# Patient Record
Sex: Female | Born: 1970 | Race: White | Hispanic: No | Smoking: Former smoker
Health system: Southern US, Community
[De-identification: ages and names within clinical notes are randomized; demographics above are authoritative.]

## PROBLEM LIST (undated history)

## (undated) DIAGNOSIS — K219 Gastro-esophageal reflux disease without esophagitis: Secondary | ICD-10-CM

## (undated) DIAGNOSIS — F3181 Bipolar II disorder: Secondary | ICD-10-CM

## (undated) DIAGNOSIS — F32A Depression, unspecified: Secondary | ICD-10-CM

## (undated) DIAGNOSIS — I493 Ventricular premature depolarization: Secondary | ICD-10-CM

## (undated) DIAGNOSIS — J45909 Unspecified asthma, uncomplicated: Secondary | ICD-10-CM

## (undated) DIAGNOSIS — F419 Anxiety disorder, unspecified: Secondary | ICD-10-CM

## (undated) DIAGNOSIS — R002 Palpitations: Secondary | ICD-10-CM

## (undated) DIAGNOSIS — F329 Major depressive disorder, single episode, unspecified: Secondary | ICD-10-CM

## (undated) HISTORY — DX: Unspecified asthma, uncomplicated: J45.909

## (undated) HISTORY — PX: ABDOMINAL HYSTERECTOMY: SHX81

## (undated) HISTORY — DX: Bipolar II disorder: F31.81

## (undated) HISTORY — PX: TOTAL ABDOMINAL HYSTERECTOMY: SHX209

## (undated) HISTORY — DX: Gastro-esophageal reflux disease without esophagitis: K21.9

## (undated) HISTORY — PX: APPENDECTOMY: SHX54

## (undated) HISTORY — DX: Palpitations: R00.2

---

## 2012-04-13 ENCOUNTER — Emergency Department (HOSPITAL_COMMUNITY): Payer: 59

## 2012-04-13 ENCOUNTER — Other Ambulatory Visit: Payer: Self-pay

## 2012-04-13 ENCOUNTER — Emergency Department (HOSPITAL_COMMUNITY)
Admission: EM | Admit: 2012-04-13 | Discharge: 2012-04-13 | Disposition: A | Discharge status: Home / Self Care | Payer: 59 | Attending: Emergency Medicine | Admitting: Emergency Medicine

## 2012-04-13 ENCOUNTER — Encounter (HOSPITAL_COMMUNITY): Payer: Self-pay | Admitting: Emergency Medicine

## 2012-04-13 DIAGNOSIS — I10 Essential (primary) hypertension: Secondary | ICD-10-CM | POA: Insufficient documentation

## 2012-04-13 DIAGNOSIS — F3289 Other specified depressive episodes: Secondary | ICD-10-CM | POA: Insufficient documentation

## 2012-04-13 DIAGNOSIS — F419 Anxiety disorder, unspecified: Secondary | ICD-10-CM

## 2012-04-13 DIAGNOSIS — F329 Major depressive disorder, single episode, unspecified: Secondary | ICD-10-CM | POA: Insufficient documentation

## 2012-04-13 DIAGNOSIS — F411 Generalized anxiety disorder: Secondary | ICD-10-CM | POA: Insufficient documentation

## 2012-04-13 DIAGNOSIS — R0789 Other chest pain: Secondary | ICD-10-CM | POA: Insufficient documentation

## 2012-04-13 HISTORY — DX: Anxiety disorder, unspecified: F41.9

## 2012-04-13 HISTORY — DX: Depression, unspecified: F32.A

## 2012-04-13 HISTORY — DX: Major depressive disorder, single episode, unspecified: F32.9

## 2012-04-13 HISTORY — DX: Ventricular premature depolarization: I49.3

## 2012-04-13 LAB — URINALYSIS, ROUTINE W REFLEX MICROSCOPIC
Bilirubin Urine: NEGATIVE
Glucose, UA: NEGATIVE mg/dL
Hgb urine dipstick: NEGATIVE
Ketones, ur: NEGATIVE mg/dL
Leukocytes, UA: NEGATIVE
Nitrite: NEGATIVE
Protein, ur: NEGATIVE mg/dL
Specific Gravity, Urine: 1.015 (ref 1.005–1.030)
Urobilinogen, UA: 0.2 mg/dL (ref 0.0–1.0)
pH: 6.5 (ref 5.0–8.0)

## 2012-04-13 LAB — CBC WITH DIFFERENTIAL/PLATELET
Basophils Absolute: 0 10*3/uL (ref 0.0–0.1)
Basophils Relative: 0 % (ref 0–1)
Eosinophils Absolute: 0.1 10*3/uL (ref 0.0–0.7)
Eosinophils Relative: 1 % (ref 0–5)
HCT: 37.6 % (ref 36.0–46.0)
Hemoglobin: 13.2 g/dL (ref 12.0–15.0)
Lymphocytes Relative: 24 % (ref 12–46)
Lymphs Abs: 1.7 10*3/uL (ref 0.7–4.0)
MCH: 28.7 pg (ref 26.0–34.0)
MCHC: 35.1 g/dL (ref 30.0–36.0)
MCV: 81.7 fL (ref 78.0–100.0)
Monocytes Absolute: 0.5 10*3/uL (ref 0.1–1.0)
Monocytes Relative: 6 % (ref 3–12)
Neutro Abs: 4.8 10*3/uL (ref 1.7–7.7)
Neutrophils Relative %: 69 % (ref 43–77)
Platelets: 221 10*3/uL (ref 150–400)
RBC: 4.6 MIL/uL (ref 3.87–5.11)
RDW: 14.4 % (ref 11.5–15.5)
WBC: 7.1 10*3/uL (ref 4.0–10.5)

## 2012-04-13 LAB — BASIC METABOLIC PANEL
BUN: 6 mg/dL (ref 6–23)
CO2: 22 mEq/L (ref 19–32)
Calcium: 9.1 mg/dL (ref 8.4–10.5)
Chloride: 103 mEq/L (ref 96–112)
Creatinine, Ser: 0.76 mg/dL (ref 0.50–1.10)
GFR calc Af Amer: >90 mL/min (ref >90)
GFR calc non Af Amer: >90 mL/min (ref >90)
Glucose, Bld: 87 mg/dL (ref 70–99)
Potassium: 3.4 mEq/L — ABNORMAL LOW (ref 3.5–5.1)
Sodium: 135 mEq/L (ref 135–145)

## 2012-04-13 LAB — TROPONIN I: Troponin I: <0.3 ng/mL (ref <0.30)

## 2012-04-13 MED ORDER — LORAZEPAM 1 MG PO TABS
1.0000 mg | ORAL_TABLET | Freq: Once | ORAL | Status: AC
  Administered 2012-04-13: 1 mg via ORAL
  Filled 2012-04-13: qty 1

## 2012-04-13 MED ORDER — LORAZEPAM 1 MG PO TABS: ORAL_TABLET | ORAL | Status: DC

## 2012-04-13 NOTE — ED Notes (Signed)
Pt c/o left chest pain x one hour ago. Pt seen PcP for the same today.

## 2012-04-13 NOTE — ED Provider Notes (Signed)
History     CSN: [id number]  Arrival date & time 04/13/12  1744   First MD Initiated Contact with Patient 04/13/12 1827      Chief Complaint  Patient presents with  . Chest Pain     HPI Pt was seen at 1850.  Per pt, c/o gradual onset and persistence of constant acute flair of her chronic anxiety for the past 3 weeks, worse over the past several hours.  Pt states she was eval by her PMD in the ofc today for anxiety/depression due to multiple recent stressors in the past 3 weeks (work, death of family member).  States she was told her BP was "elevated" ("170's/90's"), and was restarted on anti-depressant medications (self d/c'd her previous meds for same approx 1 year ago).  Pt states she also "was patted on the head and told I was ok."  Pt states this "aggravated" her and she became "more upset" and tearful after she left the office.  States she developed chest pain "thinking about all that and the fact he told me my BP was high."  Denies palpitations, no cough/SOB, no abd pain, no N/V/D, no back pain, no fevers, no injury.      Past Medical History  Diagnosis Date  . Anxiety   . Depression   . PVC (premature ventricular contraction)     Past Surgical History  Procedure Date  . Appendectomy   . Abdominal hysterectomy     History  Substance Use Topics  . Smoking status: Not on file  . Smokeless tobacco: Not on file  . Alcohol Use: No    Review of Systems ROS: Statement: All systems negative except as marked or noted in the HPI; Constitutional: Negative for fever and chills. ; ; Eyes: Negative for eye pain, redness and discharge. ; ; ENMT: Negative for ear pain, hoarseness, nasal congestion, sinus pressure and sore throat. ; ; Cardiovascular: +CP. Negative for palpitations, diaphoresis, dyspnea and peripheral edema. ; ; Respiratory: Negative for cough, wheezing and stridor. ; ; Gastrointestinal: Negative for nausea, vomiting, diarrhea, abdominal pain, blood in stool, hematemesis,  jaundice and rectal bleeding. . ; ; Genitourinary: Negative for dysuria, flank pain and hematuria. ; ; Musculoskeletal: Negative for back pain and neck pain. Negative for swelling and trauma.; ; Skin: Negative for pruritus, rash, abrasions, blisters, bruising and skin lesion.; ; Neuro: Negative for headache, lightheadedness and neck stiffness. Negative for weakness, altered level of consciousness , altered mental status, extremity weakness, paresthesias, involuntary movement, seizure and syncope.; Psych:  +anxiety, depression. No SI, no SA, no HI, no hallucinations.       Allergies  Erythromycin and Tetracyclines & related  Home Medications   Current Outpatient Rx  Name Route Sig Dispense Refill  . ARIPIPRAZOLE 2 MG PO TABS Oral Take 2 mg by mouth daily.    Marland Kitchen FLUOXETINE HCL 40 MG PO CAPS Oral Take 40 mg by mouth daily.    Marland Kitchen HYDROXYCHLOROQUINE SULFATE 200 MG PO TABS Oral Take 400 mg by mouth daily.    Marland Kitchen RANITIDINE HCL 150 MG PO CAPS Oral Take 150 mg by mouth daily.    Marland Kitchen LORAZEPAM 1 MG PO TABS  One Half to 1 tablet PO BID prn anxiety 6 tablet 0    BP 175/87  Pulse 98  Temp 98.6 F (37 C) (Oral)  Resp 22  Ht 5\' 4"  (1.626 m)  Wt 194 lb (87.998 kg)  BMI 33.30 kg/m2  SpO2 100%  Physical Exam 1855: Physical examination:  Nursing notes reviewed; Vital signs and O2 SAT reviewed;  Constitutional: Well developed, Well nourished, Well hydrated, In no acute distress; Head:  Normocephalic, atraumatic; Eyes: EOMI, PERRL, No scleral icterus; ENMT: Mouth and pharynx normal, Mucous membranes moist; Neck: Supple, Full range of motion, No lymphadenopathy; Cardiovascular: Regular rate and rhythm, No murmur, rub, or gallop; Respiratory: Breath sounds clear & equal bilaterally, No rales, rhonchi, wheezes.  Speaking full sentences with ease, Normal respiratory effort/excursion; Chest: Nontender, Movement normal; Abdomen: Soft, Nontender, Nondistended, Normal bowel sounds; Extremities: Pulses normal, No  tenderness, No edema, No calf edema or asymmetry.; Neuro: AA&Ox3, Major CN grossly intact.  Speech clear. No gross focal motor or sensory deficits in extremities.; Skin: Color normal, Warm, Dry.; Psych:  Anxious, tearful/crying.    ED Course  Procedures    MDM  MDM Reviewed: nursing note and vitals Interpretation: labs, x-ray and ECG      Date: 04/13/2012  Rate: 96  Rhythm: normal sinus rhythm  QRS Axis: normal  Intervals: normal  ST/T Wave abnormalities: normal  Conduction Disutrbances:none  Narrative Interpretation:   Old EKG Reviewed: none available.  Results for orders placed during the hospital encounter of 04/13/12  TROPONIN I      Component Value Range   Troponin I <0.30  <0.30 ng/mL  BASIC METABOLIC PANEL      Component Value Range   Sodium 135  135 - 145 mEq/L   Potassium 3.4 (*) 3.5 - 5.1 mEq/L   Chloride 103  96 - 112 mEq/L   CO2 22  19 - 32 mEq/L   Glucose, Bld 87  70 - 99 mg/dL   BUN 6  6 - 23 mg/dL   Creatinine, Ser 1.61  0.50 - 1.10 mg/dL   Calcium 9.1  8.4 - 09.6 mg/dL   GFR calc non Af Amer >90  >90 mL/min   GFR calc Af Amer >90  >90 mL/min  CBC WITH DIFFERENTIAL      Component Value Range   WBC 7.1  4.0 - 10.5 K/uL   RBC 4.60  3.87 - 5.11 MIL/uL   Hemoglobin 13.2  12.0 - 15.0 g/dL   HCT 04.5  40.9 - 81.1 %   MCV 81.7  78.0 - 100.0 fL   MCH 28.7  26.0 - 34.0 pg   MCHC 35.1  30.0 - 36.0 g/dL   RDW 91.4  78.2 - 95.6 %   Platelets 221  150 - 400 K/uL   Neutrophils Relative 69  43 - 77 %   Neutro Abs 4.8  1.7 - 7.7 K/uL   Lymphocytes Relative 24  12 - 46 %   Lymphs Abs 1.7  0.7 - 4.0 K/uL   Monocytes Relative 6  3 - 12 %   Monocytes Absolute 0.5  0.1 - 1.0 K/uL   Eosinophils Relative 1  0 - 5 %   Eosinophils Absolute 0.1  0.0 - 0.7 K/uL   Basophils Relative 0  0 - 1 %   Basophils Absolute 0.0  0.0 - 0.1 K/uL  URINALYSIS, ROUTINE W REFLEX MICROSCOPIC      Component Value Range   Color, Urine YELLOW  YELLOW   APPearance CLEAR  CLEAR    Specific Gravity, Urine 1.015  1.005 - 1.030   pH 6.5  5.0 - 8.0   Glucose, UA NEGATIVE  NEGATIVE mg/dL   Hgb urine dipstick NEGATIVE  NEGATIVE   Bilirubin Urine NEGATIVE  NEGATIVE   Ketones, ur NEGATIVE  NEGATIVE mg/dL  Protein, ur NEGATIVE  NEGATIVE mg/dL   Urobilinogen, UA 0.2  0.0 - 1.0 mg/dL   Nitrite NEGATIVE  NEGATIVE   Leukocytes, UA NEGATIVE  NEGATIVE   Dg Chest 2 View 04/13/2012  *RADIOLOGY REPORT*  Clinical Data: 41 year old female chest pain.  CHEST - 2 VIEW  Comparison: None.  Findings: Normal lung volumes. Normal cardiac size and mediastinal contours.  Visualized tracheal air column is within normal limits. Lungs are clear.  No pneumothorax or pleural effusion. No acute osseous abnormality identified.  IMPRESSION: Negative, no acute cardiopulmonary abnormality.   Original Report Authenticated By: Harley Hallmark, M.D.       2100:  Feels improved after ativan and wants to go home now.  No longer tearful and crying.  PERC negative.  Doubt ACS or PE as cause for symptoms; as symptoms seem temporally related to "getting upset" and crying.  Long d/w pt and family regarding dx of HTN and its management; verb understanding. Dx testing d/w pt and family.  Questions answered.  Verb understanding, agreeable to d/c home with outpt f/u.         Laray Anger, DO 04/16/12 1128

## 2012-04-13 NOTE — ED Notes (Signed)
Pt alert & oriented x4, stable gait. Patient given discharge instructions, paperwork & prescription(s). Patient  instructed to stop at the registration desk to finish any additional paperwork. Patient verbalized understanding. Pt left department w/ no further questions. 

## 2012-04-13 NOTE — ED Notes (Signed)
Pt presents to ed with c/o left side chest pain, pt states that she was seen in pcp office today for follow up with anxiety and depression, pt states that she used to be on anxiety medication, stopped taking it about a year and half ago, began to experience symptoms of anxiety and depression about 3 weeks ago due to stress at work and recent death of an uncle last week, states that she is upset because she knows her mother and aunts will be next. Pt states that her  Blood pressure was elevated in dr office today and dr. Catalina Pizza her that she would be fine, after leaving the dr. Isidore Moos pt began to experience left side chest pain, anxiety due to elevated blood pressure and drove herself to er.

## 2012-10-22 ENCOUNTER — Emergency Department: Payer: Self-pay | Admitting: Emergency Medicine

## 2012-10-22 LAB — URINALYSIS, COMPLETE
Bilirubin,UR: NEGATIVE
Blood: NEGATIVE
Glucose,UR: NEGATIVE mg/dL (ref 0–75)
Ketone: NEGATIVE
Leukocyte Esterase: NEGATIVE
Nitrite: NEGATIVE
Ph: 6 (ref 4.5–8.0)
Protein: NEGATIVE
RBC,UR: 1 /HPF (ref 0–5)
Specific Gravity: 1.014 (ref 1.003–1.030)
Squamous Epithelial: NONE SEEN
WBC UR: 1 /HPF (ref 0–5)

## 2013-05-10 ENCOUNTER — Ambulatory Visit: Payer: Self-pay | Admitting: Podiatry

## 2013-08-03 ENCOUNTER — Emergency Department: Payer: Self-pay | Admitting: Emergency Medicine

## 2013-08-03 LAB — URINALYSIS, COMPLETE
Bilirubin,UR: NEGATIVE
Blood: NEGATIVE
Glucose,UR: NEGATIVE mg/dL (ref 0–75)
Ketone: NEGATIVE
Nitrite: NEGATIVE
Ph: 6 (ref 4.5–8.0)
Protein: NEGATIVE
RBC,UR: 4 /HPF (ref 0–5)
Specific Gravity: 1.009 (ref 1.003–1.030)
Squamous Epithelial: 1
WBC UR: 198 /HPF (ref 0–5)

## 2013-11-13 ENCOUNTER — Ambulatory Visit: Payer: Self-pay

## 2015-02-04 ENCOUNTER — Telehealth: Payer: Self-pay

## 2015-02-04 NOTE — Telephone Encounter (Signed)
Patient called stating that her insurance is now requiring a prior authorization for her Latuda medication. Practice partner number is [phone]. Would you like to change the medication to something else?

## 2015-02-04 NOTE — Telephone Encounter (Signed)
We will need to do the prior auth as she has been on many medications

## 2015-02-06 NOTE — Telephone Encounter (Signed)
Called and let patient know we were starting the prior auth.

## 2015-02-21 ENCOUNTER — Telehealth: Payer: Self-pay | Admitting: Unknown Physician Specialty

## 2015-02-21 NOTE — Telephone Encounter (Signed)
Disregard

## 2015-03-04 ENCOUNTER — Telehealth: Payer: Self-pay | Admitting: Unknown Physician Specialty

## 2015-03-05 NOTE — Telephone Encounter (Signed)
disregard

## 2015-05-26 ENCOUNTER — Encounter: Payer: Self-pay | Admitting: Unknown Physician Specialty

## 2015-05-26 DIAGNOSIS — R002 Palpitations: Secondary | ICD-10-CM | POA: Insufficient documentation

## 2015-05-26 DIAGNOSIS — J45909 Unspecified asthma, uncomplicated: Secondary | ICD-10-CM | POA: Insufficient documentation

## 2015-05-26 DIAGNOSIS — G4700 Insomnia, unspecified: Secondary | ICD-10-CM

## 2015-05-26 DIAGNOSIS — F3181 Bipolar II disorder: Secondary | ICD-10-CM

## 2015-05-26 DIAGNOSIS — F419 Anxiety disorder, unspecified: Secondary | ICD-10-CM | POA: Insufficient documentation

## 2015-05-26 DIAGNOSIS — E559 Vitamin D deficiency, unspecified: Secondary | ICD-10-CM

## 2015-05-27 ENCOUNTER — Encounter: Payer: Self-pay | Admitting: Unknown Physician Specialty

## 2015-05-27 ENCOUNTER — Ambulatory Visit (INDEPENDENT_AMBULATORY_CARE_PROVIDER_SITE_OTHER): Payer: Managed Care, Other (non HMO) | Admitting: Unknown Physician Specialty

## 2015-05-27 VITALS — BP 118/67 | HR 76 | Temp 98.3°F | Ht 63.2 in | Wt 236.0 lb

## 2015-05-27 DIAGNOSIS — F3181 Bipolar II disorder: Secondary | ICD-10-CM

## 2015-05-27 DIAGNOSIS — E669 Obesity, unspecified: Secondary | ICD-10-CM | POA: Diagnosis not present

## 2015-05-27 DIAGNOSIS — E559 Vitamin D deficiency, unspecified: Secondary | ICD-10-CM

## 2015-05-27 DIAGNOSIS — M25511 Pain in right shoulder: Secondary | ICD-10-CM

## 2015-05-27 DIAGNOSIS — Z0000 Encounter for general adult medical examination without abnormal findings: Secondary | ICD-10-CM | POA: Diagnosis not present

## 2015-05-27 DIAGNOSIS — M25512 Pain in left shoulder: Secondary | ICD-10-CM | POA: Diagnosis not present

## 2015-05-27 MED ORDER — LURASIDONE HCL 20 MG PO TABS: 20.0000 mg | ORAL_TABLET | Freq: Every day | ORAL | Status: DC

## 2015-05-27 MED ORDER — FLUOXETINE HCL 40 MG PO CAPS: 40.0000 mg | ORAL_CAPSULE | Freq: Every day | ORAL | Status: DC

## 2015-05-27 MED ORDER — BUPROPION HCL ER (XL) 300 MG PO TB24: 300.0000 mg | ORAL_TABLET | Freq: Every day | ORAL | Status: DC

## 2015-05-27 MED ORDER — LORAZEPAM 1 MG PO TABS: ORAL_TABLET | ORAL | Status: DC

## 2015-05-27 MED ORDER — METOPROLOL SUCCINATE ER 50 MG PO TB24: 50.0000 mg | ORAL_TABLET | Freq: Every day | ORAL | Status: DC

## 2015-05-27 NOTE — Assessment & Plan Note (Signed)
Improving.  Increase Vit D

## 2015-05-27 NOTE — Patient Instructions (Signed)
Please do call to schedule your mammogram; the number to schedule one at either [facility] or [facility] is [phone]    Think you're too busy to work out? We have the workout for you. In minutes, high-intensity interval training (H.I.I.T.) will have you sweating, breathing hard and maximizing the health benefits of exercise without the time commitment. Best of all, it's scientifically proven to work.  What Is H.I.I.T.? SHORT WORKOUTS 101 High-intensity interval training - referred to as H.I.I.T. - is based on the idea that short bursts of strenuous exercise can have a big impact on the body. If moderate exercise - like a 20-minute jog - is good for your heart, lungs and metabolism, H.I.I.T. packs the benefits of that workout and more into a few minutes. It may sound too good to be true, but learning this exercise technique and adapting it to your life can mean saving hours at the gym. If you think you don't have time to exercise, H.I.I.T. may be the workout for you.  You can try it with any aerobic activity you like. The principles of H.I.I.T. can be applied to running, biking, stair climbing, swimming, jumping rope, rowing, even hopping or skipping. (Yes, skipping!)  The downside? Even though H.I.I.T. lasts only minutes, the workouts are tough, requiring you to push your body near its limit.  HOW INTENSE IS HIGH INTENSITY? High-intensity exercise is obviously not a casual stroll down the street, but it's not a run-till-your-lungs-pop explosion, either. Think breathless, not winded. Heart-pounding, not exploding. Legs pumping, but not uncontrolled.  You don't need any fancy heart rate monitors to do these workouts. Use cues from your body as a guide. In the middle of a high-intensity workout you should be able to say single words, but not complete whole sentences. So, if you can keep chatting to your workout partner during this workout, pump it up a few  notches.  05-07-29 Training This simple program will help you make the most of a short workout by improving heart health and endurance. Try it with your favorite cardiovascular activity. The essentials of 05-07-29 training are simple. Run, ride or perhaps row on a rowing machine gently for 30 seconds, accelerate to a moderate pace for 20 seconds, then sprint as hard as you can for 10 seconds. (It should be called 30-20-10 training, obviously, but that is not as catchy.) Repeat.  You don't even need a stopwatch to monitor the 30-, 20-, and 10-second time changes. You can just count to yourself, which seems to make the intervals pass more quickly.  Best of all? The grueling, all-out portion of the workout lasts for only 10 seconds. C'mon, you can do anything for 10 seconds, right?  Got 10 Minutes? A solitary minute of hard work buried in 10 minutes of activity can make a big difference.  The 10-Minute Workout If you like to run, bike, row or swim - just a little bit - this workout is a great option for you. Step 1 Warm up for 2 minutes Step 2 Pedal, run or swim all-out for 20 seconds. Repeat 2 more times Warm down for 3 minutes    GET STARTED To benefit the most from really, really short workouts, you need to build the habit of doing them into your hectic life. Ideally, you'll complete the workout three times a week. The best way to build that habit is to start small and be willing to tweak your schedule where you can to accommodate  your new workout.  First set up a spot in your house for your workout, equipped with whatever you need to get the job done: sneakers, a chair, a towel, etc. Then slot your workout in before you would normally shower. (You can even do it in the bathroom.) Or wake up five minutes earlier and do it first thing in the morning, so you can head off to work feeling accomplished. Or do it during your lunch hour. Run up your office's stairs or grab a private conference room  for just a few minutes. Or work it into your commute. If you walk or bike to work, add some heavy intervals on the way home.  GET A BOOST FROM MUSIC Creating a workout playlist of high-energy tunes you love will not make your workout feel easier, but it may cause you to exercise harder without even realizing it. Best of all, if you are doing a really short workout, you need only one or two great tunes to get you through. If you are willing to try something a bit different, make your own music as you exercise. Sing, hum, clap your hands, whatever you can do to jam along to your playlist. It may give you an extra boost to finish strong.  Find a song or podcast that's the length of your really, really short workout. By the time the song is over, you're done.  Excerpted from the [place] [employer] Well column http://www.nytimes.com/well/guides/really-really-short-workouts?smid=fb-nytwell&smtyp=pay

## 2015-05-27 NOTE — Assessment & Plan Note (Signed)
Stable.  [facility]

## 2015-05-27 NOTE — Assessment & Plan Note (Addendum)
Discussed Exercise and handout given for HITT training.  She will try to give up cookies.

## 2015-05-27 NOTE — Progress Notes (Signed)
-----  BP 118/67 mmHg  Pulse 76  Temp(Src) 98.3 F (36.8 C)  Ht 5' 3.2" (1.605 m)  Wt 236 lb (107.049 kg)  BMI 41.56 kg/m2  SpO2 96%  LMP  (LMP Unknown)   Subjective:    Patient ID: Tracie James, female    DOB: 13-May-1971, 44 y.o.   MRN: [id number]  HPI: Tracie James is a 44 y.o. female  Chief Complaint  Patient presents with  . Annual Exam   Bipolar depression Pt needs prior authorization for Latuda.  Pt has tried a number of therapies in the past.  This seems to have worked the best.  She has tried and failed Cymbalta, Zoloft, Lithium, Abilify, Seroquel..  She is currently taking Wellbutrin along with the Latuda and Prozac.  She has 2 1/2 weeks and needs more.    Relevant past medical, surgical, family and social history reviewed and updated as indicated. Interim medical history since our last visit reviewed. Allergies and medications reviewed and updated.  Review of Systems  Constitutional: Negative.   HENT: Negative.   Eyes: Negative.   Respiratory: Negative.   Cardiovascular: Negative.   Gastrointestinal: Negative.   Endocrine: Negative.   Genitourinary: Negative.   Musculoskeletal:       Pain in both shoulders and bilateral hips  Skin: Negative.   Allergic/Immunologic: Negative.   Neurological: Negative.   Hematological: Negative.   Psychiatric/Behavioral: Negative.     Per HPI unless specifically indicated above     Objective:    BP 118/67 mmHg  Pulse 76  Temp(Src) 98.3 F (36.8 C)  Ht 5' 3.2" (1.605 m)  Wt 236 lb (107.049 kg)  BMI 41.56 kg/m2  SpO2 96%  LMP  (LMP Unknown)  Wt Readings from Last 3 Encounters:  05/27/15 236 lb (107.049 kg)  12/13/14 229 lb (103.874 kg)  04/13/12 194 lb (87.998 kg)    Physical Exam  Constitutional: She is oriented to person, place, and time. She appears well-developed and well-nourished.  HENT:  Head: Normocephalic and atraumatic.  Eyes: Pupils are equal, round, and reactive to light. Right eye exhibits  no discharge. Left eye exhibits no discharge. No scleral icterus.  Neck: Normal range of motion. Neck supple. Carotid bruit is not present. No thyromegaly present.  Cardiovascular: Normal rate, regular rhythm and normal heart sounds.  Exam reveals no gallop and no friction rub.   No murmur heard. Pulmonary/Chest: Effort normal and breath sounds normal. No respiratory distress. She has no wheezes. She has no rales.  Abdominal: Soft. Bowel sounds are normal. There is no tenderness. There is no rebound.  Genitourinary: No breast swelling, tenderness or discharge.  Musculoskeletal: Normal range of motion.  Lymphadenopathy:    She has no cervical adenopathy.  Neurological: She is alert and oriented to person, place, and time.  Skin: Skin is warm, dry and intact. No rash noted.  Psychiatric: She has a normal mood and affect. Her speech is normal and behavior is normal. Judgment and thought content normal. Cognition and memory are normal.   Labs from an outside source scanned into chart and reviewed.  All labs normal except Vitamin D of 28 and taking 2K vitamin D/day  Results for orders placed or performed in visit on 08/03/13  Urinalysis, Complete  Result Value Ref Range   Color - urine Yellow    Clarity - urine Hazy    Glucose,UR Negative 0-75 mg/dL   Bilirubin,UR Negative NEGATIVE   Ketone Negative NEGATIVE   Specific Gravity 1.009 1.003-1.030  Blood Negative NEGATIVE   Ph 6.0 4.5-8.0   Protein Negative NEGATIVE   Nitrite Negative NEGATIVE   Leukocyte Esterase 3+ NEGATIVE   RBC,UR 4 /HPF 0-5 /HPF   WBC UR 198 /HPF 0-5 /HPF   Bacteria TRACE NONE SEEN   Squamous Epithelial 1 /HPF    Mucous PRESENT       Assessment & Plan:   Problem List Items Addressed This Visit      Unprioritized   Bipolar 2 disorder (HCC)    Stable.  Needs prior authorization for Latuda      Vitamin D deficiency - Primary    Improving.  Increase Vit D      Obesity    Discussed Exercise and handout  given for HITT training.  She will try to give up cookies.         Other Visit Diagnoses    Annual physical exam        Relevant Orders    MM DIGITAL SCREENING BILATERAL    Shoulder pain, bilateral        Refer to PT        Follow up plan: Return in about 6 months (around 11/24/2015).

## 2015-07-02 ENCOUNTER — Telehealth: Payer: Self-pay | Admitting: Unknown Physician Specialty

## 2015-07-02 NOTE — Telephone Encounter (Signed)
It doesn't look like I have seen her for this so I should see her to evaluate

## 2015-07-02 NOTE — Telephone Encounter (Signed)
Pt called stated she is still experiencing pain in her hips has taken motrin and aleve they have done nothing for her. Please call pt with different options. Thanks.

## 2015-07-02 NOTE — Telephone Encounter (Signed)
Routing to provider. What can I tell her?

## 2015-07-02 NOTE — Telephone Encounter (Signed)
Called to schedule patient an appointment and patient stated she was just here 3 weeks ago. I put the patient on hold and spoke to Waikeleheryl about it and Elnita MaxwellCheryl said because her last visit was not focused on her hip pain, she would like to see her for just that. I gave the patient this information and she stated that she would call back later to make an appointment.

## 2015-07-08 ENCOUNTER — Other Ambulatory Visit: Payer: Self-pay | Admitting: Unknown Physician Specialty

## 2015-11-12 ENCOUNTER — Encounter: Payer: Self-pay | Admitting: Family Medicine

## 2015-11-12 ENCOUNTER — Ambulatory Visit (INDEPENDENT_AMBULATORY_CARE_PROVIDER_SITE_OTHER): Payer: Managed Care, Other (non HMO) | Admitting: Family Medicine

## 2015-11-12 VITALS — BP 122/73 | HR 80 | Temp 98.0°F | Ht 63.0 in | Wt 233.0 lb

## 2015-11-12 DIAGNOSIS — M549 Dorsalgia, unspecified: Secondary | ICD-10-CM | POA: Diagnosis not present

## 2015-11-12 DIAGNOSIS — F3181 Bipolar II disorder: Secondary | ICD-10-CM | POA: Diagnosis not present

## 2015-11-12 NOTE — Progress Notes (Signed)
BP 122/73 mmHg  Pulse 80  Temp(Src) 98 F (36.7 C)  Ht  (1.6 m)  Wt 233 lb (105.688 kg)  BMI 41.28 kg/m2  SpO2 98%  LMP  (LMP Unknown)   Subjective:    Patient ID: Tracie James, female    DOB: [birth year], 45 y.o.   MRN: [id number]  HPI: Tracie James is a 45 y.o. female  Chief Complaint  Patient presents with  . 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  Relevant past medical, surgical, family and social history reviewed and updated as indicated. Interim medical history since our last visit reviewed. Allergies and medications reviewed and updated.  Review of Systems  Constitutional: Negative.   Respiratory: Negative.   Cardiovascular: Negative.     Per HPI unless specifically indicated above     Objective:    BP 122/73 mmHg  Pulse 80  Temp(Src) 98 F (36.7 C)  Ht  (1.6 m)  Wt 233 lb (105.688 kg)  BMI 41.28 kg/m2  SpO2 98%  LMP  (LMP Unknown)  Wt Readings from Last 3 Encounters:  11/12/15 233 lb (105.688 kg)  05/27/15 236 lb (107.049 kg)  12/13/14 229 lb (103.874 kg)    Physical Exam  Constitutional: She is oriented to person, place, and time. She appears well-developed and well-nourished. No distress.  HENT:  Head: Normocephalic and atraumatic.  Right Ear: Hearing normal.  Left Ear: Hearing normal.  Nose: Nose normal.  Eyes: Conjunctivae and lids are normal. Right eye exhibits no discharge. Left eye exhibits no discharge. No scleral icterus.  Pulmonary/Chest: Effort normal. No respiratory distress.  Musculoskeletal: Normal range of motion.  Trigger point identified in left  medial trapezoid area  Neurological: She is alert and oriented to person, place, and time.  Skin: Skin is intact. No rash noted.  Psychiatric: She has a normal mood and affect. Her speech is normal and behavior is normal. Judgment and thought content normal. Cognition and memory are normal.    Results for orders placed or performed in visit on 08/03/13  Urinalysis, Complete  Result Value Ref Range   Color - urine Yellow    Clarity - urine Hazy    Glucose,UR Negative 0-75 mg/dL   Bilirubin,UR Negative NEGATIVE   Ketone Negative NEGATIVE   Specific Gravity 1.009 1.003-1.030   Blood Negative NEGATIVE   Ph 6.0 4.5-8.0   Protein Negative NEGATIVE   Nitrite Negative NEGATIVE   Leukocyte Esterase 3+ NEGATIVE   RBC,UR 4 /HPF 0-5 /HPF   WBC UR 198 /HPF 0-5 /HPF   Bacteria TRACE NONE SEEN   Squamous Epithelial 1 /HPF    Mucous PRESENT       Assessment & Plan:   Problem List Items Addressed This Visit      Other   Bipolar 2 disorder (HCC) - Primary    The current medical regimen is effective;  continue present plan and medications.        Other Visit Diagnoses    Trigger point with back pain        Discussed trigger point care and treatment massage Advil and Aleve, position change with  work        Follow up plan: Return if symptoms worsen or fail to improve, for As scheduled.

## 2015-11-12 NOTE — Assessment & Plan Note (Signed)
The current medical regimen is effective;  continue present plan and medications.  

## 2015-11-14 ENCOUNTER — Telehealth: Payer: Self-pay | Admitting: Unknown Physician Specialty

## 2015-11-14 MED ORDER — METHOCARBAMOL 750 MG PO TABS: 750.0000 mg | ORAL_TABLET | Freq: Four times a day (QID) | ORAL | Status: DC

## 2015-11-14 NOTE — Telephone Encounter (Signed)
Routing to provider  

## 2015-11-14 NOTE — Telephone Encounter (Signed)
I will call in Robaxin

## 2015-11-14 NOTE — Telephone Encounter (Signed)
Pt called and stated that she was having really bad muscle spasms in shoulders and arm and she has already came in to see MAC this week. She would like to have something called in to [facility].

## 2015-11-24 ENCOUNTER — Ambulatory Visit: Payer: Managed Care, Other (non HMO) | Admitting: Unknown Physician Specialty

## 2016-01-21 ENCOUNTER — Encounter: Payer: Self-pay | Admitting: Unknown Physician Specialty

## 2016-01-21 ENCOUNTER — Ambulatory Visit (INDEPENDENT_AMBULATORY_CARE_PROVIDER_SITE_OTHER): Payer: Managed Care, Other (non HMO) | Admitting: Unknown Physician Specialty

## 2016-01-21 VITALS — BP 128/80 | HR 79 | Temp 97.7°F | Ht 64.0 in | Wt 232.4 lb

## 2016-01-21 DIAGNOSIS — F3181 Bipolar II disorder: Secondary | ICD-10-CM

## 2016-01-21 DIAGNOSIS — M7582 Other shoulder lesions, left shoulder: Secondary | ICD-10-CM | POA: Insufficient documentation

## 2016-01-21 NOTE — Assessment & Plan Note (Signed)
Pt will seek employee assistance counseling.

## 2016-01-21 NOTE — Assessment & Plan Note (Signed)
Refer to PT for further treatment and evaluations.

## 2016-01-21 NOTE — Progress Notes (Signed)
-----  BP 128/80 mmHg  Pulse 79  Temp(Src) 97.7 F (36.5 C)  Ht 5\' 4"  (1.626 m)  Wt 232 lb 6.4 oz (105.416 kg)  BMI 39.87 kg/m2  SpO2 98%  LMP  (LMP Unknown)   Subjective:    Patient ID: Tracie ClaymanAimee James, female    DOB: 11/08/1970, 45 y.o.   MRN: [id number]  HPI: Tracie James is a 45 y.o. female  Chief Complaint  Patient presents with  . Follow-up    pt states she is here for med f/up  . Shoulder Pain    pt states her left shoulder has been hurting her for about 6 months now    Bipolar depression Pt needs refills for [place] Latuda.  Pt has tried a number of therapies in the past.  This seems to have worked the best.  She has tried and failed Cymbalta, Zoloft, Lithium, Abilify, Seroquel..  She is currently taking Wellbutrin along with the Latuda and Prozac.  She has 2 1/2 weeks and needs more. States depression is worse as her kids are at their father's.    [facility] 01/21/2016 05/27/2015  Decreased Interest 3 3  Down, Depressed, Hopeless 1 2  PHQ - 2 Score 4 5  Altered sleeping 3 2  Tired, decreased energy 3 3  Change in appetite 1 3  Feeling bad or failure about yourself  1 -  Trouble concentrating 1 0  Moving slowly or fidgety/restless 1 0  Suicidal thoughts 0 1  PHQ-9 Score 14 14    Shoulder pain Saw Dr. Dossie Arbourrissman for this in April for Trapezius spasm.  She states that is better but still haping pain in the front of shoulder and radiates down.  Nothing makes it better.  Unable to sleep on right side.   Does a lot of repetitive work at her job.  This pain has been going on for 6 months.   HPI  +  Relevant past medical, surgical, family and social history reviewed and updated as indicated. Interim medical history since our last visit reviewed. Allergies and medications reviewed and updated.  Review of Systems  Constitutional: Negative.   HENT: Negative.   Eyes: Negative.   Respiratory: Negative.   Cardiovascular: Negative.   Gastrointestinal:  Negative.   Endocrine: Negative.   Genitourinary: Negative.   Musculoskeletal:       Pain in both shoulders and bilateral hips  Skin: Negative.   Allergic/Immunologic: Negative.   Neurological: Negative.   Hematological: Negative.   Psychiatric/Behavioral: Negative.     Per HPI unless specifically indicated above     Objective:    BP 128/80 mmHg  Pulse 79  Temp(Src) 97.7 F (36.5 C)  Ht 5\' 4"  (1.626 m)  Wt 232 lb 6.4 oz (105.416 kg)  BMI 39.87 kg/m2  SpO2 98%  LMP  (LMP Unknown)  Wt Readings from Last 3 Encounters:  01/21/16 232 lb 6.4 oz (105.416 kg)  11/12/15 233 lb (105.688 kg)  05/27/15 236 lb (107.049 kg)    Physical Exam  Constitutional: She is oriented to person, place, and time. She appears well-developed and well-nourished.  HENT:  Head: Normocephalic and atraumatic.  Eyes: Pupils are equal, round, and reactive to light. Right eye exhibits no discharge. Left eye exhibits no discharge. No scleral icterus.  Neck: Normal range of motion. Neck supple. Carotid bruit is not present. No thyromegaly present.  Cardiovascular: Normal rate, regular rhythm and normal heart sounds.  Exam reveals no gallop and no friction rub.  No murmur heard. Pulmonary/Chest: Effort normal and breath sounds normal. No respiratory distress. She has no wheezes. She has no rales.  Abdominal: Soft. Bowel sounds are normal. There is no tenderness. There is no rebound.  Genitourinary: No breast swelling, tenderness or discharge.  Musculoskeletal: Normal range of motion.  Lymphadenopathy:    She has no cervical adenopathy.  Neurological: She is alert and oriented to person, place, and time.  Skin: Skin is warm, dry and intact. No rash noted.  Psychiatric: She has a normal mood and affect. Her speech is normal and behavior is normal. Judgment and thought content normal. Cognition and memory are normal.   Labs from an outside source scanned into chart and reviewed.  All labs normal except  Vitamin D of 28 and taking 2K vitamin D/day     Assessment & Plan:   Problem List Items Addressed This Visit      Unprioritized   Bipolar 2 disorder (HCC) - Primary    Pt will seek employee assistance counseling.        Tendonitis of left rotator cuff    Refer to PT for further treatment and evaluations.        Relevant Orders   Ambulatory referral to Physical Therapy       Follow up plan: Return in about 6 weeks (around 03/03/2016).

## 2016-02-03 ENCOUNTER — Ambulatory Visit: Payer: Managed Care, Other (non HMO) | Attending: Unknown Physician Specialty

## 2016-02-03 DIAGNOSIS — M25512 Pain in left shoulder: Secondary | ICD-10-CM | POA: Diagnosis present

## 2016-02-03 DIAGNOSIS — R293 Abnormal posture: Secondary | ICD-10-CM | POA: Diagnosis present

## 2016-02-03 NOTE — Therapy (Signed)
[facility] [address] [place], [place], [address] Phone: [phone]   Fax:  [phone]  Physical Therapy Evaluation  Patient Details  Name: Tracie James MRN: [id number] Date of Birth: 1971-07-03 Referring Provider: Gabriel Cirri, NP  Encounter Date: 02/03/2016      PT End of Session - 02/03/16 1723    Visit Number 1   Number of Visits 12   Date for PT Re-Evaluation 03/19/16   PT Start Time 0430   PT Stop Time 0510   PT Time Calculation (min) 40 min   Activity Tolerance Patient tolerated treatment well   Behavior During Therapy [facility] for tasks assessed/performed      Past Medical History  Diagnosis Date  . Anxiety   . Depression   . PVC (premature ventricular contraction)   . Bipolar 2 disorder (HCC)   . Palpitations   . Asthma   . GERD (gastroesophageal reflux disease)     Past Surgical History  Procedure Laterality Date  . Appendectomy    . Abdominal hysterectomy      There were no vitals filed for this visit.       Subjective Assessment - 02/03/16 1639    Subjective She reports pain flares up with sleeping on RT side give LT shoulder pain. Work also incr pain  with reaching forward and holding objects at work. PAin going on for 6 months   Limitations Lifting  5 gallon containers with fluid   How long can you sit comfortably? As needed   How long can you stand comfortably? AS needed   How long can you walk comfortably? As needed   Diagnostic tests No testing   Patient Stated Goals She wants to avoid surgery, pain to go away   Currently in Pain? No/denies   Pain Score --  see above for causes of pain 8/10 highest lying on RT shoulder   Pain Location Shoulder   Pain Orientation Left   Pain Descriptors / Indicators Tender;Aching  really hurts to palpation   Pain Type Chronic pain   Pain Onset More than a month ago   Pain Frequency Intermittent   Aggravating Factors  See above   Pain Relieving  Factors nothing   Multiple Pain Sites No            [facility] PT Assessment - 02/03/16 1634    Assessment   Medical Diagnosis LT RTC tendonitis   Referring Provider Gabriel Cirri, NP   Hand Dominance Right   Next MD Visit As needed   Prior Therapy No   Precautions   Precautions None   Restrictions   Weight Bearing Restrictions No   Balance Screen   Has the patient fallen in the past 6 months No   Has the patient had a decrease in activity level because of a fear of falling?  No   Is the patient reluctant to leave their home because of a fear of falling?  No   Prior Function   Level of Independence Independent   Cognition   Overall Cognitive Status Within Functional Limits for tasks assessed   ROM / Strength   AROM / PROM / Strength AROM;PROM;Strength   AROM   AROM Assessment Site Shoulder   Right/Left Shoulder Right;Left   Right Shoulder Flexion 150 Degrees   Right Shoulder ABduction 165 Degrees   Right Shoulder Internal Rotation 65 Degrees   Right Shoulder External Rotation 90 Degrees   Right Shoulder Horizontal ABduction 25 Degrees  Right Shoulder Horizontal  ADduction 105 Degrees   Left Shoulder Flexion 155 Degrees   Left Shoulder ABduction 165 Degrees   Left Shoulder Internal Rotation 65 Degrees   Left Shoulder External Rotation 90 Degrees   Left Shoulder Horizontal ABduction 25 Degrees   Left Shoulder Horizontal ADduction 101 Degrees   PROM   PROM Assessment Site Shoulder   Right/Left Shoulder Left   Strength   Strength Assessment Site Shoulder   Right/Left Shoulder Right;Left   Right Shoulder Flexion 5/5   Right Shoulder Extension 5/5   Right Shoulder ABduction 5/5   Right Shoulder Internal Rotation 5/5   Right Shoulder External Rotation 5/5   Right Shoulder Horizontal ABduction 5/5   Right Shoulder Horizontal ADduction 5/5   Left Shoulder Flexion 5/5   Left Shoulder Extension 5/5   Left Shoulder ABduction 5/5   Left Shoulder Internal Rotation 5/5    Left Shoulder External Rotation 5/5   Left Shoulder Horizontal ABduction 5/5   Left Shoulder Horizontal ADduction 5/5     Rounded shoulders /scapula forward                      PT Education - 02/03/16 1722    Education provided Yes   Education Details POC , self care at home , postural correction sitting and with use of pillow sleeping   Person(s) Educated Patient   Methods Explanation;Demonstration;Verbal cues   Comprehension Returned demonstration;Verbalized understanding          PT Short Term Goals - 02/03/16 1727    PT SHORT TERM GOAL #1   Title She will be independent with inital HEP   Time 2   Period Weeks   Status New   PT SHORT TERM GOAL #2   Title She will report use of support decreases pain with lying on RT side.    Time 3   Period Weeks   Status New   PT SHORT TERM GOAL #3   Title 25%decrease pain with work tasks   Time 3   Period Weeks   Status New           PT Long Term Goals - 02/03/16 1728    PT LONG TERM GOAL #1   Title She will be independent with all HEP issued   Time 6   Period Weeks   Status New   PT LONG TERM GOAL #2   Title She will report not waking due to pain    Time 6   Period Weeks   Status New   PT LONG TERM GOAL #3   Title She will report working with 1-2 max LT shoulder pain   Time 6   Period Weeks   Status New   PT LONG TERM GOAL #4   Title Minimal to no tenderness with palpation to tender area Lt shoulder   Time 6   Period Weeks   Status New   PT LONG TERM GOAL #5   Title Able to demo awareness of scapula position/ good posture   Time 6   Period Weeks   Status New               Plan - 02/03/16 1723    Clinical Impression Statement Ms Tracie James presents for low complexity evaluation with intermittant LT shoulder pain , forward shoulders, tenderness to pressure proximal to acromium  causing pain with sleeping and reaching   Rehab Potential Good   PT Frequency 2x / week  PT Duration 12  weeks   PT Treatment/Interventions Cryotherapy;Iontophoresis 4mg /ml Dexamethasone;Moist Heat;Ultrasound;Therapeutic exercise;Patient/family education;Manual techniques;Dry needling;Taping   PT Next Visit Plan modalities to tender area, manual, ionto if order signed , posterior shoulser strengthening   PT Home Exercise Plan scaula retraction   Consulted and Agree with Plan of Care Patient      Patient will benefit from skilled therapeutic intervention in order to improve the following deficits and impairments:  Pain, Postural dysfunction, Decreased activity tolerance  Visit Diagnosis: Pain in left shoulder - Plan: PT plan of care cert/re-cert  Abnormal posture - Plan: PT plan of care cert/re-cert     Problem List Patient Active Problem List   Diagnosis Date Noted  . Tendonitis of left rotator cuff 01/21/2016  . Obesity 05/27/2015  . Bipolar 2 disorder (HCC) 05/26/2015  . Acute anxiety 05/26/2015  . Palpitations 05/26/2015  . Asthma 05/26/2015  . Insomnia 05/26/2015  . Vitamin D deficiency 05/26/2015    Caprice RedChasse, Eren Puebla M  PT 02/03/2016, 5:35 PM  [facility] [address] [place], [place], [address] Phone: [phone]   Fax:  [phone]  Name: Tracie James MRN: [id number] Date of Birth: 07/04/1971

## 2016-02-03 NOTE — Patient Instructions (Signed)
Suggested use of ice to tender spot, scapula retraction and depression, use of pillow support to Lt arm when sleeping on RT side and she is able to shower with tape on shoulder but to remove if irritation occurs at all. Wear 3 days and remove if tape helpful

## 2016-02-09 ENCOUNTER — Ambulatory Visit: Payer: Managed Care, Other (non HMO) | Admitting: Physical Therapy

## 2016-02-09 DIAGNOSIS — M25512 Pain in left shoulder: Secondary | ICD-10-CM

## 2016-02-09 DIAGNOSIS — R293 Abnormal posture: Secondary | ICD-10-CM

## 2016-02-09 NOTE — Therapy (Signed)
[facility] [address] [place], [place], [address] Phone: [phone]   Fax:  [phone]  Physical Therapy Treatment  Patient Details  Name: Tracie James MRN: [id number] Date of Birth: 1971-02-27 Referring Provider: Gabriel Cirri, NP  Encounter Date: 02/09/2016      PT End of Session - 02/09/16 1649    Visit Number 2   Number of Visits 12   Date for PT Re-Evaluation 03/19/16   PT Start Time 1555  pt arrived 13 min late today   PT Stop Time 1640   PT Time Calculation (min) 45 min      Past Medical History:  Diagnosis Date  . Anxiety   . Asthma   . Bipolar 2 disorder (HCC)   . Depression   . GERD (gastroesophageal reflux disease)   . Palpitations   . PVC (premature ventricular contraction)     Past Surgical History:  Procedure Laterality Date  . ABDOMINAL HYSTERECTOMY    . APPENDECTOMY      There were no vitals filed for this visit.      Subjective Assessment - 02/09/16 1557    Subjective "Since evaluation she report sleeping alittle better, thinks the taping help"   Currently in Pain? Yes   Pain Score --  with touching 10/10 but resting 0/10   Pain Orientation Left                         [facility] Adult PT Treatment/Exercise - 02/09/16 0001      Modalities   Modalities Electrical Stimulation;Moist Heat     Moist Heat Therapy   Number Minutes Moist Heat 10 Minutes   Moist Heat Location Shoulder  L upper trap     Electrical Stimulation   Electrical Stimulation Location L uppe trap   Electrical Stimulation Action IFC   Electrical Stimulation Parameters 100% scan, L 5 x 10 min   Electrical Stimulation Goals Pain     Manual Therapy   Manual Therapy Taping;Myofascial release;Soft tissue mobilization;Joint mobilization   Joint Mobilization grade 2 T1-T6 P>A mobs   Soft tissue mobilization IASTM over the L upper trap/ levato scapulae   Myofascial Release fascial stretching/ rolling  of the L upper trap   McConnell L upper trap inhibition taping  Trial     Neck Exercises: Stretches   Upper Trapezius Stretch 2 reps;30 seconds          Trigger Point Dry Needling - 02/09/16 1637    Consent Given? Yes   Education Handout Provided Yes   Muscles Treated Upper Body Upper trapezius   Upper Trapezius Response Twitch reponse elicited;Palpable increased muscle length  x 2 with pistoning              PT Education - 02/09/16 1647    Education provided Yes   Education Details Dry needling education benefits to what to expect and after care. benefits of inhibition taping   Person(s) Educated Patient   Methods Explanation;Verbal cues   Comprehension Verbalized understanding;Verbal cues required          PT Short Term Goals - 02/03/16 1727      PT SHORT TERM GOAL #1   Title She will be independent with inital HEP   Time 2   Period Weeks   Status New     PT SHORT TERM GOAL #2   Title She will report use of support decreases pain with lying on RT side.  Time 3   Period Weeks   Status New     PT SHORT TERM GOAL #3   Title 25%decrease pain with work tasks   Time 3   Period Weeks   Status New           PT Long Term Goals - 02/03/16 1728      PT LONG TERM GOAL #1   Title She will be independent with all HEP issued   Time 6   Period Weeks   Status New     PT LONG TERM GOAL #2   Title She will report not waking due to pain    Time 6   Period Weeks   Status New     PT LONG TERM GOAL #3   Title She will report working with 1-2 max LT shoulder pain   Time 6   Period Weeks   Status New     PT LONG TERM GOAL #4   Title Minimal to no tenderness with palpation to tender area Lt shoulder   Time 6   Period Weeks   Status New     PT LONG TERM GOAL #5   Title Able to demo awareness of scapula position/ good posture   Time 6   Period Weeks   Status New               Plan - 02/09/16 1732    Clinical Impression Statement Ms.  Maciborski reports continued soreness in the L upper trap which is a 10/10 with gentle palpation. performed DN on the L upper trap x 2; pt as monitored throughout treatment. she reported some relief with myofasical release. attempted trial of inhibition taping. utilized MHP with e-stim post session to calm down DN soreness.    PT Next Visit Plan assess response to DN ,modalities to tender area, manual, ionto if order signed , posterior shoulser strengthening   Consulted and Agree with Plan of Care Patient      Patient will benefit from skilled therapeutic intervention in order to improve the following deficits and impairments:  Pain, Postural dysfunction, Decreased activity tolerance  Visit Diagnosis: Pain in left shoulder  Abnormal posture     Problem List Patient Active Problem List   Diagnosis Date Noted  . Tendonitis of left rotator cuff 01/21/2016  . Obesity 05/27/2015  . Bipolar 2 disorder (HCC) 05/26/2015  . Acute anxiety 05/26/2015  . Palpitations 05/26/2015  . Asthma 05/26/2015  . Insomnia 05/26/2015  . Vitamin D deficiency 05/26/2015   Lulu Riding PT, DPT, LAT, ATC  02/09/16  5:36 PM      [facility] [address] [place], [place], [address] Phone: [phone]   Fax:  [phone]  Name: Mirena Belan MRN: [id number] Date of Birth: March 19, 1971

## 2016-02-11 ENCOUNTER — Ambulatory Visit: Payer: Managed Care, Other (non HMO) | Admitting: Physical Therapy

## 2016-02-11 ENCOUNTER — Other Ambulatory Visit: Payer: Self-pay | Admitting: Unknown Physician Specialty

## 2016-02-11 DIAGNOSIS — R293 Abnormal posture: Secondary | ICD-10-CM

## 2016-02-11 DIAGNOSIS — M25512 Pain in left shoulder: Secondary | ICD-10-CM | POA: Diagnosis not present

## 2016-02-11 NOTE — Therapy (Signed)
[facility] [address] [place], [place], [address] Phone: [phone]   Fax:  [phone]  Physical Therapy Treatment  Patient Details  Name: Tracie James MRN: [id number] Date of Birth: 1970-10-10 Referring Provider: Gabriel Cirri, NP  Encounter Date: 02/11/2016      PT End of Session - 02/11/16 1423    Visit Number 3   Number of Visits 12   Date for PT Re-Evaluation 03/19/16   PT Start Time 1336  pt arrived 6 minutes late   PT Stop Time 1428   PT Time Calculation (min) 52 min   Activity Tolerance Patient tolerated treatment well   Behavior During Therapy [facility] for tasks assessed/performed      Past Medical History:  Diagnosis Date  . Anxiety   . Asthma   . Bipolar 2 disorder (HCC)   . Depression   . GERD (gastroesophageal reflux disease)   . Palpitations   . PVC (premature ventricular contraction)     Past Surgical History:  Procedure Laterality Date  . ABDOMINAL HYSTERECTOMY    . APPENDECTOMY      There were no vitals filed for this visit.      Subjective Assessment - 02/11/16 1342    Subjective "The DN made my very sore on Mon, but I am feeling much today with decreased soreness referred to L shoulder"    Currently in Pain? No/denies   Aggravating Factors  N/A   Pain Relieving Factors N/A                         [facility] Adult PT Treatment/Exercise - 02/11/16 1410      Shoulder Exercises: ROM/Strengthening   UBE (Upper Arm Bike) L 1.5 x 5 min  forward 2.5/ backward 2.5     Moist Heat Therapy   Number Minutes Moist Heat 10 Minutes   Moist Heat Location Shoulder     Electrical Stimulation   Electrical Stimulation Location L upper trap   Electrical Stimulation Action IFC   Electrical Stimulation Parameters 100% scan, L  x 10 min     Manual Therapy   Joint Mobilization grade 2 T1-T6 P>A mobs   Soft tissue mobilization IASTM over the L upper trap/ levato scapulae   Myofascial  Release fascial stretching/ rolling of the L upper trap   McConnell L upper trap inhibition taping          Trigger Point Dry Needling - 02/11/16 1409    Consent Given? Yes   Education Handout Provided Yes   Muscles Treated Upper Body Upper trapezius   Upper Trapezius Response Twitch reponse elicited;Palpable increased muscle length  x3 in the L upper trap with pistoning/ twisting                PT Short Term Goals - 02/11/16 1413      PT SHORT TERM GOAL #1   Title She will be independent with inital HEP   Period Weeks   Status On-going     PT SHORT TERM GOAL #2   Title She will report use of support decreases pain with lying on RT side.    Time 3   Period Weeks   Status On-going     PT SHORT TERM GOAL #3   Title 25%decrease pain with work tasks   Time 3   Period Weeks   Status On-going           PT Long Term Goals -  02/03/16 1728      PT LONG TERM GOAL #1   Title She will be independent with all HEP issued   Time 6   Period Weeks   Status New     PT LONG TERM GOAL #2   Title She will report not waking due to pain    Time 6   Period Weeks   Status New     PT LONG TERM GOAL #3   Title She will report working with 1-2 max LT shoulder pain   Time 6   Period Weeks   Status New     PT LONG TERM GOAL #4   Title Minimal to no tenderness with palpation to tender area Lt shoulder   Time 6   Period Weeks   Status New     PT LONG TERM GOAL #5   Title Able to demo awareness of scapula position/ good posture   Time 6   Period Weeks   Status New               Plan - 02/11/16 1411    Clinical Impression Statement Ms. Maciborski reported decreased pain in the L upper trap following last session with the DN. DN was performed x 3 on L upper trap; pt was monitored throughout treatment. following soft tissue work, reapplied inhibitoin taping and she was able to perform exercises. continued E-stim with MHP post session to calm down DN soreness.     PT Next Visit Plan assess response to DN ,modalities to tender area, manual, ionto if order signed , posterior shoulser strengthening   Consulted and Agree with Plan of Care Patient      Patient will benefit from skilled therapeutic intervention in order to improve the following deficits and impairments:     Visit Diagnosis: Pain in left shoulder  Abnormal posture     Problem List Patient Active Problem List   Diagnosis Date Noted  . Tendonitis of left rotator cuff 01/21/2016  . Obesity 05/27/2015  . Bipolar 2 disorder (HCC) 05/26/2015  . Acute anxiety 05/26/2015  . Palpitations 05/26/2015  . Asthma 05/26/2015  . Insomnia 05/26/2015  . Vitamin D deficiency 05/26/2015   Lulu Riding PT, DPT, LAT, ATC  02/11/16  2:29 PM      [facility] [address] [place], [place], [address] Phone: [phone]   Fax:  [phone]  Name: Rukmini Robuck MRN: [id number] Date of Birth: 16-Jul-1971

## 2016-02-12 NOTE — Telephone Encounter (Signed)
rx

## 2016-02-16 ENCOUNTER — Other Ambulatory Visit: Payer: Self-pay | Admitting: Unknown Physician Specialty

## 2016-02-16 ENCOUNTER — Ambulatory Visit: Payer: Managed Care, Other (non HMO)

## 2016-02-16 DIAGNOSIS — M25512 Pain in left shoulder: Secondary | ICD-10-CM

## 2016-02-16 DIAGNOSIS — R293 Abnormal posture: Secondary | ICD-10-CM

## 2016-02-16 NOTE — Therapy (Signed)
[facility] Outpatient Rehabilitation [facility] [address] [place], [place], [address] Phone: [phone]   Fax:  [phone]  Physical Therapy Treatment  Patient Details  Name: Tracie James MRN: [id number] Date of Birth: 22-Jun-1971 Referring Provider: Gabriel Cirri, NP  Encounter Date: 02/16/2016      PT End of Session - 02/16/16 1627    Visit Number 4   Number of Visits 12   Date for PT Re-Evaluation 03/19/16   PT Start Time 0345   PT Stop Time 0443   PT Time Calculation (min) 58 min   Activity Tolerance Patient tolerated treatment well;No increased pain   Behavior During Therapy WFL for tasks assessed/performed      Past Medical History:  Diagnosis Date  . Anxiety   . Asthma   . Bipolar 2 disorder (HCC)   . Depression   . GERD (gastroesophageal reflux disease)   . Palpitations   . PVC (premature ventricular contraction)     Past Surgical History:  Procedure Laterality Date  . ABDOMINAL HYSTERECTOMY    . APPENDECTOMY      There were no vitals filed for this visit.      Subjective Assessment - 02/16/16 1550    Subjective Improving as I can move head and stretch muscle now. PAin today started yesterday   Currently in Pain? Yes   Pain Score 3    Pain Location Shoulder   Pain Orientation Left   Pain Descriptors / Indicators Pressure   Pain Type Chronic pain   Pain Onset More than a month ago   Multiple Pain Sites No                         [facility] Adult PT Treatment/Exercise - 02/16/16 1553      Shoulder Exercises: ROM/Strengthening   UBE (Upper Arm Bike) L1.5 3 min for 3 back     Moist Heat Therapy   Number Minutes Moist Heat 10 Minutes   Moist Heat Location Shoulder     Electrical Stimulation   Electrical Stimulation Location L upper trap   Electrical Stimulation Action IFC   Electrical Stimulation Goals Pain     Manual Therapy   Joint Mobilization grade 2 T1-T6 P>A mobs   Soft tissue mobilization IASTM  over the L upper trap/ levato scapulae   Myofascial Release fascial stretching/ rolling of the L upper trap   McConnell L upper trap inhibition taping usung kineseotape                  PT Short Term Goals - 02/11/16 1413      PT SHORT TERM GOAL #1   Title She will be independent with inital HEP   Period Weeks   Status On-going     PT SHORT TERM GOAL #2   Title She will report use of support decreases pain with lying on RT side.    Time 3   Period Weeks   Status On-going     PT SHORT TERM GOAL #3   Title 25%decrease pain with work tasks   Time 3   Period Weeks   Status On-going           PT Long Term Goals - 02/03/16 1728      PT LONG TERM GOAL #1   Title She will be independent with all HEP issued   Time 6   Period Weeks   Status New     PT LONG TERM GOAL #  2   Title She will report not waking due to pain    Time 6   Period Weeks   Status New     PT LONG TERM GOAL #3   Title She will report working with 1-2 max LT shoulder pain   Time 6   Period Weeks   Status New     PT LONG TERM GOAL #4   Title Minimal to no tenderness with palpation to tender area Lt shoulder   Time 6   Period Weeks   Status New     PT LONG TERM GOAL #5   Title Able to demo awareness of scapula position/ good posture   Time 6   Period Weeks   Status New               Plan - 02/16/16 1627    Clinical Impression Statement She reports flareup of pain after benefit from needling.Tender with STW but calme after doing this at about 5 min.   She is improving .    PT Treatment/Interventions Cryotherapy;Iontophoresis 4mg /ml Dexamethasone;Moist Heat;Ultrasound;Therapeutic exercise;Patient/family education;Manual techniques;Dry needling;Taping   PT Next Visit Plan Continue DN , manual , modalities , start HEP with strength with bands   Consulted and Agree with Plan of Care Patient      Patient will benefit from skilled therapeutic intervention in order to improve the  following deficits and impairments:  Pain, Postural dysfunction, Decreased activity tolerance  Visit Diagnosis: Pain in left shoulder  Abnormal posture     Problem List Patient Active Problem List   Diagnosis Date Noted  . Tendonitis of left rotator cuff 01/21/2016  . Obesity 05/27/2015  . Bipolar 2 disorder (HCC) 05/26/2015  . Acute anxiety 05/26/2015  . Palpitations 05/26/2015  . Asthma 05/26/2015  . Insomnia 05/26/2015  . Vitamin D deficiency 05/26/2015    Caprice Red  PT 02/16/2016, 4:31 PM  [facility] [address] [place], [place], [address] Phone: [phone]   Fax:  [phone]  Name: Tracie James MRN: [id number] Date of Birth: 1970-11-17

## 2016-02-18 ENCOUNTER — Ambulatory Visit: Payer: Managed Care, Other (non HMO) | Attending: Unknown Physician Specialty | Admitting: Physical Therapy

## 2016-02-18 DIAGNOSIS — R293 Abnormal posture: Secondary | ICD-10-CM

## 2016-02-18 DIAGNOSIS — M25512 Pain in left shoulder: Secondary | ICD-10-CM | POA: Diagnosis present

## 2016-02-18 NOTE — Therapy (Signed)
[facility] Outpatient Rehabilitation [facility] [address] [place], [place], [address] Phone: [phone]   Fax:  [phone]  Physical Therapy Treatment  Patient Details  Name: Tracie James MRN: [id number] Date of Birth: 12/28/1970 Referring Provider: Gabriel Cirri, NP  Encounter Date: 02/18/2016      PT End of Session - 02/18/16 1619    Visit Number 5   Number of Visits 12   Date for PT Re-Evaluation 03/19/16   PT Start Time 1535   PT Stop Time 1618   PT Time Calculation (min) 43 min   Activity Tolerance Patient tolerated treatment well   Behavior During Therapy [facility] for tasks assessed/performed      Past Medical History:  Diagnosis Date  . Anxiety   . Asthma   . Bipolar 2 disorder (HCC)   . Depression   . GERD (gastroesophageal reflux disease)   . Palpitations   . PVC (premature ventricular contraction)     Past Surgical History:  Procedure Laterality Date  . ABDOMINAL HYSTERECTOMY    . APPENDECTOMY      There were no vitals filed for this visit.      Subjective Assessment - 02/18/16 1540    Subjective " The shoulder is feeling better"   Currently in Pain? Yes   Pain Score 0-No pain   Pain Location Shoulder   Pain Orientation Left                         [facility] Adult PT Treatment/Exercise - 02/18/16 1542      Shoulder Exercises: Seated   Other Seated Exercises thoracic extension over back of chair 1x 10 holding at end range for 3 sec  pushing from arm rests bil     Shoulder Exercises: Prone   Other Prone Exercises I's Y's and T's 1 x 12 each direction 1#     Shoulder Exercises: Standing   Extension AROM;Strengthening;15 reps;Theraband   Theraband Level (Shoulder Extension) Level 4 (Blue)   Row AROM;Strengthening;15 reps;Theraband  2 sets   Theraband Level (Shoulder Row) Level 4 (Blue)   Other Standing Exercises wall washing 3 x 30 sec CW/CCW LUE only     Shoulder Exercises: ROM/Strengthening   UBE (Upper  Arm Bike) L 2  x 8 min  forward 4 min/ 4 min      Moist Heat Therapy   Number Minutes Moist Heat 10 Minutes   Moist Heat Location Shoulder                PT Education - 02/18/16 1614    Education provided Yes   Education Details avoiding proping arm up on arm rest of couch and hiking shoulder especially while sleeping and watching tv to avoid tightness in the upper trapezius   Person(s) Educated Patient   Methods Explanation;Verbal cues   Comprehension Verbalized understanding;Verbal cues required          PT Short Term Goals - 02/11/16 1413      PT SHORT TERM GOAL #1   Title She will be independent with inital HEP   Period Weeks   Status On-going     PT SHORT TERM GOAL #2   Title She will report use of support decreases pain with lying on RT side.    Time 3   Period Weeks   Status On-going     PT SHORT TERM GOAL #3   Title 25%decrease pain with work tasks   Time 3  Period Weeks   Status On-going           PT Long Term Goals - 02/03/16 1728      PT LONG TERM GOAL #1   Title She will be independent with all HEP issued   Time 6   Period Weeks   Status New     PT LONG TERM GOAL #2   Title She will report not waking due to pain    Time 6   Period Weeks   Status New     PT LONG TERM GOAL #3   Title She will report working with 1-2 max LT shoulder pain   Time 6   Period Weeks   Status New     PT LONG TERM GOAL #4   Title Minimal to no tenderness with palpation to tender area Lt shoulder   Time 6   Period Weeks   Status New     PT LONG TERM GOAL #5   Title Able to demo awareness of scapula position/ good posture   Time 6   Period Weeks   Status New               Plan - 02/18/16 1619    Clinical Impression Statement Mrs. Maciborksi states she is doing well today with no report of pain. Opted due to pt not having any pain to focus on strengthening of the scapular stabilizers and thoracic mobility, which she performed well with  minimal report of soreness. post session she declined modalities and reported no pain.    PT Next Visit Plan Continue DN PRN , manual , modalities , start HEP with strength with bands   Consulted and Agree with Plan of Care Patient      Patient will benefit from skilled therapeutic intervention in order to improve the following deficits and impairments:  Pain, Postural dysfunction, Decreased activity tolerance  Visit Diagnosis: Pain in left shoulder  Abnormal posture     Problem List Patient Active Problem List   Diagnosis Date Noted  . Tendonitis of left rotator cuff 01/21/2016  . Obesity 05/27/2015  . Bipolar 2 disorder (HCC) 05/26/2015  . Acute anxiety 05/26/2015  . Palpitations 05/26/2015  . Asthma 05/26/2015  . Insomnia 05/26/2015  . Vitamin D deficiency 05/26/2015   Lulu Riding PT, DPT, LAT, ATC  02/18/16  4:22 PM      [facility] [address] [place], [place], [address] Phone: [phone]   Fax:  [phone]  Name: Brooklyn Alfredo MRN: [id number] Date of Birth: 12/02/1970

## 2016-02-23 ENCOUNTER — Ambulatory Visit: Payer: Managed Care, Other (non HMO) | Admitting: Physical Therapy

## 2016-02-23 DIAGNOSIS — R293 Abnormal posture: Secondary | ICD-10-CM

## 2016-02-23 DIAGNOSIS — M25512 Pain in left shoulder: Secondary | ICD-10-CM | POA: Diagnosis not present

## 2016-02-23 NOTE — Therapy (Signed)
[facility] Outpatient Rehabilitation [facility] [address] [place], [place], [address] Phone: [phone]   Fax:  [phone]  Physical Therapy Treatment  Patient Details  Name: Tracie James MRN: [id number] Date of Birth: [birth year] Referring Provider: Gabriel Cirri, NP  Encounter Date: 02/23/2016    Past Medical History:  Diagnosis Date  . Anxiety   . Asthma   . Bipolar 2 disorder (HCC)   . Depression   . GERD (gastroesophageal reflux disease)   . Palpitations   . PVC (premature ventricular contraction)     Past Surgical History:  Procedure Laterality Date  . ABDOMINAL HYSTERECTOMY    . APPENDECTOMY      There were no vitals filed for this visit.      Subjective Assessment - 02/23/16 1551    Subjective "I had a signifcant spasm in the L mid spine over the weekend"   Currently in Pain? Yes   Pain Score 5    Pain Location Shoulder   Pain Orientation Left   Pain Descriptors / Indicators Stabbing   Pain Type Chronic pain   Pain Onset More than a month ago   Pain Frequency Intermittent   Aggravating Factors  N/A   Pain Relieving Factors N/A                         [facility] Adult PT Treatment/Exercise - 02/23/16 0001      Shoulder Exercises: ROM/Strengthening   UBE (Upper Arm Bike) L1 x 5 min  forward only     Modalities   Modalities Ultrasound     Ultrasound   Ultrasound Location L upper trap   Ultrasound Parameters 100%, 1.3 w/cm2 x 8 min   Ultrasound Goals Pain     Manual Therapy   Joint Mobilization grade 2 T1-T6 P>A mobs   Soft tissue mobilization IASTM over the L upper trap/ levato scapulae   Myofascial Release fascial stretching/ rolling of the L upper trap          Trigger Point Dry Needling - 02/23/16 1630    Consent Given? Yes   Education Handout Provided Yes   Muscles Treated Upper Body Upper trapezius   Upper Trapezius Response Twitch reponse elicited;Palpable increased muscle length  x 2 with  pistoning technique                PT Short Term Goals - 02/11/16 1413      PT SHORT TERM GOAL #1   Title She will be independent with inital HEP   Period Weeks   Status On-going     PT SHORT TERM GOAL #2   Title She will report use of support decreases pain with lying on RT side.    Time 3   Period Weeks   Status On-going     PT SHORT TERM GOAL #3   Title 25%decrease pain with work tasks   Time 3   Period Weeks   Status On-going           PT Long Term Goals - 02/03/16 1728      PT LONG TERM GOAL #1   Title She will be independent with all HEP issued   Time 6   Period Weeks   Status New     PT LONG TERM GOAL #2   Title She will report not waking due to pain    Time 6   Period Weeks   Status New     PT LONG  TERM GOAL #3   Title She will report working with 1-2 max LT shoulder pain   Time 6   Period Weeks   Status New     PT LONG TERM GOAL #4   Title Minimal to no tenderness with palpation to tender area Lt shoulder   Time 6   Period Weeks   Status New     PT LONG TERM GOAL #5   Title Able to demo awareness of scapula position/ good posture   Time 6   Period Weeks   Status New               Plan - 02/23/16 1741    Clinical Impression Statement Tracie James reports she was doing well until today having stabbing pain in the L shoulder. DN was performed on the L upper trap; pt was monitored throughout treatment. follwoing soft tissue work and stretching utilized US for deep heat to calm down soreness which pt reported the stabbing had stopped but still was sore from the DN.    PT Next Visit Plan assess response from DN. Continue DN PRN , manual , modalities , start HEP with strength with bands   Consulted and Agree with Plan of Care Patient      Patient will benefit from skilled therapeutic intervention in order to improve the following deficits and impairments:  Pain, Postural dysfunction, Decreased activity tolerance  Visit  Diagnosis: Pain in left shoulder  Abnormal posture     Problem List Patient Active Problem List   Diagnosis Date Noted  . Tendonitis of left rotator cuff 01/21/2016  . Obesity 05/27/2015  . Bipolar 2 disorder (HCC) 05/26/2015  . Acute anxiety 05/26/2015  . Palpitations 05/26/2015  . Asthma 05/26/2015  . Insomnia 05/26/2015  . Vitamin D deficiency 05/26/2015   Lulu RidingKristoffer Emberleigh Reily PT, DPT, LAT, ATC  02/23/16  5:43 PM      [facility] [address] [place], [place], [address] Phone: [phone]   Fax:  [phone]  Name: Tracie James MRN: [id number] Date of Birth: 09/14/1970

## 2016-02-25 ENCOUNTER — Ambulatory Visit: Payer: Managed Care, Other (non HMO) | Admitting: Physical Therapy

## 2016-02-25 DIAGNOSIS — M25512 Pain in left shoulder: Secondary | ICD-10-CM

## 2016-02-25 DIAGNOSIS — R293 Abnormal posture: Secondary | ICD-10-CM

## 2016-02-25 NOTE — Patient Instructions (Signed)
External Rotation (Isometric)    With elbow bent and held at side, use other hand to apply resistance to outward motion of arm. Hold _5___ seconds. Repeat __5-10Internal Rotation (Isometric)    .Marland Kitchen.Marland Kitchen.Can push hand into door way pushing hand out.    With elbow bent and held at side, use other hand to apply resistance to inward motion of arm. Hold _5___ seconds. Repeat 5-10____ times. Do 1____ sessions per day.    ....Can push hand toward tummy with hand at doorway.  Extension (Isometric)    Place left bent elbow and back of arm against wall. Press elbow against wall. Hold _5__ seconds. Repeat __5-10__ times. Do __1__ sessions per day.  http://gt2.exer.us/112   Copyright  VHI. All rights reserved.    Copyright  VHI. All rights reserved.

## 2016-02-25 NOTE — Therapy (Signed)
[facility] [address] [place], [place], [address] Phone: [phone]   Fax:  [phone]  Physical Therapy Treatment  Patient Details  Name: Tracie James MRN: [id number] Date of Birth: 01/05/1971 Referring Provider: Gabriel Cirriheryl Wicker, NP  Encounter Date: 02/25/2016      PT End of Session - 02/25/16 1639    Visit Number 6   Number of Visits 12   Date for PT Re-Evaluation 03/19/16   PT Start Time 1545   PT Stop Time 1643   PT Time Calculation (min) 58 min   Activity Tolerance Patient tolerated treatment well   Behavior During Therapy [facility] for tasks assessed/performed      Past Medical History:  Diagnosis Date  . Anxiety   . Asthma   . Bipolar 2 disorder (HCC)   . Depression   . GERD (gastroesophageal reflux disease)   . Palpitations   . PVC (premature ventricular contraction)     Past Surgical History:  Procedure Laterality Date  . ABDOMINAL HYSTERECTOMY    . APPENDECTOMY      There were no vitals filed for this visit.      Subjective Assessment - 02/25/16 1552    Subjective No pain    Currently in Pain? No/denies   Pain Location Shoulder   Pain Orientation Left                         [facility] Adult PT Treatment/Exercise - 02/25/16 0001      Knee/Hip Exercises: Aerobic   Nustep minutes level 3     Shoulder Exercises: Supine   Other Supine Exercises shoulder retraction 5 X,  shoulder extension isometric 5 X 5   Other Supine Exercises head press 5 X 5 seconds     Shoulder Exercises: Seated   Other Seated Exercises triceps 10 X green band,  Biceps 1X stopped due to anterior shoulder pain.       Shoulder Exercises: Isometric Strengthening   Extension 5X5"   External Rotation 5X5"   Internal Rotation 5X5"     Shoulder Exercises: Stretch   Cross Chest Stretch 1 rep;30 seconds   Cross Chest Stretch Limitations Both   External Rotation Stretch 1 rep;30 seconds  each   Other Shoulder  Stretches behind body stretch 30 X1 each   Other Shoulder Stretches sleeper stretch  3 seconds,  then stopped due to upprt trap spasm.                  PT Education - 02/25/16 1638    Education provided Yes   Education Details isometrics   Person(s) Educated Patient   Methods Explanation;Demonstration;Verbal cues;Handout   Comprehension Verbalized understanding;Returned demonstration          PT Short Term Goals - 02/25/16 1647      PT SHORT TERM GOAL #1   Title She will be independent with inital HEP   Baseline started today   Time 2   Period Weeks   Status On-going     PT SHORT TERM GOAL #2   Title She will report use of support decreases pain with lying on RT side.    Baseline sometimes able to   Time 3   Period Weeks     PT SHORT TERM GOAL #3   Title 25%decrease pain with work tasks   Baseline Had a normal day activity wise with no pain   Time 3   Period Weeks   Status  On-going           PT Long Term Goals - 02/03/16 1728      PT LONG TERM GOAL #1   Title She will be independent with all HEP issued   Time 6   Period Weeks   Status New     PT LONG TERM GOAL #2   Title She will report not waking due to pain    Time 6   Period Weeks   Status New     PT LONG TERM GOAL #3   Title She will report working with 1-2 max LT shoulder pain   Time 6   Period Weeks   Status New     PT LONG TERM GOAL #4   Title Minimal to no tenderness with palpation to tender area Lt shoulder   Time 6   Period Weeks   Status New     PT LONG TERM GOAL #5   Title Able to demo awareness of scapula position/ good posture   Time 6   Period Weeks   Status New               Plan - 02/25/16 1639    Clinical Impression Statement No pain initially and at end of session.  She did have upper trap spasm with attempting to do a sleeper stretch.  Pain improved with trigger point release.  She did have anterior shoulder pain with green band triceps so stopped after 1  rep.  Extra care was give to make sure pain would not increase with exercise.  Able to progress home exercises for strengthening with isometrics. Left shoulder flexion WNL,  ER 50 degrees AROM sitting.   PT Next Visit Plan assess response to treatment and new exercises, review isometrics   PT Home Exercise Plan isometrics, IR/ER/Extension   Consulted and Agree with Plan of Care Patient      Patient will benefit from skilled therapeutic intervention in order to improve the following deficits and impairments:     Visit Diagnosis: Pain in left shoulder  Abnormal posture     Problem List Patient Active Problem List   Diagnosis Date Noted  . Tendonitis of left rotator cuff 01/21/2016  . Obesity 05/27/2015  . Bipolar 2 disorder (HCC) 05/26/2015  . Acute anxiety 05/26/2015  . Palpitations 05/26/2015  . Asthma 05/26/2015  . Insomnia 05/26/2015  . Vitamin D deficiency 05/26/2015    [facility] 02/25/2016, 4:52 PM  [facility] [address] [place], [place], [address] Phone: [phone]   Fax:  [phone]  Name: Tracie James MRN: [id number] Date of Birth: [birth year]   Liz Beach, PTA 02/25/16 4:53 PM Phone: [phone] Fax: [phone]

## 2016-03-01 ENCOUNTER — Ambulatory Visit: Payer: Managed Care, Other (non HMO) | Admitting: Unknown Physician Specialty

## 2016-03-01 ENCOUNTER — Encounter: Payer: Self-pay | Admitting: Family Medicine

## 2016-03-01 ENCOUNTER — Ambulatory Visit (INDEPENDENT_AMBULATORY_CARE_PROVIDER_SITE_OTHER): Payer: Managed Care, Other (non HMO) | Admitting: Family Medicine

## 2016-03-01 VITALS — BP 124/76 | HR 88 | Temp 97.9°F | Wt 238.0 lb

## 2016-03-01 DIAGNOSIS — M7582 Other shoulder lesions, left shoulder: Secondary | ICD-10-CM | POA: Diagnosis not present

## 2016-03-01 DIAGNOSIS — F3181 Bipolar II disorder: Secondary | ICD-10-CM

## 2016-03-01 MED ORDER — LURASIDONE HCL 40 MG PO TABS: 40.0000 mg | ORAL_TABLET | Freq: Every day | ORAL | 0 refills | Status: DC

## 2016-03-01 MED ORDER — METHOCARBAMOL 750 MG PO TABS: 750.0000 mg | ORAL_TABLET | Freq: Four times a day (QID) | ORAL | 1 refills | Status: DC | PRN

## 2016-03-01 MED ORDER — MELOXICAM 15 MG PO TABS: 15.0000 mg | ORAL_TABLET | Freq: Every day | ORAL | 0 refills | Status: DC

## 2016-03-01 NOTE — Assessment & Plan Note (Signed)
Continue current regimen, but increase Latuda to 40 mg daily. Recheck in 1 month

## 2016-03-01 NOTE — Progress Notes (Signed)
BP 124/76   Pulse 88   Temp 97.9 F (36.6 C)   Wt 238 lb (108 kg)   LMP  (LMP Unknown)   SpO2 100%   BMI 40.85 kg/m    Subjective:    Patient ID: Tracie ClaymanAimee James, female    DOB: 06/19/1971, 45 y.o.   MRN: [id number]  HPI: Tracie Claymanimee James is a 45 y.o. female  Chief Complaint  Patient presents with  . Shoulder Pain    left shoulder, she has been going to PT. It is better in some aspects, but pain is still terrible.  . Depression    follow up, feels like she is about the same as last time   Patient presents for 1 month f/u of left shoulder pain and depression. Left shoulder is slightly improved but still very painful despite 6 months of PT, dry needling, and multiple drug therapies including robaxin, tylenol, and ibuprofen. She is having trouble with ADLs due to the pain. Has 2 sessions of PT left and is wondering what to do from here since it is not getting better.   Started on the latuda last time. Does not feel like it has made much of a difference, but feels no worse than before. Is scheduled to start counseling through her work next month. No side effects on the latuda. Taking her medications faithfully.  Relevant past medical, surgical, family and social history reviewed and updated as indicated. Interim medical history since our last visit reviewed. Allergies and medications reviewed and updated.  Review of Systems  Constitutional: Negative.   HENT: Negative.   Respiratory: Negative.   Cardiovascular: Negative.   Gastrointestinal: Negative.   Genitourinary: Negative.   Musculoskeletal: Positive for arthralgias.  Neurological: Negative.   Psychiatric/Behavioral: Positive for dysphoric mood.    Depression screen [facility] 2[facility]9 03/01/2016 01/21/2016 05/27/2015  Decreased Interest 3 3 3   Down, Depressed, Hopeless 3 1 2   PHQ - 2 Score 6 4 5   Altered sleeping 3 3 2   Tired, decreased energy 3 3 3   Change in appetite 0 1 3  Feeling bad or failure about yourself  3 1 -  Trouble  concentrating 1 1 0  Moving slowly or fidgety/restless 0 1 0  Suicidal thoughts 3 0 1  PHQ-9 Score 19 14 14   Difficult doing work/chores Somewhat difficult - -    Per HPI unless specifically indicated above     Objective:    BP 124/76   Pulse 88   Temp 97.9 F (36.6 C)   Wt 238 lb (108 kg)   LMP  (LMP Unknown)   SpO2 100%   BMI 40.85 kg/m   Wt Readings from Last 3 Encounters:  03/01/16 238 lb (108 kg)  01/21/16 232 lb 6.4 oz (105.4 kg)  11/12/15 233 lb (105.7 kg)    Physical Exam  Constitutional: She is oriented to person, place, and time. She appears well-developed and well-nourished.  HENT:  Head: Atraumatic.  Eyes: Conjunctivae are normal. Pupils are equal, round, and reactive to light.  Neck: Normal range of motion. Neck supple.  Cardiovascular: Normal rate and normal heart sounds.   Pulmonary/Chest: Effort normal and breath sounds normal.  Musculoskeletal: She exhibits tenderness (TTP over left trapezius and deltoid).  Pain with ROM of left shoulder, worse with active  Neurological: She is alert and oriented to person, place, and time.  Skin: Skin is warm and dry.  Psychiatric: She has a normal mood and affect. Her behavior is normal.  Nursing note  and vitals reviewed.     Assessment & Plan:   Problem List Items Addressed This Visit      Musculoskeletal and Integument   Tendonitis of left rotator cuff - Primary    Referral to orthopedics placed. Meloxicam ordered to help her until they can do a workup. Finish out PT, but it is not resolving with just PT so will not pursue more sessions at this time.      Relevant Medications   meloxicam (MOBIC) 15 MG tablet   methocarbamol (ROBAXIN) 750 MG tablet   Other Relevant Orders   AMB referral to orthopedics     Other   Bipolar 2 disorder (HCC)    Continue current regimen, but increase Latuda to 40 mg daily. Recheck in 1 month       Other Visit Diagnoses   None.      Follow up plan: Return in about 4  weeks (around 03/29/2016) for Depression.

## 2016-03-01 NOTE — Assessment & Plan Note (Signed)
Referral to orthopedics placed. Meloxicam ordered to help her until they can do a workup. Finish out PT, but it is not resolving with just PT so will not pursue more sessions at this time.

## 2016-03-01 NOTE — Patient Instructions (Addendum)
Follow up as needed

## 2016-03-02 ENCOUNTER — Ambulatory Visit: Payer: Managed Care, Other (non HMO)

## 2016-03-02 DIAGNOSIS — R293 Abnormal posture: Secondary | ICD-10-CM

## 2016-03-02 DIAGNOSIS — M25512 Pain in left shoulder: Secondary | ICD-10-CM

## 2016-03-02 NOTE — Therapy (Addendum)
[place], [place], [address] Phone: [phone]   Fax:  [phone]  Patient Details  Name: Bleu Moisan MRN: [id number] Date of Birth: 1971-03-14 Referring Provider:  Kathrine Haddock, NP  Encounter Date: 03/02/2016     Ms Lubertha Sayres arrived today with no pain. We discussed her MD visit from 03/01/16 and how she reported she was not getting better. She stated today she is actually worse for a day or so after PT. She reported she will see Ortho MD in future.  I felt it was not appropriate to continue if she was making no progress  ,that in near future she will have Ortho input  and she has a $25 co pay without benefit.  She agreed to be on hold until after Ortho appointment and will discharge or renew depending on what Ortho feels is best approach.   Darrel Hoover  PT 03/02/2016, 4:04 PM                                                                     PHYSICAL THERAPY DISCHARGE SUMMARY  Visits from Start of Care: 8  Current functional level related to goals / functional outcomes: See above    Remaining deficits: See above   Education / Equipment: HEP Plan: Patient agrees to discharge.  Patient goals were partially met. Patient is being discharged due to not returning since the last visit.  ?????    Lillette Boxer [facility]  PT 05/04/16       3:51 PM  [facility] [address] [place], [place], [address] Phone: [phone]   Fax:  [phone]

## 2016-03-04 ENCOUNTER — Ambulatory Visit: Payer: Managed Care, Other (non HMO)

## 2016-03-29 ENCOUNTER — Other Ambulatory Visit: Payer: Self-pay

## 2016-03-29 MED ORDER — MELOXICAM 15 MG PO TABS: 15.0000 mg | ORAL_TABLET | Freq: Every day | ORAL | 0 refills | Status: DC

## 2016-03-30 ENCOUNTER — Other Ambulatory Visit: Payer: Self-pay

## 2016-03-30 MED ORDER — LURASIDONE HCL 40 MG PO TABS: 40.0000 mg | ORAL_TABLET | Freq: Every day | ORAL | 0 refills | Status: DC

## 2016-04-07 ENCOUNTER — Encounter: Payer: Self-pay | Admitting: Unknown Physician Specialty

## 2016-04-07 ENCOUNTER — Ambulatory Visit (INDEPENDENT_AMBULATORY_CARE_PROVIDER_SITE_OTHER): Payer: Managed Care, Other (non HMO) | Admitting: Unknown Physician Specialty

## 2016-04-07 DIAGNOSIS — F3181 Bipolar II disorder: Secondary | ICD-10-CM

## 2016-04-07 NOTE — Progress Notes (Signed)
BP 112/76 (BP Location: Left Arm, Patient Position: Sitting, Cuff Size: Large)   Pulse 75   Temp 98.2 F (36.8 C)   Ht 5' 4.1" (1.628 m)   Wt 239 lb 4.8 oz (108.5 kg)   LMP  (LMP Unknown)   SpO2 97%   BMI 40.95 kg/m    Subjective:    Patient ID: Tracie James, female    DOB: 06/06/1971, 45 y.o.   MRN: [id number]  HPI: Tracie James is a 45 y.o. female  Chief Complaint  Patient presents with  . Depression    1 month f/up   Depression Last visit, Latuda was increased.  She states she feels a little better.  She has not started counseling yet.   Pt has tried a number of therapies in the past.  This seems to have worked the best.  She has tried and failed Cymbalta, Zoloft, Lithium, Abilify, Seroquel..  She is currently taking Wellbutrin along with the Latuda and Prozac. Sleep has improved.  She states fatigue and overeating is her biggest struggle.  She is taking Vitamin D and getting it checked again next month.  They will also check a TSH.   [facility] 04/07/2016 03/01/2016 01/21/2016 05/27/2015  Decreased Interest 2 3 3 3   Down, Depressed, Hopeless 1 3 1 2   PHQ - 2 Score 3 6 4 5   Altered sleeping 2 3 3 2   Tired, decreased energy 3 3 3 3   Change in appetite 3 0 1 3  Feeling bad or failure about yourself  1 3 1  -  Trouble concentrating 1 1 1  0  Moving slowly or fidgety/restless 0 0 1 0  Suicidal thoughts 0 3 0 1  PHQ-9 Score 13 19 14 14   Difficult doing work/chores - Somewhat difficult - -   Shoulder pain Better with Cortisone at Orthopedics and rest.    Relevant past medical, surgical, family and social history reviewed and updated as indicated. Interim medical history since our last visit reviewed. Allergies and medications reviewed and updated.  Review of Systems  Per HPI unless specifically indicated above     Objective:    BP 112/76 (BP Location: Left Arm, Patient Position: Sitting, Cuff Size: Large)   Pulse 75   Temp 98.2 F (36.8 C)   Ht  5' 4.1" (1.628 m)   Wt 239 lb 4.8 oz (108.5 kg)   LMP  (LMP Unknown)   SpO2 97%   BMI 40.95 kg/m   Wt Readings from Last 3 Encounters:  04/07/16 239 lb 4.8 oz (108.5 kg)  03/01/16 238 lb (108 kg)  01/21/16 232 lb 6.4 oz (105.4 kg)    Physical Exam  Constitutional: She is oriented to person, place, and time. She appears well-developed and well-nourished. No distress.  HENT:  Head: Normocephalic and atraumatic.  Eyes: Conjunctivae and lids are normal. Right eye exhibits no discharge. Left eye exhibits no discharge. No scleral icterus.  Neck: Normal range of motion. Neck supple. No JVD present. Carotid bruit is not present.  Cardiovascular: Normal rate, regular rhythm and normal heart sounds.   Pulmonary/Chest: Effort normal and breath sounds normal.  Abdominal: Normal appearance. There is no splenomegaly or hepatomegaly.  Musculoskeletal: Normal range of motion.  Neurological: She is alert and oriented to person, place, and time.  Skin: Skin is warm, dry and intact. No rash noted. No pallor.  Psychiatric: She has a normal mood and affect. Her behavior is normal. Judgment and thought content normal.  Results for orders placed or performed in visit on 08/03/13  Urinalysis, Complete  Result Value Ref Range   Color - urine Yellow    Clarity - urine Hazy    Glucose,UR Negative 0 - 75 mg/dL   Bilirubin,UR Negative NEGATIVE   Ketone Negative NEGATIVE   Specific Gravity 1.009 1.003 - 1.030   Blood Negative NEGATIVE   Ph 6.0 4.5 - 8.0   Protein Negative NEGATIVE   Nitrite Negative NEGATIVE   Leukocyte Esterase 3+ NEGATIVE   RBC,UR 4 /HPF 0 - 5 /HPF   WBC UR 198 /HPF 0 - 5 /HPF   Bacteria TRACE NONE SEEN   Squamous Epithelial 1 /HPF    Mucous PRESENT       Assessment & Plan:   Problem List Items Addressed This Visit      Unprioritized   Bipolar 2 disorder (HCC)    Pt is doing better on 40 mg of Latuda.  She is feeling better on the 40 mg of Latuda.  Will see see next month  and try to maximize B12, Vit D and consider increasing.  Consider adding Cytomel       Other Visit Diagnoses   None.      Follow up plan: Return if symptoms worsen or fail to improve, for and for physical.

## 2016-04-07 NOTE — Assessment & Plan Note (Addendum)
Pt is doing better on 40 mg of Latuda.  She is feeling better on the 40 mg of Latuda.  Will see see next month and try to maximize B12, Vit D and consider increasing.  Consider adding Cytomel

## 2016-04-30 ENCOUNTER — Other Ambulatory Visit: Payer: Self-pay | Admitting: Family Medicine

## 2016-04-30 NOTE — Telephone Encounter (Signed)
Your patient 

## 2016-05-03 ENCOUNTER — Encounter: Payer: Self-pay | Admitting: Family Medicine

## 2016-05-03 ENCOUNTER — Ambulatory Visit (INDEPENDENT_AMBULATORY_CARE_PROVIDER_SITE_OTHER): Payer: Managed Care, Other (non HMO) | Admitting: Family Medicine

## 2016-05-03 ENCOUNTER — Other Ambulatory Visit: Payer: Self-pay

## 2016-05-03 VITALS — BP 123/81 | HR 82 | Temp 97.9°F | Wt 238.0 lb

## 2016-05-03 DIAGNOSIS — F3181 Bipolar II disorder: Secondary | ICD-10-CM

## 2016-05-03 DIAGNOSIS — R5383 Other fatigue: Secondary | ICD-10-CM

## 2016-05-03 MED ORDER — MELOXICAM 15 MG PO TABS: 15.0000 mg | ORAL_TABLET | Freq: Every day | ORAL | 0 refills | Status: DC

## 2016-05-03 NOTE — Telephone Encounter (Signed)
Patient needs a refill on Meloxicam.
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
## 2016-05-03 NOTE — Assessment & Plan Note (Signed)
Patient agreeable to psychiatry referral as she has failed multiple therapies and is currently on 4 psych medications with poor control. Continue counseling through work in the meantime as well.

## 2016-05-03 NOTE — Patient Instructions (Signed)
Follow up as needed

## 2016-05-04 ENCOUNTER — Encounter: Payer: Self-pay | Admitting: Family Medicine

## 2016-05-04 LAB — COMPREHENSIVE METABOLIC PANEL
ALT: 22 IU/L (ref 0–32)
AST: 22 IU/L (ref 0–40)
Albumin/Globulin Ratio: 1.7 (ref 1.2–2.2)
Albumin: 4.1 g/dL (ref 3.5–5.5)
Alkaline Phosphatase: 86 IU/L (ref 39–117)
BUN/Creatinine Ratio: 14 (ref 9–23)
BUN: 12 mg/dL (ref 6–24)
Bilirubin Total: 0.2 mg/dL (ref 0.0–1.2)
CO2: 25 mmol/L (ref 18–29)
Calcium: 8.8 mg/dL (ref 8.7–10.2)
Chloride: 99 mmol/L (ref 96–106)
Creatinine, Ser: 0.87 mg/dL (ref 0.57–1.00)
GFR calc Af Amer: 92 mL/min/{1.73_m2} (ref >59)
GFR calc non Af Amer: 80 mL/min/{1.73_m2} (ref >59)
Globulin, Total: 2.4 g/dL (ref 1.5–4.5)
Glucose: 86 mg/dL (ref 65–99)
Potassium: 4.3 mmol/L (ref 3.5–5.2)
Sodium: 138 mmol/L (ref 134–144)
Total Protein: 6.5 g/dL (ref 6.0–8.5)

## 2016-05-04 LAB — CBC WITH DIFFERENTIAL/PLATELET
Basophils Absolute: 0 10*3/uL (ref 0.0–0.2)
Basos: 0 %
EOS (ABSOLUTE): 0.3 10*3/uL (ref 0.0–0.4)
Eos: 2 %
Hematocrit: 36 % (ref 34.0–46.6)
Hemoglobin: 12.3 g/dL (ref 11.1–15.9)
Immature Grans (Abs): 0 10*3/uL (ref 0.0–0.1)
Immature Granulocytes: 0 %
Lymphocytes Absolute: 2.2 10*3/uL (ref 0.7–3.1)
Lymphs: 20 %
MCH: 29.1 pg (ref 26.6–33.0)
MCHC: 34.2 g/dL (ref 31.5–35.7)
MCV: 85 fL (ref 79–97)
Monocytes Absolute: 0.5 10*3/uL (ref 0.1–0.9)
Monocytes: 5 %
Neutrophils Absolute: 7.8 10*3/uL — ABNORMAL HIGH (ref 1.4–7.0)
Neutrophils: 73 %
Platelets: 242 10*3/uL (ref 150–379)
RBC: 4.23 x10E6/uL (ref 3.77–5.28)
RDW: 13.6 % (ref 12.3–15.4)
WBC: 10.8 10*3/uL (ref 3.4–10.8)

## 2016-05-04 LAB — TSH: TSH: 2.36 u[IU]/mL (ref 0.450–4.500)

## 2016-05-04 LAB — VITAMIN D 25 HYDROXY (VIT D DEFICIENCY, FRACTURES): Vit D, 25-Hydroxy: 47.1 ng/mL (ref 30.0–100.0)

## 2016-05-12 ENCOUNTER — Other Ambulatory Visit: Payer: Self-pay | Admitting: Family Medicine

## 2016-05-12 ENCOUNTER — Other Ambulatory Visit: Payer: Self-pay | Admitting: Unknown Physician Specialty

## 2016-05-12 NOTE — Telephone Encounter (Signed)
rx
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
## 2016-05-28 ENCOUNTER — Ambulatory Visit (INDEPENDENT_AMBULATORY_CARE_PROVIDER_SITE_OTHER): Payer: Managed Care, Other (non HMO) | Admitting: Psychiatry

## 2016-05-28 VITALS — BP 127/103 | HR 73 | Ht 62.99 in | Wt 236.6 lb

## 2016-05-28 DIAGNOSIS — Z79899 Other long term (current) drug therapy: Secondary | ICD-10-CM | POA: Diagnosis not present

## 2016-05-28 DIAGNOSIS — Z87891 Personal history of nicotine dependence: Secondary | ICD-10-CM

## 2016-05-28 DIAGNOSIS — F331 Major depressive disorder, recurrent, moderate: Secondary | ICD-10-CM | POA: Diagnosis not present

## 2016-05-28 DIAGNOSIS — R45851 Suicidal ideations: Secondary | ICD-10-CM

## 2016-05-28 DIAGNOSIS — Z818 Family history of other mental and behavioral disorders: Secondary | ICD-10-CM

## 2016-05-28 MED ORDER — BUPROPION HCL ER (XL) 450 MG PO TB24: 450.0000 mg | ORAL_TABLET | Freq: Every day | ORAL | 0 refills | Status: DC

## 2016-05-28 MED ORDER — LURASIDONE HCL 60 MG PO TABS: 60.0000 mg | ORAL_TABLET | Freq: Every day | ORAL | 0 refills | Status: DC

## 2016-05-28 NOTE — Patient Instructions (Signed)
1. Increase lurasidone 60 mg daily 2. Increase wellbutrin 450 mg daily 3. Continue fluoxetine 80 mg daily 4. Continue lorazepam 1 mg as needed for anxiety 4. Return to clinic in one month

## 2016-05-31 ENCOUNTER — Encounter: Payer: Managed Care, Other (non HMO) | Admitting: Unknown Physician Specialty

## 2016-06-01 ENCOUNTER — Telehealth (HOSPITAL_COMMUNITY): Payer: Self-pay | Admitting: *Deleted

## 2016-06-01 NOTE — Telephone Encounter (Signed)
Called pt and she stated pharmacy do not want to fill her medication. Called pharmacy and spoke with Shawn. Per Shawn, due to dose amount 450 mg, her insurance will only pay for brand name and the generic was sent to them. Per Shawn, due to this a PA is needed. Informed pt of this and pt verbalized understanding. Office is waiting for PA fax from pharmacy.

## 2016-06-01 NOTE — Telephone Encounter (Signed)
voice message from patient, said [facility] faxed over request.   Please call regarding.

## 2016-06-05 ENCOUNTER — Other Ambulatory Visit: Payer: Self-pay | Admitting: Family Medicine

## 2016-06-07 NOTE — Telephone Encounter (Signed)
Routing to provider  

## 2016-06-24 ENCOUNTER — Ambulatory Visit (INDEPENDENT_AMBULATORY_CARE_PROVIDER_SITE_OTHER): Payer: Managed Care, Other (non HMO) | Admitting: Psychiatry

## 2016-06-24 ENCOUNTER — Encounter (HOSPITAL_COMMUNITY): Payer: Self-pay | Admitting: Psychiatry

## 2016-06-24 VITALS — BP 136/76 | HR 69 | Ht 62.99 in | Wt 233.0 lb

## 2016-06-24 DIAGNOSIS — Z79899 Other long term (current) drug therapy: Secondary | ICD-10-CM

## 2016-06-24 DIAGNOSIS — Z888 Allergy status to other drugs, medicaments and biological substances status: Secondary | ICD-10-CM

## 2016-06-24 DIAGNOSIS — Z8249 Family history of ischemic heart disease and other diseases of the circulatory system: Secondary | ICD-10-CM

## 2016-06-24 DIAGNOSIS — Z818 Family history of other mental and behavioral disorders: Secondary | ICD-10-CM | POA: Diagnosis not present

## 2016-06-24 DIAGNOSIS — F331 Major depressive disorder, recurrent, moderate: Secondary | ICD-10-CM | POA: Diagnosis not present

## 2016-06-24 DIAGNOSIS — Z87891 Personal history of nicotine dependence: Secondary | ICD-10-CM | POA: Diagnosis not present

## 2016-06-24 MED ORDER — BUPROPION HCL ER (XL) 300 MG PO TB24
ORAL_TABLET | ORAL | 1 refills | Status: DC
Start: 2016-06-24 — End: 2016-09-13

## 2016-06-24 MED ORDER — LURASIDONE HCL 60 MG PO TABS: 60.0000 mg | ORAL_TABLET | Freq: Every day | ORAL | 1 refills | Status: DC

## 2016-06-24 MED ORDER — BUPROPION HCL ER (XL) 150 MG PO TB24: ORAL_TABLET | ORAL | 1 refills | Status: DC

## 2016-06-24 NOTE — Patient Instructions (Signed)
1.Continue lurasidone 60 mg daily 2 Continue Wellbutrin 450 mg daily 3. Continue fluoxetine 80 mg daily 4. Continue lorazepam 1 mg as needed for anxiety 5. Return to clinic in January 6. Please contact for therapy: Dr. Daisy BlossomSara W Schneidmiller  [phone], [place], [place] [id number]

## 2016-06-24 NOTE — Progress Notes (Signed)
BH MD/PA/NP OP Progress Note  06/24/2016 4:34 PM Tracie James  MRN:  [id number]  Chief Complaint:  Chief Complaint    Follow-up; Depression     Subjective:  "I'm doing good" HPI:  Patient states that she is doing better since the last appointment. She does not feel tired as she used to. She denies falling asleep while driving. 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 She denies any difficulty at work. She noticed that she has less crying spells compared to before. She does not feel "good" for a long time; reports she feels "ok."   She denies insomnia and sleeps 8 hours. She reports anhedonia, stating that she does not do scrap book or make cards as she used to. She bought [employer] and thinking of [employer] christmas cards. She denies SI, AH/VH. She feels anxious at times and took ativan three times since the last visit. He tends to feel irritable and easily gets upset and snaps at her children.   Visit Diagnosis:    ICD-9-CM ICD-10-CM   1. Major depressive disorder, recurrent episode, moderate (HCC) 296.32 F33.1      Past Psychiatric History:  Outpatient: Dx bipolar II disorder since 45 yo Psychiatry admission: [place] in [place] for 3 days in 2010 (does not remember details) Previous suicide attempt: overdoing pills last in 7 years  (did not seek medical care), denies SIB Past trials of medication: Cymbalta, Zoloft, Prozac, Lithium (sick), Depakote (spending money), Wellbutrin, Latuda, Abilify (did not work), Seroquel (weight gain, somnolence) Note:  She reports history of spending money worth $1,000 buying a gift in 1990. She denies decreased need for sleep, euphoria, talkativeness. She occasionally has creative ideas but reports she has not put those into action before.  Past Medical History:  Past Medical History:  Diagnosis Date  . Anxiety   . Asthma   . Bipolar 2 disorder (HCC)   . Depression   . GERD (gastroesophageal reflux disease)   . Palpitations   . PVC  (premature ventricular contraction)     Past Surgical History:  Procedure Laterality Date  . ABDOMINAL HYSTERECTOMY    . APPENDECTOMY      Family Psychiatric History:  Mother- depression, maternal grandmother- schizophrenia, denies suicide attempt  Family History:  Family History  Problem Relation Age of Onset  . Depression Mother   . Multiple sclerosis Mother   . Schizophrenia Maternal Grandmother   . Heart disease Maternal Grandmother     aortic valve replacement    Social History:  Social History   Social History  . Marital status: Divorced    Spouse name: N/A  . Number of children: N/A  . Years of education: N/A   Social History Main Topics  . Smoking status: Former Smoker    Quit date: 03/31/2014  . Smokeless tobacco: Never Used  . Alcohol use No  . Drug use: No  . Sexual activity: Yes    Birth control/ protection: Surgical   Other Topics Concern  . None   Social History Narrative  . 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  She moved from [place] to [place] in 2011 after divorce to be with her family. She has two girls, age 47 and 6911.   Allergies:  Allergies  Allergen Reactions  . Erythromycin Hives  . Tetracyclines & Related Hives    Metabolic Disorder Labs: No results found  for: HGBA1C, MPG No results found for: PROLACTIN No results found for: CHOL, TRIG, HDL, CHOLHDL, VLDL, LDLCALC   Current Medications: Current Outpatient Prescriptions  Medication Sig Dispense Refill  . buPROPion (WELLBUTRIN XL) 150 MG 24 hr tablet Take 300 mg daily and 150 mg daily (total of 450 mg) 30 tablet 1  . buPROPion (WELLBUTRIN XL) 300 MG 24 hr tablet Take 300 mg daily and 150 mg daily (total of 450 mg) 30 tablet 1  . FLUoxetine (PROZAC) 40 MG capsule TAKE 2 CAPSULES BY MOUTH DAILY 180 capsule 2  . lansoprazole (PREVACID) 15 MG capsule Take 15 mg by mouth daily at 12 noon.    Marland Kitchen LORazepam  (ATIVAN) 1 MG tablet Take 1/2 to 2 tablet twice a day as needed for anxiety 60 tablet 1  . Lurasidone HCl 60 MG TABS Take 1 tablet (60 mg total) by mouth daily with breakfast. 30 tablet 1  . meloxicam (MOBIC) 15 MG tablet TAKE 1 TABLET (15 MG TOTAL) BY MOUTH DAILY. 30 tablet 0  . metoprolol succinate (TOPROL-XL) 50 MG 24 hr tablet TAKE 1 TABLET (50 MG TOTAL) BY MOUTH DAILY. TAKE WITH OR IMMEDIATELY FOLLOWING A MEAL. 90 tablet 1  . vitamin B-12 (CYANOCOBALAMIN) 1000 MCG tablet Take 1,000 mcg by mouth daily.    . Vitamin D, Cholecalciferol, 1000 UNITS TABS Take 2,000 Units by mouth daily.     No current facility-administered medications for this visit.     Neurologic: Headache: No Seizure: No Paresthesias: No  Musculoskeletal: Strength & Muscle Tone: within normal limits Gait & Station: normal Patient leans: N/A  Psychiatric Specialty Exam: Review of Systems  Psychiatric/Behavioral: Positive for depression. Negative for hallucinations, substance abuse and suicidal ideas. The patient is nervous/anxious. The patient does not have insomnia.   All other systems reviewed and are negative.   Blood pressure 136/76, pulse 69, height 5' 2.99" (1.6 m), weight 233 lb (105.7 kg).Body mass index is 41.29 kg/m.  General Appearance: Fairly Groomed  Eye Contact:  Good  Speech:  Clear and Coherent  Volume:  Normal  Mood:  "Ok"  Affect:  Restricted - improving  Thought Process:  Coherent and Goal Directed  Orientation:  Full (Time, Place, and Person)  Thought Content: Logical Perceptions: denies AH/VH  Suicidal Thoughts:  No  Homicidal Thoughts:  No  Memory:  Immediate;   Good Recent;   Good Remote;   Good  Judgement:  Good  Insight:  Present  Psychomotor Activity:  Normal  Concentration:  Concentration: Good and Attention Span: Good  Recall:  Good  Fund of Knowledge: Good  Language: Good  Akathisia:  No  Handed:  Right  AIMS (if indicated):  N/A  Assets:  Communication Skills Desire  for Improvement  ADL's:  Intact  Cognition: WNL  Sleep:  good   Assessment Tracie James is a 45 year old female with depression, anxiety, who presents for follow up appointment. Psychosocial stressors including divorce in 2011, although she is unable to identify any recent triggers.   # MDD  # r/o MDD with mixed episode There has been an improvement in her neurovegetative symptoms since the last encounter. Will continue her current medication. Of note, exam is notable for her relatively limited insight into her current mental state and psychosocial stressors; she is unable to elaborate her story. It is unclear whether this is her baseline or impacted by depression. Will continue to explore. Will make referral for therapy for anger management. Noted that she does not have  significant manic/hypomanic episode except irritability and history of spending money once in the past. Her presentation is more consistent with MDD (rather than bipolar disorder as in history).   # Insomnia There has been significant improvement after starting lurasidone. Will continue to monitor.   Plan 1.Continue lurasidone 60 mg daily 2 Continue Wellbutrin 450 mg daily 3. Continue fluoxetine 80 mg daily 4. Continue lorazepam 1 mg as needed for anxiety 5. Return to clinic in January 6. Please contact for therapy: Dr. Daisy BlossomSara W Schneidmiller  [phone] [address], [place], [place] [address]  The patient demonstrates the following risk factors for suicide: Chronic risk factors for suicide include: psychiatric disorder of depression and previous suicide attempts of overdosing medication. Acute risk factors for suicide include: family or marital conflict. Protective factors for this patient include: responsibility to others (children, family), coping skills and hope for the future. Patient is future oriented and agrees to come for next appointment. Considering these factors, the overall suicide risk at this point  appears to be low. Patient is appropriate for outpatient follow up. Emergency resources including crisis call, 911 and ED are discussed.   Treatment Plan Summary:Plan as above   [name] Any Mcneice, MD 06/24/2016, 4:34 PM

## 2016-06-28 ENCOUNTER — Telehealth (HOSPITAL_COMMUNITY): Payer: Self-pay | Admitting: *Deleted

## 2016-06-28 NOTE — Telephone Encounter (Signed)
prior auth. needed for [place]uda [phone]

## 2016-06-28 NOTE — Telephone Encounter (Signed)
[facility] [phone].

## 2016-07-01 ENCOUNTER — Telehealth (HOSPITAL_COMMUNITY): Payer: Self-pay | Admitting: *Deleted

## 2016-07-01 NOTE — Telephone Encounter (Signed)
Prior authorization for Latuda received. Called [phone] spoke with Lyman BishopLawrence B. Who gave approval #[id number] until 07/01/17. Called to notify pharmacy.

## 2016-07-02 NOTE — Telephone Encounter (Signed)
noted 

## 2016-07-04 ENCOUNTER — Other Ambulatory Visit: Payer: Self-pay | Admitting: Family Medicine

## 2016-07-26 ENCOUNTER — Telehealth (HOSPITAL_COMMUNITY): Payer: Self-pay | Admitting: *Deleted

## 2016-07-26 NOTE — Telephone Encounter (Signed)
Dr. Bing MatterHisada's pt pharmacy is requesting refills for pt Tracie James 60 mg QD. Pt medication last filled on 06-24-2016 with 30 tabs 1 refill. Pt pharmacy number is [phone].

## 2016-07-27 NOTE — Telephone Encounter (Signed)
It obviously has a refill

## 2016-07-28 NOTE — Telephone Encounter (Signed)
noted 

## 2016-08-06 NOTE — Progress Notes (Signed)
BH MD/PA/NP OP Progress Note  08/10/2016 4:25 PM Tracie James  MRN:  [id number]  Chief Complaint:  Chief Complaint    Follow-up; Depression     Subjective:  "I'm doing pretty good" HPI:  Patient comes in for follow-up appointment. She feels much better compared to the last time. She does not feel tired in the morning at all, although she had some episode of fatigue a few times. She has not used any Ativan since the last appointment. She denies any concern at work, stating that she has been trying to avoid being with one person. She has occasional anxiety.  She denies crying spells. She denies SI, AH/VH.    Visit Diagnosis:    ICD-9-CM ICD-10-CM   1. Major depressive disorder, recurrent episode, moderate (HCC) 296.32 F33.1      Past Psychiatric History:  Outpatient: Dx bipolar II disorder since 46 yo Psychiatry admission: SI in [place] for 3 days in 2010 (does not remember details) Previous suicide attempt: overdoing pills last in 7 years  (did not seek medical care), denies SIB Past trials of medication: Cymbalta, Zoloft, Prozac, Lithium (sick), Depakote (spending money), Wellbutrin, Latuda, Abilify (did not work), Seroquel (weight gain, somnolence) Note:  She reports history of spending money worth $1,000 buying a gift in 1990. She denies decreased need for sleep, euphoria, talkativeness. She occasionally has creative ideas but reports she has not put those into action before.  Past Medical History:  Past Medical History:  Diagnosis Date  . Anxiety   . Asthma   . Bipolar 2 disorder (HCC)   . Depression   . GERD (gastroesophageal reflux disease)   . Palpitations   . PVC (premature ventricular contraction)     Past Surgical History:  Procedure Laterality Date  . ABDOMINAL HYSTERECTOMY    . APPENDECTOMY      Family Psychiatric History:  Mother- depression, maternal grandmother- schizophrenia, denies suicide attempt  Family History:  Family History  Problem Relation  Age of Onset  . Depression Mother   . Multiple sclerosis Mother   . Schizophrenia Maternal Grandmother   . Heart disease Maternal Grandmother     aortic valve replacement    Social History:  Social History   Social History  . Marital status: Divorced    Spouse name: N/A  . Number of children: N/A  . Years of education: N/A   Social History Main Topics  . Smoking status: Former Smoker    Quit date: 03/31/2014  . Smokeless tobacco: Never Used  . Alcohol use No  . Drug use: No  . Sexual activity: Yes    Birth control/ protection: Surgical   Other Topics Concern  . None   Social History Narrative  . 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  She moved from [place] to [place] in 2011 after divorce to be with her family. She has two girls, age 64 and 37.   Allergies:  Allergies  Allergen Reactions  . Erythromycin Hives  . Tetracyclines & Related Hives    Metabolic Disorder Labs: No results found for: HGBA1C, MPG No results found for: PROLACTIN No results found for: CHOL, TRIG, HDL, CHOLHDL, VLDL, LDLCALC   Current Medications: Current Outpatient Prescriptions  Medication Sig Dispense Refill  . buPROPion (WELLBUTRIN XL) 150 MG 24 hr tablet Take 300 mg daily and 150 mg daily (total of 450 mg) 30 tablet 1  .  buPROPion (WELLBUTRIN XL) 300 MG 24 hr tablet Take 300 mg daily and 150 mg daily (total of 450 mg) 30 tablet 1  . FLUoxetine (PROZAC) 40 MG capsule TAKE 2 CAPSULES BY MOUTH DAILY 180 capsule 2  . lansoprazole (PREVACID) 15 MG capsule Take 15 mg by mouth daily at 12 noon.    Marland Kitchen. LORazepam (ATIVAN) 1 MG tablet Take 1/2 to 2 tablet twice a day as needed for anxiety 60 tablet 1  . Lurasidone HCl 60 MG TABS Take 1 tablet (60 mg total) by mouth daily with breakfast. 90 tablet 0  . meloxicam (MOBIC) 15 MG tablet TAKE 1 TABLET (15 MG TOTAL) BY MOUTH DAILY. 30 tablet 0  . metoprolol succinate (TOPROL-XL)  50 MG 24 hr tablet TAKE 1 TABLET (50 MG TOTAL) BY MOUTH DAILY. TAKE WITH OR IMMEDIATELY FOLLOWING A MEAL. 90 tablet 1  . vitamin B-12 (CYANOCOBALAMIN) 1000 MCG tablet Take 1,000 mcg by mouth daily.    . Vitamin D, Cholecalciferol, 1000 UNITS TABS Take 5,000 Units by mouth daily.      No current facility-administered medications for this visit.     Neurologic: Headache: No Seizure: No Paresthesias: No  Musculoskeletal: Strength & Muscle Tone: within normal limits Gait & Station: normal Patient leans: N/A  Psychiatric Specialty Exam: Review of Systems  Psychiatric/Behavioral: Negative for depression, hallucinations, substance abuse and suicidal ideas. The patient is nervous/anxious. The patient does not have insomnia.   All other systems reviewed and are negative.   Blood pressure 112/85, pulse 82, height 5' 2.99" (1.6 m), weight 227 lb (103 kg).Body mass index is 40.22 kg/m.  General Appearance: Fairly Groomed  Eye Contact:  Good  Speech:  Clear and Coherent  Volume:  Normal  Mood:  "pretty good"  Affect:  Appropriate, Congruent and Full Range   Thought Process:  Coherent and Goal Directed  Orientation:  Full (Time, Place, and Person)  Thought Content: Logical Perceptions: denies AH/VH  Suicidal Thoughts:  No  Homicidal Thoughts:  No  Memory:  Immediate;   Good Recent;   Good Remote;   Good  Judgement:  Good  Insight:  Present  Psychomotor Activity:  Normal  Concentration:  Concentration: Good and Attention Span: Good  Recall:  Good  Fund of Knowledge: Good  Language: Good  Akathisia:  No  Handed:  Right  AIMS (if indicated):  N/A  Assets:  Communication Skills Desire for Improvement  ADL's:  Intact  Cognition: WNL  Sleep:  good   Assessment Tracie James is a 46 year old female with depression, anxiety, who presents for follow up appointment. Psychosocial stressors including divorce in 2011, although she is unable to identify any recent triggers.   # MDD   # r/o MDD with mixed episode Exam is notable for her brighter affect, and there has been an improvement in her neurovegetative symptoms since uptitration of lurasidone and Wellbutrin. Will continue her current medication. Patient is encouraged to contact therapist for CBT. Noted that she does not have significant manic/hypomanic episode except irritability and history of spending money once in the past. Her presentation is more consistent with MDD (rather than bipolar disorder as in history).   # Insomnia There has been significant improvement after starting lurasidone. Will continue to monitor.   Plan 1.Continue lurasidone 60 mg daily 2 Continue Wellbutrin 450 mg daily 3. Continue fluoxetine 80 mg daily 4. Continue lorazepam 1 mg as needed for anxiety 5. Return to clinic in one month 6. Please contact for therapy:  Dr. Daisy Blossom Schneidmiller  [phone] [address], [place], [place] [address]  The patient demonstrates the following risk factors for suicide: Chronic risk factors for suicide include: psychiatric disorder of depression and previous suicide attempts of overdosing medication. Acute risk factors for suicide include: family or marital conflict. Protective factors for this patient include: responsibility to others (children, family), coping skills and hope for the future. Patient is future oriented and agrees to come for next appointment. Considering these factors, the overall suicide risk at this point appears to be low. Patient is appropriate for outpatient follow up. Emergency resources including crisis call, 911 and ED are discussed.   Treatment Plan Summary:Plan as above   [name], MD 08/10/2016, 4:25 PM

## 2016-08-09 ENCOUNTER — Telehealth (HOSPITAL_COMMUNITY): Payer: Self-pay | Admitting: *Deleted

## 2016-08-09 ENCOUNTER — Other Ambulatory Visit: Payer: Self-pay | Admitting: Unknown Physician Specialty

## 2016-08-09 NOTE — Telephone Encounter (Signed)
Pt pharmacy requesting 90 days supply for pt Latuda 60 mg. Pt medication was last refills on 06-24-2016 with 30 tabs 1 refill. Pt f/u appt is 08-10-2016. [facility] [place] [facility] [place] and number is [phone]

## 2016-08-10 ENCOUNTER — Telehealth (HOSPITAL_COMMUNITY): Payer: Self-pay | Admitting: *Deleted

## 2016-08-10 ENCOUNTER — Encounter (HOSPITAL_COMMUNITY): Payer: Self-pay | Admitting: Psychiatry

## 2016-08-10 ENCOUNTER — Ambulatory Visit (INDEPENDENT_AMBULATORY_CARE_PROVIDER_SITE_OTHER): Payer: Managed Care, Other (non HMO) | Admitting: Psychiatry

## 2016-08-10 ENCOUNTER — Other Ambulatory Visit (HOSPITAL_COMMUNITY): Payer: Self-pay | Admitting: Psychiatry

## 2016-08-10 VITALS — BP 112/85 | HR 82 | Ht 62.99 in | Wt 227.0 lb

## 2016-08-10 DIAGNOSIS — Z87891 Personal history of nicotine dependence: Secondary | ICD-10-CM | POA: Diagnosis not present

## 2016-08-10 DIAGNOSIS — Z8249 Family history of ischemic heart disease and other diseases of the circulatory system: Secondary | ICD-10-CM

## 2016-08-10 DIAGNOSIS — Z79899 Other long term (current) drug therapy: Secondary | ICD-10-CM

## 2016-08-10 DIAGNOSIS — Z818 Family history of other mental and behavioral disorders: Secondary | ICD-10-CM | POA: Diagnosis not present

## 2016-08-10 DIAGNOSIS — Z888 Allergy status to other drugs, medicaments and biological substances status: Secondary | ICD-10-CM

## 2016-08-10 DIAGNOSIS — F331 Major depressive disorder, recurrent, moderate: Secondary | ICD-10-CM

## 2016-08-10 MED ORDER — LURASIDONE HCL 60 MG PO TABS: 60.0000 mg | ORAL_TABLET | Freq: Every day | ORAL | 0 refills | Status: DC

## 2016-08-10 NOTE — Telephone Encounter (Signed)
Called pt and lmtcb and office number provided.  

## 2016-08-10 NOTE — Patient Instructions (Signed)
1.Continue lurasidone 60 mg daily 2 Continue Wellbutrin 450 mg daily 3. Continue fluoxetine 80 mg daily 4. Continue lorazepam 1 mg as needed for anxiety 5. Return to clinic in one month 6. Please contact for therapy: Dr. Daisy BlossomSara W Schneidmiller  [phone] [address], [place], [place] [address]

## 2016-08-10 NOTE — Telephone Encounter (Signed)
Patient asking for prior authorization for Wellbutrin 450mg  due to not wanting to take 2 pills for correct dosage. Staff will need to notify patient that Wellbutrin does not come in 450mg  dosage and must be given in 300mg  and 150mg  to achieve correct dosage.

## 2016-08-10 NOTE — Telephone Encounter (Signed)
noted 

## 2016-08-10 NOTE — Telephone Encounter (Signed)
Done

## 2016-08-16 ENCOUNTER — Other Ambulatory Visit: Payer: Self-pay | Admitting: Family Medicine

## 2016-08-17 NOTE — Telephone Encounter (Signed)
Routing to provider. This med is listed under outside meds.

## 2016-08-20 ENCOUNTER — Telehealth (HOSPITAL_COMMUNITY): Payer: Self-pay | Admitting: *Deleted

## 2016-08-20 NOTE — Telephone Encounter (Signed)
Office received approval from [employer] for pt Latuda. Pt is approved from 07-01-2016 until 07-01-2017.

## 2016-09-02 NOTE — Progress Notes (Deleted)
BH MD/PA/NP OP Progress Note  09/02/2016 2:04 PM Silvio Claymanimee Maciborski  MRN:  [id number]  Chief Complaint:   Subjective:  "I'm doing pretty good" HPI:  Patient comes in for follow-up appointment. She feels much better compared to the last time. She does not feel tired in the morning at all, although she had some episode of fatigue a few times. She has not used any Ativan since the last appointment. She denies any concern at work, stating that she has been trying to avoid being with one person. She has occasional anxiety.  She denies crying spells. She denies SI, AH/VH.    Visit Diagnosis:  No diagnosis found.   Past Psychiatric History:  Outpatient: Dx bipolar II disorder since 46 yo Psychiatry admission: SI in [place] for 3 days in 2010 (does not remember details) Previous suicide attempt: overdoing pills last in 7 years  (did not seek medical care), denies SIB Past trials of medication: Cymbalta, Zoloft, Prozac, Lithium (sick), Depakote (spending money), Wellbutrin, Latuda, Abilify (did not work), Seroquel (weight gain, somnolence) Note:  She reports history of spending money worth $1,000 buying a gift in 1990. She denies decreased need for sleep, euphoria, talkativeness. She occasionally has creative ideas but reports she has not put those into action before.  Past Medical History:  Past Medical History:  Diagnosis Date  . Anxiety   . Asthma   . Bipolar 2 disorder (HCC)   . Depression   . GERD (gastroesophageal reflux disease)   . Palpitations   . PVC (premature ventricular contraction)     Past Surgical History:  Procedure Laterality Date  . ABDOMINAL HYSTERECTOMY    . APPENDECTOMY      Family Psychiatric History:  Mother- depression, maternal grandmother- schizophrenia, denies suicide attempt  Family History:  Family History  Problem Relation Age of Onset  . Depression Mother   . Multiple sclerosis Mother   . Schizophrenia Maternal Grandmother   . Heart disease Maternal  Grandmother     aortic valve replacement    Social History:  Social History   Social History  . Marital status: Divorced    Spouse name: N/A  . Number of children: N/A  . Years of education: N/A   Social History Main Topics  . Smoking status: Former Smoker    Quit date: 03/31/2014  . Smokeless tobacco: Never Used  . Alcohol use No  . Drug use: No  . Sexual activity: Yes    Birth control/ protection: Surgical   Other Topics Concern  . Not on file   Social History Narrative  . 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  She moved from [place] to [place] in 2011 after divorce to be with her family. She has two girls, age 48 and 7311.   Allergies:  Allergies  Allergen Reactions  . Erythromycin Hives  . Tetracyclines & Related Hives    Metabolic Disorder Labs: No results found for: HGBA1C, MPG No results found for: PROLACTIN No results found for: CHOL, TRIG, HDL, CHOLHDL, VLDL, LDLCALC   Current Medications: Current Outpatient Prescriptions  Medication Sig Dispense Refill  . buPROPion (WELLBUTRIN XL) 150 MG 24 hr tablet Take 300 mg daily and 150 mg daily (total of 450 mg) 30 tablet 1  . buPROPion (WELLBUTRIN XL) 300 MG 24 hr tablet Take 300 mg daily and 150 mg daily (total of 450 mg) 30  tablet 1  . FLUoxetine (PROZAC) 40 MG capsule TAKE 2 CAPSULES BY MOUTH DAILY 180 capsule 2  . lansoprazole (PREVACID) 15 MG capsule Take 15 mg by mouth daily at 12 noon.    Marland Kitchen LORazepam (ATIVAN) 1 MG tablet Take 1/2 to 2 tablet twice a day as needed for anxiety 60 tablet 1  . Lurasidone HCl 60 MG TABS Take 1 tablet (60 mg total) by mouth daily with breakfast. 90 tablet 0  . meloxicam (MOBIC) 15 MG tablet TAKE 1 TABLET (15 MG TOTAL) BY MOUTH DAILY. 30 tablet 0  . methocarbamol (ROBAXIN) 750 MG tablet TAKE 1 TABLET (750 MG TOTAL) BY MOUTH 4 (FOUR) TIMES DAILY AS NEEDED. 30 tablet 0  . metoprolol  succinate (TOPROL-XL) 50 MG 24 hr tablet TAKE 1 TABLET (50 MG TOTAL) BY MOUTH DAILY. TAKE WITH OR IMMEDIATELY FOLLOWING A MEAL. 90 tablet 1  . vitamin B-12 (CYANOCOBALAMIN) 1000 MCG tablet Take 1,000 mcg by mouth daily.    . Vitamin D, Cholecalciferol, 1000 UNITS TABS Take 5,000 Units by mouth daily.      No current facility-administered medications for this visit.     Neurologic: Headache: No Seizure: No Paresthesias: No  Musculoskeletal: Strength & Muscle Tone: within normal limits Gait & Station: normal Patient leans: N/A  Psychiatric Specialty Exam: Review of Systems  Psychiatric/Behavioral: Negative for depression, hallucinations, substance abuse and suicidal ideas. The patient is nervous/anxious. The patient does not have insomnia.   All other systems reviewed and are negative.   There were no vitals taken for this visit.There is no height or weight on file to calculate BMI.  General Appearance: Fairly Groomed  Eye Contact:  Good  Speech:  Clear and Coherent  Volume:  Normal  Mood:  "pretty good"  Affect:  Appropriate, Congruent and Full Range   Thought Process:  Coherent and Goal Directed  Orientation:  Full (Time, Place, and Person)  Thought Content: Logical Perceptions: denies AH/VH  Suicidal Thoughts:  No  Homicidal Thoughts:  No  Memory:  Immediate;   Good Recent;   Good Remote;   Good  Judgement:  Good  Insight:  Present  Psychomotor Activity:  Normal  Concentration:  Concentration: Good and Attention Span: Good  Recall:  Good  Fund of Knowledge: Good  Language: Good  Akathisia:  No  Handed:  Right  AIMS (if indicated):  N/A  Assets:  Communication Skills Desire for Improvement  ADL's:  Intact  Cognition: WNL  Sleep:  good   Assessment Tanganyika Jones [place] is a 46 year old female with depression, anxiety, who presents for follow up appointment. Psychosocial stressors including divorce in 2011, although she is unable to identify any recent triggers.   #  MDD  # r/o MDD with mixed episode Exam is notable for her brighter affect, and there has been an improvement in her neurovegetative symptoms since uptitration of lurasidone and Wellbutrin. Will continue her current medication. Patient is encouraged to contact therapist for CBT. Noted that she does not have significant manic/hypomanic episode except irritability and history of spending money once in the past. Her presentation is more consistent with MDD (rather than bipolar disorder as in history).   # Insomnia There has been significant improvement after starting lurasidone. Will continue to monitor.   Plan 1.Continue lurasidone 60 mg daily 2 Continue Wellbutrin 450 mg daily 3. Continue fluoxetine 80 mg daily 4. Continue lorazepam 1 mg as needed for anxiety 5. Return to clinic in one month 6. Please contact for  therapy: Dr. Daisy Blossom Schneidmiller  [phone] [address] [place], [place] [address]  The patient demonstrates the following risk factors for suicide: Chronic risk factors for suicide include: psychiatric disorder of depression and previous suicide attempts of overdosing medication. Acute risk factors for suicide include: family or marital conflict. Protective factors for this patient include: responsibility to others (children, family), coping skills and hope for the future. Patient is future oriented and agrees to come for next appointment. Considering these factors, the overall suicide risk at this point appears to be low. Patient is appropriate for outpatient follow up. Emergency resources including crisis call, 911 and ED are discussed.   Treatment Plan Summary:Plan as above   [name], MD 09/02/2016, 2:04 PM

## 2016-09-07 ENCOUNTER — Ambulatory Visit (HOSPITAL_COMMUNITY): Payer: Self-pay | Admitting: Psychiatry

## 2016-09-09 NOTE — Progress Notes (Signed)
BH MD/PA/NP OP Progress Note  09/13/2016 4:42 PM Tracie James  MRN:  [id number]  Chief Complaint:  Chief Complaint    Follow-up; Depression; Anxiety     Subjective:  "I'm doing good" HPI:  Patient comes in for follow-up appointment. She states that she is doing well since the last visit. She feels less fatigued, although she sleeps most of the time on weekends to restore energy from weekday (this is chronic issues). She talks about her concern about her ex-husband, who she believes is trying to get back on her children's custody. She has immediate "negative thoughts" when she had contact with him. She takes Ativan 3-4 times per week for anxiety. She denies crying spells. She denies SI, AH/VH.    Visit Diagnosis:    ICD-9-CM ICD-10-CM   1. Major depressive disorder, recurrent episode, moderate (HCC) 296.32 F33.1      Past Psychiatric History:  Outpatient: Dx bipolar II disorder since 46 yo Psychiatry admission: SI in [place] for 3 days in 2010 (does not remember details) Previous suicide attempt: overdoing pills last in 7 years  (did not seek medical care), denies SIB Past trials of medication: Cymbalta, Zoloft, Prozac, Lithium (sick), Depakote (spending money), Wellbutrin, Latuda, Abilify (did not work), Seroquel (weight gain, somnolence) Note:  She reports history of spending money worth $1,000 buying a gift in 1990. She denies decreased need for sleep, euphoria, talkativeness. She occasionally has creative ideas but reports she has not put those into action before.  Past Medical History:  Past Medical History:  Diagnosis Date  . Anxiety   . Asthma   . Bipolar 2 disorder (HCC)   . Depression   . GERD (gastroesophageal reflux disease)   . Palpitations   . PVC (premature ventricular contraction)     Past Surgical History:  Procedure Laterality Date  . ABDOMINAL HYSTERECTOMY    . APPENDECTOMY      Family Psychiatric History:  Mother- depression, maternal grandmother-  schizophrenia, denies suicide attempt  Family History:  Family History  Problem Relation Age of Onset  . Depression Mother   . Multiple sclerosis Mother   . Schizophrenia Maternal Grandmother   . Heart disease Maternal Grandmother     aortic valve replacement    Social History:  Social History   Social History  . Marital status: Divorced    Spouse name: N/A  . Number of children: N/A  . Years of education: N/A   Social History Main Topics  . Smoking status: Former Smoker    Quit date: 03/31/2014  . Smokeless tobacco: Never Used  . Alcohol use No  . Drug use: No  . Sexual activity: Yes    Birth control/ protection: Surgical   Other Topics Concern  . Not on file   Social History Narrative  . 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  She moved from [place] to [place] in 2011 after divorce to be with her family. She has two girls, age 34 and 32.   Allergies:  Allergies  Allergen Reactions  . Erythromycin Hives  . Tetracyclines & Related Hives    Metabolic Disorder Labs: No results found for: HGBA1C, MPG No results found for: PROLACTIN No results found for: CHOL, TRIG, HDL, CHOLHDL, VLDL, LDLCALC   Current Medications: Current Outpatient Prescriptions  Medication Sig Dispense Refill  . buPROPion (WELLBUTRIN XL) 150 MG 24 hr tablet Take  300 mg daily and 150 mg daily (total of 450 mg) 30 tablet 1  . buPROPion (WELLBUTRIN XL) 300 MG 24 hr tablet Take 300 mg daily and 150 mg daily (total of 450 mg) 30 tablet 1  . FLUoxetine (PROZAC) 40 MG capsule TAKE 2 CAPSULES BY MOUTH DAILY 180 capsule 2  . lansoprazole (PREVACID) 15 MG capsule Take 15 mg by mouth daily at 12 noon.    Marland Kitchen. LORazepam (ATIVAN) 1 MG tablet Take 1 tablet (1 mg total) by mouth daily as needed for anxiety. 30 tablet 0  . Lurasidone HCl 60 MG TABS Take 1 tablet (60 mg total) by mouth daily with breakfast. 90 tablet 0   . meloxicam (MOBIC) 15 MG tablet TAKE 1 TABLET (15 MG TOTAL) BY MOUTH DAILY. 30 tablet 0  . methocarbamol (ROBAXIN) 750 MG tablet TAKE 1 TABLET (750 MG TOTAL) BY MOUTH 4 (FOUR) TIMES DAILY AS NEEDED. 30 tablet 0  . metoprolol succinate (TOPROL-XL) 50 MG 24 hr tablet TAKE 1 TABLET (50 MG TOTAL) BY MOUTH DAILY. TAKE WITH OR IMMEDIATELY FOLLOWING A MEAL. 90 tablet 1  . vitamin B-12 (CYANOCOBALAMIN) 1000 MCG tablet Take 1,000 mcg by mouth daily.    . Vitamin D, Cholecalciferol, 1000 UNITS TABS Take 5,000 Units by mouth daily.      No current facility-administered medications for this visit.     Neurologic: Headache: No Seizure: No Paresthesias: No  Musculoskeletal: Strength & Muscle Tone: within normal limits Gait & Station: normal Patient leans: N/A  Psychiatric Specialty Exam: Review of Systems  Psychiatric/Behavioral: Negative for depression, hallucinations, substance abuse and suicidal ideas. The patient is nervous/anxious. The patient does not have insomnia.   All other systems reviewed and are negative.   Blood pressure 117/63, pulse 70, weight 227 lb 9.6 oz (103.2 kg).Body mass index is 40.33 kg/m.  General Appearance: Fairly Groomed  Eye Contact:  Good  Speech:  Clear and Coherent  Volume:  Normal  Mood:  "better"  Affect:  Appropriate, Congruent and Full Range   Thought Process:  Coherent and Goal Directed  Orientation:  Full (Time, Place, and Person)  Thought Content: Logical Perceptions: denies AH/VH  Suicidal Thoughts:  No  Homicidal Thoughts:  No  Memory:  Immediate;   Good Recent;   Good Remote;   Good  Judgement:  Good  Insight:  Fair  Psychomotor Activity:  Normal  Concentration:  Concentration: Good and Attention Span: Good  Recall:  Good  Fund of Knowledge: Good  Language: Good  Akathisia:  No  Handed:  Right  AIMS (if indicated):  N/A  Assets:  Communication Skills Desire for Improvement  ADL's:  Intact  Cognition: WNL  Sleep:  good    Assessment Tracie James is a 46 year old female with depression, anxiety, who presents for follow up appointment. Psychosocial stressors including divorce in 2011, and discordance with her ex-husband over custody issues.  # MDD  # r/o MDD with mixed episode She responded very well to increasing lurasidone, Wellbutrin and she denies significant neurovegetative symptoms except occasional anxiety. She is encouraged to see therapist for CBT. Noted that although she has a history of bipolar disorder per chart, she does not have significant manic/hypomanic episode except irritability and history of spending money once in the past. Her presentation is more consistent with MDD.  # Insomnia There has been significant improvement after starting lurasidone. Will continue to monitor.   Plan 1.Continue lurasidone 60 mg daily 2 Continue Wellbutrin 450 mg daily 3.  Continue fluoxetine 80 mg daily 4. Continue lorazepam 1 mg as needed for anxiety 5. Return to clinic in one month 6. Please contact for therapy: Dr. Daisy Blossom Schneidmiller  [phone] [address], [place], [place] [address]  The patient demonstrates the following risk factors for suicide: Chronic risk factors for suicide include: psychiatric disorder of depression and previous suicide attempts of overdosing medication. Acute risk factors for suicide include: family or marital conflict. Protective factors for this patient include: responsibility to others (children, family), coping skills and hope for the future. Patient is future oriented and agrees to come for next appointment. Considering these factors, the overall suicide risk at this point appears to be low. Patient is appropriate for outpatient follow up. Emergency resources including crisis call, 911 and ED are discussed.   Treatment Plan Summary:Plan as above   [name], MD 09/13/2016, 4:42 PM

## 2016-09-13 ENCOUNTER — Ambulatory Visit (INDEPENDENT_AMBULATORY_CARE_PROVIDER_SITE_OTHER): Payer: Managed Care, Other (non HMO) | Admitting: Psychiatry

## 2016-09-13 VITALS — BP 117/63 | HR 70 | Wt 227.6 lb

## 2016-09-13 DIAGNOSIS — F17210 Nicotine dependence, cigarettes, uncomplicated: Secondary | ICD-10-CM

## 2016-09-13 DIAGNOSIS — Z888 Allergy status to other drugs, medicaments and biological substances status: Secondary | ICD-10-CM

## 2016-09-13 DIAGNOSIS — Z8269 Family history of other diseases of the musculoskeletal system and connective tissue: Secondary | ICD-10-CM | POA: Diagnosis not present

## 2016-09-13 DIAGNOSIS — G4700 Insomnia, unspecified: Secondary | ICD-10-CM | POA: Diagnosis not present

## 2016-09-13 DIAGNOSIS — Z79899 Other long term (current) drug therapy: Secondary | ICD-10-CM

## 2016-09-13 DIAGNOSIS — Z9889 Other specified postprocedural states: Secondary | ICD-10-CM | POA: Diagnosis not present

## 2016-09-13 DIAGNOSIS — Z90710 Acquired absence of both cervix and uterus: Secondary | ICD-10-CM | POA: Diagnosis not present

## 2016-09-13 DIAGNOSIS — Z818 Family history of other mental and behavioral disorders: Secondary | ICD-10-CM

## 2016-09-13 DIAGNOSIS — F419 Anxiety disorder, unspecified: Secondary | ICD-10-CM | POA: Diagnosis not present

## 2016-09-13 DIAGNOSIS — Z8249 Family history of ischemic heart disease and other diseases of the circulatory system: Secondary | ICD-10-CM

## 2016-09-13 DIAGNOSIS — F331 Major depressive disorder, recurrent, moderate: Secondary | ICD-10-CM

## 2016-09-13 MED ORDER — LURASIDONE HCL 60 MG PO TABS: 60.0000 mg | ORAL_TABLET | Freq: Every day | ORAL | 0 refills | Status: DC

## 2016-09-13 MED ORDER — BUPROPION HCL ER (XL) 300 MG PO TB24: ORAL_TABLET | ORAL | 1 refills | Status: DC

## 2016-09-13 MED ORDER — BUPROPION HCL ER (XL) 150 MG PO TB24: ORAL_TABLET | ORAL | 1 refills | Status: DC

## 2016-09-13 MED ORDER — LORAZEPAM 1 MG PO TABS: 1.0000 mg | ORAL_TABLET | Freq: Every day | ORAL | 0 refills | Status: DC | PRN

## 2016-09-13 NOTE — Patient Instructions (Addendum)
1.Continue lurasidone 60 mg daily 2 Continue Wellbutrin 450 mg daily 3. Continue fluoxetine 80 mg daily 4. Continue lorazepam 1 mg as needed for anxiety 5. Return to clinic in one month 6. Contact for cognitive behavioral therapy: Dr. Daisy BlossomSara W Schneidmiller  [phone] [address], [place], [place] [address]

## 2016-10-06 ENCOUNTER — Other Ambulatory Visit (HOSPITAL_COMMUNITY): Payer: Self-pay | Admitting: Psychiatry

## 2016-10-06 ENCOUNTER — Telehealth (HOSPITAL_COMMUNITY): Payer: Self-pay | Admitting: *Deleted

## 2016-10-06 MED ORDER — BUPROPION HCL ER (XL) 150 MG PO TB24: ORAL_TABLET | ORAL | 0 refills | Status: DC

## 2016-10-06 MED ORDER — BUPROPION HCL ER (XL) 300 MG PO TB24: ORAL_TABLET | ORAL | 0 refills | Status: DC

## 2016-10-06 NOTE — Telephone Encounter (Signed)
Pt pharmacy requesting 90 days supply for pt Bupropion 150 and 300 mg due to insurance. Pt medications last filled on 09-13-2016. Pt pharmacy number is [phone]

## 2016-10-06 NOTE — Telephone Encounter (Signed)
Ordered

## 2016-10-06 NOTE — Telephone Encounter (Signed)
noted 

## 2016-10-07 NOTE — Progress Notes (Signed)
BH MD/PA/NP OP Progress Note  10/11/2016 4:48 PM Tracie James  MRN:  [id number]  Chief Complaint:  Chief Complaint    Follow-up; Depression     Subjective:  "I'm fine." HPI:  Patient comes in for follow-up appointment. She reports no change since the last appointment and feels good except the time she interacts with her ex-husband. She feels frustrated/irritated about him, although he would have a phone call to her child. She has not taken Ativan for a while for her anxiety, although she likely needs to take one when she talks with her ex-husband. She denies feeling depressed. She denies SI. She denies decreased need for sleep or euphoria.   Visit Diagnosis:    ICD-9-CM ICD-10-CM   1. Major depressive disorder, recurrent episode, moderate (HCC) 296.32 F33.1      Past Psychiatric History:  Outpatient: Dx bipolar II disorder since 46 yo Psychiatry admission: SI in [place] for 3 days in 2010 (does not remember details) Previous suicide attempt: overdoing pills last in 7 years  (did not seek medical care), denies SIB Past trials of medication: Cymbalta, Zoloft, Prozac, Lithium (sick), Depakote (spending money), Wellbutrin, Latuda, Abilify (did not work), Seroquel (weight gain, somnolence) Note:  She reports history of spending money worth $1,000 buying a gift in 1990. She denies decreased need for sleep, euphoria, talkativeness. She occasionally has creative ideas but reports she has not put those into action before.  Past Medical History:  Past Medical History:  Diagnosis Date  . Anxiety   . Asthma   . Bipolar 2 disorder (HCC)   . Depression   . GERD (gastroesophageal reflux disease)   . Palpitations   . PVC (premature ventricular contraction)     Past Surgical History:  Procedure Laterality Date  . ABDOMINAL HYSTERECTOMY    . APPENDECTOMY      Family Psychiatric History:  Mother- depression, maternal grandmother- schizophrenia, denies suicide attempt  Family History:   Family History  Problem Relation Age of Onset  . Depression Mother   . Multiple sclerosis Mother   . Schizophrenia Maternal Grandmother   . Heart disease Maternal Grandmother     aortic valve replacement    Social History:  Social History   Social History  . Marital status: Divorced    Spouse name: N/A  . Number of children: N/A  . Years of education: N/A   Social History Main Topics  . Smoking status: Former Smoker    Quit date: 03/31/2014  . Smokeless tobacco: Never Used  . Alcohol use No  . Drug use: No  . Sexual activity: Yes    Partners: Male    Birth control/ protection: Surgical   Other Topics Concern  . None   Social History Narrative  . 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  She moved from [place] to [place] in 2011 after divorce to be with her family. She has two girls, age 54 and 55.   Allergies:  Allergies  Allergen Reactions  . Erythromycin Hives  . Tetracyclines & Related Hives    Metabolic Disorder Labs: No results found for: HGBA1C, MPG No results found for: PROLACTIN No results found for: CHOL, TRIG, HDL, CHOLHDL, VLDL, LDLCALC   Current Medications: Current Outpatient Prescriptions  Medication Sig Dispense Refill  . buPROPion (WELLBUTRIN XL) 150 MG 24 hr tablet Take 300 mg daily and 150 mg daily (total of 450 mg)  90 tablet 0  . buPROPion (WELLBUTRIN XL) 300 MG 24 hr tablet Take 300 mg daily and 150 mg daily (total of 450 mg) 90 tablet 0  . FLUoxetine (PROZAC) 40 MG capsule TAKE 2 CAPSULES BY MOUTH DAILY 180 capsule 2  . lansoprazole (PREVACID) 15 MG capsule Take 15 mg by mouth daily at 12 noon.    Marland Kitchen. LORazepam (ATIVAN) 1 MG tablet Take 1 tablet (1 mg total) by mouth daily as needed for anxiety. 30 tablet 0  . Lurasidone HCl 60 MG TABS Take 1 tablet (60 mg total) by mouth daily with breakfast. 90 tablet 0  . meloxicam (MOBIC) 15 MG tablet TAKE 1 TABLET (15 MG TOTAL)  BY MOUTH DAILY. 30 tablet 0  . methocarbamol (ROBAXIN) 750 MG tablet TAKE 1 TABLET (750 MG TOTAL) BY MOUTH 4 (FOUR) TIMES DAILY AS NEEDED. 30 tablet 0  . metoprolol succinate (TOPROL-XL) 50 MG 24 hr tablet TAKE 1 TABLET (50 MG TOTAL) BY MOUTH DAILY. TAKE WITH OR IMMEDIATELY FOLLOWING A MEAL. 90 tablet 1  . vitamin B-12 (CYANOCOBALAMIN) 1000 MCG tablet Take 1,000 mcg by mouth daily.    . Vitamin D, Cholecalciferol, 1000 UNITS TABS Take 5,000 Units by mouth daily.      No current facility-administered medications for this visit.     Neurologic: Headache: No Seizure: No Paresthesias: No  Musculoskeletal: Strength & Muscle Tone: within normal limits Gait & Station: normal Patient leans: N/A  Psychiatric Specialty Exam: Review of Systems  Psychiatric/Behavioral: Negative for depression, hallucinations, substance abuse and suicidal ideas. The patient is nervous/anxious. The patient does not have insomnia.   All other systems reviewed and are negative.   Blood pressure 102/66, pulse 77, height 5\' 4"  (1.626 m), weight 231 lb (104.8 kg).Body mass index is 39.65 kg/m.  General Appearance: Fairly Groomed  Eye Contact:  Good  Speech:  Clear and Coherent  Volume:  Normal  Mood:  "fine"  Affect:  Appropriate, Congruent and Full Range   Thought Process:  Coherent and Goal Directed  Orientation:  Full (Time, Place, and Person)  Thought Content: Logical Perceptions: denies AH/VH  Suicidal Thoughts:  No  Homicidal Thoughts:  No  Memory:  Immediate;   Good Recent;   Good Remote;   Good  Judgement:  Good  Insight:  Fair  Psychomotor Activity:  Normal  Concentration:  Concentration: Good and Attention Span: Good  Recall:  Good  Fund of Knowledge: Good  Language: Good  Akathisia:  No  Handed:  Right  AIMS (if indicated):  N/A  Assets:  Communication Skills Desire for Improvement  ADL's:  Intact  Cognition: WNL  Sleep:  good   Assessment Tracie James is a 46 year old female with  depression, anxiety, who presents for follow up appointment for depression. Psychosocial stressors including divorce in 2011, and discordance with her ex-husband over custody issues.  # MDD  # r/o MDD with mixed episode She has responded very well to uptitrateion of lurasidone, Wellbutrin and she denies significant neurovegetative symptoms except occasional anxiety when she interacts with her ex-husband. Will continue the current medication. Although she will greatly benefit from CBT given ongoing stressor, she would like to hold this at this time. Will continue to discuss. Noted that although she has a history of bipolar disorder per chart, she does not have significant manic/hypomanic episode except irritability and history of spending money once in the past. Her presentation is more consistent with MDD.  # Insomnia There has been significant improvement  after starting lurasidone. Will continue to monitor.   Plan 1.Continue lurasidone 60 mg daily 2 Continue Wellbutrin 450 mg daily 3. Continue fluoxetine 80 mg daily 4. Continue lorazepam 1 mg as needed for anxiety 5. Return to clinic in two months 6. Consider therapy: Dr. Daisy Blossom Schneidmiller  [phone] [address], [place], [place] [address]   The patient demonstrates the following risk factors for suicide: Chronic risk factors for suicide include: psychiatric disorder of depression and previous suicide attempts of overdosing medication. Acute risk factors for suicide include: family or marital conflict. Protective factors for this patient include: responsibility to others (children, family), coping skills and hope for the future. Patient is future oriented and agrees to come for next appointment. Considering these factors, the overall suicide risk at this point appears to be low. Patient is appropriate for outpatient follow up. Emergency resources including crisis call, 911 and ED are discussed.   Treatment Plan Summary:Plan as  above   [name], MD 10/11/2016, 4:48 PM

## 2016-10-11 ENCOUNTER — Encounter (HOSPITAL_COMMUNITY): Payer: Self-pay | Admitting: Psychiatry

## 2016-10-11 ENCOUNTER — Ambulatory Visit (INDEPENDENT_AMBULATORY_CARE_PROVIDER_SITE_OTHER): Payer: Managed Care, Other (non HMO) | Admitting: Psychiatry

## 2016-10-11 VITALS — BP 102/66 | HR 77 | Ht 64.0 in | Wt 231.0 lb

## 2016-10-11 DIAGNOSIS — Z888 Allergy status to other drugs, medicaments and biological substances status: Secondary | ICD-10-CM

## 2016-10-11 DIAGNOSIS — Z87891 Personal history of nicotine dependence: Secondary | ICD-10-CM | POA: Diagnosis not present

## 2016-10-11 DIAGNOSIS — Z818 Family history of other mental and behavioral disorders: Secondary | ICD-10-CM | POA: Diagnosis not present

## 2016-10-11 DIAGNOSIS — F331 Major depressive disorder, recurrent, moderate: Secondary | ICD-10-CM

## 2016-10-11 DIAGNOSIS — Z79899 Other long term (current) drug therapy: Secondary | ICD-10-CM | POA: Diagnosis not present

## 2016-10-11 MED ORDER — LURASIDONE HCL 60 MG PO TABS: 60.0000 mg | ORAL_TABLET | Freq: Every day | ORAL | 0 refills | Status: DC

## 2016-10-11 NOTE — Patient Instructions (Addendum)
1.Continue lurasidone 60 mg daily 2 Continue Wellbutrin 450 mg daily 3. Continue fluoxetine 80 mg daily 4. Continue lorazepam 1 mg as needed for anxiety 5. Return to clinic in two months 6. Consider therapy: Dr. Daisy BlossomSara W Schneidmiller  [phone] [address], [place], [place] [address]

## 2016-10-14 ENCOUNTER — Other Ambulatory Visit: Payer: Self-pay | Admitting: Family Medicine

## 2016-10-20 ENCOUNTER — Ambulatory Visit (INDEPENDENT_AMBULATORY_CARE_PROVIDER_SITE_OTHER): Payer: 59 | Admitting: Unknown Physician Specialty

## 2016-10-20 ENCOUNTER — Encounter: Payer: Self-pay | Admitting: Unknown Physician Specialty

## 2016-10-20 VITALS — BP 110/74 | HR 71 | Temp 98.1°F | Wt 230.6 lb

## 2016-10-20 DIAGNOSIS — M7582 Other shoulder lesions, left shoulder: Secondary | ICD-10-CM

## 2016-10-20 DIAGNOSIS — M25512 Pain in left shoulder: Secondary | ICD-10-CM

## 2016-10-20 MED ORDER — TRIAMCINOLONE ACETONIDE 40 MG/ML IJ SUSP: 40.0000 mg | Freq: Once | INTRAMUSCULAR | Status: DC

## 2016-10-20 MED ORDER — TRIAMCINOLONE ACETONIDE 40 MG/ML IJ SUSP
40.0000 mg | Freq: Once | INTRAMUSCULAR | Status: AC
  Administered 2016-10-20: 40 mg via INTRAMUSCULAR
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
## 2016-10-20 NOTE — Assessment & Plan Note (Addendum)
Recurrent.  Pt felt better following injection.  Refer back to PT for strengthening exercises to prevent relapse.  Meloxicam and Robaxin refilled

## 2016-10-27 ENCOUNTER — Other Ambulatory Visit: Payer: Self-pay | Admitting: Family Medicine

## 2016-11-11 ENCOUNTER — Other Ambulatory Visit: Payer: Self-pay | Admitting: Family Medicine

## 2016-11-11 NOTE — Telephone Encounter (Signed)
Routing to provider  

## 2016-11-16 ENCOUNTER — Ambulatory Visit: Payer: 59 | Attending: Unknown Physician Specialty

## 2016-11-16 DIAGNOSIS — G8929 Other chronic pain: Secondary | ICD-10-CM | POA: Diagnosis present

## 2016-11-16 DIAGNOSIS — M25512 Pain in left shoulder: Secondary | ICD-10-CM | POA: Diagnosis present

## 2016-11-16 DIAGNOSIS — M6281 Muscle weakness (generalized): Secondary | ICD-10-CM | POA: Diagnosis present

## 2016-11-16 NOTE — Patient Instructions (Signed)
Gave self resisted L shoulder ER isometrics 10x3 with 5 second holds daily in a pain free level of effort as part of her HEP. Neutral position. Pt demonstrated and verbalized understanding.

## 2016-11-16 NOTE — Therapy (Signed)
[facility] [address], [place], [address] Phone: [phone]   Fax:  [phone]  Physical Therapy Evaluation  Patient Details  Name: Tracie James MRN: [id number] Date of Birth: 09-Feb-1971 Referring Provider: Gabriel Cirri, NP  Encounter Date: 11/16/2016      PT End of Session - 11/16/16 1620    Visit Number 1   Number of Visits 13   Date for PT Re-Evaluation 12/30/16   PT Start Time 1620   PT Stop Time 1725   PT Time Calculation (min) 65 min   Activity Tolerance Patient tolerated treatment well   Behavior During Therapy [facility] for tasks assessed/performed      Past Medical History:  Diagnosis Date  . Anxiety   . Asthma   . Bipolar 2 disorder (HCC)   . Depression   . GERD (gastroesophageal reflux disease)   . Palpitations   . PVC (premature ventricular contraction)     Past Surgical History:  Procedure Laterality Date  . ABDOMINAL HYSTERECTOMY    . APPENDECTOMY      There were no vitals filed for this visit.       Subjective Assessment - 11/16/16 1625    Subjective L shoulder pain: current 1/10 (pt sitting and resting) and at best; 7/10 at most for the past 2 months (work tasks)   Pertinent History L shoulder pain. Pt states having pain for about 1.5 years. Had PT at [facility]. [address] in [place], [place]. Does not think that it helped. Was sent to an orthopedic doctor, had a cortisone shot (September 2017) which helped. Had a second cortisone shot 10/20/2016 which helped.  PT treatments included the UBE, dry needling, heating pad, TENS unit, T-band exrecises (shoulder extension), isometric L shoulder horizontal abduction and adduction.  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 Pt continuously pulls specimens out of a bucket (abduction and horizontal abduction) and pulls tubes up (L shoulder flexion) and bar codes them which  bothers her shoulder. Aforementioned repetitive movments oveall throughout the day takes about 2-3 hours.   Diagnostic tests Had an x-ray for her L shoulder with was negative for abnormalities. Has not yet had an MRI   Patient Stated Goals Avoid surgery and no more pain.    Currently in Pain? Yes   Pain Score 1    Pain Location Shoulder   Pain Orientation Left   Pain Descriptors / Indicators Aching;Stabbing;Burning  heavy   Pain Type Chronic pain   Pain Onset More than a month ago   Pain Frequency Occasional   Aggravating Factors  repetitive reaching and lifting at work   Pain Relieving Factors rest, time, pain medication            [facility] PT Assessment - 11/16/16 1639      Assessment   Medical Diagnosis L shoulder pain, unspecified chronicity   Referring Provider Gabriel Cirri, NP   Onset Date/Surgical Date 10/20/16  Date PT referral signed. Pain began 1.5 years ago.   Hand Dominance Right   Prior Therapy Pt had prior PT which she felt did not really help. Please see pertinent history section.      Precautions   Precaution Comments No known precautions     Restrictions   Other Position/Activity Restrictions No known restrictions     Balance Screen   Has the patient fallen in the past 6 months No  Has the patient had a decrease in activity level because of a fear of falling?  No   Is the patient reluctant to leave their home because of a fear of falling?  No     Home Environment   Additional Comments Pt lives in a 1 story home with family, 5 steps to enter with bilateral rail.  10 steps inside with bilateral rail      Prior Function   Vocation Full time employment   Vocation Requirements PLOF: less pain with reaching to the side and raising her L arm up for work tasks.    Leisure scrapbook/cards     Observation/Other Assessments   Observations (-) AC joint shear L; (-) empty can test. Increased L shoulder pain with flexion with scapular retraction when trying the  Neer's impingement test; (-) Yocum test but pain with eccentric lowering to get out of position.  No pain with supine ER isometrics with gentle distaction and compression. Slight weakness felt with ER with gentle compression.    Quick DASH  13.63 %     Posture/Postural Control   Posture Comments protracted neck, L greater trochanter and iliac crest slightly higher     AROM   Overall AROM Comments No reproduction of symptoms with cervical AROM all planes and with over pressure.    Right Shoulder Flexion --  full   Right Shoulder ABduction --  full   Right Shoulder Internal Rotation --  functional IR, fingers to L shoulder blade   Right Shoulder External Rotation --  functional ER: fingers to L shoulder blade   Left Shoulder Flexion 137 Degrees  anterior joint pain with eccentric lowering   Left Shoulder ABduction 121 Degrees  with superior joint pain laterally below acromion     PROM   Overall PROM Comments L shoulder flexion in supine: 139 degrees     Strength   Right Shoulder Flexion 5/5   Right Shoulder ABduction 5/5   Right Shoulder Internal Rotation 5/5   Right Shoulder External Rotation 5/5   Left Shoulder Flexion 4+/5   Left Shoulder ABduction 4/5  L inferior acromial pain and superior shoulder pain   Left Shoulder Internal Rotation 4-/5  with suprascapular pain   Left Shoulder External Rotation 4/5     Palpation   Palpation comment TTP L AC joint, inferior to acromion process laterally > anterior; TTP L coracoid process, L infraspinatus muscle       Objectives  Manual therapy  Supine posterior, and posterior inferior glide grade 3- to 3  Decreased AROM with flexion  Seated gentle sustained A to P pressure to L shoulder in neutral  Still difficulty with L shoulder flexion AROM   There-ex  L shoulder ER isometrics 10x5 seconds for 2 sets. No catching with eccentric lowering from flexion   Then with gentle axial compression 10x2 with 5 seconds   Slight  increase in L shoulder flexion. No catching with eccentric lowering    Gave self resisted L shoulder ER isometrics 10x3 with 5 second holds daily in a pain free level of effort as part of her HEP. Neutral position. Pt demonstrated and verbalized understanding.    Improved exercise technique, movement at target joints, use of target muscles after mod verbal, visual, tactile cues.      Decreased catching with eccentric lowering from flexion after exrecises to promote ER muscle use.  PT Education - 11/16/16 1921    Education provided Yes   Education Details ther-ex, HEP, plan of care   Person(s) Educated Patient   Methods Explanation;Tactile cues;Demonstration;Verbal cues   Comprehension Returned demonstration;Verbalized understanding             PT Long Term Goals - 11/16/16 1844      PT LONG TERM GOAL #1   Title Patient will have a decrease in L shoulder pain to 3/10 or less at worst to promote ability to perform reaching at work.    Baseline 7/10 L shoulder pain at worst (11/16/2016)   Time 6   Period Weeks   Status New     PT LONG TERM GOAL #2   Title Patient will improve her L shoulder ER muscle strength by at least 1/2 MMT grade to promote ability to raise her L arm and reach.    Baseline L shoulder ER 4/5 (11/16/2016)   Time 6   Period Weeks   Status New     PT LONG TERM GOAL #3   Title Patient will improve her Quick Dash Disability/Symptom Score by at least 8% as a demonstration of improved function.    Baseline 13.63% (pt recently got a cortisone shot) 11/16/2016   Time 6   Period Weeks   Status New     PT LONG TERM GOAL #4   Title Patient will improve L shoulder abduction AROM to al least 140 degrees and L shoulder flexion to at least 145 degrees to promote ability to raise her L arm, reach.    Baseline L shoulder AROM: 137 degrees flexion, 121 degrees abduction (11/16/2016)   Time 6   Period Weeks   Status New                Plan - 11/16/16 1832    Clinical Impression Statement Patient is a 46 year old female who came to physical therapy secondary to chronic L shoulder pain. She also presents with limited L shoulder ROM, weakness (especially her ER muscles), TTP to her L AC joint, acromion and coracoid processes, and L infaspinatus muscles, decreased posterior and inferior glenohumeral joint mobility, pain with eccentring lowering, and difficulty performing functional tasks such as reaching. Patient will benefit from skilled physical therapy services to address the aforementioned deficits.    Rehab Potential Fair   Clinical Impairments Affecting Rehab Potential chronicity of condition   PT Frequency 2x / week   PT Duration 6 weeks   PT Treatment/Interventions Manual techniques;Neuromuscular re-education;Dry needling;Patient/family education;Therapeutic exercise;Therapeutic activities;Ultrasound;Iontophoresis /ml Dexamethasone;Electrical Stimulation;Aquatic Therapy   PT Next Visit Plan gentle ER muscle strengthening, ROM, modalities PRN   Consulted and Agree with Plan of Care Patient      Patient will benefit from skilled therapeutic intervention in order to improve the following deficits and impairments:  Pain, Improper body mechanics, Impaired UE functional use, Decreased strength, Decreased range of motion  Visit Diagnosis: Chronic left shoulder pain - Plan: PT plan of care cert/re-cert  Muscle weakness (generalized) - Plan: PT plan of care cert/re-cert     Problem List Patient Active Problem List   Diagnosis Date Noted  . Major depressive disorder, recurrent episode, moderate (HCC) 05/28/2016  . Tendonitis of left rotator cuff 01/21/2016  . Obesity 05/27/2015  . Acute anxiety 05/26/2015  . Palpitations 05/26/2015  . Insomnia 05/26/2015  . Vitamin D deficiency 05/26/2015   Loralyn Freshwater PT, DPT   11/16/2016, 7:33 PM  [facility] [address], [place], [address] Phone: [phone]   Fax:  [phone]  Name: Demiana Crumbley MRN: [id number] Date of Birth: 1970-09-29

## 2016-11-22 ENCOUNTER — Ambulatory Visit: Payer: 59

## 2016-11-22 DIAGNOSIS — G8929 Other chronic pain: Secondary | ICD-10-CM

## 2016-11-22 DIAGNOSIS — M6281 Muscle weakness (generalized): Secondary | ICD-10-CM

## 2016-11-22 DIAGNOSIS — M25512 Pain in left shoulder: Principal | ICD-10-CM

## 2016-11-22 NOTE — Therapy (Signed)
[facility] [address] [place], [place], [address] Phone: [phone]   Fax:  [phone]  Physical Therapy Treatment  Patient Details  Name: Tracie James MRN: [id number] Date of Birth: 06/27/1971 Referring Provider: Gabriel Cirriheryl Wicker, NP  Encounter Date: 11/22/2016      PT End of Session - 11/22/16 1616    Visit Number 2   Number of Visits 13   Date for PT Re-Evaluation 12/30/16   PT Start Time 1617   PT Stop Time 1703   PT Time Calculation (min) 46 min   Activity Tolerance Patient tolerated treatment well   Behavior During Therapy [facility] for tasks assessed/performed      Past Medical History:  Diagnosis Date  . Anxiety   . Asthma   . Bipolar 2 disorder (HCC)   . Depression   . GERD (gastroesophageal reflux disease)   . Palpitations   . PVC (premature ventricular contraction)     Past Surgical History:  Procedure Laterality Date  . ABDOMINAL HYSTERECTOMY    . APPENDECTOMY      There were no vitals filed for this visit.      Subjective Assessment - 11/22/16 1618    Subjective L shoulder is better today. No pain currently. Was uncomfortable after last session. The HEP is doing ok at home.  L shoulder at work is much better. Was sore for 2 days then the soreness disappeared.    Pertinent History L shoulder pain. Pt states having pain for about 1.5 years. [facility]. [address] [place], [place]. Does not think that it helped. Was sent to an orthopedic doctor, had a cortisone shot (September 2017) which helped. Had a second cortisone shot 10/20/2016 which helped.  PT treatments included the UBE, dry needling, heating pad, TENS unit, T-band exrecises (shoulder extension), isometric L shoulder horizontal abduction and adduction.  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 Pt continuously pulls specimens out of a bucket (abduction and  horizontal abduction) and pulls tubes up (L shoulder flexion) and bar codes them which bothers her shoulder. Aforementioned repetitive movments oveall throughout the day takes about 2-3 hours.   Diagnostic tests Had an x-ray for her L shoulder with was negative for abnormalities. Has not yet had an MRI   Patient Stated Goals Avoid surgery and no more pain.    Currently in Pain? No/denies   Pain Score 0-No pain   Pain Onset More than a month ago                                 PT Education - 11/22/16 1633    Education provided Yes   Education Details ther-ex, HEP   Person(s) Educated Patient   Methods Explanation;Demonstration;Tactile cues;Verbal cues   Comprehension Returned demonstration;Verbalized understanding        Objectives   There-ex   L shoulder AROM: flexion 133 degrees. More soreness instead of stabbing. Feels better compared to eval.    Abduction 126 degrees. No catching. Soreness.   L shoulder ER isometrics self resistance 10x3 with 5 seconds. Good infraspinatus muscle use palpated.   Pt was recommended to stand while performing work duties at times so her L shoulder does not have to perform too much abduction and flexion ROM to not irritate shoulder. Pt verbalized understanding. Pt states having to  sit due to her feet bothering her from standing too long at work.   L shoulder flexion isometrics 10x5 seconds for 3 sets L shoulder IR isometrics 10x5 seconds for 3 sets   No pain, soreness with L shoulder flexion in standing after isometric exercises.    Supine L serratus reach 5 seconds. Discomfort which eases with rest.   Wall mini push ups 5x2  Seated gentle rhythmic stabilization with L arm slightly forward, elbow at 90 degrees flexion 10 seconds x 10    Improved exercise technique, movement at target joints, use of target muscles after mod verbal, visual, tactile cues.     Manual therapy   STM L upper trap to decrease muscle  knot tension.     133 degrees L shoulder flexion, 120 degrees L shoulder abduction after session. No catching or soreness. Continue working on gentle L shoulder isometrics to promote gentle activation of rotator cuff muscle to promote ability to raise her L arm up with less pain.               PT Long Term Goals - 11/16/16 1844      PT LONG TERM GOAL #1   Title Patient will have a decrease in L shoulder pain to 3/10 or less at worst to promote ability to perform reaching at work.    Baseline 7/10 L shoulder pain at worst (11/16/2016)   Time 6   Period Weeks   Status New     PT LONG TERM GOAL #2   Title Patient will improve her L shoulder ER muscle strength by at least 1/2 MMT grade to promote ability to raise her L arm and reach.    Baseline L shoulder ER 4/5 (11/16/2016)   Time 6   Period Weeks   Status New     PT LONG TERM GOAL #3   Title Patient will improve her Quick Dash Disability/Symptom Score by at least 8% as a demonstration of improved function.    Baseline 13.63% (pt recently got a cortisone shot) 11/16/2016   Time 6   Period Weeks   Status New     PT LONG TERM GOAL #4   Title Patient will improve L shoulder abduction AROM to al least 140 degrees and L shoulder flexion to at least 145 degrees to promote ability to raise her L arm, reach.    Baseline L shoulder AROM: 137 degrees flexion, 121 degrees abduction (11/16/2016)   Time 6   Period Weeks   Status New               Plan - 11/22/16 1616    Clinical Impression Statement 133 degrees L shoulder flexion, 120 degrees L shoulder abduction after session. No catching or soreness. Continue working on gentle L shoulder isometrics to promote gentle activation of rotator cuff muscle to promote ability to raise her L arm up with less pain.    Rehab Potential Fair   Clinical Impairments Affecting Rehab Potential chronicity of condition   PT Frequency 2x / week   PT Duration 6 weeks   PT Treatment/Interventions  Manual techniques;Neuromuscular re-education;Dry needling;Patient/family education;Therapeutic exercise;Therapeutic activities;Ultrasound;Iontophoresis 4mg /ml Dexamethasone;Electrical Stimulation;Aquatic Therapy   PT Next Visit Plan gentle ER muscle strengthening, ROM, modalities PRN   Consulted and Agree with Plan of Care Patient      Patient will benefit from skilled therapeutic intervention in order to improve the following deficits and impairments:  Pain, Improper body mechanics, Impaired UE functional use, Decreased strength, Decreased  range of motion  Visit Diagnosis: Chronic left shoulder pain  Muscle weakness (generalized)     Problem List Patient Active Problem List   Diagnosis Date Noted  . Major depressive disorder, recurrent episode, moderate (HCC) 05/28/2016  . Tendonitis of left rotator cuff 01/21/2016  . Obesity 05/27/2015  . Acute anxiety 05/26/2015  . Palpitations 05/26/2015  . Insomnia 05/26/2015  . Vitamin D deficiency 05/26/2015    Loralyn Freshwater PT, DPT   11/22/2016, 6:25 PM  [facility] SPORTS MEDICINE [address] [place], [address] Phone: [phone]   Fax:  [phone]  Name: Tracie James MRN: [id number] Date of Birth: [birth year]

## 2016-11-22 NOTE — Patient Instructions (Addendum)
  Gave L shoulder IR isometrics and flexion isometrics (both with elbows flexed to 90 degrees) 10x3 with 5 seconds each  daily as part of her HEP. Pt demonstrated and verbalized understanding.

## 2016-11-23 ENCOUNTER — Other Ambulatory Visit: Payer: Self-pay | Admitting: Family Medicine

## 2016-11-24 NOTE — Telephone Encounter (Signed)
Routing to provider  

## 2016-11-25 ENCOUNTER — Ambulatory Visit: Payer: 59

## 2016-11-25 DIAGNOSIS — M25512 Pain in left shoulder: Secondary | ICD-10-CM | POA: Diagnosis not present

## 2016-11-25 DIAGNOSIS — G8929 Other chronic pain: Secondary | ICD-10-CM

## 2016-11-25 DIAGNOSIS — M6281 Muscle weakness (generalized): Secondary | ICD-10-CM

## 2016-11-25 NOTE — Therapy (Signed)
[facility] [address] [place], [address] Phone: [phone]   Fax:  [phone]  Physical Therapy Treatment  Patient Details  Name: Tracie James MRN: [id number] Date of Birth: Dec 09, 1970 Referring Provider: Gabriel Cirri, NP  Encounter Date: 11/25/2016      PT End of Session - 11/25/16 1631    Visit Number 3   Number of Visits 13   Date for PT Re-Evaluation 12/30/16   PT Start Time 1631   PT Stop Time 1719   PT Time Calculation (min) 48 min   Activity Tolerance Patient tolerated treatment well   Behavior During Therapy [facility] for tasks assessed/performed      Past Medical History:  Diagnosis Date  . Anxiety   . Asthma   . Bipolar 2 disorder (HCC)   . Depression   . GERD (gastroesophageal reflux disease)   . Palpitations   . PVC (premature ventricular contraction)     Past Surgical History:  Procedure Laterality Date  . ABDOMINAL HYSTERECTOMY    . APPENDECTOMY      There were no vitals filed for this visit.      Subjective Assessment - 11/25/16 1632    Subjective L shoulder was fine until this morning when she picked up a waste contatiner at work the wrong way (thumb down).  5/10 currently. Did not have any pain since after last session until this morning.  Has not yet been able to do her HEP today.    Pertinent History L shoulder pain. Pt states having pain for about 1.5 years. Had PT at [facility]. [address] in [place], [place]. Does not think that it helped. Was sent to an orthopedic doctor, had a cortisone shot (September 2017) which helped. Had a second cortisone shot 10/20/2016 which helped.  PT treatments included the UBE, dry needling, heating pad, TENS unit, T-band exrecises (shoulder extension), isometric L shoulder horizontal abduction and adduction.  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 Pt continuously pulls  specimens out of a bucket (abduction and horizontal abduction) and pulls tubes up (L shoulder flexion) and bar codes them which bothers her shoulder. Aforementioned repetitive movments oveall throughout the day takes about 2-3 hours.   Diagnostic tests Had an x-ray for her L shoulder with was negative for abnormalities. Has not yet had an MRI   Patient Stated Goals Avoid surgery and no more pain.    Currently in Pain? Yes   Pain Score 5    Pain Onset More than a month ago                                 PT Education - 11/25/16 1637    Education provided Yes   Education Details ther-ex   [employer]) Educated Patient   Methods Explanation;Demonstration;Tactile cues;Verbal cues   Comprehension Returned demonstration;Verbalized understanding        Objectives   There-ex  L shoulder ER isometrics 10x5 seconds for 2 sets  Bent over pendulums flexion/extension 30 seconds for pain control   Standing pendulums flexion/extension 30 seconds for pain control   Gentle L shoulder towel/pillow case IR stretch 4x. Aches.   Standing L shoulder IR isometrics at wall 10x5 seconds for 2 sets   Standing L shoulder IR resisting yellow band 10x3  L arm propped in about 60 degrees scaption:  gentle manually resisted L shoulder ER 10x3       Gentle manually resisted L shoulder IR 10x3    Improved exercise technique, movement at target joints, use of target muscles after min to mod verbal, visual, tactile cues.      Manual therapy   A to P to L shoulder grade 2 for pain control. Decreased pain to 3-4/10 afterwards STM L upper trap. No change in pain   Pt was recommended to use ice to her L shoulder at home for pain control. Pt verbalized understanding.      Pt maintains her improved ability to perform L shoulder flexion wiithout catching sensation when lowering it since last session. Decreased pain with grade 2 A to P mob to L shoulder. Continued working on  gentle strengthening to promote ability to raise her L arm.          PT Long Term Goals - 11/16/16 1844      PT LONG TERM GOAL #1   Title Patient will have a decrease in L shoulder pain to 3/10 or less at worst to promote ability to perform reaching at work.    Baseline 7/10 L shoulder pain at worst (11/16/2016)   Time 6   Period Weeks   Status New     PT LONG TERM GOAL #2   Title Patient will improve her L shoulder ER muscle strength by at least 1/2 MMT grade to promote ability to raise her L arm and reach.    Baseline L shoulder ER 4/5 (11/16/2016)   Time 6   Period Weeks   Status New     PT LONG TERM GOAL #3   Title Patient will improve her Quick Dash Disability/Symptom Score by at least 8% as a demonstration of improved function.    Baseline 13.63% (pt recently got a cortisone shot) 11/16/2016   Time 6   Period Weeks   Status New     PT LONG TERM GOAL #4   Title Patient will improve L shoulder abduction AROM to al least 140 degrees and L shoulder flexion to at least 145 degrees to promote ability to raise her L arm, reach.    Baseline L shoulder AROM: 137 degrees flexion, 121 degrees abduction (11/16/2016)   Time 6   Period Weeks   Status New               Plan - 11/25/16 1631    Clinical Impression Statement Pt maintains her improved ability to perform L shoulder flexion wiithout catching sensation when lowering it since last session. Decreased pain with grade 2 A to P mob to L shoulder. Continued working on gentle strengthening to promote ability to raise her L arm.    Rehab Potential Fair   Clinical Impairments Affecting Rehab Potential chronicity of condition   PT Frequency 2x / week   PT Duration 6 weeks   PT Treatment/Interventions Manual techniques;Neuromuscular re-education;Dry needling;Patient/family education;Therapeutic exercise;Therapeutic activities;Ultrasound;Iontophoresis 4mg /ml Dexamethasone;Electrical Stimulation;Aquatic Therapy   PT Next Visit  Plan gentle ER muscle strengthening, ROM, modalities PRN   Consulted and Agree with Plan of Care Patient      Patient will benefit from skilled therapeutic intervention in order to improve the following deficits and impairments:  Pain, Improper body mechanics, Impaired UE functional use, Decreased strength, Decreased range of motion  Visit Diagnosis: Chronic left shoulder pain  Muscle weakness (generalized)     Problem List Patient Active Problem List   Diagnosis Date Noted  .  Major depressive disorder, recurrent episode, moderate (HCC) 05/28/2016  . Tendonitis of left rotator cuff 01/21/2016  . Obesity 05/27/2015  . Acute anxiety 05/26/2015  . Palpitations 05/26/2015  . Insomnia 05/26/2015  . Vitamin D deficiency 05/26/2015    Loralyn FreshwaterMiguel Omarrion Carmer PT, DPT   11/25/2016, 6:31 PM  [facility] [address] [place], [place], [address] Phone: [phone]   Fax:  [phone]  Name: Silvio Claymanimee Maciborski MRN: [id number] Date of Birth: 04/28/1971

## 2016-11-30 ENCOUNTER — Ambulatory Visit: Payer: 59

## 2016-11-30 DIAGNOSIS — G8929 Other chronic pain: Secondary | ICD-10-CM

## 2016-11-30 DIAGNOSIS — M25512 Pain in left shoulder: Secondary | ICD-10-CM | POA: Diagnosis not present

## 2016-11-30 DIAGNOSIS — M6281 Muscle weakness (generalized): Secondary | ICD-10-CM

## 2016-11-30 NOTE — Therapy (Signed)
[facility] [address] [place], [address] Phone: [phone]   Fax:  [phone]  Physical Therapy Treatment  Patient Details  Name: Tracie James MRN: [id number] Date of Birth: 02/01/1971 Referring Provider: Gabriel Cirri, NP  Encounter Date: 11/30/2016      PT End of Session - 11/30/16 1602    Visit Number 4   Number of Visits 13   Date for PT Re-Evaluation 12/30/16   PT Start Time 1602   PT Stop Time 1644   PT Time Calculation (min) 42 min   Activity Tolerance Patient tolerated treatment well   Behavior During Therapy [facility] for tasks assessed/performed      Past Medical History:  Diagnosis Date  . Anxiety   . Asthma   . Bipolar 2 disorder (HCC)   . Depression   . GERD (gastroesophageal reflux disease)   . Palpitations   . PVC (premature ventricular contraction)     Past Surgical History:  Procedure Laterality Date  . ABDOMINAL HYSTERECTOMY    . APPENDECTOMY      There were no vitals filed for this visit.      Subjective Assessment - 11/30/16 1603    Subjective Has not been raising her L arm with her thumb down. Does not hurt currently.    Pertinent History L shoulder pain. Pt states having pain for about 1.5 years. Had PT at [facility]. [address] in [place], [place]. Does not think that it helped. Was sent to an orthopedic doctor, had a cortisone shot (September 2017) which helped. Had a second cortisone shot 10/20/2016 which helped.  PT treatments included the UBE, dry needling, heating pad, TENS unit, T-band exrecises (shoulder extension), isometric L shoulder horizontal abduction and adduction.  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 Pt continuously pulls specimens out of a bucket (abduction and horizontal abduction) and pulls tubes up (L shoulder flexion) and bar codes them which bothers her shoulder. Aforementioned  repetitive movments oveall throughout the day takes about 2-3 hours.   Diagnostic tests Had an x-ray for her L shoulder with was negative for abnormalities. Has not yet had an MRI   Patient Stated Goals Avoid surgery and no more pain.    Currently in Pain? No/denies   Pain Score 0-No pain   Pain Onset More than a month ago                                 PT Education - 11/30/16 1611    Education provided Yes   Education Details ther-ex   [employer]) Educated Patient   Methods Explanation;Demonstration;Tactile cues;Verbal cues   Comprehension Verbalized understanding;Returned demonstration         Objectives  Full range abduction and flexion L shoulder, no pain.    There-ex  L shoulder ER resisting yellow band 8x L shoulder IR resisting red band 10x2 Red T-band bilateral scapular retraction 10x3 Wall push-ups 10x2 Wall pectoralis stretch 30 seconds x 3 for L UE  Prone scapular retraction with scaption 10x     With horizontal abduction 5x2  Supine rhythmic stabilization L shoulder in 90 degrees scaption 10x 5 seconds       Then in end range but shy of pain end range scaption 10x 5 seconds         Discomfort during the return  motion, PT assist needed to return.   Standing bilateral shoulder extension resisting yellow band 10x3 to promote scapular and posterior shoulder strengthening.     Improved exercise technique, movement at target joints, use of target muscles after mod verbal, visual, tactile cues.    Pt able to perform flexion and abduction AROM against gravity in standing today without pain. Symptoms with end range scaption in supine. Continued working on rotator cuff, posterior shoulder and scapular strengthening as tolerated, stabilization, and scapular control.         PT Long Term Goals - 11/16/16 1844      PT LONG TERM GOAL #1   Title Patient will have a decrease in L shoulder pain to 3/10 or less at worst to promote ability  to perform reaching at work.    Baseline 7/10 L shoulder pain at worst (11/16/2016)   Time 6   Period Weeks   Status New     PT LONG TERM GOAL #2   Title Patient will improve her L shoulder ER muscle strength by at least 1/2 MMT grade to promote ability to raise her L arm and reach.    Baseline L shoulder ER 4/5 (11/16/2016)   Time 6   Period Weeks   Status New     PT LONG TERM GOAL #3   Title Patient will improve her Quick Dash Disability/Symptom Score by at least 8% as a demonstration of improved function.    Baseline 13.63% (pt recently got a cortisone shot) 11/16/2016   Time 6   Period Weeks   Status New     PT LONG TERM GOAL #4   Title Patient will improve L shoulder abduction AROM to al least 140 degrees and L shoulder flexion to at least 145 degrees to promote ability to raise her L arm, reach.    Baseline L shoulder AROM: 137 degrees flexion, 121 degrees abduction (11/16/2016)   Time 6   Period Weeks   Status New               Plan - 11/30/16 1556    Clinical Impression Statement Pt able to perform flexion and abduction AROM against gravity in standing today without pain. Symptoms with end range scaption in supine. Continued working on rotator cuff, posterior shoulder and scapular strengthening as tolerated, stabilization, and scapular control.   Rehab Potential Fair   Clinical Impairments Affecting Rehab Potential chronicity of condition   PT Frequency 2x / week   PT Duration 6 weeks   PT Treatment/Interventions Manual techniques;Neuromuscular re-education;Dry needling;Patient/family education;Therapeutic exercise;Therapeutic activities;Ultrasound;Iontophoresis 4mg /ml Dexamethasone;Electrical Stimulation;Aquatic Therapy   PT Next Visit Plan gentle ER muscle strengthening, ROM, modalities PRN   Consulted and Agree with Plan of Care Patient      Patient will benefit from skilled therapeutic intervention in order to improve the following deficits and impairments:  Pain,  Improper body mechanics, Impaired UE functional use, Decreased strength, Decreased range of motion  Visit Diagnosis: Chronic left shoulder pain  Muscle weakness (generalized)     Problem List Patient Active Problem List   Diagnosis Date Noted  . Major depressive disorder, recurrent episode, moderate (HCC) 05/28/2016  . Tendonitis of left rotator cuff 01/21/2016  . Obesity 05/27/2015  . Acute anxiety 05/26/2015  . Palpitations 05/26/2015  . Insomnia 05/26/2015  . Vitamin D deficiency 05/26/2015    Loralyn FreshwaterMiguel Laygo PT, DPT  11/30/2016, 4:49 PM  [facility] [address] [place], [place],  [address] Phone: [phone]   Fax:  [phone]  Name: Tracie James MRN: [id number] Date of Birth: September 20, 1970

## 2016-12-02 ENCOUNTER — Ambulatory Visit: Payer: 59

## 2016-12-02 DIAGNOSIS — M6281 Muscle weakness (generalized): Secondary | ICD-10-CM

## 2016-12-02 DIAGNOSIS — M25512 Pain in left shoulder: Principal | ICD-10-CM

## 2016-12-02 DIAGNOSIS — G8929 Other chronic pain: Secondary | ICD-10-CM

## 2016-12-02 NOTE — Therapy (Signed)
[facility] [address], [place], [address] Phone: [phone]   Fax:  [phone]  Physical Therapy Treatment  Patient Details  Name: Tracie James MRN: [id number] Date of Birth: [birth year] Referring Provider: Gabriel Cirri, NP  Encounter Date: 12/02/2016      PT End of Session - 12/02/16 1625    Visit Number 5   Number of Visits 13   Date for PT Re-Evaluation 12/30/16   PT Start Time 1625  pt arrived late   PT Stop Time 1654   PT Time Calculation (min) 29 min   Activity Tolerance Patient tolerated treatment well   Behavior During Therapy [facility] for tasks assessed/performed      Past Medical History:  Diagnosis Date  . Anxiety   . Asthma   . Bipolar 2 disorder (HCC)   . Depression   . GERD (gastroesophageal reflux disease)   . Palpitations   . PVC (premature ventricular contraction)     Past Surgical History:  Procedure Laterality Date  . ABDOMINAL HYSTERECTOMY    . APPENDECTOMY      There were no vitals filed for this visit.      Subjective Assessment - 12/02/16 1627    Subjective L shoulder is fine. Feels discomfort L triceps when raising her arm up. Feels like she worked out that muscle too much. Started yesterday morning. Feels a dull ache like you've been to the gym.    Pertinent History L shoulder pain. Pt states having pain for about 1.5 years. Had PT at [facility]. [address] in [place], [place]. Does not think that it helped. Was sent to an orthopedic doctor, had a cortisone shot (September 2017) which helped. Had a second cortisone shot 10/20/2016 which helped.  PT treatments included the UBE, dry needling, heating pad, TENS unit, T-band exrecises (shoulder extension), isometric L shoulder horizontal abduction and adduction.  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 Pt continuously pulls specimens out of a bucket  (abduction and horizontal abduction) and pulls tubes up (L shoulder flexion) and bar codes them which bothers her shoulder. Aforementioned repetitive movments oveall throughout the day takes about 2-3 hours.   Diagnostic tests Had an x-ray for her L shoulder with was negative for abnormalities. Has not yet had an MRI   Patient Stated Goals Avoid surgery and no more pain.    Currently in Pain? Yes   Pain Score --  no pain level provided.    Pain Onset More than a month ago                                 PT Education - 12/02/16 1630    Education provided Yes   Education Details ther-ex   [employer]) Educated Patient   Methods Explanation;Demonstration;Tactile cues;Verbal cues   Comprehension Returned demonstration;Verbalized understanding        Objectives  Full range abduction and flexion L shoulder, no pain at shoulder. Feels triceps discomfort when she raises her arm. Eases with rest.      There-ex  L shoulder ER resisting yellow band 10x L biceps flexion resisting 2 lbs 8x2  L shoulder IR resisting red band 10x2  Red T-band bilateral scapular retraction 10x3  Wall pectoralis stretch 45 seconds x 2 for L UE  Prone scapular retraction with scaption 10x  With horizontal abduction 5x2  Supine rhythmic stabilizatoin L shoulder at 90 degrees flexion 30 seconds x 3   Supine L shoulder flexion with gentle manual resistance from PT 10x    Improved exercise technique, movement at target joints, use of target muscles after min to mod verbal, visual, tactile cues.   No pain (shoulder or tripceps) with L shoulder flexion and abduction throughout range after session. Continue working on infraspinatus, scapular strengthening, stability, and scapular control as tolerated.         PT Long Term Goals - 11/16/16 1844      PT LONG TERM GOAL #1   Title Patient will have a decrease in L shoulder  pain to 3/10 or less at worst to promote ability to perform reaching at work.    Baseline 7/10 L shoulder pain at worst (11/16/2016)   Time 6   Period Weeks   Status New     PT LONG TERM GOAL #2   Title Patient will improve her L shoulder ER muscle strength by at least 1/2 MMT grade to promote ability to raise her L arm and reach.    Baseline L shoulder ER 4/5 (11/16/2016)   Time 6   Period Weeks   Status New     PT LONG TERM GOAL #3   Title Patient will improve her Quick Dash Disability/Symptom Score by at least 8% as a demonstration of improved function.    Baseline 13.63% (pt recently got a cortisone shot) 11/16/2016   Time 6   Period Weeks   Status New     PT LONG TERM GOAL #4   Title Patient will improve L shoulder abduction AROM to al least 140 degrees and L shoulder flexion to at least 145 degrees to promote ability to raise her L arm, reach.    Baseline L shoulder AROM: 137 degrees flexion, 121 degrees abduction (11/16/2016)   Time 6   Period Weeks   Status New               Plan - 12/02/16 1632    Clinical Impression Statement No pain (shoulder or tripceps) with L shoulder flexion and abduction throughout range after session. Continue working on infraspinatus, scapular strengthening, stability, and scapular control as tolerated.    Rehab Potential Fair   Clinical Impairments Affecting Rehab Potential chronicity of condition   PT Frequency 2x / week   PT Duration 6 weeks   PT Treatment/Interventions Manual techniques;Neuromuscular re-education;Dry needling;Patient/family education;Therapeutic exercise;Therapeutic activities;Ultrasound;Iontophoresis 4mg /ml Dexamethasone;Electrical Stimulation;Aquatic Therapy   PT Next Visit Plan gentle ER muscle strengthening, ROM, modalities PRN   Consulted and Agree with Plan of Care Patient      Patient will benefit from skilled therapeutic intervention in order to improve the following deficits and impairments:  Pain, Improper body  mechanics, Impaired UE functional use, Decreased strength, Decreased range of motion  Visit Diagnosis: Chronic left shoulder pain  Muscle weakness (generalized)     Problem List Patient Active Problem List   Diagnosis Date Noted  . Major depressive disorder, recurrent episode, moderate (HCC) 05/28/2016  . Tendonitis of left rotator cuff 01/21/2016  . Obesity 05/27/2015  . Acute anxiety 05/26/2015  . Palpitations 05/26/2015  . Insomnia 05/26/2015  . Vitamin D deficiency 05/26/2015   Loralyn FreshwaterMiguel Heliodoro Domagalski PT, DPT   12/02/2016, 6:04 PM  [facility] [address] [place], [place], [address] Phone: [phone]   Fax:  [phone]  Name: Silvio Claymanimee Maciborski MRN:  [id number] Date of Birth: [birth year]

## 2016-12-06 NOTE — Progress Notes (Signed)
BH MD/PA/NP OP Progress Note  12/09/2016 4:32 PM Tracie James  MRN:  [id number]  Chief Complaint:  Chief Complaint    Follow-up; Depression     Subjective:  "I'm tired" HPI:  Patient comes in for follow-up appointment. She reports that she feels fatigue and has no motivation. Although she used to enjoy scrap books, she does not do it any more. She has been able to go to work and takes care of her children at home. She had a good day on mother's day. She goes to work at 5 am for the past six weeks. She does not feel depressed and sleeps well at night. She states that her family thinks that she is "happier" these days. She enjoys watching TV at home. She has taken ativan only for a few times and denies significant anxiety. She denies SI. She denies decreased need for sleep, euphoria or increased impulsive behavior.   Wt Readings from Last 3 Encounters:  12/09/16 225 lb 3.2 oz (102.2 kg)  10/20/16 230 lb 9.6 oz (104.6 kg)  10/11/16 231 lb (104.8 kg)    Visit Diagnosis:    ICD-9-CM ICD-10-CM   1. Major depressive disorder, recurrent episode, moderate (HCC) 296.32 F33.1      Past Psychiatric History:  Outpatient: Dx bipolar II disorder since 46 yo Psychiatry admission: SI in [place] for 3 days in 2010 (does not remember details) Previous suicide attempt: overdoing pills last in 7 years  (did not seek medical care), denies SIB Past trials of medication: Cymbalta, Zoloft, Prozac, Lithium (sick), Depakote (spending money), Wellbutrin, Latuda, Abilify (did not work), Seroquel (weight gain, somnolence) Note:  She reports history of spending money worth $1,000 buying a gift in 1990. She denies decreased need for sleep, euphoria, talkativeness. She occasionally has creative ideas but reports she has not put those into action before.  Past Medical History:  Past Medical History:  Diagnosis Date  . Anxiety   . Asthma   . Bipolar 2 disorder (HCC)   . Depression   . GERD (gastroesophageal  reflux disease)   . Palpitations   . PVC (premature ventricular contraction)     Past Surgical History:  Procedure Laterality Date  . ABDOMINAL HYSTERECTOMY    . APPENDECTOMY      Family Psychiatric History:  Mother- depression, maternal grandmother- schizophrenia, denies suicide attempt  Family History:  Family History  Problem Relation Age of Onset  . Depression Mother   . Multiple sclerosis Mother   . Schizophrenia Maternal Grandmother   . Heart disease Maternal Grandmother        aortic valve replacement    Social History:  Social History   Social History  . Marital status: Divorced    Spouse name: N/A  . Number of children: N/A  . Years of education: N/A   Social History Main Topics  . Smoking status: Former Smoker    Quit date: 03/31/2014  . Smokeless tobacco: Never Used  . Alcohol use No  . Drug use: No  . Sexual activity: Yes    Partners: Male    Birth control/ protection: Surgical   Other Topics Concern  . None   Social History Narrative  . 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  She moved from [place] to [place] in 2011 after divorce to be with her family. She has two girls, age 47 and 25.   Allergies:  Allergies  Allergen Reactions  . Erythromycin Hives  . Tetracyclines & Related Hives    Metabolic Disorder Labs: No results found for: HGBA1C, MPG No results found for: PROLACTIN No results found for: CHOL, TRIG, HDL, CHOLHDL, VLDL, LDLCALC   Current Medications: Current Outpatient Prescriptions  Medication Sig Dispense Refill  . buPROPion (WELLBUTRIN XL) 150 MG 24 hr tablet Take 300 mg daily and 150 mg daily (total of 450 mg) 90 tablet 0  . buPROPion (WELLBUTRIN XL) 300 MG 24 hr tablet Take 300 mg daily and 150 mg daily (total of 450 mg) 90 tablet 0  . FLUoxetine (PROZAC) 40 MG capsule Take 2 capsules (80 mg total) by mouth daily. 180 capsule 0  . lansoprazole  (PREVACID) 15 MG capsule Take 15 mg by mouth daily at 12 noon.    Marland Kitchen. LORazepam (ATIVAN) 1 MG tablet Take 1 tablet (1 mg total) by mouth daily as needed for anxiety. 30 tablet 0  . Lurasidone HCl 60 MG TABS Take 1 tablet (60 mg total) by mouth daily with breakfast. 90 tablet 0  . meloxicam (MOBIC) 15 MG tablet TAKE 1 TABLET (15 MG TOTAL) BY MOUTH DAILY. 30 tablet 0  . methocarbamol (ROBAXIN) 750 MG tablet TAKE 1 TABLET (750 MG TOTAL) BY MOUTH 4 (FOUR) TIMES DAILY AS NEEDED. 30 tablet 0  . metoprolol succinate (TOPROL-XL) 50 MG 24 hr tablet TAKE 1 TABLET (50 MG TOTAL) BY MOUTH DAILY. TAKE WITH OR IMMEDIATELY FOLLOWING A MEAL. 90 tablet 1  . vitamin B-12 (CYANOCOBALAMIN) 1000 MCG tablet Take 1,000 mcg by mouth daily.    . Vitamin D, Cholecalciferol, 1000 UNITS TABS Take 5,000 Units by mouth daily.      No current facility-administered medications for this visit.     Neurologic: Headache: No Seizure: No Paresthesias: No  Musculoskeletal: Strength & Muscle Tone: within normal limits Gait & Station: normal Patient leans: N/A  Psychiatric Specialty Exam: Review of Systems  Psychiatric/Behavioral: Negative for depression, hallucinations, substance abuse and suicidal ideas. The patient is not nervous/anxious and does not have insomnia.   All other systems reviewed and are negative.   Blood pressure 113/76, pulse 80, weight 225 lb 3.2 oz (102.2 kg).Body mass index is 38.66 kg/m.  General Appearance: Fairly Groomed  Eye Contact:  Good  Speech:  Clear and Coherent  Volume:  Normal  Mood:  "fine"  Affect:  Appropriate, Congruent and Full Range, smiles at times  Thought Process:  Coherent and Goal Directed  Orientation:  Full (Time, Place, and Person)  Thought Content: Logical Perceptions: denies AH/VH  Suicidal Thoughts:  No  Homicidal Thoughts:  No  Memory:  Immediate;   Good Recent;   Good Remote;   Good  Judgement:  Good  Insight:  Fair  Psychomotor Activity:  Normal   Concentration:  Concentration: Good and Attention Span: Good  Recall:  Good  Fund of Knowledge: Good  Language: Good  Akathisia:  No  Handed:  Right  AIMS (if indicated):  N/A  Assets:  Communication Skills Desire for Improvement  ADL's:  Intact  Cognition: WNL  Sleep:  good   Assessment Tracie James is a 46 year old female with depression, anxiety, who presents for follow up appointment for depression. Psychosocial stressors including divorce in 2011, and discordance with her ex-husband over custody issues.  # MDD, recurrent without psychotic features She endorses fatigue with very low motivation while she denies feeling depressed and her affect has been improved. Will continue fluoxetine, Wellbutrin for depression  and lurasidone as adjunctive treatment. Lurasidone may be uptitrated in the future given her good response, if she develops worsening in her other mood symptoms. She is interested in seeing a therapist and referral information is provided. She will greatly benefit from CBT and/or supportive therapy to cope with stress over her ex-husband.   Plan 1.Continue lurasidone 60 mg daily 2 Continue Wellbutrin 450 mg daily 3. Continue fluoxetine 80 mg daily 4. Continue lorazepam 1 mg as needed for anxiety 5. Return to clinic in two months 6. Consider therapy: Dr. Daisy Blossom Schneidmiller  [phone] [address] [place], [place] [address]   The patient demonstrates the following risk factors for suicide: Chronic risk factors for suicide include: psychiatric disorder of depression and previous suicide attempts of overdosing medication. Acute risk factors for suicide include: family or marital conflict. Protective factors for this patient include: responsibility to others (children, family), coping skills and hope for the future. Patient is future oriented and agrees to come for next appointment. Considering these factors, the overall suicide risk at this point appears to be  low. Patient is appropriate for outpatient follow up. Emergency resources including crisis call, 911 and ED are discussed.   Treatment Plan Summary:Plan as above   [name], MD 12/09/2016, 4:32 PM

## 2016-12-07 ENCOUNTER — Ambulatory Visit: Payer: 59

## 2016-12-07 DIAGNOSIS — G8929 Other chronic pain: Secondary | ICD-10-CM

## 2016-12-07 DIAGNOSIS — M25512 Pain in left shoulder: Secondary | ICD-10-CM | POA: Diagnosis not present

## 2016-12-07 DIAGNOSIS — M6281 Muscle weakness (generalized): Secondary | ICD-10-CM

## 2016-12-07 NOTE — Therapy (Signed)
[facility] [address] [place], [address] Phone: [phone]   Fax:  [phone]  Physical Therapy Treatment  Patient Details  Name: Tracie James MRN: [id number] Date of Birth: July 28, 1970 Referring Provider: Gabriel Cirri, NP  Encounter Date: 12/07/2016      PT End of Session - 12/07/16 1615    Visit Number 6   Number of Visits 13   Date for PT Re-Evaluation 12/30/16   PT Start Time 1615   PT Stop Time 1700   PT Time Calculation (min) 45 min   Activity Tolerance Patient tolerated treatment well   Behavior During Therapy [facility] for tasks assessed/performed      Past Medical History:  Diagnosis Date  . Anxiety   . Asthma   . Bipolar 2 disorder (HCC)   . Depression   . GERD (gastroesophageal reflux disease)   . Palpitations   . PVC (premature ventricular contraction)     Past Surgical History:  Procedure Laterality Date  . ABDOMINAL HYSTERECTOMY    . APPENDECTOMY      There were no vitals filed for this visit.      Subjective Assessment - 12/07/16 1616    Subjective L shoulder is alright. No pain right this second. Holding the baby does not help (12 lbs), 10/10 at that time on Sunday.    Pertinent History L shoulder pain. Pt states having pain for about 1.5 years. Had PT at [facility] at [facility]. [address] in [place], [place]. Does not think that it helped. Was sent to an orthopedic doctor, had a cortisone shot (September 2017) which helped. Had a second cortisone shot 10/20/2016 which helped.  PT treatments included the UBE, dry needling, heating pad, TENS unit, T-band exrecises (shoulder extension), isometric L shoulder horizontal abduction and adduction.  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 Pt continuously pulls specimens out of a bucket (abduction and horizontal abduction) and pulls tubes up (L shoulder flexion) and bar codes them  which bothers her shoulder. Aforementioned repetitive movments oveall throughout the day takes about 2-3 hours.   Diagnostic tests Had an x-ray for her L shoulder with was negative for abnormalities. Has not yet had an MRI   Patient Stated Goals Avoid surgery and no more pain.    Currently in Pain? Yes   Pain Score 3   3/10 when returning from end range flexion   Pain Onset More than a month ago                                 PT Education - 12/07/16 1626    Education provided Yes   Education Details ther-ex   [employer]) Educated Patient   Methods Explanation;Demonstration;Tactile cues;Verbal cues   Comprehension Returned demonstration;Verbalized understanding        Objectives  Full range abduction and flexion L shoulder. L shoulder pain when returning from flexion     There-ex  Pt was recommended to hold off on positions which place her L arm in abduction and IR which usually irritates her shoulder. Pt verbalized understanding.   L shoulder ER resisting yellow band 10x3 at pain free range  L shoulder IR resisting red band 10x  Then resisting green band 10x2  No pain with L shoulder flexion AROM and with return motion after performing aforementioned exercises   Red T-band  bilateral scapular retraction 10x3 for 5 seconds   L biceps flexion resisting 2 lbs 10x  Then 4 lbs 10x  L arm propped in about 80 degrees scaption:   Gentle manually resisted L shoulder ER 10x2  Then with 1 lb weight 10x   Gentle manually resisted IR 10x3  Wall pectoralis stretch 45 seconds x 2 for L UE  Prone scapular retraction with scaption 10x With horizontal abduction 5x2   Improved exercise technique, movement at target joints, use of target muscles after min to mod verbal, visual, tactile cues.     Increased L shoulder pain during the weekend when pt carried her niece (12 lb) Sunday with her arm in  abduction and IR. Decreased pain with exercises strengthening her ER, IR and scapular muscles. Worked on infraspinatus muscle strengthening with arm in scaption to help decrease pain when carrying her 12 lb niece.         PT Long Term Goals - 11/16/16 1844      PT LONG TERM GOAL #1   Title Patient will have a decrease in L shoulder pain to 3/10 or less at worst to promote ability to perform reaching at work.    Baseline 7/10 L shoulder pain at worst (11/16/2016)   Time 6   Period Weeks   Status New     PT LONG TERM GOAL #2   Title Patient will improve her L shoulder ER muscle strength by at least 1/2 MMT grade to promote ability to raise her L arm and reach.    Baseline L shoulder ER 4/5 (11/16/2016)   Time 6   Period Weeks   Status New     PT LONG TERM GOAL #3   Title Patient will improve her Quick Dash Disability/Symptom Score by at least 8% as a demonstration of improved function.    Baseline 13.63% (pt recently got a cortisone shot) 11/16/2016   Time 6   Period Weeks   Status New     PT LONG TERM GOAL #4   Title Patient will improve L shoulder abduction AROM to al least 140 degrees and L shoulder flexion to at least 145 degrees to promote ability to raise her L arm, reach.    Baseline L shoulder AROM: 137 degrees flexion, 121 degrees abduction (11/16/2016)   Time 6   Period Weeks   Status New               Plan - 12/07/16 1615    Clinical Impression Statement Increased L shoulder pain during the weekend when pt carried her niece (12 lb) Sunday with her arm in abduction and IR. Decreased pain with exercises strengthening her ER, IR and scapular muscles. Worked on infraspinatus muscle strengthening with arm in scaption to help decrease pain when carrying her 12 lb niece.    Rehab Potential Fair   Clinical Impairments Affecting Rehab Potential chronicity of condition   PT Frequency 2x / week   PT Duration 6 weeks   PT Treatment/Interventions Manual  techniques;Neuromuscular re-education;Dry needling;Patient/family education;Therapeutic exercise;Therapeutic activities;Ultrasound;Iontophoresis 4mg /ml Dexamethasone;Electrical Stimulation;Aquatic Therapy   PT Next Visit Plan gentle ER muscle strengthening, ROM, modalities PRN   Consulted and Agree with Plan of Care Patient      Patient will benefit from skilled therapeutic intervention in order to improve the following deficits and impairments:  Pain, Improper body mechanics, Impaired UE functional use, Decreased strength, Decreased range of motion  Visit Diagnosis: Chronic left shoulder pain  Muscle weakness (generalized)  Problem List Patient Active Problem List   Diagnosis Date Noted  . Major depressive disorder, recurrent episode, moderate (HCC) 05/28/2016  . Tendonitis of left rotator cuff 01/21/2016  . Obesity 05/27/2015  . Acute anxiety 05/26/2015  . Palpitations 05/26/2015  . Insomnia 05/26/2015  . Vitamin D deficiency 05/26/2015    Loralyn Freshwater PT, DPT   12/07/2016, 5:05 PM  [place] [facility] PH[facility] AND SPORTS MEDICINE [address] [place], [address] Phone: [phone]   Fax:  [phone]  Name: Tracie James MRN: [id number] Date of Birth: Jul 03, 1971

## 2016-12-07 NOTE — Patient Instructions (Signed)
  Sitting with your left arm propped up on your table about 80 degrees diagonally and elbow bent to 90 degress:   Rotate your left forearm up then return.    1 lb weight.    10 times for 2 sets every other day.

## 2016-12-09 ENCOUNTER — Ambulatory Visit: Payer: 59

## 2016-12-09 ENCOUNTER — Ambulatory Visit (INDEPENDENT_AMBULATORY_CARE_PROVIDER_SITE_OTHER): Payer: 59 | Admitting: Psychiatry

## 2016-12-09 ENCOUNTER — Encounter (HOSPITAL_COMMUNITY): Payer: Self-pay | Admitting: Psychiatry

## 2016-12-09 VITALS — BP 113/76 | HR 80 | Wt 225.2 lb

## 2016-12-09 DIAGNOSIS — Z818 Family history of other mental and behavioral disorders: Secondary | ICD-10-CM

## 2016-12-09 DIAGNOSIS — Z79899 Other long term (current) drug therapy: Secondary | ICD-10-CM | POA: Diagnosis not present

## 2016-12-09 DIAGNOSIS — F339 Major depressive disorder, recurrent, unspecified: Secondary | ICD-10-CM | POA: Diagnosis not present

## 2016-12-09 DIAGNOSIS — Z881 Allergy status to other antibiotic agents status: Secondary | ICD-10-CM

## 2016-12-09 DIAGNOSIS — Z87891 Personal history of nicotine dependence: Secondary | ICD-10-CM | POA: Diagnosis not present

## 2016-12-09 DIAGNOSIS — Z791 Long term (current) use of non-steroidal anti-inflammatories (NSAID): Secondary | ICD-10-CM

## 2016-12-09 DIAGNOSIS — Z888 Allergy status to other drugs, medicaments and biological substances status: Secondary | ICD-10-CM | POA: Diagnosis not present

## 2016-12-09 DIAGNOSIS — F419 Anxiety disorder, unspecified: Secondary | ICD-10-CM | POA: Diagnosis not present

## 2016-12-09 DIAGNOSIS — F331 Major depressive disorder, recurrent, moderate: Secondary | ICD-10-CM

## 2016-12-09 MED ORDER — BUPROPION HCL ER (XL) 150 MG PO TB24: ORAL_TABLET | ORAL | 0 refills | Status: DC

## 2016-12-09 MED ORDER — FLUOXETINE HCL 40 MG PO CAPS: 80.0000 mg | ORAL_CAPSULE | Freq: Every day | ORAL | 0 refills | Status: DC

## 2016-12-09 MED ORDER — BUPROPION HCL ER (XL) 300 MG PO TB24: ORAL_TABLET | ORAL | 0 refills | Status: DC

## 2016-12-09 MED ORDER — LURASIDONE HCL 60 MG PO TABS: 60.0000 mg | ORAL_TABLET | Freq: Every day | ORAL | 0 refills | Status: DC

## 2016-12-09 NOTE — Patient Instructions (Signed)
1.Continue lurasidone 60 mg daily 2 Continue Wellbutrin 450 mg daily 3. Continue fluoxetine 80 mg daily 4. Continue lorazepam 1 mg as needed for anxiety 5. Return to clinic in two months 6. Consider therapy: Dr. Daisy BlossomSara W Schneidmiller  [phone] [address], [place], [place] [address]

## 2016-12-14 ENCOUNTER — Ambulatory Visit: Payer: 59

## 2016-12-16 ENCOUNTER — Ambulatory Visit: Payer: 59 | Admitting: Physical Therapy

## 2016-12-16 DIAGNOSIS — M25512 Pain in left shoulder: Secondary | ICD-10-CM | POA: Diagnosis not present

## 2016-12-16 DIAGNOSIS — G8929 Other chronic pain: Secondary | ICD-10-CM

## 2016-12-16 DIAGNOSIS — M6281 Muscle weakness (generalized): Secondary | ICD-10-CM

## 2016-12-16 NOTE — Therapy (Signed)
[facility]IC[facility] [address] [place], [place], [address] Phone: [phone]   Fax:  [phone]  Physical Therapy Treatment  Patient Details  Name: Tracie James MRN: [id number] Date of Birth: 11/25/1970 Referring Provider: Gabriel Cirriheryl Wicker, NP  Encounter Date: 12/16/2016      PT End of Session - 12/16/16 1702    Visit Number 7   Number of Visits 13   Date for PT Re-Evaluation 12/30/16   PT Start Time 1703   PT Stop Time 1746   PT Time Calculation (min) 43 min   Activity Tolerance Patient tolerated treatment well   Behavior During Therapy [facility] for tasks assessed/performed      Past Medical History:  Diagnosis Date  . Anxiety   . Asthma   . Bipolar 2 disorder (HCC)   . Depression   . GERD (gastroesophageal reflux disease)   . Palpitations   . PVC (premature ventricular contraction)     Past Surgical History:  Procedure Laterality Date  . ABDOMINAL HYSTERECTOMY    . APPENDECTOMY      There were no vitals filed for this visit.      Subjective Assessment - 12/16/16 1703    Subjective Pt reports she is performing her HEP 1x/day with no questions.  She has no new complaints or concerns.   Pertinent History L shoulder pain. Pt states having pain for about 1.5 years. [facility]. [address] [place], [place]. Does not think that it helped. Was sent to an orthopedic doctor, had a cortisone shot (September 2017) which helped. Had a second cortisone shot 10/20/2016 which helped.  PT treatments included the UBE, dry needling, heating pad, TENS unit, T-band exrecises (shoulder extension), isometric L shoulder horizontal abduction and adduction.  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 Pt continuously pulls specimens out of a bucket (abduction and horizontal abduction) and pulls tubes up (L shoulder flexion) and bar codes them which bothers her  shoulder. Aforementioned repetitive movments oveall throughout the day takes about 2-3 hours.   Diagnostic tests Had an x-ray for her L shoulder with was negative for abnormalities. Has not yet had an MRI   Patient Stated Goals Avoid surgery and no more pain.    Currently in Pain? No/denies   Multiple Pain Sites No       There-ex:  L shoulder ER resisting red band with elbow by side 15x3. Pt demonstrates fatigue toward end of second and third set.   L shoulder IR with red resistance band x15 and again with green resistance band 15x2   Green T-band bilateral scapular retraction in sitting 15x2   L biceps flexion resisting 4 lbs x10 and repeated with 9 lbs 3x10.   Wall pectoralis stretch 45seconds x 2 for L UE   L shoulder rhythmic stabilization with ball against wall at 90 deg L shoulder flexion. X20 counterclockwise, x20 clockwise   L shoulder stabilization with ball against wall with this PT providing horizontal and vertical perturbations. 2x30 seconds   Seated overhead pulldowns at [employer]ME[employer] machine 5# x10, 10# x10. Pt reports fatigue at end of second set.   Standing row to chest with LUE only at [facility] to simulate painful repetitive movement at work to find a painfree range to strengthen and build endurance. Pt able to perform x8 painfree with 5# and again x5 painfree.  Holding 10" dumbbell at side  while ambulating in gym x215 ft   Pt holding 10" dumbbell like she would her 12# niece while ambulating around gym x75 ft before onset of mild pain in L ant shoulder. This is the only exercise pt reported mild pain with at end and was promptly discontinued.  Otherwise no pain throughout session.         PT Education - 12/16/16 1702    Education provided Yes   Education Details Exercise technique; clinical reasoning behind interventions   Person(s) Educated Patient   Methods Explanation;Demonstration;Verbal cues   Comprehension Returned demonstration;Verbalized  understanding;Verbal cues required;Need further instruction             PT Long Term Goals - 11/16/16 1844      PT LONG TERM GOAL #1   Title Patient will have a decrease in L shoulder pain to 3/10 or less at worst to promote ability to perform reaching at work.    Baseline 7/10 L shoulder pain at worst (11/16/2016)   Time 6   Period Weeks   Status New     PT LONG TERM GOAL #2   Title Patient will improve her L shoulder ER muscle strength by at least 1/2 MMT grade to promote ability to raise her L arm and reach.    Baseline L shoulder ER 4/5 (11/16/2016)   Time 6   Period Weeks   Status New     PT LONG TERM GOAL #3   Title Patient will improve her Quick Dash Disability/Symptom Score by at least 8% as a demonstration of improved function.    Baseline 13.63% (pt recently got a cortisone shot) 11/16/2016   Time 6   Period Weeks   Status New     PT LONG TERM GOAL #4   Title Patient will improve L shoulder abduction AROM to al least 140 degrees and L shoulder flexion to at least 145 degrees to promote ability to raise her L arm, reach.    Baseline L shoulder AROM: 137 degrees flexion, 121 degrees abduction (11/16/2016)   Time 6   Period Weeks   Status New               Plan - 12/16/16 1802    Clinical Impression Statement Progressed L shoulder strengthening exercises and educated pt that she may be sore tomorrow and possibly the next day but this should improve.  She tolerated all interventions well.  She responded well to progression of strengthening exercises and stabilization exercises.  Anticipate that as strengthening exercises progress the pt's endurance with aggravating activities at home and work will improve as well.  Pt will benefit from continued skilled PT interventions for improved strength, decreased pain, and improved QOL.   Rehab Potential Fair   Clinical Impairments Affecting Rehab Potential chronicity of condition   PT Frequency 2x / week   PT Duration 6 weeks    PT Treatment/Interventions Manual techniques;Neuromuscular re-education;Dry needling;Patient/family education;Therapeutic exercise;Therapeutic activities;Ultrasound;Iontophoresis 4mg /ml Dexamethasone;Electrical Stimulation;Aquatic Therapy   PT Next Visit Plan gentle ER muscle strengthening, ROM, modalities PRN   Consulted and Agree with Plan of Care Patient      Patient will benefit from skilled therapeutic intervention in order to improve the following deficits and impairments:  Pain, Improper body mechanics, Impaired UE functional use, Decreased strength, Decreased range of motion  Visit Diagnosis: Chronic left shoulder pain  Muscle weakness (generalized)     Problem List Patient Active Problem List   Diagnosis Date Noted  . Major depressive disorder,  recurrent episode, moderate (HCC) 05/28/2016  . Tendonitis of left rotator cuff 01/21/2016  . Obesity 05/27/2015  . Acute anxiety 05/26/2015  . Palpitations 05/26/2015  . Insomnia 05/26/2015  . Vitamin D deficiency 05/26/2015    Encarnacion Chu PT, DPT 12/16/2016, 6:09 PM  [place] [facility] PHYSICAL AND SPORTS MEDICINE [address], [place], [address] Phone: [phone]   Fax:  [phone]  Name: Tracie James MRN: [id number] Date of Birth: [birth year]

## 2016-12-20 ENCOUNTER — Ambulatory Visit: Payer: 59 | Attending: Unknown Physician Specialty

## 2016-12-20 ENCOUNTER — Telehealth: Payer: Self-pay

## 2016-12-20 DIAGNOSIS — G8929 Other chronic pain: Secondary | ICD-10-CM | POA: Insufficient documentation

## 2016-12-20 DIAGNOSIS — M6281 Muscle weakness (generalized): Secondary | ICD-10-CM | POA: Insufficient documentation

## 2016-12-20 DIAGNOSIS — M25512 Pain in left shoulder: Secondary | ICD-10-CM | POA: Insufficient documentation

## 2016-12-20 NOTE — Telephone Encounter (Signed)
No show. Called patient and left a message pertaining to her scheduled appointment and a reminder for her next follow up session. Also asked pt if she would like to reschedule today's session. Return phone call requested. Phone number [phone] provided.

## 2016-12-23 ENCOUNTER — Ambulatory Visit: Payer: 59

## 2016-12-23 DIAGNOSIS — M6281 Muscle weakness (generalized): Secondary | ICD-10-CM | POA: Diagnosis present

## 2016-12-23 DIAGNOSIS — G8929 Other chronic pain: Secondary | ICD-10-CM | POA: Diagnosis present

## 2016-12-23 DIAGNOSIS — M25512 Pain in left shoulder: Principal | ICD-10-CM

## 2016-12-23 NOTE — Therapy (Signed)
[facility] MEDICINE [address], [place], [address] Phone: [phone]   Fax:  [phone]  Physical Therapy Treatment  Patient Details  Name: Tracie James MRN: [id number] Date of Birth: March 15, 1971 Referring Provider: Gabriel Cirri, NP  Encounter Date: 12/23/2016      PT End of Session - 12/23/16 1603    Visit Number 8   Number of Visits 13   Date for PT Re-Evaluation 12/30/16   PT Start Time 1603   PT Stop Time 1650   PT Time Calculation (min) 47 min   Activity Tolerance Patient tolerated treatment well   Behavior During Therapy [facility] for tasks assessed/performed      Past Medical History:  Diagnosis Date  . Anxiety   . Asthma   . Bipolar 2 disorder (HCC)   . Depression   . GERD (gastroesophageal reflux disease)   . Palpitations   . PVC (premature ventricular contraction)     Past Surgical History:  Procedure Laterality Date  . ABDOMINAL HYSTERECTOMY    . APPENDECTOMY      There were no vitals filed for this visit.      Subjective Assessment - 12/23/16 1604    Subjective L shoulder is fine, no pain. Raising her L arm up does not bother her. Held her neice with her other side which is better for her shoulder.   2/10 L shoulder pain at most for the past 7 days.  Pt states turing the steering wheel to the R with her L hand and folding laundry bothers her shoulder. Her mom also had a hernia procedure and has been busy driving from here to [place] every other night. Tha'ts why she was not able to come to PT.    Pertinent History L shoulder pain. Pt states having pain for about 1.5 years. Had PT at [facility] at [facility]. [address] in [place], [place]. Does not think that it helped. Was sent to an orthopedic doctor, had a cortisone shot (September 2017) which helped. Had a second cortisone shot 10/20/2016 which helped.  PT treatments included the UBE, dry needling, heating pad, TENS unit, T-band exrecises  (shoulder extension), isometric L shoulder horizontal abduction and adduction.  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 Pt continuously pulls specimens out of a bucket (abduction and horizontal abduction) and pulls tubes up (L shoulder flexion) and bar codes them which bothers her shoulder. Aforementioned repetitive movments oveall throughout the day takes about 2-3 hours.   Diagnostic tests Had an x-ray for her L shoulder with was negative for abnormalities. Has not yet had an MRI   Patient Stated Goals Avoid surgery and no more pain.    Currently in Pain? No/denies   Pain Score 0-No pain            [facility] PT Assessment - 12/23/16 1606      Observation/Other Assessments   Quick DASH  11.36%     AROM   Overall AROM Comments No pain   Left Shoulder Flexion 153 Degrees   Left Shoulder ABduction 171 Degrees     Strength   Left Shoulder Flexion 4+/5   Left Shoulder ABduction 4/5  L lateral shoulder and arm pain which eases with rest.    Left Shoulder Internal Rotation 4+/5  no pain   Left Shoulder External Rotation 4+/5  PT Education - 12/23/16 1622    Education provided Yes   Education Details ther-ex   Starwood HotelsPerson(s) Educated Patient   Methods Explanation;Demonstration;Tactile cues;Verbal cues   Comprehension Returned demonstration;Verbalized understanding        Objectives    There-ex   L shoulder AROM flexion and abduction 1x each way  Reviewed progress/current status with L shoulder AROM with pt.   L shoulder ER, IR, flexion, abduction 1-2x with manual resistance  Reviewed progress/current status with L shoulder strength with pt.   L shoulder ER resisting yellow band 10x3 at pain free range. Increase to green secondary to using a green band at home from her prior PT sessions per pt reports   No pain with manually resisted L shoulder abduction  afterwards  Red T-band bilateral scapular retraction 10x3 for 5 seconds  L arm propped in about 80 degrees scaption:          L shoulder ER 10x3 with 1 lb weight          L shoulder IR 10x3 resisting red band   L arm propped in about 80 degrees flexion  L shoulder ER 6x, then 5x with 1 lb weight   L shoulder IR resisting red band 10x  Low rows resisting red band 10x   Then 5x5 second holds for 2 sets  Prone scapular retraction with scaption 10x then with horizontal abduction 5x2 thumbs up  L cross arm stretch 5x10 seconds    Improved exercise technique, movement at target joints, use of target muscles after min to mod verbal, visual, tactile cues.   Pt demonstrates significant decrease in L shoulder pain from 7/10 to 2/10 at worst, improved L shoulder flexion and abduction AROM, improved ER and IR muscle strength since initial evaluation. Pt making very good progress towards goals. Continue working on infraspinatus, and subscapularis strength in various degrees of flexion, scaption, and horizontal adduction as well as scapular strengthening to promote ability to use her L arm to perform functional tasks such as driving, folding laundry, and holding her niece with minimal to no L shoulder pain.           PT Long Term Goals - 12/23/16 1703      PT LONG TERM GOAL #1   Title Patient will have a decrease in L shoulder pain to 3/10 or less at worst to promote ability to perform reaching at work.    Baseline 7/10 L shoulder pain at worst (11/16/2016); 2/10 L shoulder pain at most for the past 7 days (12/23/2016)   Time 6   Period Weeks   Status Achieved     PT LONG TERM GOAL #2   Title Patient will improve her L shoulder ER muscle strength by at least 1/2 MMT grade to promote ability to raise her L arm and reach.    Baseline L shoulder ER 4/5 (11/16/2016); 4+/5 (12/23/2016)   Time 6   Period Weeks   Status Achieved     PT LONG TERM GOAL #3   Title Patient will  improve her Quick Dash Disability/Symptom Score by at least 8% as a demonstration of improved function.    Baseline 13.63% (pt recently got a cortisone shot) 11/16/2016; 11.36% (12/23/2016)   Time 6   Period Weeks   Status On-going     PT LONG TERM GOAL #4   Title Patient will improve L shoulder abduction AROM to al least 140 degrees and L shoulder flexion to at least 145 degrees to  promote ability to raise her L arm, reach.    Baseline L shoulder AROM: 137 degrees flexion, 121 degrees abduction (11/16/2016); 153 degrees flexion, 171 degrees abduction AROM (12/23/2016)   Time 6   Period Weeks   Status Achieved     PT LONG TERM GOAL #5   Title Patient will be able to turn the steering wheel to the R with her L hand as well as be able to fold laundry with minimal to no L shoulder pain to promote ability to drive and perform chores.    Baseline L shoulder pain with turning the steering wheel with her L UE and folding clothes (12/23/2016)   Time 6   Period Weeks   Status New               Plan - 12/23/16 1602    Clinical Impression Statement Pt demonstrates significant decrease in L shoulder pain from 7/10 to 2/10 at worst, improved L shoulder flexion and abduction AROM, improved ER and IR muscle strength since initial evaluation. Pt making very good progress towards goals. Continue working on infraspinatus, and subscapularis strength in various degrees of flexion, scaption, and horizontal adduction as well as scapular strengthening to promote ability to use her L arm to perform functional tasks such as driving, folding laundry, and holding her niece with minimal to no L shoulder pain.    Rehab Potential Fair   Clinical Impairments Affecting Rehab Potential chronicity of condition   PT Frequency 2x / week   PT Duration 6 weeks   PT Treatment/Interventions Manual techniques;Neuromuscular re-education;Dry needling;Patient/family education;Therapeutic exercise;Therapeutic  activities;Ultrasound;Iontophoresis 4mg /ml Dexamethasone;Electrical Stimulation;Aquatic Therapy   PT Next Visit Plan gentle ER muscle strengthening, ROM, modalities PRN   Consulted and Agree with Plan of Care Patient      Patient will benefit from skilled therapeutic intervention in order to improve the following deficits and impairments:  Pain, Improper body mechanics, Impaired UE functional use, Decreased strength, Decreased range of motion  Visit Diagnosis: Chronic left shoulder pain  Muscle weakness (generalized)     Problem List Patient Active Problem List   Diagnosis Date Noted  . Major depressive disorder, recurrent episode, moderate (HCC) 05/28/2016  . Tendonitis of left rotator cuff 01/21/2016  . Obesity 05/27/2015  . Acute anxiety 05/26/2015  . Palpitations 05/26/2015  . Insomnia 05/26/2015  . Vitamin D deficiency 05/26/2015   Loralyn Freshwater PT, DPT   12/23/2016, 5:18 PM  [facility] PHYSICAL AND SPORTS MEDICINE [address], [place], [address] Phone: [phone]   Fax:  [phone]  Name: Kandie Keiper MRN: [id number] Date of Birth: [birth year]

## 2016-12-27 ENCOUNTER — Telehealth: Payer: Self-pay

## 2016-12-27 ENCOUNTER — Ambulatory Visit: Payer: 59

## 2016-12-27 NOTE — Telephone Encounter (Signed)
No show. Called patient and left a message pertaining to her scheduled appointement and a reminder for her next follow up session. Return phone call requested. Phone number [phone]) provided.

## 2016-12-29 ENCOUNTER — Ambulatory Visit: Payer: 59

## 2016-12-29 DIAGNOSIS — M25512 Pain in left shoulder: Secondary | ICD-10-CM | POA: Diagnosis not present

## 2016-12-29 DIAGNOSIS — G8929 Other chronic pain: Secondary | ICD-10-CM

## 2016-12-29 DIAGNOSIS — M6281 Muscle weakness (generalized): Secondary | ICD-10-CM

## 2016-12-29 NOTE — Therapy (Signed)
[facility] [address], [place], [address] Phone: [phone]   Fax:  [phone]  Physical Therapy Treatment And Discharge Summary  Patient Details  Name: Tracie James MRN: [id number] Date of Birth: Jun 24, 1971 Referring Provider: Kathrine Haddock, NP  Encounter Date: 12/29/2016      PT End of Session - 12/29/16 1547    Visit Number 9   Number of Visits 13   Date for PT Re-Evaluation 12/30/16   PT Start Time 4097   PT Stop Time 3532   PT Time Calculation (min) 44 min   Activity Tolerance Patient tolerated treatment well   Behavior During Therapy [facility] for tasks assessed/performed      Past Medical History:  Diagnosis Date  . Anxiety   . Asthma   . Bipolar 2 disorder ([place])   . Depression   . GERD (gastroesophageal reflux disease)   . Palpitations   . PVC (premature ventricular contraction)     Past Surgical History:  Procedure Laterality Date  . ABDOMINAL HYSTERECTOMY    . APPENDECTOMY      There were no vitals filed for this visit.      Subjective Assessment - 12/29/16 1549    Subjective L shoulder is doing fine, no pain currently. 1/10 at worst for the past 7 days. Stopped using her L hand only to turn the steering wheel, uses both hands now. Folding the laundry is better for her shoulder. Pt adds that she has to pour a waste bucket at work but is doing it more frequently so it is not heavy and using the good side more with both hands.  Pt states that the PT this time around helped her shoulder. Feels like she can graduate and do her HEP after today. Will get a referral from her MD if she feels like she needs more PT.    Pertinent History L shoulder pain. Pt states having pain for about 1.5 years. Had PT at [facility] [place] [facility] [address] in [place], [place]. Does not think that it helped. Was sent to an orthopedic doctor, had a cortisone shot (September 2017) which helped. Had a second cortisone  shot 10/20/2016 which helped.  PT treatments included the UBE, dry needling, heating pad, TENS unit, T-band exrecises (shoulder extension), isometric L shoulder horizontal abduction and adduction.  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 Pt continuously pulls specimens out of a bucket (abduction and horizontal abduction) and pulls tubes up (L shoulder flexion) and bar codes them which bothers her shoulder. Aforementioned repetitive movments oveall throughout the day takes about 2-3 hours.   Diagnostic tests Had an x-ray for her L shoulder with was negative for abnormalities. Has not yet had an MRI   Patient Stated Goals Avoid surgery and no more pain.    Currently in Pain? No/denies   Pain Score 0-No pain                                 PT Education - 12/29/16 1602    Education provided Yes   Education Details ther-ex, plan of care   Person(s) Educated Patient   Methods Explanation;Demonstration;Tactile cues;Verbal cues   Comprehension Returned demonstration;Verbalized understanding        Objectives    There-ex  Reviewed plan of care: continue with HEP due to very good progress with  PT towards goals. Pt to return with MD PT referral if needed. Pt demonstrated and verbalized understanding.   Reviewed progress/current status with PT towards goals.   L cross arm stretch 5x10 seconds  L arm propped in about 80 degrees scaption:  L shoulder ER 10x3 with 1 lb weight.  L shoulder IR 10x3 resisting red band    L arm propped in about 80 degrees flexion             L shoulder ER 10x2 with 1 lb weight              L shoulder IR resisting red band 10x2  Reviewed and given aforementioned exercises as part of her HEP. Pt demonstrated and verbalized understranding.    Prone scapular retraction with scaption 10x2 then with horizontal abduction 5x2 thumbs up   Low  rows resisting red band 5x5 second holds for 2 sets   Improved exercise technique, movement at target joints, use of target muscles after min to mod verbal, visual, tactile cues.    Pt demonstrates improved L shoulder flexion and abduction AROM, significant decrease in L shoulder pain level at worst, improved ER muscle strength, and improved ability to perform functional tasks such as folding laundry at home since initial evaluation. Pt is also consistent and independent with her exercises at home. Skilled physical therapy services discharged with patient continuing her progress with her HEP.                    PT Long Term Goals - 12/29/16 1607      PT LONG TERM GOAL #1   Title Patient will have a decrease in L shoulder pain to 3/10 or less at worst to promote ability to perform reaching at work.    Baseline 7/10 L shoulder pain at worst (11/16/2016); 2/10 L shoulder pain at most for the past 7 days (12/23/2016); 1/10 L shoulder pain at worst for the past 7 days (12/29/2016)   Time 6   Period Weeks   Status Achieved     PT LONG TERM GOAL #2   Title Patient will improve her L shoulder ER muscle strength by at least 1/2 MMT grade to promote ability to raise her L arm and reach.    Baseline L shoulder ER 4/5 (11/16/2016); 4+/5 (12/23/2016)   Time 6   Period Weeks   Status Achieved     PT LONG TERM GOAL #3   Title Patient will improve her Quick Dash Disability/Symptom Score by at least 8% as a demonstration of improved function.    Baseline 13.63% (pt recently got a cortisone shot) 11/16/2016; 11.36% (12/23/2016)   Time 6   Period Weeks   Status On-going     PT LONG TERM GOAL #4   Title Patient will improve L shoulder abduction AROM to al least 140 degrees and L shoulder flexion to at least 145 degrees to promote ability to raise her L arm, reach.    Baseline L shoulder AROM: 137 degrees flexion, 121 degrees abduction (11/16/2016); 153 degrees flexion, 171 degrees abduction AROM  (12/23/2016)   Time 6   Period Weeks   Status Achieved     PT LONG TERM GOAL #5   Title Patient will be able to turn the steering wheel to the R with her L hand as well as be able to fold laundry with minimal to no L shoulder pain to promote ability to drive and perform chores.    Baseline  L shoulder pain with turning the steering wheel with her L UE and folding clothes (12/23/2016); minimal to no pain with L shoulder for folding laundry (12/29/2016).   Time 6   Period Weeks   Status Partially Met               Plan - 12/29/16 1607    Clinical Impression Statement Pt demonstrates improved L shoulder flexion and abduction AROM, significant decrease in L shoulder pain level at worst, improved ER muscle strength, and improved ability to perform functional tasks such as folding laundry at home since initial evaluation. Pt is also consistent and independent with her exercises at home. Skilled physical therapy services discharged with patient continuing her progress with her HEP.    Clinical Presentation Stable   Clinical Presentation due to: improved shoulder pain, ROM, function   Clinical Decision Making Low   Rehab Potential Fair   Clinical Impairments Affecting Rehab Potential chronicity of condition   PT Frequency --   PT Duration --   PT Treatment/Interventions Manual techniques;Neuromuscular re-education;Patient/family education;Therapeutic exercise;Therapeutic activities   PT Next Visit Plan Continue progress with her home exercise program   Consulted and Agree with Plan of Care Patient      Patient will benefit from skilled therapeutic intervention in order to improve the following deficits and impairments:  Pain, Improper body mechanics, Impaired UE functional use, Decreased strength, Decreased range of motion  Visit Diagnosis: Chronic left shoulder pain  Muscle weakness (generalized)     Problem List Patient Active Problem List   Diagnosis Date Noted  . Major  depressive disorder, recurrent episode, moderate ([place]) 05/28/2016  . Tendonitis of left rotator cuff 01/21/2016  . Obesity 05/27/2015  . Acute anxiety 05/26/2015  . Palpitations 05/26/2015  . Insomnia 05/26/2015  . Vitamin D deficiency 05/26/2015    Thank you for your referral.  Joneen Boers PT, DPT  12/29/2016, 4:39 PM  [facility] PH[facility] AND SPORTS MEDIC[facility] [address], [place], [address] Phone: [phone]   Fax:  [phone]  Name: Tracie James MRN: [id number] Date of Birth: 1970/11/29

## 2016-12-29 NOTE — Patient Instructions (Signed)
L cross arm stretch 5x10 seconds  L arm propped in about 80 degrees scaption:  L shoulder ER 10x3 with 1 lb weight.  L shoulder IR 10x3 resisting red band    L arm propped in about 80 degrees flexion             L shoulder ER 10x2 with 1 lb weight              L shoulder IR resisting red band 10x2  Reviewed and given aforementioned exercises as part of her HEP. Pt demonstrated and verbalized understranding.

## 2017-01-03 ENCOUNTER — Ambulatory Visit: Payer: 59

## 2017-01-06 ENCOUNTER — Ambulatory Visit: Payer: 59

## 2017-01-10 ENCOUNTER — Other Ambulatory Visit: Payer: Self-pay | Admitting: Family Medicine

## 2017-01-10 NOTE — Telephone Encounter (Signed)
Routing to provider. no follow up on file.

## 2017-02-04 NOTE — Progress Notes (Signed)
BH MD/PA/NP OP Progress Note  02/08/2017 4:50 PM Tracie James  MRN:  [id number]  Chief Complaint:  Chief Complaint    Depression; Follow-up     Subjective:  "My fiance says I am bitchy" HPI:  Patient presents for follow up appointment for depression. She states that she has started to paint her kitchen, which she had wanted for many years. She feels better these days, although her fiance thinks tat she is irritable. She feels that she snaps at him with no reason at times. 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 She feels adjusting to it, although it is still challenging for her. She needed to take ativan for anxiety almost every day, although she has not used it for a while. She denies panic attacks. She reports insomnia with night time awakening due to racing thought. She has good appetite. She denies SI, HI, AH/VH.   Visit Diagnosis:    ICD-10-CM   1. Major depressive disorder, recurrent episode, moderate (HCC) F33.1     Past Psychiatric History:  I have reviewed the patient's psychiatry history in detail and updated the patient record.  Outpatient: Dx bipolar II disorder since 46 yo Psychiatry admission: SI in [place] for 3 days in 2010 (does not remember details) Previous suicide attempt: overdoing pills last in 7 years (did not seek medical care), denies SIB Past trials of medication: Cymbalta, Zoloft, Prozac, Lithium (sick), Depakote (spending money), Wellbutrin, Latuda, Abilify (did not work), Seroquel (weight gain, somnolence) Note:  She reports history of spending money worth $1,000 buying a gift in 1990. She denies decreased need for sleep, euphoria, talkativeness. She occasionally has creative ideas but reports she has not put those into action before.  Past Medical History:  Past Medical History:  Diagnosis Date  . Anxiety   . Asthma   . Bipolar 2 disorder (HCC)   . Depression   . GERD  (gastroesophageal reflux disease)   . Palpitations   . PVC (premature ventricular contraction)     Past Surgical History:  Procedure Laterality Date  . ABDOMINAL HYSTERECTOMY    . APPENDECTOMY      Family Psychiatric History:  I have reviewed the patient's family history in detail and updated the patient record. Mother- depression, maternal grandmother- schizophrenia, denies suicide attempt  Family History:  Family History  Problem Relation Age of Onset  . Depression Mother   . Multiple sclerosis Mother   . Schizophrenia Maternal Grandmother   . Heart disease Maternal Grandmother        aortic valve replacement    Social History:  Social History   Social History  . Marital status: Divorced    Spouse name: N/A  . Number of children: N/A  . Years of education: N/A   Social History Main Topics  . Smoking status: Former Smoker    Quit date: 03/31/2014  . Smokeless tobacco: Never Used  . Alcohol use No  . Drug use: No  . Sexual activity: Yes    Partners: Male    Birth control/ protection: Surgical   Other Topics Concern  . None   Social History Narrative  . 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  She moved from [place] to [place] in 2011 after divorce to be with her family. She has two girls, age 16 and 58.  Allergies:  Allergies  Allergen Reactions  . Erythromycin Hives  . Tetracyclines & Related Hives    Metabolic Disorder Labs: No results found for: HGBA1C, MPG No results found for: PROLACTIN No results found for: CHOL, TRIG, HDL, CHOLHDL, VLDL, LDLCALC   Current Medications: Current Outpatient Prescriptions  Medication Sig Dispense Refill  . buPROPion (WELLBUTRIN XL) 150 MG 24 hr tablet Take 300 mg daily and 150 mg daily (total of 450 mg) 90 tablet 0  . buPROPion (WELLBUTRIN XL) 300 MG 24 hr tablet Take 300 mg daily and 150 mg daily (total of 450 mg) 90 tablet 0  .  FLUoxetine (PROZAC) 40 MG capsule Take 2 capsules (80 mg total) by mouth daily. 180 capsule 0  . lansoprazole (PREVACID) 15 MG capsule Take 15 mg by mouth daily at 12 noon.    Marland Kitchen LORazepam (ATIVAN) 1 MG tablet Take 1 tablet (1 mg total) by mouth daily as needed for anxiety. 30 tablet 0  . Lurasidone HCl 60 MG TABS Take 1 tablet (60 mg total) by mouth daily with breakfast. 90 tablet 0  . meloxicam (MOBIC) 15 MG tablet TAKE 1 TABLET BY MOUTH EVERY DAY 30 tablet 0  . methocarbamol (ROBAXIN) 750 MG tablet TAKE 1 TABLET (750 MG TOTAL) BY MOUTH 4 (FOUR) TIMES DAILY AS NEEDED. 30 tablet 0  . metoprolol succinate (TOPROL-XL) 50 MG 24 hr tablet TAKE 1 TABLET (50 MG TOTAL) BY MOUTH DAILY. TAKE WITH OR IMMEDIATELY FOLLOWING A MEAL. 90 tablet 1  . vitamin B-12 (CYANOCOBALAMIN) 1000 MCG tablet Take 1,000 mcg by mouth daily.    . Vitamin D, Cholecalciferol, 1000 UNITS TABS Take 5,000 Units by mouth daily.      No current facility-administered medications for this visit.     Neurologic: Headache: No Seizure: No Paresthesias: No  Musculoskeletal: Strength & Muscle Tone: within normal limits Gait & Station: normal Patient leans: N/A  Psychiatric Specialty Exam: Review of Systems  Psychiatric/Behavioral: Positive for depression. Negative for hallucinations, substance abuse and suicidal ideas. The patient is nervous/anxious and has insomnia.   All other systems reviewed and are negative.   Blood pressure 132/87, pulse 90, height 5\' 4"  (1.626 m), weight 224 lb 12.8 oz (102 kg).Body mass index is 38.59 kg/m.  General Appearance: Fairly Groomed  Eye Contact:  Good  Speech:  Clear and Coherent  Volume:  Normal  Mood:  "fine"  Affect:  Appropriate and slightly restricte, laughs at times  Thought Process:  Coherent and Goal Directed  Orientation:  Full (Time, Place, and Person)  Thought Content: Logical  Perceptions: denies AH/VH  Suicidal Thoughts:  No  Homicidal Thoughts:  No  Memory:  Immediate;    Good Recent;   Good Remote;   Good  Judgement:  Good  Insight:  Fair  Psychomotor Activity:  Normal  Concentration:  Concentration: Good and Attention Span: Good  Recall:  Good  Fund of Knowledge: Good  Language: Good  Akathisia:  No  Handed:  Right  AIMS (if indicated):  N/A  Assets:  Communication Skills Desire for Improvement  ADL's:  Intact  Cognition: WNL  Sleep:  poor   Assessment Tracie James is a 46 y.o. year old female with a history of depression, anxiety, who presents for follow up appointment for Major depressive disorder, recurrent episode, moderate (HCC)   Psychosocial stressors including divorce in 2011, and discordance with her ex-husband over custody issues.  # MDD, moderate, recurrent without psychotic features Although she reports slightly heightened anxiety and  irritability in the setting of job change, she appears to cope stress well. Will continue fluoxetine, Wellbutrin for depression and lurasidone as adjunctive treatment. Will continue lorazepam prn for anxiety. She will greatly benefit from CBT; referral is made.   Plan 1. Continue lurasidone 60 mg daily  2. Continue Wellbutrin 450 mg daily 3. Continue fluoxetine 80 mg daily 4. Continue lorazepam 1 mg as needed for anxiety 5. Return to clinic in one month 6. Referral to therapy  Treatment Plan Summary:Plan as above   The patient demonstrates the following risk factors for suicide: Chronic risk factors for suicide include: psychiatric disorder of depressionand previous suicide attempts of overdosing medication. Acute risk factorsfor suicide include: family or marital conflict. Protective factorsfor this patient include: responsibility to others (children, family), coping skills and hope for the future. Patient is future oriented and agrees to come for next appointment.Considering these factors, the overall suicide risk at this point appears to be low. Patient isappropriate for outpatient follow    [name] Leinani Lisbon, MD 02/08/2017, 4:50 PM

## 2017-02-08 ENCOUNTER — Ambulatory Visit (INDEPENDENT_AMBULATORY_CARE_PROVIDER_SITE_OTHER): Payer: 59 | Admitting: Psychiatry

## 2017-02-08 ENCOUNTER — Encounter (HOSPITAL_COMMUNITY): Payer: Self-pay | Admitting: Psychiatry

## 2017-02-08 VITALS — BP 132/87 | HR 90 | Ht 64.0 in | Wt 224.8 lb

## 2017-02-08 DIAGNOSIS — G4700 Insomnia, unspecified: Secondary | ICD-10-CM | POA: Diagnosis not present

## 2017-02-08 DIAGNOSIS — Z818 Family history of other mental and behavioral disorders: Secondary | ICD-10-CM | POA: Diagnosis not present

## 2017-02-08 DIAGNOSIS — F331 Major depressive disorder, recurrent, moderate: Secondary | ICD-10-CM

## 2017-02-08 DIAGNOSIS — Z87891 Personal history of nicotine dependence: Secondary | ICD-10-CM | POA: Diagnosis not present

## 2017-02-08 MED ORDER — BUPROPION HCL ER (XL) 300 MG PO TB24: ORAL_TABLET | ORAL | 0 refills | Status: DC

## 2017-02-08 MED ORDER — LORAZEPAM 1 MG PO TABS: 1.0000 mg | ORAL_TABLET | Freq: Every day | ORAL | 0 refills | Status: DC | PRN

## 2017-02-08 MED ORDER — BUPROPION HCL ER (XL) 150 MG PO TB24: ORAL_TABLET | ORAL | 0 refills | Status: DC

## 2017-02-08 MED ORDER — LURASIDONE HCL 60 MG PO TABS: 60.0000 mg | ORAL_TABLET | Freq: Every day | ORAL | 0 refills | Status: DC

## 2017-02-08 MED ORDER — FLUOXETINE HCL 40 MG PO CAPS: 80.0000 mg | ORAL_CAPSULE | Freq: Every day | ORAL | 0 refills | Status: DC

## 2017-02-08 NOTE — Patient Instructions (Signed)
1. Continue lurasidone 60 mg daily  2. Continue Wellbutrin 450 mg daily 3. Continue fluoxetine 80 mg daily 4. Continue lorazepam 1 mg as needed for anxiety 5. Return to clinic in one month 6. Referral to therapy

## 2017-02-09 ENCOUNTER — Other Ambulatory Visit: Payer: Self-pay | Admitting: Unknown Physician Specialty

## 2017-03-03 NOTE — Progress Notes (Signed)
BH MD/PA/NP OP Progress Note  03/11/2017 9:05 AM Tracie James  MRN:  [id number]  Chief Complaint:  Chief Complaint    Depression; Anxiety     Subjective:  "I'm irritable" HPI:  Patient presents for follow up appointment for depression. She states that she continues to feel irritable. She talks about an episode when her boyfriend changed his mind this morning and declines to come to the appointment with her. She did not react on it, although she became very angry in a car. She denies any inappropriate driving or yelling. She still has difficulty at work, although she feels that she is able to manage things better. She did not make an appointment for therapy, stating that she did not feel comfortable seeing a therapist. She used to see a therapist in [place], although she does not remember what she worked on together. Discussed the rationale of seeing a therapist, for processing and also to explore her pattern of cognitive distortion which leads to anxiety/depression. She is interested in seeing a therapist.   She has improved sleep. She reports that days of depression and anhedonia. She is more motivated and is working on Pension scheme manager. She denies SI, HI, AH, VH. She feels anxious at times and feels tense. She did not need to take any Ativan since the last appointment. She denies panic attacks.   Visit Diagnosis:    ICD-10-CM   1. Major depressive disorder, recurrent episode, moderate (HCC) F33.1     Past Psychiatric History:  I have reviewed the patient's psychiatry history in detail and updated the patient record. Outpatient: Dx bipolar II disorder since 46 yo Psychiatry admission: SI in [place] for 3 days in 2010 (does not remember details) Previous suicide attempt: overdoing pills last in 7 years (did not seek medical care), denies SIB Past trials of medication: Cymbalta, Zoloft, Prozac, Lithium (sick), Depakote (spending money), Wellbutrin, Latuda, Abilify (did not work), Seroquel  (weight gain, somnolence) Note:  She reports history of spending money worth $1,000 buying a gift in 1990. She denies decreased need for sleep, euphoria, talkativeness. She occasionally has creative ideas but reports she has not put those into action before.  Past Medical History:  Past Medical History:  Diagnosis Date  . Anxiety   . Asthma   . Bipolar 2 disorder (HCC)   . Depression   . GERD (gastroesophageal reflux disease)   . Palpitations   . PVC (premature ventricular contraction)     Past Surgical History:  Procedure Laterality Date  . ABDOMINAL HYSTERECTOMY    . APPENDECTOMY      Family Psychiatric History:  I have reviewed the patient's family history in detail and updated the patient record. Mother- depression, maternal grandmother- schizophrenia, denies suicide attempt  Family History:  Family History  Problem Relation Age of Onset  . Depression Mother   . Multiple sclerosis Mother   . Schizophrenia Maternal Grandmother   . Heart disease Maternal Grandmother        aortic valve replacement    Social History:  Social History   Social History  . Marital status: Divorced    Spouse name: N/A  . Number of children: N/A  . Years of education: N/A   Social History Main Topics  . Smoking status: Former Smoker    Quit date: 03/31/2014  . Smokeless tobacco: Never Used  . Alcohol use No  . Drug use: No  . Sexual activity: Yes    Partners: Male    Birth control/ protection: Surgical  Other Topics Concern  . Not on file   Social History Narrative  . 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  [place] in [place] after divorce to be with her family. She has two girls, age 48 and 3211.   Allergies:  Allergies  Allergen Reactions  . Erythromycin Hives  . Tetracyclines & Related Hives    Metabolic Disorder Labs: No results found for: HGBA1C,  MPG No results found for: PROLACTIN No results found for: CHOL, TRIG, HDL, CHOLHDL, VLDL, LDLCALC   Current Medications: Current Outpatient Prescriptions  Medication Sig Dispense Refill  . buPROPion (WELLBUTRIN XL) 150 MG 24 hr tablet Take 300 mg daily and 150 mg daily (total of 450 mg) 90 tablet 0  . buPROPion (WELLBUTRIN XL) 300 MG 24 hr tablet Take 300 mg daily and 150 mg daily (total of 450 mg) 90 tablet 0  . FLUoxetine (PROZAC) 40 MG capsule Take 2 capsules (80 mg total) by mouth daily. 180 capsule 0  . lansoprazole (PREVACID) 15 MG capsule Take 15 mg by mouth daily at 12 noon.    Marland Kitchen. LORazepam (ATIVAN) 1 MG tablet Take 1 tablet (1 mg total) by mouth daily as needed for anxiety. 30 tablet 0  . Lurasidone HCl 60 MG TABS Take 1 tablet (60 mg total) by mouth daily with breakfast. 90 tablet 0  . meloxicam (MOBIC) 15 MG tablet TAKE 1 TABLET BY MOUTH EVERY DAY 30 tablet 0  . methocarbamol (ROBAXIN) 750 MG tablet TAKE 1 TABLET (750 MG TOTAL) BY MOUTH 4 (FOUR) TIMES DAILY AS NEEDED. 30 tablet 0  . metoprolol succinate (TOPROL-XL) 50 MG 24 hr tablet TAKE 1 TABLET (50 MG TOTAL) BY MOUTH DAILY. TAKE WITH OR IMMEDIATELY FOLLOWING A MEAL. 90 tablet 1  . vitamin B-12 (CYANOCOBALAMIN) 1000 MCG tablet Take 5,000 mcg by mouth daily.     . Vitamin D, Cholecalciferol, 1000 UNITS TABS Take 5,000 Units by mouth daily.      No current facility-administered medications for this visit.     Neurologic: Headache: No Seizure: No Paresthesias: No  Musculoskeletal: Strength & Muscle Tone: within normal limits Gait & Station: normal Patient leans: N/A  Psychiatric Specialty Exam: Review of Systems  Psychiatric/Behavioral: Positive for depression. Negative for hallucinations, substance abuse and suicidal ideas. The patient is nervous/anxious and has insomnia.   All other systems reviewed and are negative.   Blood pressure 116/82, pulse 84, height 5\' 4"  (1.626 m), weight 223 lb 3.2 oz (101.2 kg).Body mass  index is 38.31 kg/m.  General Appearance: Fairly Groomed  Eye Contact:  Good  Speech:  Clear and Coherent  Volume:  Normal  Mood:  Irritable  Affect:  Appropriate, Congruent and slightly restricted  Thought Process:  Coherent and Goal Directed  Orientation:  Full (Time, Place, and Person)  Thought Content: Logical Perceptions: denies AH/VH  Suicidal Thoughts:  No  Homicidal Thoughts:  No  Memory:  Immediate;   Good Recent;   Good Remote;   Good  Judgement:  Good  Insight:  Fair  Psychomotor Activity:  Normal  Concentration:  Concentration: Good and Attention Span: Good  Recall:  Good  Fund of Knowledge: Good  Language: Good  Akathisia:  NA  Handed:  Right  AIMS (if indicated):  N/A  Assets:  Communication Skills Desire for Improvement  ADL's:  Intact  Cognition: WNL  Sleep:  better   Assessment Promiss Jones Skene is a 46 y.o. year old female with a history of depression, who presents for follow up appointment for Major depressive disorder, recurrent episode, moderate (HCC) Psychosocial stressors including divorce in 2011 and discordance with her ex-husband over custody issues.   # MDD, moderate, recurrent without psychotic features She reports overall improved symptoms since on current medication. Will continue Wellbutrin, fluoxetine for depression and lurasidone for adjunctive treatment for depression. Will continue ativan prn for anxiety. Discussed cognitive distortion of mental filtering. She will greatly benefit from CBT; make a referral.   Plan 1. Continue lurasidone 60 mg daily 2. Continue Wellbutrin 450 mg daily 3. Continue fluoxetine 80 mg daily 4. Continue lorazepam 1 mg as needed for anxiety (she declines refill) 5. Return to clinic in three months 6. Referral to therapy  The patient demonstrates the following risk factors for suicide: Chronic risk factors for suicide include: psychiatric disorder of depressionand previous suicide attempts of overdosing  medication. Acute risk factorsfor suicide include: family or marital conflict. Protective factorsfor this patient include: responsibility to others (children, family), coping skills and hope for the future. Patient is future oriented and agrees to come for next appointment.Considering these factors, the overall suicide risk at this point appears to be low. Patient isappropriate for outpatient follow   Treatment Plan Summary:Plan as above   [name], MD 03/11/2017, 9:05 AM

## 2017-03-10 ENCOUNTER — Other Ambulatory Visit: Payer: Self-pay | Admitting: Unknown Physician Specialty

## 2017-03-11 ENCOUNTER — Ambulatory Visit (INDEPENDENT_AMBULATORY_CARE_PROVIDER_SITE_OTHER): Payer: 59 | Admitting: Psychiatry

## 2017-03-11 VITALS — BP 116/82 | HR 84 | Ht 64.0 in | Wt 223.2 lb

## 2017-03-11 DIAGNOSIS — Z79899 Other long term (current) drug therapy: Secondary | ICD-10-CM

## 2017-03-11 DIAGNOSIS — F331 Major depressive disorder, recurrent, moderate: Secondary | ICD-10-CM | POA: Diagnosis not present

## 2017-03-11 DIAGNOSIS — F419 Anxiety disorder, unspecified: Secondary | ICD-10-CM | POA: Diagnosis not present

## 2017-03-11 DIAGNOSIS — Z87891 Personal history of nicotine dependence: Secondary | ICD-10-CM

## 2017-03-11 MED ORDER — FLUOXETINE HCL 40 MG PO CAPS: 80.0000 mg | ORAL_CAPSULE | Freq: Every day | ORAL | 0 refills | Status: DC

## 2017-03-11 MED ORDER — BUPROPION HCL ER (XL) 300 MG PO TB24: ORAL_TABLET | ORAL | 0 refills | Status: DC

## 2017-03-11 MED ORDER — LURASIDONE HCL 60 MG PO TABS: 60.0000 mg | ORAL_TABLET | Freq: Every day | ORAL | 0 refills | Status: DC

## 2017-03-11 MED ORDER — BUPROPION HCL ER (XL) 150 MG PO TB24: ORAL_TABLET | ORAL | 0 refills | Status: DC

## 2017-03-11 NOTE — Patient Instructions (Signed)
1. Continue lurasidone 60 mg daily 2. Continue Wellbutrin 450 mg daily 3. Continue fluoxetine 80 mg daily 4. Continue lorazepam 1 mg as needed for anxiety 5. Return to clinic in three months 6. Referral to therapy

## 2017-04-05 ENCOUNTER — Ambulatory Visit (HOSPITAL_COMMUNITY): Payer: Self-pay | Admitting: Licensed Clinical Social Worker

## 2017-04-19 ENCOUNTER — Ambulatory Visit (INDEPENDENT_AMBULATORY_CARE_PROVIDER_SITE_OTHER): Payer: 59 | Admitting: Licensed Clinical Social Worker

## 2017-04-19 DIAGNOSIS — F331 Major depressive disorder, recurrent, moderate: Secondary | ICD-10-CM | POA: Diagnosis not present

## 2017-04-19 NOTE — Progress Notes (Signed)
Comprehensive Clinical Assessment (CCA) Note  04/19/2017 Tracie James [id number]  Visit Diagnosis:      ICD-10-CM   1. Major depressive disorder, recurrent episode, moderate (HCC) F33.1       CCA Part One  Part One has been completed on paper by the patient.  (See scanned document in Chart Review)  CCA Part Two A  Intake/Chief Complaint:  CCA Intake With Chief Complaint CCA Part Two Date: 04/19/17 CCA Part Two Time: 1406 Chief Complaint/Presenting Problem: Depression and anxiety (Patient is a 46 year old Caucasian female that presents oriented x5 (person, place, situation, time and object), alert, overweight, average height, and cooperative) Patients Currently Reported Symptoms/Problems: Mood: irritability, episodes of tearfulness, Anxiety: gets quiet, feels likes she is going to explode  Collateral Involvement: None  Individual's Strengths: Couldn't identify strenghs Individual's Preferences: Prefer not to go to work, prefers to stay at home, doesn't prefer to go out, doesn't prefer talking  Individual's Abilities: Designer, industrial/product, computers, cook  Type of Services Patient Feels Are Needed: Medication  Initial Clinical Notes/Concerns: Symptoms started around her 20's after her mother moved, symptoms occur daily, symptoms are moderate   Mental Health Symptoms Depression:  Depression: Irritability, Tearfulness  Mania:  Mania: N/A  Anxiety:   Anxiety: Tension, Irritability, Worrying  Psychosis:  Psychosis: N/A  Trauma:  Trauma: N/A  Obsessions:  Obsessions: N/A  Compulsions:  Compulsions: N/A  Inattention:  Inattention: N/A  Hyperactivity/Impulsivity:  Hyperactivity/Impulsivity: N/A  Oppositional/Defiant Behaviors:  Oppositional/Defiant Behaviors: N/A  Borderline Personality:  Emotional Irregularity: N/A  Other Mood/Personality Symptoms:  Other Mood/Personality Symtpoms: None    Mental Status Exam Appearance and self-care  Stature:  Stature: Average  Weight:  Weight: Average  weight  Clothing:  Clothing: Casual  Grooming:  Grooming: Normal  Cosmetic use:  Cosmetic Use: Age appropriate  Posture/gait:  Posture/Gait: Normal  Motor activity:  Motor Activity: Not Remarkable  Sensorium  Attention:  Attention: Normal  Concentration:  Concentration: Normal  Orientation:  Orientation: X5  Recall/memory:  Recall/Memory: Normal  Affect and Mood  Affect:  Affect: Appropriate  Mood:  Mood: Euthymic  Relating  Eye contact:  Eye Contact: Normal  Facial expression:  Facial Expression: Responsive  Attitude toward examiner:  Attitude Toward Examiner: Cooperative  Thought and Language  Speech flow: Speech Flow: Normal  Thought content:  Thought Content: Appropriate to mood and circumstances  Preoccupation:  Preoccupations:  (None)  Hallucinations:  Hallucinations:  (None)  Organization:   Logical   Company secretary of Knowledge:  Fund of Knowledge: Average  Intelligence:  Intelligence: Average  Abstraction:  Abstraction: Normal  Judgement:  Judgement: Normal  Reality Testing:  Reality Testing: Adequate  Insight:  Insight: Good  Decision Making:  Decision Making: Normal  Social Functioning  Social Maturity:  Social Maturity: Isolates  Social Judgement:  Social Judgement: Normal  Stress  Stressors:  Stressors: Work, Transitions  Coping Ability:  Coping Ability: Normal  Skill Deficits:   Sensitive  Supports:   daughter, boyfriend, family    Family and Psychosocial History: Family history Marital status: Divorced Divorced, when?: 2011 What types of issues is patient dealing with in the relationship?: Doesn't get along with her ex  Are you sexually active?: Yes What is your sexual orientation?: Heterosexual  Has your sexual activity been affected by drugs, alcohol, medication, or emotional stress?: None Does patient have children?: Yes How many children?: 2 How is patient's relationship with their children?: Good relationship with children    Childhood History:  Childhood History By whom was/is the patient raised?: Mother Additional childhood history information: Spent summers with father Description of patient's relationship with caregiver when they were a child: Mother: typical relationship with mother,   Father:  Good relationship Patient's description of current relationship with people who raised him/her: Mother: Good relationship, Father: Good relationship but he makes her nervous How were you disciplined when you got in trouble as a child/adolescent?: Spanked  Does patient have siblings?: Yes Number of Siblings: 1 Description of patient's current relationship with siblings: Ok relationship with Brother  Did patient suffer any verbal/emotional/physical/sexual abuse as a child?: No Did patient suffer from severe childhood neglect?: No Has patient ever been sexually abused/assaulted/raped as an adolescent or adult?: No Was the patient ever a victim of a crime or a disaster?: No Witnessed domestic violence?: No Has patient been effected by domestic violence as an adult?: No  CCA Part Two B  Employment/Work Situation: Employment / Work Situation Employment situation: Employed Where is patient currently employed?: [employer] long has patient been employed?: 8 years Patient's job has been impacted by current illness: Yes Describe how patient's job has been impacted: Anxiety has impacted her ability to interact with some people What is the longest time patient has a held a job?: 10 years Where was the patient employed at that time?: [employer]  Has patient ever been in the [employer]?: No Has patient ever served in combat?: No Did You Receive Any Psychiatric Treatment/Services While in Equities trader?: No Are There Guns or Other Weapons in Your Home?: Yes Types of Guns/Weapons: two rifles in the home and a handgun hidden  Are These Comptroller?: Yes  Education: Education School Currently  Attending: N/A: Adult  Last Grade Completed: 12 Name of High School: [employer]  Did Garment/textile technologist From [employer]?: Yes Did Theme park manager?: Yes What Type of College Degree Do you Have?: Associates Did You Attend Graduate School?: No 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  Did You Have Any Special Interests In School?: Science  Did You Have An Individualized Education Program (IIEP): No Did You Have Any Difficulty At School?: Yes Were Any Medications Ever Prescribed For These Difficulties?: No  Religion: Religion/Spirituality Are You A Religious Person?: No How Might This Affect Treatment?: No impact  Leisure/Recreation: Leisure / Recreation Leisure and Hobbies: Scrapbooking, Training and development officer   Exercise/Diet: Exercise/Diet Do You Exercise?: No Have You Gained or Lost A Significant Amount of Weight in the Past Six Months?: No Do You Follow a Special Diet?: No Do You Have Any Trouble Sleeping?: No  CCA Part Two C  Alcohol/Drug Use: Alcohol / Drug Use Pain Medications: None Prescriptions: None Over the Counter: None History of alcohol / drug use?: No history of alcohol / drug abuse                      CCA Part Three  ASAM's:  Six Dimensions of Multidimensional Assessment  Dimension 1:  Acute Intoxication and/or Withdrawal Potential:  Dimension 1:  Comments: None  Dimension 2:  Biomedical Conditions and Complications:  Dimension 2:  Comments: None  Dimension 3:  Emotional, Behavioral, or Cognitive Conditions and Complications:  Dimension 3:  Comments: None  Dimension 4:  Readiness to Change:  Dimension 4:  Comments: None  Dimension 5:  Relapse, Continued use, or Continued Problem Potential:  Dimension 5:  Comments: None  Dimension 6:  Recovery/Living Environment:  Dimension 6:  Recovery/Living Environment Comments: None  Substance use Disorder (SUD)    Social Function:  Social Functioning Social Maturity: Isolates Social Judgement:  Normal  Stress:  Stress Stressors: Work, Transitions Coping Ability: Normal Patient Takes Medications The Way The Doctor Instructed?: Yes Priority Risk: Low Acuity  Risk Assessment- Self-Harm Potential: Risk Assessment For Self-Harm Potential Thoughts of Self-Harm: No current thoughts Method: No plan Availability of Means: No access/NA  Risk Assessment -Dangerous to Others Potential: Risk Assessment For Dangerous to Others Potential Method: No Plan Availability of Means: No access or NA Intent: Vague intent or NA Notification Required: No need or identified person  DSM5 Diagnoses: Patient Active Problem List   Diagnosis Date Noted  . Major depressive disorder, recurrent episode, moderate (HCC) 05/28/2016  . Tendonitis of left rotator cuff 01/21/2016  . Obesity 05/27/2015  . Acute anxiety 05/26/2015  . Palpitations 05/26/2015  . Insomnia 05/26/2015  . Vitamin D deficiency 05/26/2015    Patient Centered Plan: Patient is on the following Treatment Plan(s):  Depression  Recommendations for Services/Supports/Treatments: Recommendations for Services/Supports/Treatments Recommendations For Services/Supports/Treatments: Individual Therapy, Medication Management  Treatment Plan Summary:   Patient is a 46 year old Caucasian female that presents oriented x5 (person, place, situation, time and object), alert, overweight, average height, and cooperative for an assessment on a referral from Dr. Vanetta Shawl to address mood. Patient has a minimal history of medical treatment including insomnia. Patient has a history of mental health treatment including medication management and outpatient therapy. Patient denies symptoms of mania. Patient denies suicidal and homicidal ideations. Patient denies psychosis including auditory and visual hallucinations. Patient denies substance abuse. Patient would benefit from outpatient therapy with a CBT approach to address mood and anxiety. Patient would benefit  from medication management to address mood.   Referrals to Alternative Service(s): Referred to Alternative Service(s):   Place:   Date:   Time:    Referred to Alternative Service(s):   Place:   Date:   Time:    Referred to Alternative Service(s):   Place:   Date:   Time:    Referred to Alternative Service(s):   Place:   Date:   Time:     Bynum Bellows, LCSW

## 2017-05-04 ENCOUNTER — Other Ambulatory Visit: Payer: Self-pay | Admitting: Unknown Physician Specialty

## 2017-05-16 ENCOUNTER — Ambulatory Visit (HOSPITAL_COMMUNITY): Payer: Self-pay | Admitting: Licensed Clinical Social Worker

## 2017-05-29 ENCOUNTER — Other Ambulatory Visit: Payer: Self-pay | Admitting: Unknown Physician Specialty

## 2017-05-30 NOTE — Telephone Encounter (Signed)
Does pt need office visit prior to getting refill; requesting metoptolol XL

## 2017-05-30 NOTE — Telephone Encounter (Signed)
This is not one of [facility] patients

## 2017-05-30 NOTE — Telephone Encounter (Signed)
Does this patient need an office visit prior to refill; requesting refill for metoprolol XL

## 2017-05-31 NOTE — Telephone Encounter (Signed)
Last visit for HTN was 04/2016;Spoke with Ephriam Knuckleshristian regarding this; she will discuss with Gabriel Cirriheryl Wicker, NP; they will contact pt regarding appt

## 2017-05-31 NOTE — Telephone Encounter (Signed)
Pres[place] [facility] [place]. Patient is still over due for f/up visit.

## 2017-06-01 ENCOUNTER — Other Ambulatory Visit: Payer: Self-pay | Admitting: Unknown Physician Specialty

## 2017-06-01 MED ORDER — MELOXICAM 15 MG PO TABS: 15.0000 mg | ORAL_TABLET | Freq: Every day | ORAL | 2 refills | Status: DC

## 2017-06-01 NOTE — Telephone Encounter (Signed)
Does this patient need to be seen before getting refill? It looks like it has been greater than 1 year since she was last seen for this problem.

## 2017-06-01 NOTE — Telephone Encounter (Signed)
Tracie James,  Please see the note from the Triage Nurse.  Thanks

## 2017-06-01 NOTE — Addendum Note (Signed)
Addended by: Gabriel CirriWICKER, Arelene Moroni on: 06/01/2017 08:54 AM   Modules accepted: Orders

## 2017-06-08 ENCOUNTER — Ambulatory Visit (HOSPITAL_COMMUNITY): Payer: Self-pay | Admitting: Psychiatry

## 2017-06-13 ENCOUNTER — Ambulatory Visit (INDEPENDENT_AMBULATORY_CARE_PROVIDER_SITE_OTHER): Payer: 59 | Admitting: Psychiatry

## 2017-06-13 ENCOUNTER — Encounter (HOSPITAL_COMMUNITY): Payer: Self-pay | Admitting: Psychiatry

## 2017-06-13 VITALS — BP 112/76 | HR 77 | Ht 64.0 in | Wt 228.0 lb

## 2017-06-13 DIAGNOSIS — R450 Nervousness: Secondary | ICD-10-CM

## 2017-06-13 DIAGNOSIS — Z818 Family history of other mental and behavioral disorders: Secondary | ICD-10-CM

## 2017-06-13 DIAGNOSIS — F331 Major depressive disorder, recurrent, moderate: Secondary | ICD-10-CM | POA: Diagnosis not present

## 2017-06-13 DIAGNOSIS — Z87891 Personal history of nicotine dependence: Secondary | ICD-10-CM | POA: Diagnosis not present

## 2017-06-13 DIAGNOSIS — F419 Anxiety disorder, unspecified: Secondary | ICD-10-CM

## 2017-06-13 MED ORDER — BUPROPION HCL ER (XL) 150 MG PO TB24: ORAL_TABLET | ORAL | 0 refills | Status: DC

## 2017-06-13 MED ORDER — FLUOXETINE HCL 40 MG PO CAPS: 80.0000 mg | ORAL_CAPSULE | Freq: Every day | ORAL | 0 refills | Status: DC

## 2017-06-13 MED ORDER — BUPROPION HCL ER (XL) 300 MG PO TB24: ORAL_TABLET | ORAL | 0 refills | Status: DC

## 2017-06-13 MED ORDER — LURASIDONE HCL 60 MG PO TABS: 60.0000 mg | ORAL_TABLET | Freq: Every day | ORAL | 0 refills | Status: DC

## 2017-06-13 NOTE — Progress Notes (Signed)
BH MD/PA/NP OP Progress Note  06/13/2017 4:05 PM Tracie James  MRN:  [id number]  Chief Complaint:  Chief Complaint    Depression; Anxiety; Follow-up     HPI:  Patient presents for follow-up appointment for depression.  She states that she has been doing well.  There were times she feels depressed and anxious as she had a thought that what would happen if her husband does not take her daughter's back.  Although she knows it is not rational, she cannot help thinking about it.  She feels less irritable.  She had a good Thanksgiving with her daughters.  She denies insomnia.  She has good motivation and energy.  She denies SI.  She denies panic attacks.   Per PMP,  Ativan last filled on 04/06/2017   Visit Diagnosis:    ICD-10-CM   1. Major depressive disorder, recurrent episode, moderate (HCC) F33.1     Past Psychiatric History:  I have reviewed the patient's psychiatry history in detail and updated the patient record. Outpatient: Dx bipolar II disorder since 46 yo Psychiatry admission: [place] in [place] for 3 days in 2010 (does not remember details) Previous suicide attempt: overdoing pills last in 7 years (did not seek medical care), denies SIB Past trials of medication: Cymbalta, Zoloft, Prozac, Lithium (sick), Depakote (spending money), Wellbutrin, Latuda, Abilify (did not work), Seroquel (weight gain, somnolence) Note:  She reports history of spending money worth $1,000 buying a gift in 1990. She denies decreased need for sleep, euphoria, talkativeness. She occasionally has creative ideas but reports she has not put those into action before.    Past Medical History:  Past Medical History:  Diagnosis Date  . Anxiety   . Asthma   . Bipolar 2 disorder (HCC)   . Depression   . GERD (gastroesophageal reflux disease)   . Palpitations   . PVC (premature ventricular contraction)     Past Surgical History:  Procedure Laterality Date  . ABDOMINAL HYSTERECTOMY    . APPENDECTOMY       Family Psychiatric History:  I have reviewed the patient's family history in detail and updated the patient record.  Family History:  Family History  Problem Relation Age of Onset  . Depression Mother   . Multiple sclerosis Mother   . Schizophrenia Maternal Grandmother   . Heart disease Maternal Grandmother        aortic valve replacement    Social History:  Social History   Socioeconomic History  . Marital status: Divorced    Spouse name: None  . Number of children: None  . Years of education: None  . Highest education level: None  Social Needs  . Financial resource strain: None  . Food insecurity - worry: None  . Food insecurity - inability: None  . Transportation needs - medical: None  . Transportation needs - non-medical: None  Occupational History  . None  Tobacco Use  . Smoking status: Former Smoker    Last attempt to quit: 03/31/2014    Years since quitting: 3.2  . Smokeless tobacco: Never Used  Substance and Sexual Activity  . Alcohol use: No  . Drug use: No  . Sexual activity: Yes    Partners: Male    Birth control/protection: Surgical  Other Topics Concern  . None  Social History Narrative  . 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  She moved from [place] to  [place] in 2011 after divorce to be with her family. She has two girls, age 77 and 32.      Allergies:  Allergies  Allergen Reactions  . Erythromycin Hives  . Tetracyclines & Related Hives    Metabolic Disorder Labs: No results found for: HGBA1C, MPG No results found for: PROLACTIN No results found for: CHOL, TRIG, HDL, CHOLHDL, VLDL, LDLCALC Lab Results  Component Value Date   TSH 2.360 05/03/2016    Therapeutic Level Labs: No results found for: LITHIUM No results found for: VALPROATE No components found for:  CBMZ  Current Medications: Current Outpatient Medications  Medication Sig Dispense  Refill  . buPROPion (WELLBUTRIN XL) 150 MG 24 hr tablet Take 300 mg daily and 150 mg daily (total of 450 mg) 90 tablet 0  . buPROPion (WELLBUTRIN XL) 300 MG 24 hr tablet Take 300 mg daily and 150 mg daily (total of 450 mg) 90 tablet 0  . FLUoxetine (PROZAC) 40 MG capsule Take 2 capsules (80 mg total) by mouth daily. 180 capsule 0  . lansoprazole (PREVACID) 15 MG capsule Take 15 mg by mouth daily at 12 noon.    Marland Kitchen LORazepam (ATIVAN) 1 MG tablet Take 1 tablet (1 mg total) by mouth daily as needed for anxiety. 30 tablet 0  . Lurasidone HCl 60 MG TABS Take 1 tablet (60 mg total) by mouth daily with breakfast. 90 tablet 0  . meloxicam (MOBIC) 15 MG tablet Take 1 tablet (15 mg total) daily by mouth. 30 tablet 2  . methocarbamol (ROBAXIN) 750 MG tablet TAKE 1 TABLET (750 MG TOTAL) BY MOUTH 4 (FOUR) TIMES DAILY AS NEEDED. 30 tablet 0  . metoprolol succinate (TOPROL-XL) 50 MG 24 hr tablet TAKE 1 TABLET BY MOUTH DAILY. TAKE WITH OR IMMEDIATELY FOLLOWING A MEAL. 90 tablet 1  . vitamin B-12 (CYANOCOBALAMIN) 1000 MCG tablet Take 5,000 mcg by mouth daily.     . Vitamin D, Cholecalciferol, 1000 UNITS TABS Take 5,000 Units by mouth daily.      No current facility-administered medications for this visit.      Musculoskeletal: Strength & Muscle Tone: within normal limits Gait & Station: normal Patient leans: N/A  Psychiatric Specialty Exam: Review of Systems  Psychiatric/Behavioral: Positive for depression. Negative for hallucinations, memory loss, substance abuse and suicidal ideas. The patient is nervous/anxious. The patient does not have insomnia.   All other systems reviewed and are negative.   Blood pressure 112/76, pulse 77, height 5\' 4"  (1.626 m), weight 228 lb (103.4 kg), SpO2 99 %.Body mass index is 39.14 kg/m.  General Appearance: Fairly Groomed  Eye Contact:  Good  Speech:  Clear and Coherent  Volume:  Normal  Mood:  "good"  Affect:  Appropriate, Congruent and slightly tense but reactive   Thought Process:  Coherent and Goal Directed  Orientation:  Full (Time, Place, and Person)  Thought Content: Logical Perceptions: denies AH/VH  Suicidal Thoughts:  No  Homicidal Thoughts:  No  Memory:  Immediate;   Fair Recent;   Fair Remote;   Fair  Judgement:  Good  Insight:  Fair  Psychomotor Activity:  Normal  Concentration:  Concentration: Fair and Attention Span: Fair  Recall:  Good  Fund of Knowledge: Good  Language: Good  Akathisia:  No  Handed:  Right  AIMS (if indicated): not done  Assets:  Communication Skills Desire for Improvement  ADL's:  Intact  Cognition: WNL  Sleep:  Good   Screenings: PHQ2-9     [facility]  PHQ-2 Total Score  6  3  6  4  5   PHQ-9 Total Score  18  13  19  14  14        Assessment and Plan:  Tracie James is a 46 y.o. year old female with a history of depression, who presents for follow up appointment for Major depressive disorder, recurrent episode, moderate (HCC) Psychosocial stressors including divorce in 2011 and discordance with her ex-husband over custody issues.   # MDD, moderate, recurrent without psychotic features There has been overall improvement in her mood symptoms, although she occasionally did have some episode of depression and anxiety in the setting of her daughter's at her ex-husband's place.  We will continue fluoxetine to target depression .  Will continue Wellbutrin and Latuda as adjunctive treatment for depression.  She has not taken Ativan since the last appointment.  Discussed cognitive diffusion.  She is encouraged to continue to see her therapist.   Plan I have reviewed and updated plans as below 1. Continue lurasidone 60 mg daily 2. Continue  Wellbutrin 450 mg daily 3. Continue fluoxetine 80 mg daily 4. Continue lorazepam 1 mg as needed for anxiety (she declines refill) 5. Return to clinic in three months for 15 mins  The patient demonstrates the following risk factors for suicide: Chronic risk factors for suicide include: psychiatric disorder of depressionand previous suicide attempts of overdosing medication. Acute risk factorsfor suicide include: family or marital conflict. Protective factorsfor this patient include: responsibility to others (children, family), coping skills and hope for the future. Patient is future oriented and agrees to come for next appointment.Considering these factors, the overall suicide risk at this point appears to be low. Patient isappropriate for outpatient follow      [name] Satin Boal, MD 06/13/2017, 4:05 PM

## 2017-06-13 NOTE — Patient Instructions (Addendum)
1. Continue lurasidone 60 mg daily 2. Continue Wellbutrin 450 mg daily 3. Continue fluoxetine 80 mg daily 4. Return to clinic in three months for 15 mins

## 2017-07-05 ENCOUNTER — Ambulatory Visit (INDEPENDENT_AMBULATORY_CARE_PROVIDER_SITE_OTHER): Payer: 59 | Admitting: Licensed Clinical Social Worker

## 2017-07-05 ENCOUNTER — Encounter (HOSPITAL_COMMUNITY): Payer: Self-pay | Admitting: Licensed Clinical Social Worker

## 2017-07-05 DIAGNOSIS — F331 Major depressive disorder, recurrent, moderate: Secondary | ICD-10-CM

## 2017-07-05 NOTE — Progress Notes (Signed)
   THERAPIST PROGRESS NOTE  Session Time: 3:00 pm-3:40 pm  Participation Level: Active  Behavioral Response: CasualAlertAnxious  Type of Therapy: Individual Therapy  Treatment Goals addressed: Anxiety and Coping  Interventions: CBT and Solution Focused  Summary: Tracie James is a 46 y.o. female who presents oriented x5 (person, place, situation, time and object), alert, overweight, average height, and cooperative to address mood. Patient has a minimal history of medical treatment including insomnia. Patient has a history of mental health treatment including medication management and outpatient therapy. Patient denies symptoms of mania. Patient denies suicidal and homicidal ideations. Patient denies psychosis including auditory and visual hallucinations. Patient denies substance abuse.   Patient had an average score of 5 out of 10 on the Outcome Rating Scale.  Patient shared her struggle with anxiety. She had to go to the [place] in [place] which triggered anxiety for her. She feels like people are looking at her and judging her. She stated that she looks at people and judges them so she feels like people are doing the same. After discussion, patient understood the cycle of anxiety (situation, though or belief triggers anxiety, avoid situation which relieves anxiety, situation arises again and anxiety increases). Patient understood that she has to build up a tolerance to situations that cause anxiety. She understood deep breathing and the change that can have on the body to reduce anxiety. Patient also understood that she needs to identify her own thoughts and beliefs about situations that trigger anxiety. Patient rated the session 8 out of 10 on the Session Rating Scale.  Patient engaged in session. Patient responded well to interventions. Patient continues to meet criteria for Major depressive disorder, recurrent episode, moderate. Patient will continue in outpatient therapy due to being the  least restrictive service to meet her needs. Patient made no progress on her goals at this time.   Suicidal/Homicidal: Negativewithout intent/plan  Therapist Response: Therapist reviewed patient's recent thoughts and behaviors. Therapist utilized CBT to address mood. Therapist processed patient's feelings to identify triggers for mood. Therapist discussed anxiety reduction techniques including deep breathing and identifying thoughts and beliefs. Therapist administered the Outcome Rating Scale and the Session Rating Scale.   Plan: Return again in 2-3 weeks.  Diagnosis: Axis I: Major depressive disorder, recurrent episode, moderate    Axis II: No diagnosis    Bynum BellowsJoshua Sharnice Bosler, LCSW 07/05/2017

## 2017-08-04 ENCOUNTER — Ambulatory Visit (INDEPENDENT_AMBULATORY_CARE_PROVIDER_SITE_OTHER): Payer: 59 | Admitting: Licensed Clinical Social Worker

## 2017-08-04 ENCOUNTER — Encounter (HOSPITAL_COMMUNITY): Payer: Self-pay | Admitting: Licensed Clinical Social Worker

## 2017-08-04 DIAGNOSIS — F331 Major depressive disorder, recurrent, moderate: Secondary | ICD-10-CM | POA: Diagnosis not present

## 2017-08-04 NOTE — Progress Notes (Signed)
   THERAPIST PROGRESS NOTE  Session Time: 3:00 pm-3:40 pm  Participation Level: Active  Behavioral Response: CasualAlertAnxious  Type of Therapy: Individual Therapy  Treatment Goals addressed: Anxiety and Coping  Interventions: CBT and Solution Focused  Summary: Tracie James is a 47 y.o. female who presents oriented x5 (person, place, situation, time and object), alert, overweight, average height, and cooperative to address mood. Patient has a minimal history of medical treatment including insomnia. Patient has a history of mental health treatment including medication management and outpatient therapy. Patient denies symptoms of mania. Patient denies suicidal and homicidal ideations. Patient denies psychosis including auditory and visual hallucinations. Patient denies substance abuse.   Physically:  Feeling well physically.  Spiritually/values: Doing well.  Relationship: No issues in her relationship.  Emotionally/mentally:  Patient continues to struggle with anxiety. She does not leave the house after getting back from work. She also noted depressive symptoms such as feeling empty and lack of motivation. Patient identified that she needs to finish painting her cabinets and wall in her kitchen. She said that she planned to get this done in July 2018 but has not finished due to lack of motivation. Patient identified the steps she needs to take to accomplish this task including ask for help gathering materials and scheduling it to hold herself accountable. Patient committed to work on touching up her cabinets between session. She understood that this is a low effort high payoff activity that will help with motivation, show that she is building things up in her head that stops her from accomplishing what she wants to do.    Patient engaged in session. Patient responded well to interventions. Patient continues to meet criteria for Major depressive disorder, recurrent episode, moderate. Patient  will continue in outpatient therapy due to being the least restrictive service to meet her needs. Patient made no progress on her goals at this time.   Suicidal/Homicidal: Negativewithout intent/plan  Therapist Response: Therapist reviewed patient's recent thoughts and behaviors. Therapist utilized CBT to address mood. Therapist processed patient's feelings to identify triggers for mood. Therapist discussed patient's motivation level. Therapist assisted patient in identifying a task at home she has been wanting to accomplish, identify the steps needed to accomplish the task and ways to accomplish the task.   Plan: Return again in 2-3 weeks.  Diagnosis: Axis I: Major depressive disorder, recurrent episode, moderate    Axis II: No diagnosis    Bynum BellowsJoshua Emaya Preston, LCSW 08/04/2017

## 2017-08-12 ENCOUNTER — Telehealth: Payer: Self-pay | Admitting: Unknown Physician Specialty

## 2017-08-12 MED ORDER — MELOXICAM 15 MG PO TABS: 15.0000 mg | ORAL_TABLET | Freq: Every day | ORAL | 2 refills | Status: DC

## 2017-08-12 NOTE — Telephone Encounter (Signed)
[facility]    Thank you

## 2017-08-12 NOTE — Telephone Encounter (Signed)
Patient needs refill on the following  meloxicam 15 mg 90 day supply  Faxed to [phone]  Thanks

## 2017-09-08 ENCOUNTER — Ambulatory Visit (HOSPITAL_COMMUNITY): Payer: 59 | Admitting: Licensed Clinical Social Worker

## 2017-09-12 NOTE — Progress Notes (Signed)
BH MD/PA/NP OP Progress Note  09/14/2017 5:07 PM Tracie James  MRN:  [id number]  Chief Complaint:  Chief Complaint    Follow-up; Depression     HPI:  Patient presents for follow-up appointment for depression.  She states that she has been doing well.  She had a visit from her children on Christmas and she enjoyed the time with them. She endorses significant anhedonia. She politely states that she is "lazy" an does not do any exercise and is not motivated to do it. Although she discussed with Mr. Pollyann Savoy that she would work on painting her kitchen, she has not done this as she is "lazy."  Although she used to enjoy doing scrap books, she has not done it as she lost her interest. She tends to do nothing at home when she is back from work. She takes care of her children.  She denies insomnia.  She feels depressed at times.  She has no energy.  She denies SI.  She feels anxious when she encountered her daughter having sex at home. She felt that she managed things well. She denies panic attacks. She denies ativan use.   Per PMP Lorazepam last filled on 04/06/2017 I have utilized the [place] Controlled Substances Reporting System (PMP AWARxE) to confirm adherence regarding the patient's medication. My review reveals appropriate prescription fills.   Visit Diagnosis:    ICD-10-CM   1. Major depressive disorder, recurrent episode, moderate (HCC) F33.1     Past Psychiatric History:  I have reviewed the patient's family history in detail and updated the patient record. Outpatient: Dx bipolar II disorder since 47 yo Psychiatry admission: SI in [place] for 3 days in 2010 (does not remember details) Previous suicide attempt: overdoing pills last in 7 years (did not seek medical care), denies SIB Past trials of medication: Cymbalta, Zoloft, Prozac, Lithium (sick), Depakote (spending money), Wellbutrin, Latuda, Abilify (did not work), Seroquel (weight gain, somnolence) Note:  She reports history of  spending money worth $1,000 buying a gift in 1990. She denies decreased need for sleep, euphoria, talkativeness. She occasionally has creative ideas but reports she has not put those into action before.   Past Medical History:  Past Medical History:  Diagnosis Date  . Anxiety   . Asthma   . Bipolar 2 disorder (HCC)   . Depression   . GERD (gastroesophageal reflux disease)   . Palpitations   . PVC (premature ventricular contraction)     Past Surgical History:  Procedure Laterality Date  . ABDOMINAL HYSTERECTOMY    . APPENDECTOMY      Family Psychiatric History: I have reviewed the patient's family history in detail and updated the patient record.  Family History:  Family History  Problem Relation Age of Onset  . Depression Mother   . Multiple sclerosis Mother   . Schizophrenia Maternal Grandmother   . Heart disease Maternal Grandmother        aortic valve replacement    Social History:  Social History   Socioeconomic History  . Marital status: Divorced    Spouse name: None  . Number of children: None  . Years of education: None  . Highest education level: None  Social Needs  . Financial resource strain: None  . Food insecurity - worry: None  . Food insecurity - inability: None  . Transportation needs - medical: None  . Transportation needs - non-medical: None  Occupational History  . None  Tobacco Use  . Smoking status: Former Smoker  Last attempt to quit: 03/31/2014    Years since quitting: 3.4  . Smokeless tobacco: Never Used  Substance and Sexual Activity  . Alcohol use: No  . Drug use: No  . Sexual activity: Yes    Partners: Male    Birth control/protection: Surgical  Other Topics Concern  . None  Social History Narrative  . 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  [place] in [place] after divorce to be with her family. She has two girls, age 49 and  7911.    Allergies:  Allergies  Allergen Reactions  . Erythromycin Hives  . Tetracyclines & Related Hives    Metabolic Disorder Labs: No results found for: HGBA1C, MPG No results found for: PROLACTIN No results found for: CHOL, TRIG, HDL, CHOLHDL, VLDL, LDLCALC Lab Results  Component Value Date   TSH 2.360 05/03/2016    Therapeutic Level Labs: No results found for: LITHIUM No results found for: VALPROATE No components found for:  CBMZ  Current Medications: Current Outpatient Medications  Medication Sig Dispense Refill  . buPROPion (WELLBUTRIN XL) 150 MG 24 hr tablet Take 300 mg daily and 150 mg daily (total of 450 mg) 90 tablet 0  . buPROPion (WELLBUTRIN XL) 300 MG 24 hr tablet Take 300 mg daily and 150 mg daily (total of 450 mg) 90 tablet 0  . FLUoxetine (PROZAC) 40 MG capsule Take 2 capsules (80 mg total) by mouth daily. 180 capsule 0  . lansoprazole (PREVACID) 15 MG capsule Take 15 mg by mouth daily at 12 noon.    Marland Kitchen. LORazepam (ATIVAN) 1 MG tablet Take 1 tablet (1 mg total) by mouth daily as needed for anxiety. 30 tablet 0  . Lurasidone HCl 60 MG TABS Take 1 tablet (60 mg total) by mouth daily with breakfast. 90 tablet 0  . meloxicam (MOBIC) 15 MG tablet Take 1 tablet (15 mg total) by mouth daily. 30 tablet 2  . methocarbamol (ROBAXIN) 750 MG tablet TAKE 1 TABLET (750 MG TOTAL) BY MOUTH 4 (FOUR) TIMES DAILY AS NEEDED. 30 tablet 0  . metoprolol succinate (TOPROL-XL) 50 MG 24 hr tablet TAKE 1 TABLET BY MOUTH DAILY. TAKE WITH OR IMMEDIATELY FOLLOWING A MEAL. 90 tablet 1  . vitamin B-12 (CYANOCOBALAMIN) 1000 MCG tablet Take 5,000 mcg by mouth daily.     . Vitamin D, Cholecalciferol, 1000 UNITS TABS Take 5,000 Units by mouth daily.      No current facility-administered medications for this visit.      Musculoskeletal: Strength & Muscle Tone: within normal limits Gait & Station: normal Patient leans: N/A  Psychiatric Specialty Exam: Review of Systems   Psychiatric/Behavioral: Positive for depression. Negative for hallucinations, memory loss, substance abuse and suicidal ideas. The patient is nervous/anxious. The patient does not have insomnia.   All other systems reviewed and are negative.   Blood pressure 122/77, pulse 71, height 5\' 4"  (1.626 m), weight 227 lb (103 kg), SpO2 99 %.Body mass index is 38.96 kg/m.  General Appearance: Fairly Groomed  Eye Contact:  Good  Speech:  Clear and Coherent  Volume:  Normal  Mood:  "fine"  Affect:  Appropriate, Congruent and slightly anxious  Thought Process:  Coherent and Goal Directed  Orientation:  Full (Time, Place, and Person)  Thought Content: Logical   Suicidal Thoughts:  No  Homicidal Thoughts:  No  Memory:  Immediate;  Good Recent;   Good Remote;   Good  Judgement:  Good  Insight:  Fair  Psychomotor Activity:  Normal  Concentration:  Concentration: Good and Attention Span: Good  Recall:  Good  Fund of Knowledge: Good  Language: Good  Akathisia:  No  Handed:  Right  AIMS (if indicated): not done  Assets:  Communication Skills Desire for Improvement  ADL's:  Intact  Cognition: WNL  Sleep:  Good   Screenings: PHQ2-9     Office Visit from 05/03/2016 in [facility] Office Visit from 04/07/2016 in [facility] Office Visit from 03/01/2016 in [facility] Office Visit from 01/21/2016 in [facility] Office Visit from 05/27/2015 in [facility]  PHQ-2 Total Score  6  3  6  4  5   PHQ-9 Total Score  18  13  19  14  14        Assessment and Plan:  Tracie James is a 47 y.o. year old female with a history of depression, who presents for follow up appointment for Major depressive disorder, recurrent episode, moderate (HCC) Psychosocial stressors including divorce in 2011and discordance with her ex-husband over custody issues.  # MDD, moderate, recurrent without psychotic features Patient endorses anhedonia since the last  appointment, although she reports improvement in her depressed mood.  Will continue fluoxetine and Wellbutrin for depression.  Will continue Latuda as adjunctive treatment for depression.  She has not taken Ativan since the last appointment.  Discussed cognitive diffusion.  Discussed behavioral activation.   Plan I have reviewed and updated plans as below 1. Continue lurasidone 60 mg daily 2. Continue Wellbutrin 450 mg daily 3. Continue fluoxetine 80 mg daily 4. Continue lorazepam 1 mg as needed for anxiety (she declines refill) 5. Return to clinic in three months for 15 mins  The patient demonstrates the following risk factors for suicide: Chronic risk factors for suicide include: psychiatric disorder of depressionand previous suicide attempts of overdosing medication. Acute risk factorsfor suicide include: family or marital conflict. Protective factorsfor this patient include: responsibility to others (children, family), coping skills and hope for the future. Patient is future oriented and agrees to come for next appointment.Considering these factors, the overall suicide risk at this point appears to be low. Patient isappropriate for outpatient follow   [name], MD 09/14/2017, 5:07 PM

## 2017-09-13 ENCOUNTER — Ambulatory Visit (HOSPITAL_COMMUNITY): Payer: 59 | Admitting: Psychiatry

## 2017-09-14 ENCOUNTER — Encounter (HOSPITAL_COMMUNITY): Payer: Self-pay | Admitting: Psychiatry

## 2017-09-14 ENCOUNTER — Ambulatory Visit (INDEPENDENT_AMBULATORY_CARE_PROVIDER_SITE_OTHER): Payer: 59 | Admitting: Psychiatry

## 2017-09-14 VITALS — BP 122/77 | HR 71 | Ht 64.0 in | Wt 227.0 lb

## 2017-09-14 DIAGNOSIS — F419 Anxiety disorder, unspecified: Secondary | ICD-10-CM | POA: Diagnosis not present

## 2017-09-14 DIAGNOSIS — R450 Nervousness: Secondary | ICD-10-CM

## 2017-09-14 DIAGNOSIS — Z87891 Personal history of nicotine dependence: Secondary | ICD-10-CM

## 2017-09-14 DIAGNOSIS — Z818 Family history of other mental and behavioral disorders: Secondary | ICD-10-CM | POA: Diagnosis not present

## 2017-09-14 DIAGNOSIS — F331 Major depressive disorder, recurrent, moderate: Secondary | ICD-10-CM

## 2017-09-14 MED ORDER — BUPROPION HCL ER (XL) 150 MG PO TB24: ORAL_TABLET | ORAL | 0 refills | Status: DC

## 2017-09-14 MED ORDER — LURASIDONE HCL 60 MG PO TABS: 60.0000 mg | ORAL_TABLET | Freq: Every day | ORAL | 0 refills | Status: DC

## 2017-09-14 MED ORDER — BUPROPION HCL ER (XL) 300 MG PO TB24: ORAL_TABLET | ORAL | 0 refills | Status: DC

## 2017-09-14 MED ORDER — FLUOXETINE HCL 40 MG PO CAPS: 80.0000 mg | ORAL_CAPSULE | Freq: Every day | ORAL | 0 refills | Status: DC

## 2017-09-14 NOTE — Patient Instructions (Signed)
1. Continue lurasidone 60 mg daily 2. Continue Wellbutrin 450 mg daily 3. Continue fluoxetine 80 mg daily 4. Continue lorazepam 1 mg as needed for anxiety (she declines refill) 5. Return to clinic in three months for 15 mins

## 2017-10-10 ENCOUNTER — Encounter: Payer: Self-pay | Admitting: Family Medicine

## 2017-10-10 ENCOUNTER — Ambulatory Visit (INDEPENDENT_AMBULATORY_CARE_PROVIDER_SITE_OTHER): Payer: 59 | Admitting: Family Medicine

## 2017-10-10 VITALS — BP 99/67 | HR 78 | Temp 97.7°F | Ht 64.0 in | Wt 229.6 lb

## 2017-10-10 DIAGNOSIS — L309 Dermatitis, unspecified: Secondary | ICD-10-CM | POA: Diagnosis not present

## 2017-10-10 MED ORDER — CLOBETASOL PROPIONATE 0.05 % EX SHAM: 1.0000 "application " | MEDICATED_SHAMPOO | Freq: Every day | CUTANEOUS | 2 refills | Status: DC | PRN

## 2017-10-10 MED ORDER — HYDROXYZINE HCL 25 MG PO TABS: 25.0000 mg | ORAL_TABLET | Freq: Three times a day (TID) | ORAL | 0 refills | Status: DC | PRN

## 2017-10-10 NOTE — Progress Notes (Signed)
BP 99/67 (BP Location: Right Arm, Patient Position: Sitting, Cuff Size: Large)   Pulse 78   Temp 97.7 F (36.5 C) (Oral)   Ht 5\' 4"  (1.626 m)   Wt 229 lb 9.6 oz (104.1 kg)   LMP  (LMP Unknown)   SpO2 99%   BMI 39.41 kg/m    Subjective:    Patient ID: Tracie James, female    DOB: 07-Jun-1971, 47 y.o.   MRN: [id number]  HPI: Tracie James is a 47 y.o. female  Chief Complaint  Patient presents with  . Itchy Scalp    Ongoing 3 months. Tried multiple shampoos, baking soda, olive oil. Boyfriend checked for lice and there wasn't any there. Itching 10/10.   Pt here today with several months of severely itchy scalp. Seemed to start when she switched shampoos around that time, but has since resumed prior shampoo with no resolution. No flaking, scabbing, hair loss, but some redness diffusely. Tried baking soda, olive oil, tea tree oils, new shampoos, original shampoo, OTC itch creams all with no relief.   Relevant past medical, surgical, family and social history reviewed and updated as indicated. Interim medical history since our last visit reviewed. Allergies and medications reviewed and updated.  Review of Systems  Per HPI unless specifically indicated above     Objective:    BP 99/67 (BP Location: Right Arm, Patient Position: Sitting, Cuff Size: Large)   Pulse 78   Temp 97.7 F (36.5 C) (Oral)   Ht 5\' 4"  (1.626 m)   Wt 229 lb 9.6 oz (104.1 kg)   LMP  (LMP Unknown)   SpO2 99%   BMI 39.41 kg/m   Wt Readings from Last 3 Encounters:  10/10/17 229 lb 9.6 oz (104.1 kg)  10/20/16 230 lb 9.6 oz (104.6 kg)  05/03/16 238 lb (108 kg)    Physical Exam  Constitutional: She appears well-developed and well-nourished. No distress.  HENT:  Head: Atraumatic.  Eyes: Pupils are equal, round, and reactive to light. Conjunctivae are normal.  Neck: Normal range of motion. Neck supple.  Cardiovascular: Normal rate and normal heart sounds.  Pulmonary/Chest: Effort normal and breath  sounds normal. No respiratory distress.  Musculoskeletal: Normal range of motion.  Neurological: She is alert.  Skin: Skin is warm and dry. There is erythema (patchy erythematous rash across scalp).  No plaques or scaling present  Psychiatric: She has a normal mood and affect. Her behavior is normal.  Nursing note and vitals reviewed.  Results for orders placed or performed in visit on 05/03/16  CBC with Differential/Platelet  Result Value Ref Range   WBC 10.8 3.4 - 10.8 x10E3/uL   RBC 4.23 3.77 - 5.28 x10E6/uL   Hemoglobin 12.3 11.1 - 15.9 g/dL   Hematocrit 08.6 57.8 - 46.6 %   MCV 85 79 - 97 fL   MCH 29.1 26.6 - 33.0 pg   MCHC 34.2 31.5 - 35.7 g/dL   RDW 46.9 62.9 - 52.8 %   Platelets 242 150 - 379 x10E3/uL   Neutrophils 73 Not Estab. %   Lymphs 20 Not Estab. %   Monocytes 5 Not Estab. %   Eos 2 Not Estab. %   Basos 0 Not Estab. %   Neutrophils Absolute 7.8 (H) 1.4 - 7.0 x10E3/uL   Lymphocytes Absolute 2.2 0.7 - 3.1 x10E3/uL   Monocytes Absolute 0.5 0.1 - 0.9 x10E3/uL   EOS (ABSOLUTE) 0.3 0.0 - 0.4 x10E3/uL   Basophils Absolute 0.0 0.0 - 0.2 x10E3/uL  Immature Granulocytes 0 Not Estab. %   Immature Grans (Abs) 0.0 0.0 - 0.1 x10E3/uL  TSH  Result Value Ref Range   TSH 2.360 0.450 - 4.500 uIU/mL  VITAMIN D 25 Hydroxy (Vit-D Deficiency, Fractures)  Result Value Ref Range   Vit D, 25-Hydroxy 47.1 30.0 - 100.0 ng/mL  Comprehensive metabolic panel  Result Value Ref Range   Glucose 86 65 - 99 mg/dL   BUN 12 6 - 24 mg/dL   Creatinine, Ser 1.610.87 0.57 - 1.00 mg/dL   GFR calc non Af Amer 80 >59 mL/min/1.73   GFR calc Af Amer 92 >59 mL/min/1.73   BUN/Creatinine Ratio 14 9 - 23   Sodium 138 134 - 144 mmol/L   Potassium 4.3 3.5 - 5.2 mmol/L   Chloride 99 96 - 106 mmol/L   CO2 25 18 - 29 mmol/L   Calcium 8.8 8.7 - 10.2 mg/dL   Total Protein 6.5 6.0 - 8.5 g/dL   Albumin 4.1 3.5 - 5.5 g/dL   Globulin, Total 2.4 1.5 - 4.5 g/dL   Albumin/Globulin Ratio 1.7 1.2 - 2.2   Bilirubin  Total 0.2 0.0 - 1.2 mg/dL   Alkaline Phosphatase 86 39 - 117 IU/L   AST 22 0 - 40 IU/L   ALT 22 0 - 32 IU/L      Assessment & Plan:   Problem List Items Addressed This Visit    None    Visit Diagnoses    Dermatitis    -  Primary   Will tx with clobetasol shampoo prn, unscented products, infrequent washing, hydroxyzine prn. F/u if no improvement       Follow up plan: Return if symptoms worsen or fail to improve.

## 2017-10-12 ENCOUNTER — Ambulatory Visit (HOSPITAL_COMMUNITY): Payer: Self-pay | Admitting: Licensed Clinical Social Worker

## 2017-10-13 NOTE — Patient Instructions (Signed)
Follow up as needed

## 2017-11-02 ENCOUNTER — Other Ambulatory Visit: Payer: Self-pay | Admitting: Unknown Physician Specialty

## 2017-11-02 NOTE — Telephone Encounter (Signed)
Your patient 

## 2017-11-14 ENCOUNTER — Ambulatory Visit (HOSPITAL_COMMUNITY): Payer: 59 | Admitting: Licensed Clinical Social Worker

## 2017-12-04 ENCOUNTER — Other Ambulatory Visit: Payer: Self-pay | Admitting: Unknown Physician Specialty

## 2017-12-05 NOTE — Telephone Encounter (Signed)
Please advise 

## 2017-12-05 NOTE — Telephone Encounter (Signed)
Metoprolol 50 mg 24 hr refill request  LOV 04/07/16 with Gabriel Cirri.   Been a couple of acute care visits but none where the metoprolol was addressed.  [facility] [address] [place] - [address]
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
## 2017-12-13 ENCOUNTER — Ambulatory Visit (INDEPENDENT_AMBULATORY_CARE_PROVIDER_SITE_OTHER): Payer: 59 | Admitting: Psychiatry

## 2017-12-13 ENCOUNTER — Encounter (HOSPITAL_COMMUNITY): Payer: Self-pay | Admitting: Psychiatry

## 2017-12-13 VITALS — BP 121/81 | HR 77 | Ht 64.0 in | Wt 235.0 lb

## 2017-12-13 DIAGNOSIS — Z653 Problems related to other legal circumstances: Secondary | ICD-10-CM

## 2017-12-13 DIAGNOSIS — R450 Nervousness: Secondary | ICD-10-CM

## 2017-12-13 DIAGNOSIS — Z87891 Personal history of nicotine dependence: Secondary | ICD-10-CM | POA: Diagnosis not present

## 2017-12-13 DIAGNOSIS — Z818 Family history of other mental and behavioral disorders: Secondary | ICD-10-CM

## 2017-12-13 DIAGNOSIS — F419 Anxiety disorder, unspecified: Secondary | ICD-10-CM | POA: Diagnosis not present

## 2017-12-13 DIAGNOSIS — F331 Major depressive disorder, recurrent, moderate: Secondary | ICD-10-CM | POA: Diagnosis not present

## 2017-12-13 MED ORDER — BUPROPION HCL ER (XL) 300 MG PO TB24: ORAL_TABLET | ORAL | 0 refills | Status: DC

## 2017-12-13 MED ORDER — LURASIDONE HCL 60 MG PO TABS: 60.0000 mg | ORAL_TABLET | Freq: Every day | ORAL | 0 refills | Status: DC

## 2017-12-13 MED ORDER — BUPROPION HCL ER (XL) 150 MG PO TB24: ORAL_TABLET | ORAL | 0 refills | Status: DC

## 2017-12-13 MED ORDER — FLUOXETINE HCL 40 MG PO CAPS: 80.0000 mg | ORAL_CAPSULE | Freq: Every day | ORAL | 0 refills | Status: DC

## 2017-12-13 NOTE — Patient Instructions (Signed)
1. Continue lurasidone 60 mg daily 2. Continue Wellbutrin 450 mg daily 3. Continue fluoxetine 80 mg daily 4 . Continue lorazepam 1 mg as needed for anxiety  5. Return to clinic in three months for 15 mins 5. Contact [facility] Office: [phone] [address] [place], [place] [address]

## 2018-01-23 ENCOUNTER — Other Ambulatory Visit (HOSPITAL_COMMUNITY): Payer: Self-pay | Admitting: Psychiatry

## 2018-01-23 MED ORDER — LORAZEPAM 1 MG PO TABS: 1.0000 mg | ORAL_TABLET | Freq: Every day | ORAL | 0 refills | Status: DC | PRN

## 2018-01-30 ENCOUNTER — Other Ambulatory Visit: Payer: Self-pay | Admitting: Unknown Physician Specialty

## 2018-01-30 NOTE — Telephone Encounter (Signed)
Meloxicam refill Last OV:10/20/16 Last refill:11/02/17 30 tab/2 refills ZOX:[id number]:[id number] - [place], [place] - [address] [facility] [phone] (Phone) [phone] (Fax)

## 2018-03-13 NOTE — Progress Notes (Signed)
BH MD/PA/NP OP Progress Note  03/16/2018 4:18 PM Tracie James  MRN:  [id number]  Chief Complaint:  Chief Complaint    Depression; Follow-up     HPI:  Patient presents for follow-up appointment for depression.  She states that she will have upcoming court to battle for custody issues with her ex-husband in [place].  Although she feels anxious about it, she feels ready as she does not find any reason for her ex-husband to gain custody.  Her younger daughter came back from his place.  Her older daughter will go to [employer] for college.  She has fair sleep.  She denies feeling depressed.  She has fair concentration and appetite.  She denies SI.  She feels anxious and tense at times.  She denies panic attacks.  She rarely takes Ativan for anxiety.   Wt Readings from Last 3 Encounters:  03/16/18 236 lb (107 kg)  12/13/17 235 lb (106.6 kg)  10/10/17 229 lb 9.6 oz (104.1 kg)    Visit Diagnosis:    ICD-10-CM   1. Major depressive disorder, recurrent episode, moderate (HCC) F33.1     Past Psychiatric History: Please see initial evaluation for full details. I have reviewed the history. No updates at this time.     Past Medical History:  Past Medical History:  Diagnosis Date  . Anxiety   . Asthma   . Bipolar 2 disorder (HCC)   . Depression   . GERD (gastroesophageal reflux disease)   . Palpitations   . PVC (premature ventricular contraction)     Past Surgical History:  Procedure Laterality Date  . ABDOMINAL HYSTERECTOMY    . APPENDECTOMY      Family Psychiatric History: Please see initial evaluation for full details. I have reviewed the history. No updates at this time.     Family History:  Family History  Problem Relation Age of Onset  . Depression Mother   . Multiple sclerosis Mother   . Schizophrenia Maternal Grandmother   . Heart disease Maternal Grandmother        aortic valve replacement    Social History:  Social History   Socioeconomic History  .  Marital status: Divorced    Spouse name: Not on file  . Number of children: Not on file  . Years of education: Not on file  . Highest education level: Not on file  Occupational History  . Not on file  Social Needs  . Financial resource strain: Not on file  . Food insecurity:    Worry: Not on file    Inability: Not on file  . Transportation needs:    Medical: Not on file    Non-medical: Not on file  Tobacco Use  . Smoking status: Former Smoker    Last attempt to quit: 03/31/2014    Years since quitting: 3.9  . Smokeless tobacco: Never Used  Substance and Sexual Activity  . Alcohol use: No  . Drug use: No  . Sexual activity: Yes    Partners: Male    Birth control/protection: Surgical  Lifestyle  . Physical activity:    Days per week: Not on file    Minutes per session: Not on file  . Stress: Not on file  Relationships  . Social connections:    Talks on phone: Not on file    Gets together: Not on file    Attends religious service: Not on file    Active member of club or organization: Not on file    Attends  meetings of clubs or organizations: Not on file    Relationship status: Not on file  Other Topics Concern  . Not on file  Social History Narrative  . Not on file    Allergies:  Allergies  Allergen Reactions  . Erythromycin Hives  . Tetracyclines & Related Hives    Metabolic Disorder Labs: No results found for: HGBA1C, MPG No results found for: PROLACTIN No results found for: CHOL, TRIG, HDL, CHOLHDL, VLDL, LDLCALC Lab Results  Component Value Date   TSH 2.360 05/03/2016    Therapeutic Level Labs: No results found for: LITHIUM No results found for: VALPROATE No components found for:  CBMZ  Current Medications: Current Outpatient Medications  Medication Sig Dispense Refill  . buPROPion (WELLBUTRIN XL) 150 MG 24 hr tablet Take 300 mg daily and 150 mg daily (total of 450 mg) 90 tablet 0  . buPROPion (WELLBUTRIN XL) 300 MG 24 hr tablet Take 300 mg daily  and 150 mg daily (total of 450 mg) 90 tablet 0  . FLUoxetine (PROZAC) 40 MG capsule Take 2 capsules (80 mg total) by mouth daily. 180 capsule 0  . lansoprazole (PREVACID) 15 MG capsule Take 15 mg by mouth daily at 12 noon.    Marland Kitchen. LORazepam (ATIVAN) 1 MG tablet Take 1 tablet (1 mg total) by mouth daily as needed for anxiety. 30 tablet 0  . Lurasidone HCl 60 MG TABS Take 1 tablet (60 mg total) by mouth daily with breakfast. 90 tablet 0  . meloxicam (MOBIC) 15 MG tablet TAKE 1 TABLET BY MOUTH EVERY DAY 90 tablet 0  . metoprolol succinate (TOPROL-XL) 50 MG 24 hr tablet TAKE 1 TABLET BY MOUTH DAILY. TAKE WITH OR IMMEDIATELY FOLLOWING A MEAL. 90 tablet 1  . vitamin B-12 (CYANOCOBALAMIN) 1000 MCG tablet Take 5,000 mcg by mouth daily.     . Vitamin D, Cholecalciferol, 1000 UNITS TABS Take 5,000 Units by mouth daily.     . Clobetasol Propionate 0.05 % shampoo Apply 1 application topically daily as needed. (Patient not taking: Reported on 03/16/2018) 118 mL 2  . methocarbamol (ROBAXIN) 750 MG tablet TAKE 1 TABLET (750 MG TOTAL) BY MOUTH 4 (FOUR) TIMES DAILY AS NEEDED. (Patient not taking: Reported on 03/16/2018) 30 tablet 0   No current facility-administered medications for this visit.      Musculoskeletal: Strength & Muscle Tone: within normal limits Gait & Station: normal Patient leans: N/A  Psychiatric Specialty Exam: Review of Systems  Psychiatric/Behavioral: Negative for depression, hallucinations, memory loss, substance abuse and suicidal ideas. The patient is nervous/anxious. The patient does not have insomnia.   All other systems reviewed and are negative.   Blood pressure 120/83, pulse 80, height 5' 2.75" (1.594 m), weight 236 lb (107 kg), SpO2 99 %.Body mass index is 42.14 kg/m.  General Appearance: Fairly Groomed  Eye Contact:  Good  Speech:  Clear and Coherent  Volume:  Normal  Mood:  "fine"  Affect:  Appropriate, Congruent and euthymic, reactive  Thought Process:  Coherent   Orientation:  Full (Time, Place, and Person)  Thought Content: Logical   Suicidal Thoughts:  No  Homicidal Thoughts:  No  Memory:  Immediate;   Good  Judgement:  Good  Insight:  Fair  Psychomotor Activity:  Normal  Concentration:  Concentration: Good and Attention Span: Good  Recall:  Good  Fund of Knowledge: Good  Language: Good  Akathisia:  No  Handed:  Right  AIMS (if indicated): not done  Assets:  Communication Skills  Desire for Improvement  ADL's:  Intact  Cognition: WNL  Sleep:  Fair   Screenings: PHQ2-9     Office Visit from 05/03/2016 in [facility] Office Visit from 04/07/2016 in [facility] Office Visit from 03/01/2016 in [facility] Office Visit from 01/21/2016 in [facility] Office Visit from 05/27/2015 in [facility]  PHQ-2 Total Score  6  3  6  4  5   PHQ-9 Total Score  18  13  19  14  14        Assessment and Plan:  Adaya Garmany is a 47 y.o. year old female with a history of depression , who presents for follow up appointment for Major depressive disorder, recurrent episode, moderate (HCC)  # MDD, moderate, recurrent without psychotic features Patient reports overall improvement in her mood symptoms since her last appointment.  Psychosocial stressors including upcoming court for custody issues with her ex-husband.  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  Will continue fluoxetine and Wellbutrin for depression.  Will continue Latuda as adjunctive treatment for depression.  Will continue Ativan as needed for anxiety.  Discussed risk of oversedation and dependence.   Plan 1. Continue lurasidone 60 mg daily 2. Continue Wellbutrin 450 mg daily 3. Continue fluoxetine 80 mg daily 4 . Continue lorazepam 1 mg as needed for anxiety (she declines refill) 5. Return to clinic in three months for 15 mins  The patient demonstrates the  following risk factors for suicide: Chronic risk factors for suicide include: psychiatric disorder of depressionand previous suicide attempts of overdosing medication. Acute risk factorsfor suicide include: family or marital conflict. Protective factorsfor this patient include: responsibility to others (children, family), coping skills and hope for the future. Patient is future oriented and agrees to come for next appointment.Considering these factors, the overall suicide risk at this point appears to be low. Patient isappropriate for outpatient follow    [name], MD 03/16/2018, 4:18 PM

## 2018-03-15 ENCOUNTER — Ambulatory Visit (HOSPITAL_COMMUNITY): Payer: Self-pay | Admitting: Psychiatry

## 2018-03-16 ENCOUNTER — Encounter (HOSPITAL_COMMUNITY): Payer: Self-pay | Admitting: Psychiatry

## 2018-03-16 ENCOUNTER — Ambulatory Visit (INDEPENDENT_AMBULATORY_CARE_PROVIDER_SITE_OTHER): Payer: 59 | Admitting: Psychiatry

## 2018-03-16 VITALS — BP 120/83 | HR 80 | Ht 62.75 in | Wt 236.0 lb

## 2018-03-16 DIAGNOSIS — F331 Major depressive disorder, recurrent, moderate: Secondary | ICD-10-CM | POA: Diagnosis not present

## 2018-03-16 MED ORDER — FLUOXETINE HCL 40 MG PO CAPS: 80.0000 mg | ORAL_CAPSULE | Freq: Every day | ORAL | 0 refills | Status: DC

## 2018-03-16 MED ORDER — LURASIDONE HCL 60 MG PO TABS: 60.0000 mg | ORAL_TABLET | Freq: Every day | ORAL | 0 refills | Status: DC

## 2018-03-16 MED ORDER — BUPROPION HCL ER (XL) 150 MG PO TB24: ORAL_TABLET | ORAL | 0 refills | Status: DC

## 2018-03-16 MED ORDER — BUPROPION HCL ER (XL) 300 MG PO TB24: ORAL_TABLET | ORAL | 0 refills | Status: DC

## 2018-03-16 NOTE — Patient Instructions (Signed)
1. Continue lurasidone 60 mg daily 2. Continue Wellbutrin 450 mg daily 3. Continue fluoxetine 80 mg daily 4 .Continue lorazepam 1 mg as needed for anxiety 5.Return to clinic inthree months for 15 mins 

## 2018-04-27 ENCOUNTER — Other Ambulatory Visit: Payer: Self-pay | Admitting: Unknown Physician Specialty

## 2018-04-27 NOTE — Telephone Encounter (Signed)
Requested medication (s) are due for refill today: Yes   Requested medication (s) are on the active medication list: Yes  Last refill:  01/30/18  Future visit scheduled: No  Notes to clinic:  See request    Requested Prescriptions  Pending Prescriptions Disp Refills   meloxicam (MOBIC) 15 MG tablet [Pharmacy Med Name: MELOXICAM 15 MG TABLET] 90 tablet 0    Sig: TAKE 1 TABLET BY MOUTH EVERY DAY     Analgesics:  COX2 Inhibitors Failed - 04/27/2018  1:51 AM      Failed - HGB in normal range and within 360 days    Hemoglobin  Date Value Ref Range Status  05/03/2016 12.3 11.1 - 15.9 g/dL Final         Failed - Cr in normal range and within 360 days    Creatinine, Ser  Date Value Ref Range Status  05/03/2016 0.87 0.57 - 1.00 mg/dL Final         Passed - Patient is not pregnant      Passed - Valid encounter within last 12 months    Recent Outpatient Visits          6 months ago Dermatitis   [facility] Particia Nearing, PA-C   1 year ago Left shoulder pain, unspecified chronicity   [facility] Gabriel Cirri, NP   1 year ago Bipolar 2 disorder [facility])   [facility] Particia Nearing, [place]   2 years ago Bipolar 2 disorder [facility])   [facility] Gabriel Cirri, NP   2 years ago Tendonitis of left rotator cuff   [facility] Roosvelt Maser [place], [place]

## 2018-05-09 ENCOUNTER — Ambulatory Visit (INDEPENDENT_AMBULATORY_CARE_PROVIDER_SITE_OTHER): Payer: 59 | Admitting: Nurse Practitioner

## 2018-05-09 ENCOUNTER — Encounter: Payer: Self-pay | Admitting: Nurse Practitioner

## 2018-05-09 DIAGNOSIS — G894 Chronic pain syndrome: Secondary | ICD-10-CM

## 2018-05-09 MED ORDER — MELOXICAM 15 MG PO TABS: 15.0000 mg | ORAL_TABLET | Freq: Every day | ORAL | 3 refills | Status: DC

## 2018-05-09 NOTE — Progress Notes (Signed)
BP 130/82 (BP Location: Left Arm, Patient Position: Sitting, Cuff Size: Large)   Temp 98 F (36.7 C)   Ht 5\' 3"  (1.6 m)   Wt 232 lb 4 oz (105.3 kg)   LMP  (LMP Unknown)   SpO2 99%   BMI 41.14 kg/m    Subjective:    Patient ID: Tracie James, female    DOB: January 15, 1971, 47 y.o.   MRN: [id number]  HPI: Tracie James is a 47 y.o. female presents for medication refill of Meloxicam for her chronic pain.  Chief Complaint  Patient presents with  . Hip Pain    bilateral  . Shoulder Pain    bilateral   Shoulder and Hip Pain: Chronic, ongoing.  Has been chronic issue since 2017.  She has been followed by PT and ortho in past.  Currently takes Meloxicam every day with no ADR and reports adequate relief with this medication.  States she is able to function at work on daily basis and perform all ADL's without difficulty.  Reports she had lab work done today at her workplace (CBC, CMP, Lipid panel, TSH, A1C, and Vitamin D).  She is going to send these lab reports to provider once resulted.    Relevant past medical, surgical, family and social history reviewed and updated as indicated. Interim medical history since our last visit reviewed. Allergies and medications reviewed and updated.  Review of Systems  Constitutional: Negative for activity change, appetite change and fatigue.  Respiratory: Negative for cough, chest tightness and shortness of breath.   Cardiovascular: Negative for chest pain, palpitations and leg swelling.  Gastrointestinal: Negative for abdominal distention and abdominal pain.  Musculoskeletal: Negative for back pain, gait problem, joint swelling and neck pain.  Neurological: Negative for dizziness, numbness and headaches.    Per HPI unless specifically indicated above     Objective:    BP 130/82 (BP Location: Left Arm, Patient Position: Sitting, Cuff Size: Large)   Temp 98 F (36.7 C)   Ht 5\' 3"  (1.6 m)   Wt 232 lb 4 oz (105.3 kg)   LMP  (LMP Unknown)    SpO2 99%   BMI 41.14 kg/m   Wt Readings from Last 3 Encounters:  05/09/18 232 lb 4 oz (105.3 kg)  03/16/18 236 lb (107 kg)  12/13/17 235 lb (106.6 kg)    Physical Exam  Constitutional: She is oriented to person, place, and time. She appears well-developed and well-nourished.  HENT:  Head: Normocephalic and atraumatic.  Eyes: Pupils are equal, round, and reactive to light. Conjunctivae are normal.  Neck: Normal range of motion. Neck supple.  Cardiovascular: Normal rate, regular rhythm and normal heart sounds.  Pulmonary/Chest: Effort normal and breath sounds normal.  Abdominal: Soft. Bowel sounds are normal.  Musculoskeletal: Normal range of motion.  Neurological: She is alert and oriented to person, place, and time.  Skin: Skin is warm and dry.  Psychiatric: She has a normal mood and affect. Her behavior is normal. Judgment and thought content normal.    Results for orders placed or performed in visit on 05/03/16  CBC with Differential/Platelet  Result Value Ref Range   WBC 10.8 3.4 - 10.8 x10E3/uL   RBC 4.23 3.77 - 5.28 x10E6/uL   Hemoglobin 12.3 11.1 - 15.9 g/dL   Hematocrit 82.9 56.2 - 46.6 %   MCV 85 79 - 97 fL   MCH 29.1 26.6 - 33.0 pg   MCHC 34.2 31.5 - 35.7 g/dL   RDW 13.0 86.5 -  15.4 %   Platelets 242 150 - 379 x10E3/uL   Neutrophils 73 Not Estab. %   Lymphs 20 Not Estab. %   Monocytes 5 Not Estab. %   Eos 2 Not Estab. %   Basos 0 Not Estab. %   Neutrophils Absolute 7.8 (H) 1.4 - 7.0 x10E3/uL   Lymphocytes Absolute 2.2 0.7 - 3.1 x10E3/uL   Monocytes Absolute 0.5 0.1 - 0.9 x10E3/uL   EOS (ABSOLUTE) 0.3 0.0 - 0.4 x10E3/uL   Basophils Absolute 0.0 0.0 - 0.2 x10E3/uL   Immature Granulocytes 0 Not Estab. %   Immature Grans (Abs) 0.0 0.0 - 0.1 x10E3/uL  TSH  Result Value Ref Range   TSH 2.360 0.450 - 4.500 uIU/mL  VITAMIN D 25 Hydroxy (Vit-D Deficiency, Fractures)  Result Value Ref Range   Vit D, 25-Hydroxy 47.1 30.0 - 100.0 ng/mL  Comprehensive metabolic panel    Result Value Ref Range   Glucose 86 65 - 99 mg/dL   BUN 12 6 - 24 mg/dL   Creatinine, Ser 1.61 0.57 - 1.00 mg/dL   GFR calc non Af Amer 80 >59 mL/min/1.73   GFR calc Af Amer 92 >59 mL/min/1.73   BUN/Creatinine Ratio 14 9 - 23   Sodium 138 134 - 144 mmol/L   Potassium 4.3 3.5 - 5.2 mmol/L   Chloride 99 96 - 106 mmol/L   CO2 25 18 - 29 mmol/L   Calcium 8.8 8.7 - 10.2 mg/dL   Total Protein 6.5 6.0 - 8.5 g/dL   Albumin 4.1 3.5 - 5.5 g/dL   Globulin, Total 2.4 1.5 - 4.5 g/dL   Albumin/Globulin Ratio 1.7 1.2 - 2.2   Bilirubin Total 0.2 0.0 - 1.2 mg/dL   Alkaline Phosphatase 86 39 - 117 IU/L   AST 22 0 - 40 IU/L   ALT 22 0 - 32 IU/L      Assessment & Plan:   Problem List Items Addressed This Visit      Other   Chronic pain syndrome    Chronic, stable on Meloxicam daily.  Pain bilateral hips and shoulders.  Will continue current regimen.  Refill sent.  Labs performed at her workplace today and she is to send these to provider once resulted.          Follow up plan: Return if symptoms worsen or fail to improve.

## 2018-05-09 NOTE — Patient Instructions (Signed)

## 2018-05-09 NOTE — Assessment & Plan Note (Signed)
Chronic, stable on Meloxicam daily.  Pain bilateral hips and shoulders.  Will continue current regimen.  Refill sent.  Labs performed at her workplace today and she is to send these to provider once resulted.

## 2018-06-05 ENCOUNTER — Other Ambulatory Visit: Payer: Self-pay | Admitting: Unknown Physician Specialty

## 2018-06-06 ENCOUNTER — Other Ambulatory Visit: Payer: Self-pay | Admitting: *Deleted

## 2018-06-06 MED ORDER — METOPROLOL SUCCINATE ER 50 MG PO TB24: ORAL_TABLET | ORAL | 1 refills | Status: DC

## 2018-06-14 NOTE — Progress Notes (Signed)
BH MD/PA/NP OP Progress Note  06/19/2018 3:20 PM Tracie James  MRN:  [id number]  Chief Complaint:  Chief Complaint    Follow-up; Depression     HPI:  Patient presents for follow up appointment for depression. She states that she won her daughter's custody. She felt wonderful, although she also feels that it was like a "long strenuous drive." She feels exhausted, and complains of myalgia for the past two weeks. She feels that she feels the same way when she came to the office for the first time. She hopes to stay on the same medication as it has worked over the past few years. She reports good relationship with her daughter. The patient goes to archery practice with her daughter. Her mother is doing well. She denies insomnia. She feels fatigue. She has mild anhedonia. She has good appetite. She has fair concentration. She denies SI. She denies anxiety. She has not taken ativan for the past few weeks.  Visit Diagnosis:    ICD-10-CM   1. MDD (major depressive disorder), recurrent episode, mild (HCC) F33.0     Past Psychiatric History: Please see initial evaluation for full details. I have reviewed the history. No updates at this time.     Past Medical History:  Past Medical History:  Diagnosis Date  . Anxiety   . Asthma   . Bipolar 2 disorder (HCC)   . Depression   . GERD (gastroesophageal reflux disease)   . Palpitations   . PVC (premature ventricular contraction)     Past Surgical History:  Procedure Laterality Date  . ABDOMINAL HYSTERECTOMY    . APPENDECTOMY      Family Psychiatric History: Please see initial evaluation for full details. I have reviewed the history. No updates at this time.     Family History:  Family History  Problem Relation Age of Onset  . Depression Mother   . Multiple sclerosis Mother   . Schizophrenia Maternal Grandmother   . Heart disease Maternal Grandmother        aortic valve replacement    Social History:  Social History    Socioeconomic History  . Marital status: Divorced    Spouse name: Not on file  . Number of children: Not on file  . Years of education: Not on file  . Highest education level: Not on file  Occupational History  . Not on file  Social Needs  . Financial resource strain: Not on file  . Food insecurity:    Worry: Not on file    Inability: Not on file  . Transportation needs:    Medical: Not on file    Non-medical: Not on file  Tobacco Use  . Smoking status: Former Smoker    Last attempt to quit: 03/31/2014    Years since quitting: 4.2  . Smokeless tobacco: Never Used  Substance and Sexual Activity  . Alcohol use: No  . Drug use: No  . Sexual activity: Yes    Partners: Male    Birth control/protection: Surgical  Lifestyle  . Physical activity:    Days per week: Not on file    Minutes per session: Not on file  . Stress: Not on file  Relationships  . Social connections:    Talks on phone: Not on file    Gets together: Not on file    Attends religious service: Not on file    Active member of club or organization: Not on file    Attends meetings of clubs or organizations:  Not on file    Relationship status: Not on file  Other Topics Concern  . Not on file  Social History Narrative  . Not on file    Allergies:  Allergies  Allergen Reactions  . Erythromycin Hives  . Tetracyclines & Related Hives    Metabolic Disorder Labs: No results found for: HGBA1C, MPG No results found for: PROLACTIN No results found for: CHOL, TRIG, HDL, CHOLHDL, VLDL, LDLCALC Lab Results  Component Value Date   TSH 2.360 05/03/2016    Therapeutic Level Labs: No results found for: LITHIUM No results found for: VALPROATE No components found for:  CBMZ  Current Medications: Current Outpatient Medications  Medication Sig Dispense Refill  . buPROPion (WELLBUTRIN XL) 150 MG 24 hr tablet Take 300 mg daily and 150 mg daily (total of 450 mg) 90 tablet 0  . buPROPion (WELLBUTRIN XL) 300 MG  24 hr tablet Take 300 mg daily and 150 mg daily (total of 450 mg) 90 tablet 0  . FLUoxetine (PROZAC) 40 MG capsule Take 2 capsules (80 mg total) by mouth daily. 180 capsule 0  . lansoprazole (PREVACID) 15 MG capsule Take 15 mg by mouth daily at 12 noon.    Marland Kitchen LORazepam (ATIVAN) 1 MG tablet Take 1 tablet (1 mg total) by mouth daily as needed for anxiety. 30 tablet 0  . Lurasidone HCl 60 MG TABS Take 1 tablet (60 mg total) by mouth daily with breakfast. 90 tablet 0  . meloxicam (MOBIC) 15 MG tablet Take 1 tablet (15 mg total) by mouth daily. 90 tablet 3  . metoprolol succinate (TOPROL-XL) 50 MG 24 hr tablet TAKE 1 TABLET BY MOUTH DAILY. TAKE WITH OR IMMEDIATELY FOLLOWING A MEAL. 90 tablet 1  . vitamin B-12 (CYANOCOBALAMIN) 1000 MCG tablet Take 5,000 mcg by mouth daily.     . Vitamin D, Cholecalciferol, 1000 UNITS TABS Take 5,000 Units by mouth daily.      No current facility-administered medications for this visit.      Musculoskeletal: Strength & Muscle Tone: within normal limits Gait & Station: normal Patient leans: N/A  Psychiatric Specialty Exam: Review of Systems  Psychiatric/Behavioral: Negative for depression, hallucinations, memory loss, substance abuse and suicidal ideas. The patient is not nervous/anxious and does not have insomnia.   All other systems reviewed and are negative.   Blood pressure 120/78, pulse 76, height 5\' 3"  (1.6 m), weight 229 lb (103.9 kg), SpO2 98 %.Body mass index is 40.57 kg/m.  General Appearance: Fairly Groomed  Eye Contact:  Good  Speech:  Clear and Coherent  Volume:  Normal  Mood:  "tired"  Affect:  Appropriate, Congruent, Restricted and slightly down  Thought Process:  Coherent  Orientation:  Full (Time, Place, and Person)  Thought Content: Logical   Suicidal Thoughts:  No  Homicidal Thoughts:  No  Memory:  Immediate;   Good  Judgement:  Good  Insight:  Fair  Psychomotor Activity:  Normal  Concentration:  Concentration: Good and Attention  Span: Good  Recall:  Good  Fund of Knowledge: Good  Language: Good  Akathisia:  No  Handed:  Right  AIMS (if indicated): not done  Assets:  Communication Skills Desire for Improvement  ADL's:  Intact  Cognition: WNL  Sleep:  Good   Screenings: GAD-7     Office Visit from 05/09/2018 in [facility]  Total GAD-7 Score  21    PHQ2-9     [facility]09/2018 [facility]  PHQ-2 Total Score  4  6  3  6  4   PHQ-9 Total Score  11  18  13  19  14        Assessment and Plan:  Silvio Claymanimee Maciborski is a 47 y.o. year old female with a history of depression, who presents for follow up appointment for MDD (major depressive disorder), recurrent episode, mild (HCC)  # MDD, mild, recurrent without psychotic features Although patient reports significant fatigue, there has been improvement in anxiety. Psychosocial stressors including custody issues with her ex-husband, which was resolved. 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 Will continue fluoxetine, Wellbutrin for depression. Will continue latuda as adjunctive treatment for depression. Will continue ativan prn for anxiety. Discussed risk of oversedation and dependence. Discussed behavioral activation. She is encouraged to be followed by therapist.  Plan 1. Continue lurasidone 60 mg daily 2. Continue Wellbutrin 450 mg daily 3. Continue fluoxetine 80 mg daily 4 .Continue lorazepam 1 mg as needed for anxiety (she declines refill) 5.Return to clinic inthree months for 15 mins  The patient demonstrates the following risk factors for suicide: Chronic risk factors for suicide include: psychiatric disorder of depressionand  previous suicide attempts of overdosing medication. Acute risk factorsfor suicide include: family or marital conflict. Protective factorsfor this patient include: responsibility to others (children, family), coping skills and hope for the future. Patient is future oriented and agrees to come for next appointment.Considering these factors, the overall suicide risk at this point appears to be low. Patient isappropriate for outpatient follow    [name] Dock Baccam, MD 06/19/2018, 3:20 PM

## 2018-06-19 ENCOUNTER — Encounter (HOSPITAL_COMMUNITY): Payer: Self-pay | Admitting: Psychiatry

## 2018-06-19 ENCOUNTER — Ambulatory Visit (INDEPENDENT_AMBULATORY_CARE_PROVIDER_SITE_OTHER): Payer: 59 | Admitting: Psychiatry

## 2018-06-19 VITALS — BP 120/78 | HR 76 | Ht 63.0 in | Wt 229.0 lb

## 2018-06-19 DIAGNOSIS — F330 Major depressive disorder, recurrent, mild: Secondary | ICD-10-CM

## 2018-06-19 MED ORDER — BUPROPION HCL ER (XL) 300 MG PO TB24: ORAL_TABLET | ORAL | 0 refills | Status: DC

## 2018-06-19 MED ORDER — FLUOXETINE HCL 40 MG PO CAPS: 80.0000 mg | ORAL_CAPSULE | Freq: Every day | ORAL | 0 refills | Status: DC

## 2018-06-19 MED ORDER — BUPROPION HCL ER (XL) 150 MG PO TB24: ORAL_TABLET | ORAL | 0 refills | Status: DC

## 2018-06-19 MED ORDER — LURASIDONE HCL 60 MG PO TABS: 60.0000 mg | ORAL_TABLET | Freq: Every day | ORAL | 0 refills | Status: DC

## 2018-06-19 NOTE — Patient Instructions (Signed)
1. Continue lurasidone 60 mg daily 2. Continue Wellbutrin 450 mg daily 3. Continue fluoxetine 80 mg daily 4 .Continue lorazepam 1 mg as needed for anxiety 5.Return to clinic inthree months for 15 mins 

## 2018-09-06 ENCOUNTER — Other Ambulatory Visit (HOSPITAL_COMMUNITY): Payer: Self-pay | Admitting: Psychiatry

## 2018-09-06 MED ORDER — LORAZEPAM 1 MG PO TABS: 1.0000 mg | ORAL_TABLET | Freq: Every day | ORAL | 0 refills | Status: DC | PRN

## 2018-09-11 ENCOUNTER — Other Ambulatory Visit (HOSPITAL_COMMUNITY): Payer: Self-pay | Admitting: Psychiatry

## 2018-09-11 MED ORDER — FLUOXETINE HCL 40 MG PO CAPS: 80.0000 mg | ORAL_CAPSULE | Freq: Every day | ORAL | 0 refills | Status: DC

## 2018-09-13 NOTE — Progress Notes (Signed)
BH MD/PA/NP OP Progress Note  09/19/2018 3:23 PM Tracie James  MRN:  [id number]  Chief Complaint:  Chief Complaint    Follow-up; Depression     HPI:  Patient presents for follow-up appointment for depression.  She states that she has not been doing well.  Her daughter, age 50 decided to leave with her ex-husband.  Her ex-husband now has custody.  She feels hurt.  Her daughter did not mention the reason she chose to do this way.  She will have her daughter in a few weeks.  She is trying to have a plan  as she also needs to work.  She feels like she is not needed.  Although her older daughter, age 49 states with the patient, she will leave soon for college. She sleeps 6 hours.  Although she feels depressed, she has been able to function at work.  He has fair motivation and energy.  She denies SI.  She has slightly worsening anxiety, and takes Ativan up to twice a day at times.  She agrees to limit its use if able.  She denies panic attacks.   Lorazepam filled on 09/06/2018  Visit Diagnosis:    ICD-10-CM   1. MDD (major depressive disorder), recurrent episode, mild (HCC) F33.0     Past Psychiatric History: Please see initial evaluation for full details. I have reviewed the history. No updates at this time.     Past Medical History:  Past Medical History:  Diagnosis Date  . Anxiety   . Asthma   . Bipolar 2 disorder (HCC)   . Depression   . GERD (gastroesophageal reflux disease)   . Palpitations   . PVC (premature ventricular contraction)     Past Surgical History:  Procedure Laterality Date  . ABDOMINAL HYSTERECTOMY    . APPENDECTOMY      Family Psychiatric History: Please see initial evaluation for full details. I have reviewed the history. No updates at this time.     Family History:  Family History  Problem Relation Age of Onset  . Depression Mother   . Multiple sclerosis Mother   . Schizophrenia Maternal Grandmother   . Heart disease Maternal Grandmother    aortic valve replacement    Social History:  Social History   Socioeconomic History  . Marital status: Divorced    Spouse name: Not on file  . Number of children: Not on file  . Years of education: Not on file  . Highest education level: Not on file  Occupational History  . Not on file  Social Needs  . Financial resource strain: Not on file  . Food insecurity:    Worry: Not on file    Inability: Not on file  . Transportation needs:    Medical: Not on file    Non-medical: Not on file  Tobacco Use  . Smoking status: Former Smoker    Last attempt to quit: 03/31/2014    Years since quitting: 4.4  . Smokeless tobacco: Never Used  Substance and Sexual Activity  . Alcohol use: No  . Drug use: No  . Sexual activity: Yes    Partners: Male    Birth control/protection: Surgical  Lifestyle  . Physical activity:    Days per week: Not on file    Minutes per session: Not on file  . Stress: Not on file  Relationships  . Social connections:    Talks on phone: Not on file    Gets together: Not on file  Attends religious service: Not on file    Active member of club or organization: Not on file    Attends meetings of clubs or organizations: Not on file    Relationship status: Not on file  Other Topics Concern  . Not on file  Social History Narrative  . Not on file    Allergies:  Allergies  Allergen Reactions  . Erythromycin Hives  . Tetracyclines & Related Hives    Metabolic Disorder Labs: No results found for: HGBA1C, MPG No results found for: PROLACTIN No results found for: CHOL, TRIG, HDL, CHOLHDL, VLDL, LDLCALC Lab Results  Component Value Date   TSH 2.360 05/03/2016    Therapeutic Level Labs: No results found for: LITHIUM No results found for: VALPROATE No components found for:  CBMZ  Current Medications: Current Outpatient Medications  Medication Sig Dispense Refill  . buPROPion (WELLBUTRIN XL) 150 MG 24 hr tablet Take 300 mg daily and 150 mg daily  (total of 450 mg) 90 tablet 0  . buPROPion (WELLBUTRIN XL) 300 MG 24 hr tablet Take 300 mg daily and 150 mg daily (total of 450 mg) 90 tablet 0  . FLUoxetine (PROZAC) 40 MG capsule Take 2 capsules (80 mg total) by mouth daily. 180 capsule 0  . lansoprazole (PREVACID) 15 MG capsule Take 15 mg by mouth daily at 12 noon.    Marland Kitchen LORazepam (ATIVAN) 1 MG tablet Take 1 tablet (1 mg total) by mouth daily as needed for anxiety. 30 tablet 0  . Lurasidone HCl 60 MG TABS Take 1 tablet (60 mg total) by mouth daily with breakfast. 90 tablet 0  . meloxicam (MOBIC) 15 MG tablet Take 1 tablet (15 mg total) by mouth daily. 90 tablet 3  . metoprolol succinate (TOPROL-XL) 50 MG 24 hr tablet TAKE 1 TABLET BY MOUTH DAILY. TAKE WITH OR IMMEDIATELY FOLLOWING A MEAL. 90 tablet 1  . vitamin B-12 (CYANOCOBALAMIN) 1000 MCG tablet Take 5,000 mcg by mouth daily.     . Vitamin D, Cholecalciferol, 1000 UNITS TABS Take 5,000 Units by mouth daily.      No current facility-administered medications for this visit.      Musculoskeletal: Strength & Muscle Tone: within normal limits Gait & Station: normal Patient leans: N/A  Psychiatric Specialty Exam: Review of Systems  Psychiatric/Behavioral: Positive for depression. Negative for hallucinations, memory loss, substance abuse and suicidal ideas. The patient is nervous/anxious. The patient does not have insomnia.   All other systems reviewed and are negative.   Blood pressure 114/77, pulse 78, height 5\' 3"  (1.6 m), weight 236 lb (107 kg), SpO2 99 %.Body mass index is 41.81 kg/m.  General Appearance: Fairly Groomed  Eye Contact:  Good  Speech:  Clear and Coherent  Volume:  Normal  Mood:  Depressed  Affect:  Appropriate, Congruent and Tearful  Thought Process:  Coherent  Orientation:  Full (Time, Place, and Person)  Thought Content: Logical   Suicidal Thoughts:  No  Homicidal Thoughts:  No  Memory:  Immediate;   Good  Judgement:  Good  Insight:  Good  Psychomotor  Activity:  Normal  Concentration:  Concentration: Good and Attention Span: Good  Recall:  Good  Fund of Knowledge: Good  Language: Good  Akathisia:  No  Handed:  Right  AIMS (if indicated): not done  Assets:  Communication Skills Desire for Improvement  ADL's:  Intact  Cognition: WNL  Sleep:  Fair   Screenings: GAD-7     Office Visit from 05/09/2018 in [facility]  Total GAD-7 Score  21    PHQ2-9     Office Visit from 05/09/2018 in [facility] Office Visit from 05/03/2016 in [facility] Office Visit from 04/07/2016 in [facility] Office Visit from 03/01/2016 in [facility] Office Visit from 01/21/2016 in [facility]  PHQ-2 Total Score  PHQ-9 Total Score  Assessment and Plan:  Tracie James is a 48 y.o. year old female with a history of depression , who presents for follow up appointment for MDD (major depressive disorder), recurrent episode, mild (HCC)  # MDD, mild, recurrent without psychotic features Although patient reports significant sorrow in the context of her daughter decided to leave with her ex-husband, she has been able to function relatively well.  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  Will continue fluoxetine and Wellbutrin for depression.  Will continue Latuda as adjunctive treatment for depression.  Will continue Ativan as needed for anxiety.  Discussed risk of dependence and oversedation.  Validated her grief over custody issues.   Plan I have reviewed and updated plans as below 1. Continue lurasidone 60 mg daily 2. Continue Wellbutrin 450 mg daily 3. Continue fluoxetine 80 mg daily 4 .Continue lorazepam 1 mg as needed for anxiety (she declines refill) 5.Return to clinic inthree months for 15 mins  The patient demonstrates the following risk  factors for suicide: Chronic risk factors for suicide include: psychiatric disorder of depressionand previous suicide attempts of overdosing medication. Acute risk factorsfor suicide include: family or marital conflict. Protective factorsfor this patient include: responsibility to others (children, family), coping skills and hope for the future. Patient is future oriented and agrees to come for next appointment.Considering these factors, the overall suicide risk at this point appears to be low. Patient isappropriate for outpatient follow   [name], MD 09/19/2018, 3:23 PM

## 2018-09-19 ENCOUNTER — Ambulatory Visit (INDEPENDENT_AMBULATORY_CARE_PROVIDER_SITE_OTHER): Payer: 59 | Admitting: Psychiatry

## 2018-09-19 ENCOUNTER — Encounter (HOSPITAL_COMMUNITY): Payer: Self-pay | Admitting: Psychiatry

## 2018-09-19 VITALS — BP 114/77 | HR 78 | Ht 63.0 in | Wt 236.0 lb

## 2018-09-19 DIAGNOSIS — F330 Major depressive disorder, recurrent, mild: Secondary | ICD-10-CM | POA: Diagnosis not present

## 2018-09-19 NOTE — Patient Instructions (Signed)
1. Continue lurasidone 60 mg daily 2. Continue Wellbutrin 450 mg daily 3. Continue fluoxetine 80 mg daily 4 .Continue lorazepam 1 mg as needed for anxiety 5.Return to clinic inthree months for 15 mins

## 2018-10-06 ENCOUNTER — Other Ambulatory Visit (HOSPITAL_COMMUNITY): Payer: Self-pay | Admitting: Psychiatry

## 2018-10-06 MED ORDER — BUPROPION HCL ER (XL) 150 MG PO TB24: ORAL_TABLET | ORAL | 0 refills | Status: DC
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
## 2018-10-12 ENCOUNTER — Encounter (HOSPITAL_COMMUNITY): Payer: Self-pay | Admitting: Psychiatry

## 2018-10-12 ENCOUNTER — Ambulatory Visit (INDEPENDENT_AMBULATORY_CARE_PROVIDER_SITE_OTHER): Payer: 59 | Admitting: Psychiatry

## 2018-10-12 ENCOUNTER — Other Ambulatory Visit: Payer: Self-pay

## 2018-10-12 DIAGNOSIS — F330 Major depressive disorder, recurrent, mild: Secondary | ICD-10-CM | POA: Diagnosis not present

## 2018-10-12 DIAGNOSIS — Z636 Dependent relative needing care at home: Secondary | ICD-10-CM

## 2018-10-12 DIAGNOSIS — Z79899 Other long term (current) drug therapy: Secondary | ICD-10-CM

## 2018-10-12 DIAGNOSIS — Z638 Other specified problems related to primary support group: Secondary | ICD-10-CM | POA: Diagnosis not present

## 2018-10-20 ENCOUNTER — Telehealth (HOSPITAL_COMMUNITY): Payer: Self-pay | Admitting: Psychiatry

## 2018-10-20 NOTE — Telephone Encounter (Signed)
Received consent form from patient to discuss with Ms. Danae Orleans [phone].   Left voice message to contact the office.

## 2018-10-27 ENCOUNTER — Telehealth (HOSPITAL_COMMUNITY): Payer: Self-pay | Admitting: Psychiatry

## 2018-10-27 NOTE — Telephone Encounter (Signed)
Late entry. The phone meeting was scheduled with Ms. Danae Orleans [phone] on 4/9. The person was not available on the phone during the scheduled time. Left voice message to contact the office.

## 2018-12-04 ENCOUNTER — Other Ambulatory Visit (HOSPITAL_COMMUNITY): Payer: Self-pay | Admitting: Psychiatry

## 2018-12-04 MED ORDER — FLUOXETINE HCL 40 MG PO CAPS: 80.0000 mg | ORAL_CAPSULE | Freq: Every day | ORAL | 0 refills | Status: DC

## 2018-12-06 ENCOUNTER — Other Ambulatory Visit: Payer: Self-pay | Admitting: Nurse Practitioner

## 2018-12-12 ENCOUNTER — Other Ambulatory Visit (HOSPITAL_COMMUNITY): Payer: Self-pay | Admitting: Psychiatry

## 2018-12-12 MED ORDER — LURASIDONE HCL 60 MG PO TABS: 60.0000 mg | ORAL_TABLET | Freq: Every day | ORAL | 0 refills | Status: DC
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
## 2018-12-21 ENCOUNTER — Other Ambulatory Visit: Payer: Self-pay

## 2018-12-21 ENCOUNTER — Encounter (HOSPITAL_COMMUNITY): Payer: Self-pay | Admitting: Psychiatry

## 2018-12-21 ENCOUNTER — Ambulatory Visit (INDEPENDENT_AMBULATORY_CARE_PROVIDER_SITE_OTHER): Payer: 59 | Admitting: Psychiatry

## 2018-12-21 DIAGNOSIS — F330 Major depressive disorder, recurrent, mild: Secondary | ICD-10-CM | POA: Diagnosis not present

## 2018-12-21 MED ORDER — BUPROPION HCL ER (XL) 300 MG PO TB24: ORAL_TABLET | ORAL | 0 refills | Status: DC

## 2018-12-21 MED ORDER — BUPROPION HCL ER (XL) 150 MG PO TB24: ORAL_TABLET | ORAL | 0 refills | Status: DC

## 2018-12-21 MED ORDER — LURASIDONE HCL 60 MG PO TABS: 60.0000 mg | ORAL_TABLET | Freq: Every day | ORAL | 0 refills | Status: DC

## 2018-12-21 MED ORDER — FLUOXETINE HCL 40 MG PO CAPS: 80.0000 mg | ORAL_CAPSULE | Freq: Every day | ORAL | 0 refills | Status: DC

## 2018-12-21 MED ORDER — LORAZEPAM 1 MG PO TABS: 1.0000 mg | ORAL_TABLET | Freq: Every day | ORAL | 0 refills | Status: DC | PRN

## 2019-03-12 NOTE — Progress Notes (Signed)
Virtual Visit via Telephone Note  I connected with Tracie James on 03/15/19 at  3:00 PM EDT by telephone and verified that I am speaking with the correct person using two identifiers.   I discussed the limitations, risks, security and privacy concerns of performing an evaluation and management service by telephone and the availability of in person appointments. I also discussed with the patient that there may be a patient responsible charge related to this service. The patient expressed understanding and agreed to proceed.      I discussed the assessment and treatment plan with the patient. The patient was provided an opportunity to ask questions and all were answered. The patient agreed with the plan and demonstrated an understanding of the instructions.   The patient was advised to call back or seek an in-person evaluation if the symptoms worsen or if the condition fails to improve as anticipated.  I provided 15 minutes of non-face-to-face time during this encounter.   Norman Clay, MD   [facility] MD/PA/NP OP Progress Note  03/15/2019 3:20 PM Tracie James  MRN:  [id number]  Chief Complaint:  Chief Complaint    Follow-up; Depression     HPI:  This is a follow-up appointment for depression.  She states that she was very sad when her oldest daughter went to [facility]. She has been adjusting to the new life. She has an upcoming court on 9/21. She was able to see her daughter in person under supervision, and does video chat with social worker's computer every week.  She reports good relationship with her daughter.  She states that it has been "tough" since she lost her mother, although she thinks that "there is nothing I can do." She denies any issues at work. She feels comfortable with "confinement" since pandemic started as she does not want to interact with other people. She has maintains good relationship with her niece and saw items for her. She denies insomnia.  She  feels fatigued at times.  She has good concentration.  She denies SI.  She feels anxious sometimes at times.  She had a panic attack last month; she took Ativan with good effect.  She has not taken any Ativan since then.   Visit Diagnosis:    ICD-10-CM   1. MDD (major depressive disorder), recurrent episode, mild ([place])  F33.0     Past Psychiatric History: Please see initial evaluation for full details. I have reviewed the history. No updates at this time.     Past Medical History:  Past Medical History:  Diagnosis Date  . Anxiety   . Asthma   . Bipolar 2 disorder ([place])   . Depression   . GERD (gastroesophageal reflux disease)   . Palpitations   . PVC (premature ventricular contraction)     Past Surgical History:  Procedure Laterality Date  . ABDOMINAL HYSTERECTOMY    . APPENDECTOMY      Family Psychiatric History: Please see initial evaluation for full details. I have reviewed the history. No updates at this time.     Family History:  Family History  Problem Relation Age of Onset  . Depression Mother   . Multiple sclerosis Mother   . Schizophrenia Maternal Grandmother   . Heart disease Maternal Grandmother        aortic valve replacement    Social History:  Social History   Socioeconomic History  . Marital status: Divorced    Spouse name: Not on file  . Number of children: Not  on file  . Years of education: Not on file  . Highest education level: Not on file  Occupational History  . Not on file  Social Needs  . Financial resource strain: Not on file  . Food insecurity    Worry: Not on file    Inability: Not on file  . Transportation needs    Medical: Not on file    Non-medical: Not on file  Tobacco Use  . Smoking status: Former Smoker    Quit date: 03/31/2014    Years since quitting: 4.9  . Smokeless tobacco: Never Used  Substance and Sexual Activity  . Alcohol use: No  . Drug use: No  . Sexual activity: Yes    Partners: Male    Birth  control/protection: Surgical  Lifestyle  . Physical activity    Days per week: Not on file    Minutes per session: Not on file  . Stress: Not on file  Relationships  . Social Musicianconnections    Talks on phone: Not on file    Gets together: Not on file    Attends religious service: Not on file    Active member of club or organization: Not on file    Attends meetings of clubs or organizations: Not on file    Relationship status: Not on file  Other Topics Concern  . Not on file  Social History Narrative  . Not on file    Allergies:  Allergies  Allergen Reactions  . Erythromycin Hives  . Tetracyclines & Related Hives    Metabolic Disorder Labs: No results found for: HGBA1C, MPG No results found for: PROLACTIN No results found for: CHOL, TRIG, HDL, CHOLHDL, VLDL, LDLCALC Lab Results  Component Value Date   TSH 2.360 05/03/2016    Therapeutic Level Labs: No results found for: LITHIUM No results found for: VALPROATE No components found for:  CBMZ  Current Medications: Current Outpatient Medications  Medication Sig Dispense Refill  . [START ON 04/08/2019] buPROPion (WELLBUTRIN XL) 150 MG 24 hr tablet Take 300 mg daily and 150 mg daily (total of 450 mg) 90 tablet 0  . [START ON 03/23/2019] buPROPion (WELLBUTRIN XL) 300 MG 24 hr tablet Take 300 mg daily and 150 mg daily (total of 450 mg) 90 tablet 0  . FLUoxetine (PROZAC) 40 MG capsule Take 2 capsules (80 mg total) by mouth daily. 180 capsule 0  . lansoprazole (PREVACID) 15 MG capsule Take 15 mg by mouth daily at 12 noon.    Marland Kitchen. LORazepam (ATIVAN) 1 MG tablet Take 1 tablet (1 mg total) by mouth daily as needed for anxiety. 30 tablet 0  . Lurasidone HCl 60 MG TABS Take 1 tablet (60 mg total) by mouth daily with breakfast. 90 tablet 0  . meloxicam (MOBIC) 15 MG tablet Take 1 tablet (15 mg total) by mouth daily. 90 tablet 3  . metoprolol succinate (TOPROL-XL) 50 MG 24 hr tablet TAKE 1 TABLET BY MOUTH DAILY. TAKE WITH OR IMMEDIATELY  FOLLOWING A MEAL. 90 tablet 1  . vitamin B-12 (CYANOCOBALAMIN) 1000 MCG tablet Take 5,000 mcg by mouth daily.     . Vitamin D, Cholecalciferol, 1000 UNITS TABS Take 5,000 Units by mouth daily.      No current facility-administered medications for this visit.      Musculoskeletal: Strength & Muscle Tone: N./A Gait & Station: N?A Patient leans: N/A  Psychiatric Specialty Exam: Review of Systems  Psychiatric/Behavioral: Positive for depression. Negative for hallucinations, memory loss, substance abuse and suicidal  ideas. The patient is nervous/anxious. The patient does not have insomnia.   All other systems reviewed and are negative.   There were no vitals taken for this visit.There is no height or weight on file to calculate BMI.  General Appearance: NA  Eye Contact:  NA  Speech:  Clear and Coherent  Volume:  Normal  Mood:  "good"  Affect:  NA  Thought Process:  Coherent  Orientation:  Full (Time, Place, and Person)  Thought Content: Logical   Suicidal Thoughts:  No  Homicidal Thoughts:  No  Memory:  Immediate;   Good  Judgement:  Good  Insight:  Fair  Psychomotor Activity:  Normal  Concentration:  Concentration: Good and Attention Span: Good  Recall:  Good  Fund of Knowledge: Good  Language: Good  Akathisia:  No  Handed:  Right  AIMS (if indicated): not done  Assets:  Communication Skills Desire for Improvement  ADL's:  Intact  Cognition: WNL  Sleep:  Fair   Screenings: GAD-7     [facility] Visit [facility]/2018 in [facility]  Total GAD-7 Score  21    PHQ2-9     [facility]2016 in [facility]2016 [facility] from [facility]/2016 in [facility]/2016 in [facility]  PHQ-2 Total Score  4  6  3  6  4   PHQ-9 Total Score  11  18  13  19  14        Assessment and Plan:  Silvio Claymanimee  James is a 48 y.o. year old female with a history of depression, who presents for follow up appointment for MDD (major depressive disorder), recurrent episode, mild ([facility])  # MDD, mild, recurrent without psychotic features Although she reports occasional depressive symptoms in the context of allegation against the patient from her husband and her mother with MS in a April, she has been handling things fairly well.  We will continue fluoxetine to target depression.  We will continue propafenone as adjunctive treatment for depression.  We will continue Latuda as adjunctive treatment for depression.  She is aware of these possible metabolic side effects.  We will continue Lorazepam as needed for anxiety.  Discussed risk of dependence and oversedation.   Plan 1. Continue fluoxetine 80 mg daily 2. ContinueBupropion450 mg daily 3. Continue lurasidone 60 mg daily 4 .Continue lorazepam 1 mg as needed for anxiety (she declined refill) 5. Next appointment: 11/5 at 3 PM for 20 mins, phone   The patient demonstrates the following risk factors for suicide: Chronic risk factors for suicide include: psychiatric disorder of depressionand previous suicide attempts of overdosing medication. Acute risk factorsfor suicide include: family or marital conflict. Protective factorsfor this patient include: responsibility to others (children, family), coping skills and hope for the future. Patient is future oriented and agrees to come for next appointment.Considering these factors, the overall suicide risk at this point appears to be low. Patient isappropriate for outpatient follow   [name] Ivin Rosenbloom, MD 03/15/2019, 3:20 PM

## 2019-03-15 ENCOUNTER — Ambulatory Visit (INDEPENDENT_AMBULATORY_CARE_PROVIDER_SITE_OTHER): Payer: 59 | Admitting: Psychiatry

## 2019-03-15 ENCOUNTER — Encounter (HOSPITAL_COMMUNITY): Payer: Self-pay | Admitting: Psychiatry

## 2019-03-15 ENCOUNTER — Other Ambulatory Visit: Payer: Self-pay

## 2019-03-15 DIAGNOSIS — F330 Major depressive disorder, recurrent, mild: Secondary | ICD-10-CM | POA: Diagnosis not present

## 2019-03-15 MED ORDER — BUPROPION HCL ER (XL) 300 MG PO TB24: ORAL_TABLET | ORAL | 0 refills | Status: DC

## 2019-03-15 MED ORDER — BUPROPION HCL ER (XL) 150 MG PO TB24: ORAL_TABLET | ORAL | 0 refills | Status: DC

## 2019-03-15 NOTE — Patient Instructions (Signed)
1. Continue fluoxetine 80 mg daily 2. ContinueBupropion450 mg daily 3. Continue lurasidone 60 mg daily 4 .Continue lorazepam 1 mg as needed for anxiety  5.Next appointment: 11/5 at 3 PM

## 2019-04-18 ENCOUNTER — Encounter (HOSPITAL_COMMUNITY): Payer: Self-pay | Admitting: Psychiatry

## 2019-05-18 NOTE — Progress Notes (Signed)
Virtual Visit via Video Note  I connected with Tracie James on 05/28/19 at  8:20 AM EST by a video enabled telemedicine application and verified that I am speaking with the correct person using two identifiers.   I discussed the limitations of evaluation and management by telemedicine and the availability of in person appointments. The patient expressed understanding and agreed to proceed.     I discussed the assessment and treatment plan with the patient. The patient was provided an opportunity to ask questions and all were answered. The patient agreed with the plan and demonstrated an understanding of the instructions.   The patient was advised to call back or seek an in-person evaluation if the symptoms worsen or if the condition fails to improve as anticipated.  I provided 15 minutes of non-face-to-face time during this encounter.   Tracie Hotter, MD    [facility] MD/PA/NP OP Progress Note  05/28/2019 8:45 AM Tracie James  MRN:  [id number]  Chief Complaint:  Chief Complaint    Follow-up; Depression     HPI:  This is a follow-up appointment for depression.  She states that she is not feeling good today.  Today is her daughter's birthday, who will be turning 50 year old. The court in September was postponed to Dec 3rd. She could not meet with her daughter last month as her daughter was on quarantined as she was exposed to COVID. She saw her last in July, and misses to see her daughter. Her niece does not speak to the patient anymore as they thought the patient "killed their dog"; he became sick after the patient took care of the dog.  She reports good relationship with her oldest daughter, who went to [facility]. She misses her mother (deceased in 2022/11/18) at times.  She has insomnia for the past week.  She feels sad.  She has fair concentration.  She has mild anhedonia and low energy, although she is able to make herself do things. She denies SI. She feels anxious, tense.  She denies panic attacks. She has not taken Ativan since the last visit.   Visit Diagnosis:    ICD-10-CM   1. MDD (major depressive disorder), recurrent episode, mild ([facility])  F33.0     Past Psychiatric History: Please see initial evaluation for full details. I have reviewed the history. No updates at this time.     Past Medical History:  Past Medical History:  Diagnosis Date  . Anxiety   . Asthma   . Bipolar 2 disorder ([facility])   . Depression   . GERD (gastroesophageal reflux disease)   . Palpitations   . PVC (premature ventricular contraction)     Past Surgical History:  Procedure Laterality Date  . ABDOMINAL HYSTERECTOMY    . APPENDECTOMY      Family Psychiatric History: Please see initial evaluation for full details. I have reviewed the history. No updates at this time.     Family History:  Family History  Problem Relation Age of Onset  . Depression Mother   . Multiple sclerosis Mother   . Schizophrenia Maternal Grandmother   . Heart disease Maternal Grandmother        aortic valve replacement    Social History:  Social History   Socioeconomic History  . Marital status: Divorced    Spouse name: Not on file  . Number of children: Not on file  . Years of education: Not on file  . Highest education level: Not on file  Occupational  History  . Not on file  Social Needs  . Financial resource strain: Not on file  . Food insecurity    Worry: Not on file    Inability: Not on file  . Transportation needs    Medical: Not on file    Non-medical: Not on file  Tobacco Use  . Smoking status: Former Smoker    Quit date: 03/31/2014    Years since quitting: 5.1  . Smokeless tobacco: Never Used  Substance and Sexual Activity  . Alcohol use: No  . Drug use: No  . Sexual activity: Yes    Partners: Male    Birth control/protection: Surgical  Lifestyle  . Physical activity    Days per week: Not on file    Minutes per session: Not on file  . Stress: Not on file   Relationships  . Social Musicianconnections    Talks on phone: Not on file    Gets together: Not on file    Attends religious service: Not on file    Active member of club or organization: Not on file    Attends meetings of clubs or organizations: Not on file    Relationship status: Not on file  Other Topics Concern  . Not on file  Social History Narrative  . Not on file    Allergies:  Allergies  Allergen Reactions  . Erythromycin Hives  . Tetracyclines & Related Hives    Metabolic Disorder Labs: No results found for: HGBA1C, MPG No results found for: PROLACTIN No results found for: CHOL, TRIG, HDL, CHOLHDL, VLDL, LDLCALC Lab Results  Component Value Date   TSH 2.360 05/03/2016    Therapeutic Level Labs: No results found for: LITHIUM No results found for: VALPROATE No components found for:  CBMZ  Current Medications: Current Outpatient Medications  Medication Sig Dispense Refill  . buPROPion (WELLBUTRIN XL) 150 MG 24 hr tablet Take 300 mg daily and 150 mg daily (total of 450 mg) 90 tablet 1  . buPROPion (WELLBUTRIN XL) 300 MG 24 hr tablet Take 300 mg daily and 150 mg daily (total of 450 mg) 90 tablet 1  . FLUoxetine (PROZAC) 40 MG capsule Take 2 capsules (80 mg total) by mouth daily. 180 capsule 1  . lansoprazole (PREVACID) 15 MG capsule Take 15 mg by mouth daily at 12 noon.    Marland Kitchen. LORazepam (ATIVAN) 1 MG tablet Take 1 tablet (1 mg total) by mouth daily as needed for anxiety. 30 tablet 0  . Lurasidone HCl 60 MG TABS Take 1 tablet (60 mg total) by mouth daily with breakfast. 90 tablet 1  . meloxicam (MOBIC) 15 MG tablet TAKE 1 TABLET BY MOUTH EVERY DAY 30 tablet 0  . metoprolol succinate (TOPROL-XL) 50 MG 24 hr tablet TAKE 1 TABLET BY MOUTH DAILY. TAKE WITH OR IMMEDIATELY FOLLOWING A MEAL. 90 tablet 1  . vitamin B-12 (CYANOCOBALAMIN) 1000 MCG tablet Take 5,000 mcg by mouth daily.     . Vitamin D, Cholecalciferol, 1000 UNITS TABS Take 5,000 Units by mouth daily.      No current  facility-administered medications for this visit.      Musculoskeletal: Strength & Muscle Tone: N/A Gait & Station: N/A Patient leans: N/A  Psychiatric Specialty Exam: Review of Systems  Psychiatric/Behavioral: Positive for depression. Negative for hallucinations, memory loss, substance abuse and suicidal ideas. The patient is nervous/anxious and has insomnia.   All other systems reviewed and are negative.   There were no vitals taken for this visit.There is  no height or weight on file to calculate BMI.  General Appearance: Fairly Groomed  Eye Contact:  Good  Speech:  Clear and Coherent  Volume:  Normal  Mood:  "not good"  Affect:  Appropriate, Congruent, Restricted and Tearful  Thought Process:  Coherent  Orientation:  Full (Time, Place, and Person)  Thought Content: Logical   Suicidal Thoughts:  No  Homicidal Thoughts:  No  Memory:  Immediate;   Good  Judgement:  Good  Insight:  Fair  Psychomotor Activity:  Normal  Concentration:  Concentration: Good and Attention Span: Good  Recall:  Good  Fund of Knowledge: Good  Language: Good  Akathisia:  No  Handed:  Right  AIMS (if indicated): not done  Assets:  Communication Skills Desire for Improvement  ADL's:  Intact  Cognition: WNL  Sleep:  Poor   Screenings: GAD-7     Office Visit from 05/09/2018 in [place]  Total GAD-7 Score  21    PHQ2-9     Office Visit from 05/09/2018 in [place] Visit from 05/03/2016 in [place] Visit from 04/07/2016 in [place] Visit from 03/01/2016 in [place] Visit from 01/21/2016 in [place]  PHQ-2 Total Score  4  6  3  6  4   PHQ-9 Total Score  11  18  13  19  14        Assessment and Plan:  Dahlia Nifong is a 48 y.o. year old female with a history of depression , who presents for follow up appointment for MDD (major depressive disorder), recurrent episode, mild  ([place])  # MDD, mild, recurrent without psychotic features She reports slight worsening in depressive symptoms in the context of not being able to meet with her daughter for her birthday today due to the allegation against the patient from her husband.  Other psychosocial stressors includes loss of her mother with MS in April. Given her recent worsening in mood symptoms are considered situational, will continue her current medication.  We will continue fluoxetine to target depression.  Will continue bupropion as adjunctive treatment for depression.  Will continue duloxetine as adjunctive treatment for depression.  She is aware of possible metabolic side effect.  We will continue clonazepam as needed for anxiety.  Validated her grief.   Plan I have reviewed and updated plans as below 1. Continue fluoxetine 80 mg daily 2. ContinueBupropion450 mg daily 3. Continue lurasidone 60 mg daily 4 .Continue lorazepam 1 mg as needed for anxiety (she declined refill) 5.Next appointment: in January   The patient demonstrates the following risk factors for suicide: Chronic risk factors for suicide include: psychiatric disorder of depressionand previous suicide attempts of overdosing medication. Acute risk factorsfor suicide include: family or marital conflict. Protective factorsfor this patient include: responsibility to others (children, family), coping skills and hope for the future. Patient is future oriented and agrees to come for next appointment.Considering these factors, the overall suicide risk at this point appears to be low. Patient isappropriate for outpatient followup.  Norman Clay, MD 05/28/2019, 8:45 AM

## 2019-05-23 ENCOUNTER — Other Ambulatory Visit: Payer: Self-pay | Admitting: Nurse Practitioner

## 2019-05-23 NOTE — Telephone Encounter (Signed)
Requested medication (s) are due for refill today: no  Requested medication (s) are on the active medication list: yes  Last refill:  03/05/2019  Future visit scheduled: no  Notes to clinic:  Overdue for office visit  Review for refill    Requested Prescriptions  Pending Prescriptions Disp Refills   meloxicam (MOBIC) 15 MG tablet [Pharmacy Med Name: MELOXICAM 15 MG TABLET] 90 tablet 3    Sig: TAKE 1 TABLET BY MOUTH EVERY DAY     Analgesics:  COX2 Inhibitors Failed - 05/23/2019 12:20 PM      Failed - HGB in normal range and within 360 days    Hemoglobin  Date Value Ref Range Status  05/03/2016 12.3 11.1 - 15.9 g/dL Final         Failed - Cr in normal range and within 360 days    Creatinine, Ser  Date Value Ref Range Status  05/03/2016 0.87 0.57 - 1.00 mg/dL Final         Failed - Valid encounter within last 12 months    Recent Outpatient Visits          1 year ago Chronic pain syndrome   [facility] Venita Lick, NP   1 year ago Dermatitis   [facility] Volney American, [place]   2 years ago Left shoulder pain, unspecified chronicity   [place] Kathrine Haddock, NP   3 years ago Bipolar 2 disorder [facility])   [facility] Volney American, [place]   3 years ago Bipolar 2 disorder [facility])   [place], [place], NP             Passed - Patient is not pregnant

## 2019-05-23 NOTE — Telephone Encounter (Signed)
Will provide 30 day supply, but has not been seen in a year and needs follow-up

## 2019-05-23 NOTE — Telephone Encounter (Signed)
Letter sent to patient to call and schedule appointment for additional refills.

## 2019-05-24 ENCOUNTER — Ambulatory Visit (HOSPITAL_COMMUNITY): Payer: 59 | Admitting: Psychiatry

## 2019-05-28 ENCOUNTER — Ambulatory Visit (INDEPENDENT_AMBULATORY_CARE_PROVIDER_SITE_OTHER): Payer: 59 | Admitting: Psychiatry

## 2019-05-28 ENCOUNTER — Other Ambulatory Visit: Payer: Self-pay

## 2019-05-28 ENCOUNTER — Encounter (HOSPITAL_COMMUNITY): Payer: Self-pay | Admitting: Psychiatry

## 2019-05-28 DIAGNOSIS — F330 Major depressive disorder, recurrent, mild: Secondary | ICD-10-CM

## 2019-05-28 MED ORDER — LURASIDONE HCL 60 MG PO TABS: 60.0000 mg | ORAL_TABLET | Freq: Every day | ORAL | 1 refills | Status: DC

## 2019-05-28 MED ORDER — BUPROPION HCL ER (XL) 150 MG PO TB24: ORAL_TABLET | ORAL | 1 refills | Status: DC

## 2019-05-28 MED ORDER — FLUOXETINE HCL 40 MG PO CAPS: 80.0000 mg | ORAL_CAPSULE | Freq: Every day | ORAL | 1 refills | Status: DC

## 2019-05-28 MED ORDER — BUPROPION HCL ER (XL) 300 MG PO TB24: ORAL_TABLET | ORAL | 1 refills | Status: DC

## 2019-05-28 NOTE — Patient Instructions (Signed)
1. Continue fluoxetine 80 mg daily 2. ContinueBupropion450 mg daily 3. Continue lurasidone 60 mg daily 4 .Continue lorazepam 1 mg as needed for anxiety 5.Next appointment: in January

## 2019-06-16 ENCOUNTER — Other Ambulatory Visit: Payer: Self-pay | Admitting: Nurse Practitioner

## 2019-06-16 NOTE — Telephone Encounter (Signed)
Forwarding medication refill request to PCP for review. 

## 2019-06-16 NOTE — Telephone Encounter (Signed)
60 day supply sent only.  Needs appointment, either virtual or in office, for further refills.  Please schedule with her.  Have not seen in her office since October 2019.

## 2019-06-18 NOTE — Telephone Encounter (Signed)
Please get patient scheduled.  °

## 2019-06-19 ENCOUNTER — Other Ambulatory Visit: Payer: Self-pay

## 2019-06-19 DIAGNOSIS — Z20822 Contact with and (suspected) exposure to covid-19: Secondary | ICD-10-CM

## 2019-06-20 ENCOUNTER — Ambulatory Visit (INDEPENDENT_AMBULATORY_CARE_PROVIDER_SITE_OTHER): Payer: 59 | Admitting: Nurse Practitioner

## 2019-06-20 ENCOUNTER — Encounter: Payer: Self-pay | Admitting: Nurse Practitioner

## 2019-06-20 ENCOUNTER — Other Ambulatory Visit: Payer: Self-pay

## 2019-06-20 DIAGNOSIS — G894 Chronic pain syndrome: Secondary | ICD-10-CM | POA: Diagnosis not present

## 2019-06-20 DIAGNOSIS — R002 Palpitations: Secondary | ICD-10-CM

## 2019-06-20 DIAGNOSIS — F331 Major depressive disorder, recurrent, moderate: Secondary | ICD-10-CM | POA: Diagnosis not present

## 2019-06-20 MED ORDER — METOPROLOL SUCCINATE ER 50 MG PO TB24: ORAL_TABLET | ORAL | 3 refills | Status: DC

## 2019-06-20 MED ORDER — MELOXICAM 15 MG PO TABS: 15.0000 mg | ORAL_TABLET | Freq: Every day | ORAL | 2 refills | Status: DC

## 2019-06-20 NOTE — Patient Instructions (Signed)
Bipolar 1 Disorder Bipolar 1 disorder is a mental health disorder in which a person has episodes of emotional highs (mania), and may also have episodes of emotional lows (depression) in addition to highs. Bipolar 1 disorder is different from other bipolar disorders because it involves extreme manic episodes. These episodes last at least one week or involve symptoms that are so severe that hospitalization is needed to keep the person safe. What increases the risk? The cause of this condition is not known. However, certain factors make you more likely to have bipolar disorder, such as:  Having a family member with the disorder.  An imbalance of certain chemicals in the brain (neurotransmitters).  Stress, such as illness, financial problems, or a death.  Certain conditions that affect the brain or spinal cord (neurologic conditions).  Brain injury (trauma).  Having another mental health disorder, such as: ? Obsessive compulsive disorder. ? Schizophrenia. What are the signs or symptoms? Symptoms of mania include:  Very high self-esteem or self-confidence.  Decreased need for sleep.  Unusual talkativeness or feeling a need to keep talking. Speech may be very fast. It may seem like you cannot stop talking.  Racing thoughts or constant talking, with quick shifts between topics that may or may not be related (flight of ideas).  Decreased ability to focus or concentrate.  Increased purposeful activity, such as work, studies, or social activity.  Increased nonproductive activity. This could be pacing, squirming and fidgeting, or finger and toe tapping.  Impulsive behavior and poor judgment. This may result in high-risk activities, such as having unprotected sex or spending a lot of money. Symptoms of depression include:  Feeling sad, hopeless, or helpless.  Frequent or uncontrollable crying.  Lack of feeling or caring about anything.  Sleeping too much.  Moving more slowly than  usual.  Not being able to enjoy things you used to enjoy.  Wanting to be alone all the time.  Feeling guilty or worthless.  Lack of energy or motivation.  Trouble concentrating or remembering.  Trouble making decisions.  Increased appetite.  Thoughts of death, or the desire to harm yourself. Sometimes, you may have a mixed mood. This means having symptoms of depression and mania. Stress can make symptoms worse. How is this diagnosed? To diagnose bipolar disorder, your health care provider may ask about your:  Emotional episodes.  Medical history.  Alcohol and drug use. This includes prescription medicines. Certain medical conditions and substances can cause symptoms that seem like bipolar disorder (secondary bipolar disorder). How is this treated? Bipolar disorder is a long-term (chronic) illness. It is best controlled with ongoing (continuous) treatment rather than treatment only when symptoms occur. Treatment may include:  Medicine. Medicine can be prescribed by a provider who specializes in treating mental disorders (psychiatrist). ? Medicines called mood stabilizers are usually prescribed. ? If symptoms occur even while taking a mood stabilizer, other medicines may be added.  Psychotherapy. Some forms of talk therapy, such as cognitive-behavioral therapy (CBT), can provide support, education, and guidance.  Coping methods, such as journaling or relaxation exercises. These may include: ? Yoga. ? Meditation. ? Deep breathing.  Lifestyle changes, such as: ? Limiting alcohol and drug use. ? Exercising regularly. ? Getting plenty of sleep. ? Making healthy eating choices.  A combination of medicine, talk therapy, and coping methods is best. A procedure in which electricity is applied to the brain through the scalp (electroconvulsive therapy) may be used in cases of severe mania when medicine and psychotherapy work too   slowly or do not work. Follow these instructions at  home: Activity   Return to your normal activities as told by your health care provider.  Find activities that you enjoy, and make time to do them.  Exercise regularly as told by your health care provider. Lifestyle  Limit alcohol intake to no more than 1 drink a day for nonpregnant women and 2 drinks a day for men. One drink equals 12 oz of beer, 5 oz of wine, or 1 oz of hard liquor.  Follow a set schedule for eating and sleeping.  Eat a balanced diet that includes fresh fruits and vegetables, whole grains, low-fat dairy, and lean meat.  Get 7-8 hours of sleep each night. General instructions  Take over-the-counter and prescription medicines only as told by your health care provider.  Think about joining a support group. Your health care provider may be able to recommend a support group.  Talk with your family and loved ones about your treatment goals and how they can help.  Keep all follow-up visits as told by your health care provider. This is important. Where to find more information For more information about bipolar disorder, visit the following websites:  National Alliance on Mental Illness: www.nami.org  U.S. National Institute of Mental Health: www.nimh.nih.gov Contact a health care provider if:  Your symptoms get worse.  You have side effects from your medicine, and they get worse.  You have trouble sleeping.  You have trouble doing daily activities.  You feel unsafe in your surroundings.  You are dealing with substance abuse. Get help right away if:  You have new symptoms.  You have thoughts about harming yourself.  You self-harm. This information is not intended to replace advice given to you by your health care provider. Make sure you discuss any questions you have with your health care provider. Document Released: 10/11/2000 Document Revised: 06/17/2017 Document Reviewed: 03/04/2016 Elsevier Patient Education  2020 Elsevier Inc.  

## 2019-06-20 NOTE — Assessment & Plan Note (Signed)
Stable on Meloxicam, continue current regimen and adjust as needed. Refill sent.  Will bring workplace labs to next visit.

## 2019-06-20 NOTE — Assessment & Plan Note (Signed)
Continue Metoprolol and adjust dose as needed.  Return in 6 months for office visit and labs.

## 2019-06-20 NOTE — Progress Notes (Signed)
LMP  (LMP Unknown)    Subjective:    Patient ID: Tracie James, female    DOB: 02-Apr-1971, 48 y.o.   MRN: [id number]  HPI: Tracie James is a 48 y.o. female  Chief Complaint  Patient presents with  . Anxiety  . Depression    . This visit was completed via Doximity due to the restrictions of the COVID-19 pandemic. All issues as above were discussed and addressed. Physical exam was done as above through visual confirmation on Doximity. If it was felt that the patient should be evaluated in the office, they were directed there. The patient verbally consented to this visit. . Location of the patient: home . Location of the provider: work . Those involved with this call:  . Provider: Aura Dials, DNP . CMA: Wilhemena Durie, CMA . Front Desk/Registration: Harriet Pho  . Time spent on call: 15 minutes with patient face to face via video conference. More than 50% of this time was spent in counseling and coordination of care. 10 minutes total spent in review of patient's record and preparation of their chart.  . I verified patient identity using two factors (patient name and date of birth). Patient consents verbally to being seen via telemedicine visit today.    ANXIETY/STRESS/DEPRESSION Continues on Wellbutrin, Fluoxetine, Lurasidone, and Ativan.  Followed by psychiatry, sees every 3 months.  States current regimen works well. Duration:stable Anxious mood: occasional  Excessive worrying: no Irritability: no  Sweating: no Nausea: no Palpitations:no Hyperventilation: no Panic attacks: no Agoraphobia: no  Obscessions/compulsions: no Depressed mood: occasional Depression screen [facility] 2/9 06/20/2019 05/09/2018 05/03/2016 04/07/2016 03/01/2016  Decreased Interest 3 3 3 2 3   Down, Depressed, Hopeless 3 1 3 1 3   PHQ - 2 Score 6 4 6 3 6   Altered sleeping 0 1 3 2 3   Tired, decreased energy 3 3 3 3 3   Change in appetite 3 3 3 3  0  Feeling bad or failure about yourself  3 0 3 1 3    Trouble concentrating 0 0 0 1 1  Moving slowly or fidgety/restless 0 0 0 0 0  Suicidal thoughts 0 0 0 0 3  PHQ-9 Score 15 11 18 13 19   Difficult doing work/chores Not difficult at all Somewhat difficult Somewhat difficult - Somewhat difficult   Anhedonia: no Weight changes: no Insomnia: none Hypersomnia: no Fatigue/loss of energy: no Feelings of worthlessness: no Feelings of guilt: no Impaired concentration/indecisiveness: no Suicidal ideations: no  Crying spells: no Recent Stressors/Life Changes: no   Relationship problems: no   Family stress: no     Financial stress: no    Job stress: no    Recent death/loss: no GAD 7 : Generalized Anxiety Score 06/20/2019 05/09/2018  Nervous, Anxious, on Edge 3 3  Control/stop worrying 3 3  Worry too much - different things 3 3  Trouble relaxing 3 3  Restless 3 3  Easily annoyed or irritable 3 3  Afraid - awful might happen 3 3  Total GAD 7 Score 21 21  Anxiety Difficulty Not difficult at all Very difficult   CHRONIC PAIN SYNDROME: Has been chronic issue since 2017.  Has been followed by PT and ortho in past. Currently takes Meloxicam every day with no ADR and reports adequate relief with this medication.  Is able to function at work on daily basis and perform all ADL's without difficulty.  Had lab work done today at her workplace (CBC, CMP, Lipid panel, TSH, A1C, and Vitamin D).  She  is going to send these lab reports to provider once resulted.    PALPITATIONS: History of palpitations, currently takes Metoprolol daily and this is well-controlled.  Denies CP, SOB, palpitations.  Relevant past medical, surgical, family and social history reviewed and updated as indicated. Interim medical history since our last visit reviewed. Allergies and medications reviewed and updated.  Review of Systems  Constitutional: Negative for activity change, appetite change, diaphoresis, fatigue and fever.  Respiratory: Negative for cough, chest tightness  and shortness of breath.   Cardiovascular: Negative for chest pain, palpitations and leg swelling.  Gastrointestinal: Negative for abdominal distention, abdominal pain, constipation, diarrhea, nausea and vomiting.  Neurological: Negative for dizziness, syncope, weakness, light-headedness, numbness and headaches.  Psychiatric/Behavioral: Negative.     Per HPI unless specifically indicated above     Objective:    LMP  (LMP Unknown)   Wt Readings from Last 3 Encounters:  05/09/18 232 lb 4 oz (105.3 kg)  10/10/17 229 lb 9.6 oz (104.1 kg)  10/20/16 230 lb 9.6 oz (104.6 kg)    Physical Exam Vitals signs and nursing note reviewed.  Constitutional:      General: She is awake. She is not in acute distress.    Appearance: She is well-developed. She is not ill-appearing.  HENT:     Head: Normocephalic.     Right Ear: Hearing normal.     Left Ear: Hearing normal.  Eyes:     General: Lids are normal.        Right eye: No discharge.        Left eye: No discharge.     Conjunctiva/sclera: Conjunctivae normal.  Neck:     Musculoskeletal: Normal range of motion.  Pulmonary:     Effort: Pulmonary effort is normal. No accessory muscle usage or respiratory distress.  Neurological:     Mental Status: She is alert and oriented to person, place, and time.  Psychiatric:        Attention and Perception: Attention normal.        Mood and Affect: Mood normal.        Behavior: Behavior normal. Behavior is cooperative.        Thought Content: Thought content normal.        Judgment: Judgment normal.     Results for orders placed or performed in visit on 05/03/16  CBC with Differential/Platelet  Result Value Ref Range   WBC 10.8 3.4 - 10.8 x10E3/uL   RBC 4.23 3.77 - 5.28 x10E6/uL   Hemoglobin 12.3 11.1 - 15.9 g/dL   Hematocrit 36.0 34.0 - 46.6 %   MCV 85 79 - 97 fL   MCH 29.1 26.6 - 33.0 pg   MCHC 34.2 31.5 - 35.7 g/dL   RDW 13.6 12.3 - 15.4 %   Platelets 242 150 - 379 x10E3/uL    Neutrophils 73 Not Estab. %   Lymphs 20 Not Estab. %   Monocytes 5 Not Estab. %   Eos 2 Not Estab. %   Basos 0 Not Estab. %   Neutrophils Absolute 7.8 (H) 1.4 - 7.0 x10E3/uL   Lymphocytes Absolute 2.2 0.7 - 3.1 x10E3/uL   Monocytes Absolute 0.5 0.1 - 0.9 x10E3/uL   EOS (ABSOLUTE) 0.3 0.0 - 0.4 x10E3/uL   Basophils Absolute 0.0 0.0 - 0.2 x10E3/uL   Immature Granulocytes 0 Not Estab. %   Immature Grans (Abs) 0.0 0.0 - 0.1 x10E3/uL  TSH  Result Value Ref Range   TSH 2.360 0.450 - 4.500 uIU/mL  VITAMIN D 25 Hydroxy (Vit-D Deficiency, Fractures)  Result Value Ref Range   Vit D, 25-Hydroxy 47.1 30.0 - 100.0 ng/mL  Comprehensive metabolic panel  Result Value Ref Range   Glucose 86 65 - 99 mg/dL   BUN 12 6 - 24 mg/dL   Creatinine, Ser 1.610.87 0.57 - 1.00 mg/dL   GFR calc non Af Amer 80 >59 mL/min/1.73   GFR calc Af Amer 92 >59 mL/min/1.73   BUN/Creatinine Ratio 14 9 - 23   Sodium 138 134 - 144 mmol/L   Potassium 4.3 3.5 - 5.2 mmol/L   Chloride 99 96 - 106 mmol/L   CO2 25 18 - 29 mmol/L   Calcium 8.8 8.7 - 10.2 mg/dL   Total Protein 6.5 6.0 - 8.5 g/dL   Albumin 4.1 3.5 - 5.5 g/dL   Globulin, Total 2.4 1.5 - 4.5 g/dL   Albumin/Globulin Ratio 1.7 1.2 - 2.2   Bilirubin Total 0.2 0.0 - 1.2 mg/dL   Alkaline Phosphatase 86 39 - 117 IU/L   AST 22 0 - 40 IU/L   ALT 22 0 - 32 IU/L      Assessment & Plan:   Problem List Items Addressed This Visit      Other   Palpitations    Continue Metoprolol and adjust dose as needed.  Return in 6 months for office visit and labs.      Major depressive disorder, recurrent episode, moderate (HCC) - Primary    Followed by psychiatry, continue this collaboration and current medication regimen.      Chronic pain syndrome    Stable on Meloxicam, continue current regimen and adjust as needed. Refill sent.  Will bring workplace labs to next visit.      Relevant Medications   meloxicam (MOBIC) 15 MG tablet       Follow up plan: Return in about 6  months (around 12/19/2019) for Bipolar, Pain, Palpitations.

## 2019-06-20 NOTE — Assessment & Plan Note (Signed)
Followed by psychiatry, continue this collaboration and current medication regimen.

## 2019-06-21 LAB — NOVEL CORONAVIRUS, NAA: SARS-CoV-2, NAA: NOT DETECTED

## 2019-06-27 ENCOUNTER — Other Ambulatory Visit: Payer: Self-pay

## 2019-06-27 DIAGNOSIS — Z20822 Contact with and (suspected) exposure to covid-19: Secondary | ICD-10-CM

## 2019-06-28 LAB — NOVEL CORONAVIRUS, NAA: SARS-CoV-2, NAA: DETECTED — AB

## 2019-07-12 ENCOUNTER — Other Ambulatory Visit: Payer: Self-pay | Admitting: Nurse Practitioner

## 2019-08-13 ENCOUNTER — Ambulatory Visit: Payer: 59 | Admitting: Psychiatry

## 2019-08-30 ENCOUNTER — Ambulatory Visit (INDEPENDENT_AMBULATORY_CARE_PROVIDER_SITE_OTHER): Payer: 59 | Admitting: Psychiatry

## 2019-08-30 ENCOUNTER — Other Ambulatory Visit: Payer: Self-pay

## 2019-08-30 ENCOUNTER — Encounter: Payer: Self-pay | Admitting: Psychiatry

## 2019-08-30 DIAGNOSIS — F3341 Major depressive disorder, recurrent, in partial remission: Secondary | ICD-10-CM

## 2019-08-30 DIAGNOSIS — F419 Anxiety disorder, unspecified: Secondary | ICD-10-CM

## 2019-08-30 MED ORDER — BUPROPION HCL ER (XL) 300 MG PO TB24: ORAL_TABLET | ORAL | 0 refills | Status: DC

## 2019-08-30 MED ORDER — FLUOXETINE HCL 40 MG PO CAPS: 80.0000 mg | ORAL_CAPSULE | Freq: Every day | ORAL | 0 refills | Status: DC

## 2019-08-30 MED ORDER — BUPROPION HCL ER (XL) 150 MG PO TB24: ORAL_TABLET | ORAL | 0 refills | Status: DC

## 2019-08-30 MED ORDER — LORAZEPAM 1 MG PO TABS: 1.0000 mg | ORAL_TABLET | Freq: Every day | ORAL | 1 refills | Status: DC | PRN

## 2019-08-30 MED ORDER — LURASIDONE HCL 60 MG PO TABS: 60.0000 mg | ORAL_TABLET | Freq: Every day | ORAL | 0 refills | Status: DC

## 2019-08-30 NOTE — Progress Notes (Signed)
BH MD/PA/NP OP Progress Note  I connected with  Tracie James on 08/30/19 by a video enabled telemedicine application and verified that I am speaking with the correct person using two identifiers.   I discussed the limitations of evaluation and management by telemedicine. The patient expressed understanding and agreed to proceed.    08/30/2019 8:42 AM Tracie James  MRN:  [id number]  Chief Complaint: " I am doing okay."  HPI: Patient reported that she has been doing okay.  She has been working, works at [employer].  She stated that her daughter was able to come home for Christmas and is spent a good amount of time together.  She stated that everything went well and eventually she had to return back to [place] to be with her father.  Her daughter is still in [place] currently and they have custody hearing in the court on Monday.  Patient is planning to drive there tomorrow so that she can be present for hearing in the court.  She stated that last year was terrible and she is hoping that this year would be better and she can resolve some of the issues. She denied any suicidal ideations and she is optimistic for the future.  Visit Diagnosis:    ICD-10-CM   1. MDD (major depressive disorder), recurrent, in partial remission (HCC)  F33.41   2. Anxiety  F41.9     Past Psychiatric History: MDD, anxiety  Past Medical History:  Past Medical History:  Diagnosis Date  . Anxiety   . Asthma   . Bipolar 2 disorder (HCC)   . Depression   . GERD (gastroesophageal reflux disease)   . Palpitations   . PVC (premature ventricular contraction)     Past Surgical History:  Procedure Laterality Date  . ABDOMINAL HYSTERECTOMY    . APPENDECTOMY      Family Psychiatric History: see below  Family History:  Family History  Problem Relation Age of Onset  . Depression Mother   . Multiple sclerosis Mother   . Schizophrenia Maternal Grandmother   . Heart disease Maternal Grandmother         aortic valve replacement    Social History:  Social History   Socioeconomic History  . Marital status: Divorced    Spouse name: Not on file  . Number of children: Not on file  . Years of education: Not on file  . Highest education level: Not on file  Occupational History  . Not on file  Tobacco Use  . Smoking status: Former Smoker    Quit date: 03/31/2014    Years since quitting: 5.4  . Smokeless tobacco: Never Used  Substance and Sexual Activity  . Alcohol use: No  . Drug use: No  . Sexual activity: Yes    Partners: Male    Birth control/protection: Surgical  Other Topics Concern  . Not on file  Social History Narrative  . Not on file   Social Determinants of Health   Financial Resource Strain:   . Difficulty of Paying Living Expenses: Not on file  Food Insecurity:   . Worried About Programme researcher, broadcasting/film/video in the Last Year: Not on file  . Ran Out of Food in the Last Year: Not on file  Transportation Needs:   . Lack of Transportation (Medical): Not on file  . Lack of Transportation (Non-Medical): Not on file  Physical Activity:   . Days of Exercise per Week: Not on file  . Minutes of Exercise per  Session: Not on file  Stress:   . Feeling of Stress : Not on file  Social Connections:   . Frequency of Communication with Friends and Family: Not on file  . Frequency of Social Gatherings with Friends and Family: Not on file  . Attends Religious Services: Not on file  . Active Member of Clubs or Organizations: Not on file  . Attends Banker Meetings: Not on file  . Marital Status: Not on file    Allergies:  Allergies  Allergen Reactions  . Erythromycin Hives  . Tetracyclines & Related Hives    Metabolic Disorder Labs: No results found for: HGBA1C, MPG No results found for: PROLACTIN No results found for: CHOL, TRIG, HDL, CHOLHDL, VLDL, LDLCALC Lab Results  Component Value Date   TSH 2.360 05/03/2016    Therapeutic Level Labs: No results  found for: LITHIUM No results found for: VALPROATE No components found for:  CBMZ  Current Medications: Current Outpatient Medications  Medication Sig Dispense Refill  . buPROPion (WELLBUTRIN XL) 150 MG 24 hr tablet Take 300 mg daily and 150 mg daily (total of 450 mg) 90 tablet 1  . buPROPion (WELLBUTRIN XL) 300 MG 24 hr tablet Take 300 mg daily and 150 mg daily (total of 450 mg) 90 tablet 1  . FLUoxetine (PROZAC) 40 MG capsule Take 2 capsules (80 mg total) by mouth daily. 180 capsule 1  . lansoprazole (PREVACID) 15 MG capsule Take 15 mg by mouth daily at 12 noon.    Marland Kitchen LORazepam (ATIVAN) 1 MG tablet Take 1 tablet (1 mg total) by mouth daily as needed for anxiety. 30 tablet 0  . Lurasidone HCl 60 MG TABS Take 1 tablet (60 mg total) by mouth daily with breakfast. 90 tablet 1  . meloxicam (MOBIC) 15 MG tablet Take 1 tablet (15 mg total) by mouth daily. 90 tablet 2  . metoprolol succinate (TOPROL-XL) 50 MG 24 hr tablet Take with or immediately following a meal. 90 tablet 3  . vitamin B-12 (CYANOCOBALAMIN) 1000 MCG tablet Take 5,000 mcg by mouth daily.     . Vitamin D, Cholecalciferol, 1000 UNITS TABS Take 5,000 Units by mouth daily.      No current facility-administered medications for this visit.       Psychiatric Specialty Exam: Review of Systems  There were no vitals taken for this visit.There is no height or weight on file to calculate BMI.  General Appearance: Well Groomed, wearing work uniform  Eye Contact:  Good  Speech:  Clear and Coherent and Normal Rate  Volume:  Normal  Mood:  Anxious  Affect:  Congruent  Thought Process:  Goal Directed, Linear and Descriptions of Associations: Intact  Orientation:  Full (Time, Place, and Person)  Thought Content: Logical   Suicidal Thoughts:  No  Homicidal Thoughts:  No  Memory:  Recent;   Good Remote;   Good  Judgement:  Good  Insight:  Good  Psychomotor Activity:  Normal  Concentration:  Concentration: Good and Attention Span:  Good  Recall:  Good  Fund of Knowledge: Good  Language: Good  Akathisia:  Negative  Handed:  Right  AIMS (if indicated): not done  Assets:  Communication Skills Desire for Improvement Financial Resources/Insurance Housing  ADL's:  Intact  Cognition: WNL  Sleep:  Fair     Screenings: GAD-7     Office Visit from 06/20/2019 in [facility] Office Visit from 05/09/2018 in [facility]  Total GAD-7 Score  21  21    PHQ2-9     Office Visit from 06/20/2019 in [place] Visit from 05/09/2018 in [facility] Office Visit from 05/03/2016 in [facility] Office Visit from 04/07/2016 in [facility] Office Visit from 03/01/2016 in [place]  PHQ-2 Total Score  6  4  6  3  6   PHQ-9 Total Score  15  11  18  13  19        Assessment and Plan: 49 year old female with history of MDD now seen for follow-up.  Patient is undergoing stressful event of custody battle with her ex for the custody of her 28 year old daughter in [place].  She has to drive down to [place] for a hearing in the court next week. Will continue the same regimen for now.  1. MDD (major depressive disorder), recurrent, in partial remission (HCC)  - buPROPion (WELLBUTRIN XL) 150 MG 24 hr tablet; Take 300 mg daily and 150 mg daily (total of 450 mg)  Dispense: 90 tablet; Refill: 0 - buPROPion (WELLBUTRIN XL) 300 MG 24 hr tablet; Take 300 mg daily and 150 mg daily (total of 450 mg)  Dispense: 90 tablet; Refill: 0 - FLUoxetine (PROZAC) 40 MG capsule; Take 2 capsules (80 mg total) by mouth daily.  Dispense: 180 capsule; Refill: 0 - Lurasidone HCl 60 MG TABS; Take 1 tablet (60 mg total) by mouth daily with breakfast.  Dispense: 90 tablet; Refill: 0  2. Anxiety  - FLUoxetine (PROZAC) 40 MG capsule; Take 2 capsules (80 mg total) by mouth daily.  Dispense: 180 capsule; Refill: 0 - LORazepam (ATIVAN) 1 MG tablet; Take 1 tablet (1 mg total) by  mouth daily as needed for anxiety.  Dispense: 30 tablet; Refill: 1  Continue same medication regimen. Follow up in 2 months.   [place] Crane, MD 08/30/2019, 8:42 AM

## 2019-10-24 NOTE — Progress Notes (Deleted)
BH MD/PA/NP OP Progress Note  10/24/2019 3:23 PM Tracie James  MRN:  [id number]  Chief Complaint:  HPI: *** Visit Diagnosis: No diagnosis found.  Past Psychiatric History: Please see initial evaluation for full details. I have reviewed the history. No updates at this time.     Past Medical History:  Past Medical History:  Diagnosis Date  . Anxiety   . Asthma   . Bipolar 2 disorder ([place])   . Depression   . GERD (gastroesophageal reflux disease)   . Palpitations   . PVC (premature ventricular contraction)     Past Surgical History:  Procedure Laterality Date  . ABDOMINAL HYSTERECTOMY    . APPENDECTOMY      Family Psychiatric History: Please see initial evaluation for full details. I have reviewed the history. No updates at this time.     Family History:  Family History  Problem Relation Age of Onset  . Depression Mother   . Multiple sclerosis Mother   . Schizophrenia Maternal Grandmother   . Heart disease Maternal Grandmother        aortic valve replacement    Social History:  Social History   Socioeconomic History  . Marital status: Divorced    Spouse name: Not on file  . Number of children: Not on file  . Years of education: Not on file  . Highest education level: Not on file  Occupational History  . Not on file  Tobacco Use  . Smoking status: Former Smoker    Quit date: 03/31/2014    Years since quitting: 5.5  . Smokeless tobacco: Never Used  Substance and Sexual Activity  . Alcohol use: No  . Drug use: No  . Sexual activity: Yes    Partners: Male    Birth control/protection: Surgical  Other Topics Concern  . Not on file  Social History Narrative  . Not on file   Social Determinants of Health   Financial Resource Strain:   . Difficulty of Paying Living Expenses:   Food Insecurity:   . Worried About Charity fundraiser in the Last Year:   . Arboriculturist in the Last Year:   Transportation Needs:   . Film/video editor (Medical):    Marland Kitchen Lack of Transportation (Non-Medical):   Physical Activity:   . Days of Exercise per Week:   . Minutes of Exercise per Session:   Stress:   . Feeling of Stress :   Social Connections:   . Frequency of Communication with Friends and Family:   . Frequency of Social Gatherings with Friends and Family:   . Attends Religious Services:   . Active Member of Clubs or Organizations:   . Attends Archivist Meetings:   Marland Kitchen Marital Status:     Allergies:  Allergies  Allergen Reactions  . Erythromycin Hives  . Tetracyclines & Related Hives    Metabolic Disorder Labs: No results found for: HGBA1C, MPG No results found for: PROLACTIN No results found for: CHOL, TRIG, HDL, CHOLHDL, VLDL, LDLCALC Lab Results  Component Value Date   TSH 2.360 05/03/2016    Therapeutic Level Labs: No results found for: LITHIUM No results found for: VALPROATE No components found for:  CBMZ  Current Medications: Current Outpatient Medications  Medication Sig Dispense Refill  . buPROPion (WELLBUTRIN XL) 150 MG 24 hr tablet Take 300 mg daily and 150 mg daily (total of 450 mg) 90 tablet 0  . buPROPion (WELLBUTRIN XL) 300 MG 24 hr tablet Take  300 mg daily and 150 mg daily (total of 450 mg) 90 tablet 0  . FLUoxetine (PROZAC) 40 MG capsule Take 2 capsules (80 mg total) by mouth daily. 180 capsule 0  . lansoprazole (PREVACID) 15 MG capsule Take 15 mg by mouth daily at 12 noon.    Marland Kitchen LORazepam (ATIVAN) 1 MG tablet Take 1 tablet (1 mg total) by mouth daily as needed for anxiety. 30 tablet 1  . Lurasidone HCl 60 MG TABS Take 1 tablet (60 mg total) by mouth daily with breakfast. 90 tablet 0  . meloxicam (MOBIC) 15 MG tablet Take 1 tablet (15 mg total) by mouth daily. 90 tablet 2  . metoprolol succinate (TOPROL-XL) 50 MG 24 hr tablet Take with or immediately following a meal. 90 tablet 3  . vitamin B-12 (CYANOCOBALAMIN) 1000 MCG tablet Take 5,000 mcg by mouth daily.     . Vitamin D, Cholecalciferol, 1000  UNITS TABS Take 5,000 Units by mouth daily.      No current facility-administered medications for this visit.     Musculoskeletal: Strength & Muscle Tone: N/A Gait & Station: N/A Patient leans: N/A  Psychiatric Specialty Exam: Review of Systems  There were no vitals taken for this visit.There is no height or weight on file to calculate BMI.  General Appearance: {Appearance:22683}  Eye Contact:  {BHH EYE CONTACT:22684}  Speech:  Clear and Coherent  Volume:  Normal  Mood:  {BHH MOOD:22306}  Affect:  {Affect (PAA):22687}  Thought Process:  Coherent  Orientation:  Full (Time, Place, and Person)  Thought Content: Logical   Suicidal Thoughts:  {ST/HT (PAA):22692}  Homicidal Thoughts:  {ST/HT (PAA):22692}  Memory:  Immediate;   Good  Judgement:  {Judgement (PAA):22694}  Insight:  {Insight (PAA):22695}  Psychomotor Activity:  Normal  Concentration:  Concentration: Good and Attention Span: Good  Recall:  Good  Fund of Knowledge: Good  Language: Good  Akathisia:  No  Handed:  Right  AIMS (if indicated): not done  Assets:  Communication Skills Desire for Improvement  ADL's:  Intact  Cognition: WNL  Sleep:  {B[facility] GOOD/FAIR/POOR:22877}   Screenings: GAD-7     Office Visit from 06/20/2019 in [facility] Office Visit from 05/09/2018 in [facility]  Total GAD-7 Score  21  21    PHQ2-9     Office Visit from 06/20/2019 in [facility] Office Visit from 05/09/2018 in [facility] Office Visit from 05/03/2016 in [facility] Office Visit from 04/07/2016 in [facility] Office Visit from 03/01/2016 in [facility]  PHQ-2 Total Score  6  4  6  3  6   PHQ-9 Total Score  15  11  18  13  19        Assessment and Plan:  Tracie James is a 49 y.o. year old female with a history of depression, who presents for follow up appointment for No diagnosis found.  # MDD. Mild,  recurrent without psychotic  features  She reports slight worsening in depressive symptoms in the context of not being able to meet with her daughter for her birthday today due to the allegation against the patient from her husband.  Other psychosocial stressors includes loss of her mother with MS in April. Given her recent worsening in mood symptoms are considered situational, will continue her current medication.  We will continue fluoxetine to target depression.  Will continue bupropion as adjunctive treatment for depression.  Will continue duloxetine as adjunctive treatment for depression.  She is aware of possible metabolic side effect.  We will continue clonazepam as needed for anxiety.  Validated her grief.   Plan  1. Continue fluoxetine 80 mg daily 2. ContinueBupropion450 mg daily 3. Continue lurasidone 60 mg daily 4 .Continue lorazepam 1 mg as needed for anxiety (shedeclined refill) 5.Next appointment: in January   The patient demonstrates the following risk factors for suicide: Chronic risk factors for suicide include: psychiatric disorder of depressionand previous suicide attempts of overdosing medication. Acute risk factorsfor suicide include: family or marital conflict. Protective factorsfor this patient include: responsibility to others (children, family), coping skills and hope for the future. Patient is future oriented and agrees to come for next appointment.Considering these factors, the overall suicide risk at this point appears to be low. Patient isappropriate for outpatient followup.  [name], MD 10/24/2019, 3:23 PM

## 2019-10-28 LAB — FECAL OCCULT BLOOD, GUAIAC: Fecal Occult Blood: NEGATIVE

## 2019-10-29 ENCOUNTER — Telehealth (HOSPITAL_COMMUNITY): Payer: Self-pay | Admitting: Psychiatry

## 2019-10-29 ENCOUNTER — Other Ambulatory Visit: Payer: Self-pay

## 2019-10-29 ENCOUNTER — Ambulatory Visit (HOSPITAL_COMMUNITY): Payer: 59 | Admitting: Psychiatry

## 2019-10-29 NOTE — Telephone Encounter (Signed)
Called the patient  twice for appointment scheduled today. The patient did not answer the phone. Left voice message to contact the office.  

## 2019-10-29 NOTE — Progress Notes (Signed)
Virtual Visit via Telephone Note  I connected with Tracie James on 11/01/19 at 12:00 PM EDT by telephone and verified that I am speaking with the correct person using two identifiers.   I discussed the limitations, risks, security and privacy concerns of performing Tracie evaluation and management service by telephone and the availability of in person appointments. I also discussed with the patient that there may be a patient responsible charge related to this service. The patient expressed understanding and agreed to proceed.     I discussed the assessment and treatment plan with the patient. The patient was provided Tracie opportunity to ask questions and all were answered. The patient agreed with the plan and demonstrated Tracie understanding of the instructions.   The patient was advised to call back or seek Tracie in-person evaluation if the symptoms worsen or if the condition fails to improve as anticipated.  I provided 12 minutes of non-face-to-face time during this encounter.   Norman Clay, MD    [facility] MD/PA/NP OP Progress Note  11/01/2019 12:15 PM Tracie James  MRN:  [id number]  Chief Complaint:  Chief Complaint    Depression; Follow-up     HPI:  This is a follow-up appointment for depression.  She states that there has been no change since the last visit.  She feels mad as her husband has been trying to extend the court for custody issues.  She now needs to see a parental counselor, which costs 5,000 dollars. She is planning to visit [place] to meet with her daughter.  Social worker does not need to be present there anymore.  She feels frustrated that she will need to ask him about the schedule, and he makes a final decision about the meeting with her daughter.  She has hypersomnia.  She feels fatigued and depressed at times.  She has fair concentration.  She denies SI.  She feels anxious and tense at times.  She denies panic attacks.  She denies alcohol use.    Visit Diagnosis:   ICD-10-CM   1. MDD (major depressive disorder), recurrent, in partial remission (HCC)  F33.41 buPROPion (WELLBUTRIN XL) 150 MG 24 hr tablet    buPROPion (WELLBUTRIN XL) 300 MG 24 hr tablet    FLUoxetine (PROZAC) 40 MG capsule  2. Anxiety  F41.9 FLUoxetine (PROZAC) 40 MG capsule    LORazepam (ATIVAN) 1 MG tablet    Past Psychiatric History: Please see initial evaluation for full details. I have reviewed the history. No updates at this time.     Past Medical History:  Past Medical History:  Diagnosis Date  . Anxiety   . Asthma   . Bipolar 2 disorder ([place])   . Depression   . GERD (gastroesophageal reflux disease)   . Palpitations   . PVC (premature ventricular contraction)     Past Surgical History:  Procedure Laterality Date  . ABDOMINAL HYSTERECTOMY    . APPENDECTOMY      Family Psychiatric History: Please see initial evaluation for full details. I have reviewed the history. No updates at this time.     Family History:  Family History  Problem Relation Age of Onset  . Depression Mother   . Multiple sclerosis Mother   . Schizophrenia Maternal Grandmother   . Heart disease Maternal Grandmother        aortic valve replacement    Social History:  Social History   Socioeconomic History  . Marital status: Divorced    Spouse name: Not on file  .  Number of children: Not on file  . Years of education: Not on file  . Highest education level: Not on file  Occupational History  . Not on file  Tobacco Use  . Smoking status: Former Smoker    Quit date: 03/31/2014    Years since quitting: 5.5  . Smokeless tobacco: Never Used  Substance and Sexual Activity  . Alcohol use: No  . Drug use: No  . Sexual activity: Yes    Partners: Male    Birth control/protection: Surgical  Other Topics Concern  . Not on file  Social History Narrative  . Not on file   Social Determinants of Health   Financial Resource Strain:   . Difficulty of Paying Living Expenses:   Food  Insecurity:   . Worried About Programme researcher, broadcasting/film/video in the Last Year:   . Barista in the Last Year:   Transportation Needs:   . Freight forwarder (Medical):   Marland Kitchen Lack of Transportation (Non-Medical):   Physical Activity:   . Days of Exercise per Week:   . Minutes of Exercise per Session:   Stress:   . Feeling of Stress :   Social Connections:   . Frequency of Communication with Friends and Family:   . Frequency of Social Gatherings with Friends and Family:   . Attends Religious Services:   . Active Member of Clubs or Organizations:   . Attends Banker Meetings:   Marland Kitchen Marital Status:     Allergies:  Allergies  Allergen Reactions  . Erythromycin Hives  . Tetracyclines & Related Hives    Metabolic Disorder Labs: No results found for: HGBA1C, MPG No results found for: PROLACTIN No results found for: CHOL, TRIG, HDL, CHOLHDL, VLDL, LDLCALC Lab Results  Component Value Date   TSH 2.360 05/03/2016    Therapeutic Level Labs: No results found for: LITHIUM No results found for: VALPROATE No components found for:  CBMZ  Current Medications: Current Outpatient Medications  Medication Sig Dispense Refill  . [START ON 11/27/2019] buPROPion (WELLBUTRIN XL) 150 MG 24 hr tablet Take 300 mg daily and 150 mg daily (total of 450 mg) 90 tablet 0  . [START ON 11/27/2019] buPROPion (WELLBUTRIN XL) 300 MG 24 hr tablet Take 300 mg daily and 150 mg daily (total of 450 mg) 90 tablet 0  . [START ON 11/26/2019] FLUoxetine (PROZAC) 40 MG capsule Take 2 capsules (80 mg total) by mouth daily. 180 capsule 0  . lansoprazole (PREVACID) 15 MG capsule Take 15 mg by mouth daily at 12 noon.    Marland Kitchen LORazepam (ATIVAN) 1 MG tablet Take 1 tablet (1 mg total) by mouth daily as needed for anxiety. 30 tablet 1  . Lurasidone HCl 60 MG TABS Take 1 tablet (60 mg total) by mouth daily with breakfast. 90 tablet 0  . meloxicam (MOBIC) 15 MG tablet Take 1 tablet (15 mg total) by mouth daily. 90 tablet 2   . metoprolol succinate (TOPROL-XL) 50 MG 24 hr tablet Take with or immediately following a meal. 90 tablet 3  . vitamin B-12 (CYANOCOBALAMIN) 1000 MCG tablet Take 5,000 mcg by mouth daily.     . Vitamin D, Cholecalciferol, 1000 UNITS TABS Take 5,000 Units by mouth daily.      No current facility-administered medications for this visit.     Musculoskeletal: Strength & Muscle Tone: N/A Gait & Station: N/A Patient leans: N/A  Psychiatric Specialty Exam: Review of Systems  Psychiatric/Behavioral: Positive for dysphoric mood  and sleep disturbance. Negative for agitation, behavioral problems, confusion, decreased concentration, hallucinations, self-injury and suicidal ideas. The patient is nervous/anxious. The patient is not hyperactive.   All other systems reviewed and are negative.   There were no vitals taken for this visit.There is no height or weight on file to calculate BMI.  General Appearance: NA  Eye Contact:  NA  Speech:  Clear and Coherent  Volume:  Normal  Mood:  Anxious  Affect:  NA  Thought Process:  Coherent  Orientation:  Full (Time, Place, and Person)  Thought Content: Logical   Suicidal Thoughts:  No  Homicidal Thoughts:  No  Memory:  Immediate;   Good  Judgement:  Good  Insight:  Good  Psychomotor Activity:  Normal  Concentration:  Concentration: Good and Attention Span: Good  Recall:  Good  Fund of Knowledge: Good  Language: Good  Akathisia:  No  Handed:  Right  AIMS (if indicated): not done  Assets:  Communication Skills Desire for Improvement  ADL's:  Intact  Cognition: WNL  Sleep:  Fair   Screenings: GAD-7     Office Visit from 06/20/2019 in [facility] Office Visit from 05/09/2018 in [facility]  Total GAD-7 Score  21  21    PHQ2-9     Office Visit from 06/20/2019 in [facility] Office Visit from 05/09/2018 in [facility] Office Visit from 05/03/2016 in [facility] Office Visit from  04/07/2016 in [facility] Office Visit from 03/01/2016 in [facility]  PHQ-2 Total Score  6  4  6  3  6   PHQ-9 Total Score  15  11  18  13  19        Assessment and Plan:  Tracie James is a 49 y.o. year old female with a history of depression, who presents for follow up appointment for MDD (major depressive disorder), recurrent, in partial remission (HCC) - Plan: buPROPion (WELLBUTRIN XL) 150 MG 24 hr tablet, buPROPion (WELLBUTRIN XL) 300 MG 24 hr tablet, FLUoxetine (PROZAC) 40 MG capsule  Anxiety - Plan: FLUoxetine (PROZAC) 40 MG capsule, LORazepam (ATIVAN) 1 MG tablet  # MDD, mild, recurrent without psychotic features Although she continues to have depressive symptoms and anxiety in the context of conflict with her ex-husband/custody issues, she has been managing things fairly well.  Other psychosocial stressors includes loss of her mother with MS in April.  We will continue her current medication regimen.  We will continue fluoxetine to target depression.  We will continue bupropion as adjunctive treatment for depression.  We will continue Latuda as adjunctive treatment for depression.  She is aware of its potential metabolic side effect.  Will continue clonazepam as needed for anxiety.  Discussed risk of dependence and oversedation.   Plan I have reviewed and updated plans as below 1. Continue fluoxetine 80 mg daily 2. ContinueBupropion450 mg daily 3. Continue lurasidone 60 mg daily 4 .Continue lorazepam 1 mg as needed for anxiety (shedeclined refill) 5.Next appointment:7/12 at 11 AM for 20 mins, phone   The patient demonstrates the following risk factors for suicide: Chronic risk factors for suicide include: psychiatric disorder of depressionand previous suicide attempts of overdosing medication. Acute risk factorsfor suicide include: family or marital conflict. Protective factorsfor this patient include: responsibility to others (children, family),  coping skills and hope for the future. Patient is future oriented and agrees to come for next appointment.Considering these factors, the overall suicide risk at this point appears to be  low. Patient isappropriate for outpatient followup.  [name], MD 11/01/2019, 12:15 PM

## 2019-11-01 ENCOUNTER — Other Ambulatory Visit: Payer: Self-pay

## 2019-11-01 ENCOUNTER — Encounter (HOSPITAL_COMMUNITY): Payer: Self-pay | Admitting: Psychiatry

## 2019-11-01 ENCOUNTER — Ambulatory Visit (INDEPENDENT_AMBULATORY_CARE_PROVIDER_SITE_OTHER): Payer: 59 | Admitting: Psychiatry

## 2019-11-01 DIAGNOSIS — F3341 Major depressive disorder, recurrent, in partial remission: Secondary | ICD-10-CM

## 2019-11-01 DIAGNOSIS — F419 Anxiety disorder, unspecified: Secondary | ICD-10-CM | POA: Diagnosis not present

## 2019-11-01 MED ORDER — LORAZEPAM 1 MG PO TABS: 1.0000 mg | ORAL_TABLET | Freq: Every day | ORAL | 1 refills | Status: DC | PRN

## 2019-11-01 MED ORDER — BUPROPION HCL ER (XL) 300 MG PO TB24: ORAL_TABLET | ORAL | 0 refills | Status: DC

## 2019-11-01 MED ORDER — BUPROPION HCL ER (XL) 150 MG PO TB24: ORAL_TABLET | ORAL | 0 refills | Status: DC

## 2019-11-01 MED ORDER — FLUOXETINE HCL 40 MG PO CAPS: 80.0000 mg | ORAL_CAPSULE | Freq: Every day | ORAL | 0 refills | Status: DC

## 2019-12-03 ENCOUNTER — Telehealth (HOSPITAL_COMMUNITY): Payer: Self-pay

## 2019-12-03 NOTE — Telephone Encounter (Signed)
She might be having some sleep condition. I would advise her to contact PCP for further evaluation. Please advise her to hold lorazepam if she has hypersomnia.

## 2019-12-03 NOTE — Telephone Encounter (Signed)
Patient called and stated that her symptom of constantly falling asleep is coming back. She stated that she can't keep her eyes open when she's going to work. She wants to know if she needs a medication adjustment and if she should contact her PCP? Please review and advise. Thank you.

## 2019-12-03 NOTE — Telephone Encounter (Signed)
Spoke with Patient & Advised Per Provider : She might be having some sleep condition. I would advise her to contact PCP for further evaluation. Please advise her to hold lorazepam if she has hypersomnia.

## 2019-12-07 ENCOUNTER — Ambulatory Visit (INDEPENDENT_AMBULATORY_CARE_PROVIDER_SITE_OTHER): Payer: 59 | Admitting: Nurse Practitioner

## 2019-12-07 ENCOUNTER — Encounter: Payer: Self-pay | Admitting: Nurse Practitioner

## 2019-12-07 ENCOUNTER — Other Ambulatory Visit: Payer: Self-pay

## 2019-12-07 VITALS — BP 136/80 | HR 76 | Temp 98.0°F | Wt 229.2 lb

## 2019-12-07 DIAGNOSIS — R52 Pain, unspecified: Secondary | ICD-10-CM | POA: Diagnosis not present

## 2019-12-07 DIAGNOSIS — R5383 Other fatigue: Secondary | ICD-10-CM | POA: Insufficient documentation

## 2019-12-07 MED ORDER — GABAPENTIN 300 MG PO CAPS: 300.0000 mg | ORAL_CAPSULE | Freq: Every day | ORAL | 3 refills | Status: DC

## 2019-12-07 MED ORDER — GABAPENTIN 300 MG PO CAPS: 300.0000 mg | ORAL_CAPSULE | Freq: Three times a day (TID) | ORAL | 3 refills | Status: DC
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
## 2019-12-07 NOTE — Patient Instructions (Signed)
Myofascial Pain Syndrome and Fibromyalgia Myofascial pain syndrome and fibromyalgia are both pain disorders. This pain may be felt mainly in your muscles.  Myofascial pain syndrome: ? Always has tender points in the muscle that will cause pain when pressed (trigger points). The pain may come and go. ? Usually affects your neck, upper back, and shoulder areas. The pain often radiates into your arms and hands.  Fibromyalgia: ? Has muscle pains and tenderness that come and go. ? Is often associated with fatigue and sleep problems. ? Has trigger points. ? Tends to be long-lasting (chronic), but is not life-threatening. Fibromyalgia and myofascial pain syndrome are not the same. However, they often occur together. If you have both conditions, each can make the other worse. Both are common and can cause enough pain and fatigue to make day-to-day activities difficult. Both can be hard to diagnose because their symptoms are common in many other conditions. What are the causes? The exact causes of these conditions are not known. What increases the risk? You are more likely to develop this condition if:  You have a family history of the condition.  You have certain triggers, such as: ? Spine disorders. ? An injury (trauma) or other physical stressors. ? Being under a lot of stress. ? Medical conditions such as osteoarthritis, rheumatoid arthritis, or lupus. What are the signs or symptoms? Fibromyalgia The main symptom of fibromyalgia is widespread pain and tenderness in your muscles. Pain is sometimes described as stabbing, shooting, or burning. You may also have:  Tingling or numbness.  Sleep problems and fatigue.  Problems with attention and concentration (fibro fog). Other symptoms may include:  Bowel and bladder problems.  Headaches.  Visual problems.  Problems with odors and noises.  Depression or mood changes.  Painful menstrual periods (dysmenorrhea).  Dry skin or  eyes. These symptoms can vary over time. Myofascial pain syndrome Symptoms of myofascial pain syndrome include:  Tight, ropy bands of muscle.  Uncomfortable sensations in muscle areas. These may include aching, cramping, burning, numbness, tingling, and weakness.  Difficulty moving certain parts of the body freely (poor range of motion). How is this diagnosed? This condition may be diagnosed by your symptoms and medical history. You will also have a physical exam. In general:  Fibromyalgia is diagnosed if you have pain, fatigue, and other symptoms for more than 3 months, and symptoms cannot be explained by another condition.  Myofascial pain syndrome is diagnosed if you have trigger points in your muscles, and those trigger points are tender and cause pain elsewhere in your body (referred pain). How is this treated? Treatment for these conditions depends on the type that you have.  For fibromyalgia: ? Pain medicines, such as NSAIDs. ? Medicines for treating depression. ? Medicines for treating seizures. ? Medicines that relax the muscles.  For myofascial pain: ? Pain medicines, such as NSAIDs. ? Cooling and stretching of muscles. ? Trigger point injections. ? Sound wave (ultrasound) treatments to stimulate muscles. Treating these conditions often requires a team of health care providers. These may include:  Your primary care provider.  Physical therapist.  Complementary health care providers, such as massage therapists or acupuncturists.  Psychiatrist for cognitive behavioral therapy. Follow these instructions at home: Medicines  Take over-the-counter and prescription medicines only as told by your health care provider.  Do not drive or use heavy machinery while taking prescription pain medicine.  If you are taking prescription pain medicine, take actions to prevent or treat constipation. Your health care   provider may recommend that you: ? Drink enough fluid to keep  your urine pale yellow. ? Eat foods that are high in fiber, such as fresh fruits and vegetables, whole grains, and beans. ? Limit foods that are high in fat and processed sugars, such as fried or sweet foods. ? Take an over-the-counter or prescription medicine for constipation. Lifestyle   Exercise as directed by your health care provider or physical therapist.  Practice relaxation techniques to control your stress. You may want to try: ? Biofeedback. ? Visual imagery. ? Hypnosis. ? Muscle relaxation. ? Yoga. ? Meditation.  Maintain a healthy lifestyle. This includes eating a healthy diet and getting enough sleep.  Do not use any products that contain nicotine or tobacco, such as cigarettes and e-cigarettes. If you need help quitting, ask your health care provider. General instructions  Talk to your health care provider about complementary treatments, such as acupuncture or massage.  Consider joining a support group with others who are diagnosed with this condition.  Do not do activities that stress or strain your muscles. This includes repetitive motions and heavy lifting.  Keep all follow-up visits as told by your health care provider. This is important. Where to find more information  National Fibromyalgia Association: www.fmaware.org  Arthritis Foundation: www.arthritis.org  American Chronic Pain Association: www.theacpa.org Contact a health care provider if:  You have new symptoms.  Your symptoms get worse or your pain is severe.  You have side effects from your medicines.  You have trouble sleeping.  Your condition is causing depression or anxiety. Summary  Myofascial pain syndrome and fibromyalgia are pain disorders.  Myofascial pain syndrome has tender points in the muscle that will cause pain when pressed (trigger points). Fibromyalgia also has muscle pains and tenderness that come and go, but this condition is often associated with fatigue and sleep  disturbances.  Fibromyalgia and myofascial pain syndrome are not the same but often occur together, causing pain and fatigue that make day-to-day activities difficult.  Treatment for fibromyalgia includes taking medicines to relax the muscles and medicines for pain, depression, or seizures. Treatment for myofascial pain syndrome includes taking medicines for pain, cooling and stretching of muscles, and injecting medicines into trigger points.  Follow your health care provider's instructions for taking medicines and maintaining a healthy lifestyle. This information is not intended to replace advice given to you by your health care provider. Make sure you discuss any questions you have with your health care provider. Document Revised: 10/27/2018 Document Reviewed: 07/20/2017 Elsevier Patient Education  2020 [employer]  

## 2019-12-07 NOTE — Assessment & Plan Note (Signed)
Ongoing for several years with current exacerbation of symptoms, she is hyper sensitive to light touch to multiple areas and WPI score 8.  Suspect some underlying fibromyalgia, will reach out to psychiatry to see if possibility to add on something like Duloxetine or Amitriptyline to current mood regimen.  Labs today to include CRP, ESR, ANA, CMP, CBC, TSH, and B12.  Will trial low dose of Gabapentin at HS only, 300 MG.  Script sent in.  Return in one week for follow-up and recommend minimizing driving at this time due to symptoms.

## 2019-12-07 NOTE — Assessment & Plan Note (Addendum)
Ongoing for several years with current exacerbation of symptoms to include multiple areas of pain and fatigue, she is hyper sensitive to light touch to multiple areas and WPI score 8.  Suspect some underlying fibromyalgia, will reach out to psychiatry to see if possibility to add on something like Duloxetine or Amitriptyline to current mood regimen.  Labs today to include CRP, ESR, ANA, CMP, CBC, TSH, and B12.  Will trial low dose of Gabapentin at HS only, 300 MG.  Script sent in.  Return in one week for follow-up and recommend minimizing driving at this time due to symptoms.  For worsening symptoms immediately call office or report to ER.  Could also consider sleep study in future.

## 2019-12-08 LAB — CBC WITH DIFFERENTIAL/PLATELET
Basophils Absolute: 0.1 10*3/uL (ref 0.0–0.2)
Basos: 1 %
EOS (ABSOLUTE): 0.1 10*3/uL (ref 0.0–0.4)
Eos: 2 %
Hematocrit: 40.2 % (ref 34.0–46.6)
Hemoglobin: 13.6 g/dL (ref 11.1–15.9)
Immature Grans (Abs): 0.1 10*3/uL (ref 0.0–0.1)
Immature Granulocytes: 1 %
Lymphocytes Absolute: 2 10*3/uL (ref 0.7–3.1)
Lymphs: 24 %
MCH: 28.9 pg (ref 26.6–33.0)
MCHC: 33.8 g/dL (ref 31.5–35.7)
MCV: 85 fL (ref 79–97)
Monocytes Absolute: 0.6 10*3/uL (ref 0.1–0.9)
Monocytes: 7 %
Neutrophils Absolute: 5.5 10*3/uL (ref 1.4–7.0)
Neutrophils: 65 %
Platelets: 269 10*3/uL (ref 150–450)
RBC: 4.71 x10E6/uL (ref 3.77–5.28)
RDW: 13.6 % (ref 11.7–15.4)
WBC: 8.3 10*3/uL (ref 3.4–10.8)

## 2019-12-08 LAB — COMPREHENSIVE METABOLIC PANEL
ALT: 30 IU/L (ref 0–32)
AST: 28 IU/L (ref 0–40)
Albumin/Globulin Ratio: 1.8 (ref 1.2–2.2)
Albumin: 4.5 g/dL (ref 3.8–4.8)
Alkaline Phosphatase: 92 IU/L (ref 48–121)
BUN/Creatinine Ratio: 13 (ref 9–23)
BUN: 16 mg/dL (ref 6–24)
Bilirubin Total: 0.5 mg/dL (ref 0.0–1.2)
CO2: 24 mmol/L (ref 20–29)
Calcium: 9.8 mg/dL (ref 8.7–10.2)
Chloride: 98 mmol/L (ref 96–106)
Creatinine, Ser: 1.21 mg/dL — ABNORMAL HIGH (ref 0.57–1.00)
GFR calc Af Amer: 60 mL/min/{1.73_m2} (ref >59)
GFR calc non Af Amer: 52 mL/min/{1.73_m2} — ABNORMAL LOW (ref >59)
Globulin, Total: 2.5 g/dL (ref 1.5–4.5)
Glucose: 71 mg/dL (ref 65–99)
Potassium: 4.5 mmol/L (ref 3.5–5.2)
Sodium: 136 mmol/L (ref 134–144)
Total Protein: 7 g/dL (ref 6.0–8.5)

## 2019-12-08 LAB — TSH: TSH: 2.1 u[IU]/mL (ref 0.450–4.500)

## 2019-12-08 LAB — C-REACTIVE PROTEIN: CRP: 3 mg/L (ref 0–10)

## 2019-12-08 LAB — ANA W/REFLEX IF POSITIVE: Anti Nuclear Antibody (ANA): NEGATIVE

## 2019-12-08 LAB — VITAMIN B12: Vitamin B-12: >2000 pg/mL — ABNORMAL HIGH (ref 232–1245)

## 2019-12-08 LAB — SEDIMENTATION RATE: Sed Rate: 12 mm/hr (ref 0–40)

## 2019-12-09 NOTE — Progress Notes (Signed)
Contacted via MyChartGood afternoon Tracie James, your labs have returned: - Overall inflammatory testing, for things like rheumatoid arthritis, returned negative or in normal ranges. - CBC shows no anemia, B12 level is normal - You have a little kidney disease noted on these labs with creatinine 1.21 and GFR 52, I would recommend increasing your fluid intake at this time and we wil continue to monitor this. - thyroid testing normal How is the Gabapentin?  Let me know. Keep being awesome!! Kindest regards, Jachob Mcclean

## 2019-12-29 ENCOUNTER — Other Ambulatory Visit: Payer: Self-pay | Admitting: Psychiatry

## 2019-12-29 DIAGNOSIS — F3341 Major depressive disorder, recurrent, in partial remission: Secondary | ICD-10-CM

## 2019-12-30 ENCOUNTER — Other Ambulatory Visit: Payer: Self-pay | Admitting: Psychiatry

## 2019-12-30 DIAGNOSIS — F3341 Major depressive disorder, recurrent, in partial remission: Secondary | ICD-10-CM

## 2020-01-01 ENCOUNTER — Encounter: Payer: Self-pay | Admitting: Nurse Practitioner

## 2020-01-01 ENCOUNTER — Ambulatory Visit (INDEPENDENT_AMBULATORY_CARE_PROVIDER_SITE_OTHER): Payer: 59 | Admitting: Nurse Practitioner

## 2020-01-01 ENCOUNTER — Other Ambulatory Visit: Payer: Self-pay

## 2020-01-01 VITALS — BP 123/76 | HR 67 | Temp 97.7°F | Wt 236.6 lb

## 2020-01-01 DIAGNOSIS — R5383 Other fatigue: Secondary | ICD-10-CM

## 2020-01-01 DIAGNOSIS — Z6841 Body Mass Index (BMI) 40.0 and over, adult: Secondary | ICD-10-CM

## 2020-01-01 DIAGNOSIS — F331 Major depressive disorder, recurrent, moderate: Secondary | ICD-10-CM

## 2020-01-01 DIAGNOSIS — F419 Anxiety disorder, unspecified: Secondary | ICD-10-CM

## 2020-01-01 DIAGNOSIS — R52 Pain, unspecified: Secondary | ICD-10-CM | POA: Diagnosis not present

## 2020-01-01 NOTE — Assessment & Plan Note (Signed)
Improving with addition of Gabapentin at last visit, ?element of fibromyalgia involved.  Recent labs unremarkable.  Continue Gabapentin and adjust dose as needed.  Return in 3 months.

## 2020-01-01 NOTE — Assessment & Plan Note (Signed)
Recommended eating smaller high protein, low fat meals more frequently and exercising 30 mins a day 5 times a week with a goal of 10-15lb weight loss in the next 3 months. Patient voiced their understanding and motivation to adhere to these recommendations.  

## 2020-01-01 NOTE — Assessment & Plan Note (Addendum)
Chronic.  Followed by psychiatry, continue this collaboration and current medication regimen at this time.  She denies SI/HI today.  PHQ9 score increased from 15 to 23 this visit.  She endorses significant fatigue and multiple stressors in life.  Would benefit from therapy, but does not wish to return.  Recommend she call psychiatry first thing in morning and schedule to follow-up sooner then July 16th due to current mood and increased fatigue.  She verbally agrees to [employer] -- has support system locally and will reach out to them if SI presents or immediately go to ER.  At this time she reports being safe.  Return in 3 months for follow-up.

## 2020-01-01 NOTE — Patient Instructions (Signed)
Living With Depression Everyone experiences occasional disappointment, sadness, and loss in their lives. When you are feeling down, blue, or sad for at least 2 weeks in a row, it may mean that you have depression. Depression can affect your thoughts and feelings, relationships, daily activities, and physical health. It is caused by changes in the way your brain functions. If you receive a diagnosis of depression, your health care provider will tell you which type of depression you have and what treatment options are available to you. If you are living with depression, there are ways to help you recover from it and also ways to prevent it from coming back. How to cope with lifestyle changes Coping with stress     Stress is your body's reaction to life changes and events, both good and bad. Stressful situations may include:  Getting married.  The death of a spouse.  Losing a job.  Retiring.  Having a baby. Stress can last just a few hours or it can be ongoing. Stress can play a major role in depression, so it is important to learn both how to cope with stress and how to think about it differently. Talk with your health care provider or a counselor if you would like to learn more about stress reduction. He or she may suggest some stress reduction techniques, such as:  Music therapy. This can include creating music or listening to music. Choose music that you enjoy and that inspires you.  Mindfulness-based meditation. This kind of meditation can be done while sitting or walking. It involves being aware of your normal breaths, rather than trying to control your breathing.  Centering prayer. This is a kind of meditation that involves focusing on a spiritual word or phrase. Choose a word, phrase, or sacred image that is meaningful to you and that brings you peace.  Deep breathing. To do this, expand your stomach and inhale slowly through your nose. Hold your breath for 3-5 seconds, then exhale  slowly, allowing your stomach muscles to relax.  Muscle relaxation. This involves intentionally tensing muscles then relaxing them. Choose a stress reduction technique that fits your lifestyle and personality. Stress reduction techniques take time and practice to develop. Set aside 5-15 minutes a day to do them. Therapists can offer training in these techniques. The training may be covered by some insurance plans. Other things you can do to manage stress include:  Keeping a stress diary. This can help you learn what triggers your stress and ways to control your response.  Understanding what your limits are and saying no to requests or events that lead to a schedule that is too full.  Thinking about how you respond to certain situations. You may not be able to control everything, but you can control how you react.  Adding humor to your life by watching funny films or TV shows.  Making time for activities that help you relax and not feeling guilty about spending your time this way.  Medicines Your health care provider may suggest certain medicines if he or she feels that they will help improve your condition. Avoid using alcohol and other substances that may prevent your medicines from working properly (may interact). It is also important to:  Talk with your pharmacist or health care provider about all the medicines that you take, their possible side effects, and what medicines are safe to take together.  Make it your goal to take part in all treatment decisions (shared decision-making). This includes giving input on   the side effects of medicines. It is best if shared decision-making with your health care provider is part of your total treatment plan. If your health care provider prescribes a medicine, you may not notice the full benefits of it for 4-8 weeks. Most people who are treated for depression need to be on medicine for at least 6-12 months after they feel better. If you are taking  medicines as part of your treatment, do not stop taking medicines without first talking to your health care provider. You may need to have the medicine slowly decreased (tapered) over time to decrease the risk of harmful side effects. Relationships Your health care provider may suggest family therapy along with individual therapy and drug therapy. While there may not be family problems that are causing you to feel depressed, it is still important to make sure your family learns as much as they can about your mental health. Having your family's support can help make your treatment successful. How to recognize changes in your condition Everyone has a different response to treatment for depression. Recovery from major depression happens when you have not had signs of major depression for two months. This may mean that you will start to:  Have more interest in doing activities.  Feel less hopeless than you did 2 months ago.  Have more energy.  Overeat less often, or have better or improving appetite.  Have better concentration. Your health care provider will work with you to decide the next steps in your recovery. It is also important to recognize when your condition is getting worse. Watch for these signs:  Having fatigue or low energy.  Eating too much or too little.  Sleeping too much or too little.  Feeling restless, agitated, or hopeless.  Having trouble concentrating or making decisions.  Having unexplained physical complaints.  Feeling irritable, angry, or aggressive. Get help as soon as you or your family members notice these symptoms coming back. How to get support and help from others How to talk with friends and family members about your condition  Talking to friends and family members about your condition can provide you with one way to get support and guidance. Reach out to trusted friends or family members, explain your symptoms to them, and let them know that you are  working with a health care provider to treat your depression. Financial resources Not all insurance plans cover mental health care, so it is important to check with your insurance carrier. If paying for co-pays or counseling services is a problem, search for a local or county mental health care center. They may be able to offer public mental health care services at low or no cost when you are not able to see a private health care provider. If you are taking medicine for depression, you may be able to get the generic form, which may be less expensive. Some makers of prescription medicines also offer help to patients who cannot afford the medicines they need. Follow these instructions at home:   Get the right amount and quality of sleep.  Cut down on using caffeine, tobacco, alcohol, and other potentially harmful substances.  Try to exercise, such as walking or lifting small weights.  Take over-the-counter and prescription medicines only as told by your health care provider.  Eat a healthy diet that includes plenty of vegetables, fruits, whole grains, low-fat dairy products, and lean protein. Do not eat a lot of foods that are high in solid fats, added sugars, or salt.    Keep all follow-up visits as told by your health care provider. This is important. Contact a health care provider if:  You stop taking your antidepressant medicines, and you have any of these symptoms: ? Nausea. ? Headache. ? Feeling lightheaded. ? Chills and body aches. ? Not being able to sleep (insomnia).  You or your friends and family think your depression is getting worse. Get help right away if:  You have thoughts of hurting yourself or others. If you ever feel like you may hurt yourself or others, or have thoughts about taking your own life, get help right away. You can go to your nearest emergency department or call:  Your local emergency services (911 in the [place]  A suicide crisis helpline, such as the  National Suicide Prevention Lifeline at 1-800-273-8255. This is open 24-hours a day. Summary  If you are living with depression, there are ways to help you recover from it and also ways to prevent it from coming back.  Work with your health care team to create a management plan that includes counseling, stress management techniques, and healthy lifestyle habits. This information is not intended to replace advice given to you by your health care provider. Make sure you discuss any questions you have with your health care provider. Document Revised: 10/27/2018 Document Reviewed: 06/07/2016 Elsevier Patient Education  2020 [employer]  

## 2020-01-01 NOTE — Progress Notes (Signed)
 BP 123/76   Pulse 67   Temp 97.7 F (36.5 C) (Oral)   Wt 236 lb 9.6 oz (107.3 kg)   LMP  (LMP Unknown)   SpO2 97%   BMI 41.91 kg/m    Subjective:    Patient ID: Tracie James, female    DOB: 12/08/1970, 48 y.o.   MRN: [id number]  HPI: Tracie James is a 48 y.o. female  Chief Complaint  Patient presents with  . Anxiety  . Depression   ANXIETY/STRESS/DEPRESSION Continues on Wellbutrin, Fluoxetine, Lurasidone, and Ativan (which she only uses when needed and reports this is not often). Followed by psychiatry, sees every 3 months and scheduled to follow-up with them July 16th.  States current regimen works well.  She reports ongoing fatigue, but improvement in overall body aches reported at last visit with Gabapentin.  Had fatigue prior to Covid, had Covid in beginning of December, but has become worse over past months.  Denies any SOB, CP, palpitations, edema.  No recent tick bites.  She is going through custody battle for child (has a 14 yo and 20 yo son) which is causing increased stress.  Lost mother one year ago -- her mother had depression and took Zoloft for mood.  She also endorses financial stressors with paying legal fees and hours that custody battle has taken.  She denies any suicide plan, but does endorse fleeting thoughts that life may be better if she were not here, however has her children to be here for.  Has a support system nearby if SI presents.  Denies SI/HI today.  Has gone to multiple therapists in past, but feels this did not help.  Duration:stable Anxious mood: occasional  Excessive worrying: no Irritability: no  Sweating: no Nausea: no Palpitations:no Hyperventilation: no Panic attacks: no Agoraphobia: no  Obscessions/compulsions: no Depressed mood: occasional Depression screen PHQ 2/9 01/01/2020 06/20/2019 05/09/2018 05/03/2016 04/07/2016  Decreased Interest 3 3 3 3 2  Down, Depressed, Hopeless 3 3 1 3 1  PHQ - 2 Score 6 6 4 6 3  Altered sleeping 3  0 1 3 2  Tired, decreased energy 3 3 3 3 3  Change in appetite 3 3 3 3 3  Feeling bad or failure about yourself  3 3 0 3 1  Trouble concentrating 2 0 0 0 1  Moving slowly or fidgety/restless 2 0 0 0 0  Suicidal thoughts 1 0 0 0 0  PHQ-9 Score 23 15 11 18 13  Difficult doing work/chores - Not difficult at all Somewhat difficult Somewhat difficult -  Anhedonia: no Weight changes: no Insomnia: none Hypersomnia: no Fatigue/loss of energy: yes Feelings of worthlessness: yes Feelings of guilt: yes Impaired concentration/indecisiveness: yes Suicidal ideations: no  Crying spells: yes Recent Stressors/Life Changes: yes   Relationship problems: yes   Family stress: no     Financial stress: yes    Job stress: no    Recent death/loss: no GAD 7 : Generalized Anxiety Score 01/01/2020 06/20/2019 05/09/2018  Nervous, Anxious, on Edge 3 3 3  Control/stop worrying 3 3 3  Worry too much - different things 3 3 3  Trouble relaxing 3 3 3  Restless 3 3 3  Easily annoyed or irritable 3 3 3  Afraid - awful might happen 3 3 3  Total GAD 7 Score 21 21 21  Anxiety Difficulty Very difficult Not difficult at all Very difficult    Relevant past medical, surgical, family and social history reviewed and updated as   indicated. Interim medical history since our last visit reviewed. Allergies and medications reviewed and updated.  Review of Systems  Constitutional: Positive for fatigue. Negative for activity change, appetite change, diaphoresis and fever.  Respiratory: Negative for cough, chest tightness and shortness of breath.   Cardiovascular: Negative for chest pain, palpitations and leg swelling.  Gastrointestinal: Negative.   Neurological: Negative.   Psychiatric/Behavioral: Positive for decreased concentration. Negative for self-injury and sleep disturbance. The patient is nervous/anxious.     Per HPI unless specifically indicated above     Objective:    BP 123/76   Pulse 67   Temp 97.7 F  (36.5 C) (Oral)   Wt 236 lb 9.6 oz (107.3 kg)   LMP  (LMP Unknown)   SpO2 97%   BMI 41.91 kg/m   Wt Readings from Last 3 Encounters:  01/01/20 236 lb 9.6 oz (107.3 kg)  12/07/19 229 lb 3.2 oz (104 kg)  05/09/18 232 lb 4 oz (105.3 kg)    Physical Exam Vitals and nursing note reviewed.  Constitutional:      General: She is awake. She is not in acute distress.    Appearance: She is well-developed and well-groomed. She is obese. She is not ill-appearing.  HENT:     Head: Normocephalic.     Right Ear: Hearing normal.     Left Ear: Hearing normal.     Nose: Nose normal.  Eyes:     General: Lids are normal.        Right eye: No discharge.        Left eye: No discharge.     Conjunctiva/sclera: Conjunctivae normal.     Pupils: Pupils are equal, round, and reactive to light.  Neck:     Thyroid: No thyromegaly.     Vascular: No carotid bruit.  Cardiovascular:     Rate and Rhythm: Normal rate and regular rhythm.     Heart sounds: Normal heart sounds. No murmur heard.  No gallop.   Pulmonary:     Effort: Pulmonary effort is normal. No accessory muscle usage or respiratory distress.     Breath sounds: Normal breath sounds.  Abdominal:     General: Bowel sounds are normal.     Palpations: Abdomen is soft.  Musculoskeletal:     Cervical back: Normal range of motion and neck supple.     Right lower leg: No edema.     Left lower leg: No edema.  Skin:    General: Skin is warm and dry.  Neurological:     Mental Status: She is alert and oriented to person, place, and time.  Psychiatric:        Attention and Perception: Attention normal.        Mood and Affect: Affect is tearful.        Speech: Speech normal.        Behavior: Behavior normal. Behavior is cooperative.        Thought Content: Thought content normal.     Comments: Very tearful today during visit, especially when discussing current stressors.     Results for orders placed or performed in visit on 12/07/19  ANA  w/Reflex if Positive  Result Value Ref Range   Anti Nuclear Antibody (ANA) Negative Negative  CBC with Differential/Platelet  Result Value Ref Range   WBC 8.3 3.4 - 10.8 x10E3/uL   RBC 4.71 3.77 - 5.28 x10E6/uL   Hemoglobin 13.6 11.1 - 15.9 g/dL   Hematocrit 40.2 34.0 - 46.6 %     MCV 85 79 - 97 fL   MCH 28.9 26.6 - 33.0 pg   MCHC 33.8 31 - 35 g/dL   RDW 13.6 11.7 - 15.4 %   Platelets 269 150 - 450 x10E3/uL   Neutrophils 65 Not Estab. %   Lymphs 24 Not Estab. %   Monocytes 7 Not Estab. %   Eos 2 Not Estab. %   Basos 1 Not Estab. %   Neutrophils Absolute 5.5 1 - 7 x10E3/uL   Lymphocytes Absolute 2.0 0 - 3 x10E3/uL   Monocytes Absolute 0.6 0 - 0 x10E3/uL   EOS (ABSOLUTE) 0.1 0.0 - 0.4 x10E3/uL   Basophils Absolute 0.1 0 - 0 x10E3/uL   Immature Granulocytes 1 Not Estab. %   Immature Grans (Abs) 0.1 0.0 - 0.1 x10E3/uL  Comprehensive metabolic panel  Result Value Ref Range   Glucose 71 65 - 99 mg/dL   BUN 16 6 - 24 mg/dL   Creatinine, Ser 1.21 (H) 0.57 - 1.00 mg/dL   GFR calc non Af Amer 52 (L) >59 mL/min/1.73   GFR calc Af Amer 60 >59 mL/min/1.73   BUN/Creatinine Ratio 13 9 - 23   Sodium 136 134 - 144 mmol/L   Potassium 4.5 3.5 - 5.2 mmol/L   Chloride 98 96 - 106 mmol/L   CO2 24 20 - 29 mmol/L   Calcium 9.8 8.7 - 10.2 mg/dL   Total Protein 7.0 6.0 - 8.5 g/dL   Albumin 4.5 3.8 - 4.8 g/dL   Globulin, Total 2.5 1.5 - 4.5 g/dL   Albumin/Globulin Ratio 1.8 1.2 - 2.2   Bilirubin Total 0.5 0.0 - 1.2 mg/dL   Alkaline Phosphatase 92 48 - 121 IU/L   AST 28 0 - 40 IU/L   ALT 30 0 - 32 IU/L  TSH  Result Value Ref Range   TSH 2.100 0.450 - 4.500 uIU/mL  Vitamin B12  Result Value Ref Range   Vitamin B-12 >2000 (H) 232 - 1245 pg/mL  Sed Rate (ESR)  Result Value Ref Range   Sed Rate 12 0 - 40 mm/hr  C-reactive protein  Result Value Ref Range   CRP 3 0 - 10 mg/L      Assessment & Plan:   Problem List Items Addressed This Visit      Other   Anxiety    Chronic, followed by  psychiatry.  GAD remains 21.  Will continue current medication regimen as prescribed by psychiatry and recommend she call them first thing in morning to schedule sooner appointment, she is currently scheduled for July 16th.  However, at this time mood not improving and she is reporting increased fatigue, may benefit from changes to regimen but will defer to psychiatry at this time.  Would also benefit from therapy, but refuses at this time.      Obesity    Recommended eating smaller high protein, low fat meals more frequently and exercising 30 mins a day 5 times a week with a goal of 10-15lb weight loss in the next 3 months. Patient voiced their understanding and motivation to adhere to these recommendations.       Major depressive disorder, recurrent episode, moderate (HCC) - Primary    Chronic.  Followed by psychiatry, continue this collaboration and current medication regimen at this time.  She denies SI/HI today.  PHQ9 score increased from 15 to 23 this visit.  She endorses significant fatigue and multiple stressors in life.  Would benefit from therapy, but does not wish to   return.  Recommend she call psychiatry first thing in morning and schedule to follow-up sooner then July 16th due to current mood and increased fatigue.  She verbally agrees to [employer] -- has support system locally and will reach out to them if SI presents or immediately go to ER.  At this time she reports being safe.  Return in 3 months for follow-up.      Generalized body aches    Improving with addition of Gabapentin at last visit, ?element of fibromyalgia involved.  Recent labs unremarkable.  Continue Gabapentin and adjust dose as needed.  Return in 3 months.      Fatigue    Ongoing with recent labs unremarkable, including thyroid and CBC.  Suspect this is related to current depressive state.  PHQ9 score increased from 15 to 23 today.  She denies SI/HI.  Have recommended she call psychiatry in morning and move  follow-up to sooner date due to ongoing fatigue and increase in scores today.  She was able to verbalize this back and plans to call in morning.          Follow up plan: Return in about 3 months (around 04/02/2020) for MOOD.

## 2020-01-01 NOTE — Assessment & Plan Note (Signed)
Ongoing with recent labs unremarkable, including thyroid and CBC.  Suspect this is related to current depressive state.  PHQ9 score increased from 15 to 23 today.  She denies SI/HI.  Have recommended she call psychiatry in morning and move follow-up to sooner date due to ongoing fatigue and increase in scores today.  She was able to verbalize this back and plans to call in morning.

## 2020-01-01 NOTE — Assessment & Plan Note (Signed)
Chronic, followed by psychiatry.  GAD remains 21.  Will continue current medication regimen as prescribed by psychiatry and recommend she call them first thing in morning to schedule sooner appointment, she is currently scheduled for July 16th.  However, at this time mood not improving and she is reporting increased fatigue, may benefit from changes to regimen but will defer to psychiatry at this time.  Would also benefit from therapy, but refuses at this time.

## 2020-01-22 NOTE — Progress Notes (Deleted)
BH MD/PA/NP OP Progress Note  01/22/2020 3:33 PM Tracie James  MRN:  [id number]  Chief Complaint:  HPI:  Worsening in depression, anxiety, fatigue  Visit Diagnosis: No diagnosis found.  Past Psychiatric History: Please see initial evaluation for full details. I have reviewed the history. No updates at this time.     Past Medical History:  Past Medical History:  Diagnosis Date  . Anxiety   . Asthma   . Bipolar 2 disorder (HCC)   . Depression   . GERD (gastroesophageal reflux disease)   . Palpitations   . PVC (premature ventricular contraction)     Past Surgical History:  Procedure Laterality Date  . ABDOMINAL HYSTERECTOMY    . APPENDECTOMY      Family Psychiatric History: Please see initial evaluation for full details. I have reviewed the history. No updates at this time.     Family History:  Family History  Problem Relation Age of Onset  . Depression Mother   . Multiple sclerosis Mother   . Schizophrenia Maternal Grandmother   . Heart disease Maternal Grandmother        aortic valve replacement    Social History:  Social History   Socioeconomic History  . Marital status: Divorced    Spouse name: Not on file  . Number of children: Not on file  . Years of education: Not on file  . Highest education level: Not on file  Occupational History  . Not on file  Tobacco Use  . Smoking status: Former Smoker    Quit date: 03/31/2014    Years since quitting: 5.8  . Smokeless tobacco: Never Used  Vaping Use  . Vaping Use: Never used  Substance and Sexual Activity  . Alcohol use: No  . Drug use: No  . Sexual activity: Yes    Partners: Male    Birth control/protection: Surgical  Other Topics Concern  . Not on file  Social History Narrative  . Not on file   Social Determinants of Health   Financial Resource Strain:   . Difficulty of Paying Living Expenses:   Food Insecurity:   . Worried About Programme researcher, broadcasting/film/video in the Last Year:   . Barista in  the Last Year:   Transportation Needs:   . Freight forwarder (Medical):   Marland Kitchen Lack of Transportation (Non-Medical):   Physical Activity:   . Days of Exercise per Week:   . Minutes of Exercise per Session:   Stress:   . Feeling of Stress :   Social Connections:   . Frequency of Communication with Friends and Family:   . Frequency of Social Gatherings with Friends and Family:   . Attends Religious Services:   . Active Member of Clubs or Organizations:   . Attends Banker Meetings:   Marland Kitchen Marital Status:     Allergies:  Allergies  Allergen Reactions  . Erythromycin Hives  . Tetracyclines & Related Hives    Metabolic Disorder Labs: No results found for: HGBA1C, MPG No results found for: PROLACTIN No results found for: CHOL, TRIG, HDL, CHOLHDL, VLDL, LDLCALC Lab Results  Component Value Date   TSH 2.100 12/07/2019   TSH 2.360 05/03/2016    Therapeutic Level Labs: No results found for: LITHIUM No results found for: VALPROATE No components found for:  CBMZ  Current Medications: Current Outpatient Medications  Medication Sig Dispense Refill  . buPROPion (WELLBUTRIN XL) 150 MG 24 hr tablet Take 300 mg daily and 150  mg daily (total of 450 mg) 90 tablet 0  . buPROPion (WELLBUTRIN XL) 300 MG 24 hr tablet Take 300 mg daily and 150 mg daily (total of 450 mg) 90 tablet 0  . FLUoxetine (PROZAC) 40 MG capsule Take 2 capsules (80 mg total) by mouth daily. 180 capsule 0  . gabapentin (NEURONTIN) 300 MG capsule Take 1 capsule (300 mg total) by mouth at bedtime. 90 capsule 3  . lansoprazole (PREVACID) 15 MG capsule Take 15 mg by mouth daily at 12 noon.    Marland Kitchen LORazepam (ATIVAN) 1 MG tablet Take 1 tablet (1 mg total) by mouth daily as needed for anxiety. 30 tablet 1  . Lurasidone HCl 60 MG TABS Take 1 tablet (60 mg total) by mouth daily with breakfast. 90 tablet 0  . meloxicam (MOBIC) 15 MG tablet Take 1 tablet (15 mg total) by mouth daily. 90 tablet 2  . metoprolol succinate  (TOPROL-XL) 50 MG 24 hr tablet Take with or immediately following a meal. 90 tablet 3  . vitamin B-12 (CYANOCOBALAMIN) 1000 MCG tablet Take 5,000 mcg by mouth daily.     . Vitamin D, Cholecalciferol, 1000 UNITS TABS Take 5,000 Units by mouth daily.      No current facility-administered medications for this visit.     Musculoskeletal: Strength & Muscle Tone: N/A Gait & Station: N/A Patient leans: N/A  Psychiatric Specialty Exam: Review of Systems  There were no vitals taken for this visit.There is no height or weight on file to calculate BMI.  General Appearance: {Appearance:22683}  Eye Contact:  {BHH EYE CONTACT:22684}  Speech:  Clear and Coherent  Volume:  Normal  Mood:  {BHH MOOD:22306}  Affect:  {Affect (PAA):22687}  Thought Process:  Coherent  Orientation:  Full (Time, Place, and Person)  Thought Content: Logical   Suicidal Thoughts:  {ST/HT (PAA):22692}  Homicidal Thoughts:  {ST/HT (PAA):22692}  Memory:  Immediate;   Good  Judgement:  {Judgement (PAA):22694}  Insight:  {Insight (PAA):22695}  Psychomotor Activity:  Normal  Concentration:  Concentration: Good and Attention Span: Good  Recall:  Good  Fund of Knowledge: Good  Language: Good  Akathisia:  No  Handed:  Right  AIMS (if indicated): not done  Assets:  Communication Skills Desire for Improvement  ADL's:  Intact  Cognition: WNL  Sleep:  {BHH GOOD/FAIR/POOR:22877}   Screenings: GAD-7     [facility] Visit from 01/01/2020 in [facility] Office Visit from 06/20/2019 in [facility] Office Visit from 05/09/2018 in [facility]  Total GAD-7 Score 21 21 21     PHQ2-9     Office Visit from 01/01/2020 in [facility] Office Visit from 06/20/2019 in [facility] Office Visit from 05/09/2018 in [facility] Office Visit from 05/03/2016 in [facility] Office Visit from 04/07/2016 in [facility]  PHQ-2 Total Score 6 6 4 6 3   PHQ-9  Total Score 23 15 11 18 13        Assessment and Plan:  Tracie James is a 49 y.o. year old female with a history of depression , who presents for follow up appointment for below.    # MDD, mild, recurrent without psychotic features Although she continues to have depressive symptoms and anxiety in the context of conflict with her ex-husband/custody issues, she has been managing things fairly well.  Other psychosocial stressors includes loss of her mother with MS in April.  We will continue her current medication regimen.  We will continue fluoxetine to  target depression.  We will continue bupropion as adjunctive treatment for depression.  We will continue Latuda as adjunctive treatment for depression.  She is aware of its potential metabolic side effect.  Will continue clonazepam as needed for anxiety.  Discussed risk of dependence and oversedation.   Plan  1. Continue fluoxetine 80 mg daily 2. ContinueBupropion450 mg daily 3. Continue lurasidone 60 mg daily 4 .Continue lorazepam 1 mg as needed for anxiety (shedeclined refill) 5.Next appointment:7/12 at 11 AM for 20 mins, phone  Cymbalta, Zoloft, Prozac, Lithium (sick), Depakote (spending money), Wellbutrin, Latuda, Abilify (did not work), Seroquel (weight gain, somnolence)   The patient demonstrates the following risk factors for suicide: Chronic risk factors for suicide include: psychiatric disorder of depressionand previous suicide attempts of overdosing medication. Acute risk factorsfor suicide include: family or marital conflict. Protective factorsfor this patient include: responsibility to others (children, family), coping skills and hope for the future. Patient is future oriented and agrees to come for next appointment.Considering these factors, the overall suicide risk at this point appears to be low. Patient isappropriate for outpatient followup.  [name], MD 01/22/2020, 3:33 PM

## 2020-01-28 ENCOUNTER — Ambulatory Visit (HOSPITAL_COMMUNITY): Payer: 59 | Admitting: Psychiatry
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
## 2020-02-08 ENCOUNTER — Encounter (HOSPITAL_COMMUNITY): Payer: Self-pay | Admitting: Psychiatry

## 2020-02-08 ENCOUNTER — Other Ambulatory Visit: Payer: Self-pay | Admitting: Nurse Practitioner

## 2020-02-08 ENCOUNTER — Other Ambulatory Visit: Payer: Self-pay

## 2020-02-08 ENCOUNTER — Telehealth (INDEPENDENT_AMBULATORY_CARE_PROVIDER_SITE_OTHER): Payer: 59 | Admitting: Psychiatry

## 2020-02-08 DIAGNOSIS — F419 Anxiety disorder, unspecified: Secondary | ICD-10-CM | POA: Diagnosis not present

## 2020-02-08 DIAGNOSIS — F330 Major depressive disorder, recurrent, mild: Secondary | ICD-10-CM

## 2020-02-08 MED ORDER — BUPROPION HCL ER (XL) 150 MG PO TB24: ORAL_TABLET | ORAL | 0 refills | Status: DC

## 2020-02-08 MED ORDER — FLUOXETINE HCL 40 MG PO CAPS: 80.0000 mg | ORAL_CAPSULE | Freq: Every day | ORAL | 0 refills | Status: DC

## 2020-02-08 MED ORDER — LURASIDONE HCL 60 MG PO TABS: 60.0000 mg | ORAL_TABLET | Freq: Every day | ORAL | 0 refills | Status: DC

## 2020-02-08 MED ORDER — BUPROPION HCL ER (XL) 300 MG PO TB24: ORAL_TABLET | ORAL | 0 refills | Status: DC

## 2020-02-13 ENCOUNTER — Telehealth (HOSPITAL_COMMUNITY): Payer: Self-pay | Admitting: Psychiatry

## 2020-02-13 NOTE — Telephone Encounter (Signed)
Called pt to advised Dr. Vanetta Shawl referred to therapy and to please call to schedule w/ Suzan Garibaldi

## 2020-02-20 ENCOUNTER — Telehealth (HOSPITAL_COMMUNITY): Payer: Self-pay | Admitting: *Deleted

## 2020-02-20 NOTE — Telephone Encounter (Signed)
Spoke with patient and informed her with what provider stated and she verbalized understanding.  °

## 2020-02-20 NOTE — Telephone Encounter (Signed)
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 Please advise the patient to be seen by PCP or neurology for evaluation/treatment.
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
## 2020-04-14 NOTE — Progress Notes (Signed)
Virtual Visit via Telephone Note  I connected with Tracie James on 04/18/20 at 10:20 AM EDT by telephone and verified that I am speaking with the correct person using two identifiers.   I discussed the limitations, risks, security and privacy concerns of performing an evaluation and management service by telephone and the availability of in person appointments. I also discussed with the patient that there may be a patient responsible charge related to this service. The patient expressed understanding and agreed to proceed.     I discussed the assessment and treatment plan with the patient. The patient was provided an opportunity to ask questions and all were answered. The patient agreed with the plan and demonstrated an understanding of the instructions.   The patient was advised to call back or seek an in-person evaluation if the symptoms worsen or if the condition fails to improve as anticipated.  Location: patient- home, provider- home office   I provided 19 minutes of non-face-to-face time during this encounter.   [name], MD    [facility] MD/PA/NP OP Progress Note  04/18/2020 12:25 PM [name]  MRN:  [id number]  Chief Complaint:  Chief Complaint    Depression; Follow-up; Anxiety     HPI:  This is a follow-up appointment for depression.  She states that she is not doing well.  She got a report of parental evaluation.  She states that it tore her apart, stating that she pretty much lost her custody.  She states that her father and her brother were taking side of her ex-husband.  On further elaboration, she states that her brother describes her as fat, lazy, unmotivated, no goals in her life, and she is a habitual liar.  Although her father was not included in the interview, she states that he advise her to let go of her daughter. The court date is pending, and she is unsure if she would be able to meet with her daughter if she were to lose a custody. She is currently  planning to meet her around Thanksgiving, and was unable to see her during summer due to financial strain.  She states that she does not have purpose of life, and reports passive SI.  Although she has guns at home, her boyfriend took it away, and she does not have access to it.  Her husband is very supportive, and is aware of the situation.  She feels comfortable to talk with him for emergent resources if any worsening in SI.  She is willing to see her therapist, and is willing to make medication adjustment if it helps for her.  She states that her boyfriend mother is in her nursing home since she got COVID.  She believes that the mother is enjoying their, being around with people.  She denies insomnia.  She feels depressed.  She denies any issues with concentration when she is at work.  She enjoyed going to watch her niece's soccer game.  She feels anxious and tense at times.   Employment: Designer, industrial/product Support: boyfriend Household: boyfriend Marital status: divorced  Number of children: 2  Visit Diagnosis:    ICD-10-CM   1. MDD (major depressive disorder), recurrent episode, moderate (HCC)  F33.1 buPROPion (WELLBUTRIN XL) 150 MG 24 hr tablet    buPROPion (WELLBUTRIN XL) 300 MG 24 hr tablet    FLUoxetine (PROZAC) 40 MG capsule  2. Anxiety  F41.9 FLUoxetine (PROZAC) 40 MG capsule    LORazepam (ATIVAN) 1 MG tablet    Past Psychiatric  History: Please see initial evaluation for full details. I have reviewed the history. No updates at this time.     Past Medical History:  Past Medical History:  Diagnosis Date  . Anxiety   . Asthma   . Bipolar 2 disorder (HCC)   . Depression   . GERD (gastroesophageal reflux disease)   . Palpitations   . PVC (premature ventricular contraction)     Past Surgical History:  Procedure Laterality Date  . ABDOMINAL HYSTERECTOMY    . APPENDECTOMY      Family Psychiatric History: Please see initial evaluation for full details. I have reviewed the history. No  updates at this time.     Family History:  Family History  Problem Relation Age of Onset  . Depression Mother   . Multiple sclerosis Mother   . Schizophrenia Maternal Grandmother   . Heart disease Maternal Grandmother        aortic valve replacement    Social History:  Social History   Socioeconomic History  . Marital status: Divorced    Spouse name: Not on file  . Number of children: Not on file  . Years of education: Not on file  . Highest education level: Not on file  Occupational History  . Not on file  Tobacco Use  . Smoking status: Former Smoker    Quit date: 03/31/2014    Years since quitting: 6.0  . Smokeless tobacco: Never Used  Vaping Use  . Vaping Use: Never used  Substance and Sexual Activity  . Alcohol use: No  . Drug use: No  . Sexual activity: Yes    Partners: Male    Birth control/protection: Surgical  Other Topics Concern  . Not on file  Social History Narrative  . Not on file   Social Determinants of Health   Financial Resource Strain:   . Difficulty of Paying Living Expenses: Not on file  Food Insecurity:   . Worried About Programme researcher, broadcasting/film/video in the Last Year: Not on file  . Ran Out of Food in the Last Year: Not on file  Transportation Needs:   . Lack of Transportation (Medical): Not on file  . Lack of Transportation (Non-Medical): Not on file  Physical Activity:   . Days of Exercise per Week: Not on file  . Minutes of Exercise per Session: Not on file  Stress:   . Feeling of Stress : Not on file  Social Connections:   . Frequency of Communication with Friends and Family: Not on file  . Frequency of Social Gatherings with Friends and Family: Not on file  . Attends Religious Services: Not on file  . Active Member of Clubs or Organizations: Not on file  . Attends Banker Meetings: Not on file  . Marital Status: Not on file    Allergies:  Allergies  Allergen Reactions  . Erythromycin Hives  . Tetracyclines & Related  Hives    Metabolic Disorder Labs: No results found for: HGBA1C, MPG No results found for: PROLACTIN No results found for: CHOL, TRIG, HDL, CHOLHDL, VLDL, LDLCALC Lab Results  Component Value Date   TSH 2.100 12/07/2019   TSH 2.360 05/03/2016    Therapeutic Level Labs: No results found for: LITHIUM No results found for: VALPROATE No components found for:  CBMZ  Current Medications: Current Outpatient Medications  Medication Sig Dispense Refill  . [START ON 05/08/2020] buPROPion (WELLBUTRIN XL) 150 MG 24 hr tablet Take 300 mg daily and 150 mg daily (total  of 450 mg) 90 tablet 0  . [START ON 05/08/2020] buPROPion (WELLBUTRIN XL) 300 MG 24 hr tablet Take 300 mg daily and 150 mg daily (total of 450 mg) 90 tablet 0  . [START ON 05/08/2020] FLUoxetine (PROZAC) 40 MG capsule Take 2 capsules (80 mg total) by mouth daily. 180 capsule 0  . gabapentin (NEURONTIN) 300 MG capsule Take 1 capsule (300 mg total) by mouth at bedtime. 90 capsule 3  . lansoprazole (PREVACID) 15 MG capsule Take 15 mg by mouth daily at 12 noon.    Marland Kitchen LORazepam (ATIVAN) 1 MG tablet Take 1 tablet (1 mg total) by mouth daily as needed for anxiety. 30 tablet 1  . lurasidone (LATUDA) 80 MG TABS tablet Take 1 tablet (80 mg total) by mouth daily with breakfast. 30 tablet 1  . Lurasidone HCl 60 MG TABS Take 1 tablet (60 mg total) by mouth daily with breakfast. 90 tablet 0  . meloxicam (MOBIC) 15 MG tablet TAKE 1 TABLET BY MOUTH EVERY DAY 90 tablet 2  . metoprolol succinate (TOPROL-XL) 50 MG 24 hr tablet Take with or immediately following a meal. 90 tablet 3  . vitamin B-12 (CYANOCOBALAMIN) 1000 MCG tablet Take 5,000 mcg by mouth daily.     . Vitamin D, Cholecalciferol, 1000 UNITS TABS Take 5,000 Units by mouth daily.      No current facility-administered medications for this visit.     Musculoskeletal: Strength & Muscle Tone: N/A Gait & Station: N/A Patient leans: N/A  Psychiatric Specialty Exam: Review of Systems   Psychiatric/Behavioral: Positive for dysphoric mood, sleep disturbance and suicidal ideas. Negative for agitation, behavioral problems, confusion, decreased concentration, hallucinations and self-injury. The patient is nervous/anxious. The patient is not hyperactive.   All other systems reviewed and are negative.   There were no vitals taken for this visit.There is no height or weight on file to calculate BMI.  General Appearance: NA  Eye Contact:  NA  Speech:  Clear and Coherent  Volume:  Normal  Mood:  Depressed  Affect:  NA  Thought Process:  Coherent  Orientation:  Full (Time, Place, and Person)  Thought Content: Logical   Suicidal Thoughts:  Yes.  without intent/plan  Homicidal Thoughts:  No  Memory:  Immediate;   Good  Judgement:  Good  Insight:  Fair  Psychomotor Activity:  Normal  Concentration:  Concentration: Good and Attention Span: Good  Recall:  Good  Fund of Knowledge: Good  Language: Good  Akathisia:  No  Handed:  Right  AIMS (if indicated): not done  Assets:  Communication Skills Desire for Improvement  ADL's:  Intact  Cognition: WNL  Sleep:  Poor   Screenings: GAD-7     Office Visit from 01/01/2020 in [facility] Office Visit from 06/20/2019 in [facility] Office Visit from 05/09/2018 in [facility]  Total GAD-7 Score 21 21 21     PHQ2-9     Office Visit from 01/01/2020 in [facility] Office Visit from 06/20/2019 in [facility] Office Visit from 05/09/2018 in [facility] Office Visit from 05/03/2016 in [facility] Office Visit from 04/07/2016 in [facility]  PHQ-2 Total Score 6 6 4 6 3   PHQ-9 Total Score 23 15 11 18 13        Assessment and Plan:  Tracie James is a 49 y.o. year old female with a history of depression , who presents for follow up appointment for below.   1. MDD (major  depressive disorder), recurrent episode, moderate (HCC) 2.  Anxiety There has been significant worsening in depressive symptoms in the context of custody issues.  We uptitrate Latuda to optimize treatment for depression.  Discussed potential side effect of EPS and metabolic side effect.  Will continue fluoxetine to target depression and anxiety.  We will continue bupropion as adjunctive treatment for depression.  She has no known history of seizure.  We will continue lorazepam as needed for anxiety.  She is aware of its potential risk of dependence and oversedation.  She will greatly benefit from CBT; will make referral again.   Plan I have reviewed and updated plans as below 1. Continue fluoxetine 80 mg daily 2. ContinueBupropion450 mg daily 3. Increase lurasidone 80 mg daily 4 .Continue lorazepam 1 mg as needed for anxiety  5.Next appointment:10/1 at 10:20 for 20 mins, phone - Referral for therapy  Emergency resources which includes 911, ED, suicide crisis line [phone]) are discussed.   I have reviewed suicide assessment in detail. Updated in the following assessment.   The patient demonstrates the following risk factors for suicide: Chronic risk factors for suicide include: psychiatric disorder of depressionand previous suicide attempts of overdosing medication. Acute risk factorsfor suicide include: family or marital conflict. Protective factorsfor this patient include: responsibility to others (children, family), coping skills and hope for the future. Patient is future oriented and agrees to come for next appointment.Considering these factors, the overall suicide risk at this point appears to be moderate, but not at imminent danger. She is future oriented, and is amenable to treatment. Patient isappropriate for outpatient followup.   [name] Kaleeya Hancock, MD 04/18/2020, 12:25 PM

## 2020-04-18 ENCOUNTER — Telehealth (INDEPENDENT_AMBULATORY_CARE_PROVIDER_SITE_OTHER): Payer: 59 | Admitting: Psychiatry

## 2020-04-18 ENCOUNTER — Telehealth (HOSPITAL_COMMUNITY): Payer: Self-pay | Admitting: Psychiatry

## 2020-04-18 ENCOUNTER — Other Ambulatory Visit: Payer: Self-pay

## 2020-04-18 ENCOUNTER — Encounter (HOSPITAL_COMMUNITY): Payer: Self-pay | Admitting: Psychiatry

## 2020-04-18 DIAGNOSIS — F419 Anxiety disorder, unspecified: Secondary | ICD-10-CM | POA: Diagnosis not present

## 2020-04-18 DIAGNOSIS — F331 Major depressive disorder, recurrent, moderate: Secondary | ICD-10-CM | POA: Diagnosis not present

## 2020-04-18 MED ORDER — BUPROPION HCL ER (XL) 150 MG PO TB24: ORAL_TABLET | ORAL | 0 refills | Status: DC

## 2020-04-18 MED ORDER — BUPROPION HCL ER (XL) 300 MG PO TB24: ORAL_TABLET | ORAL | 0 refills | Status: DC

## 2020-04-18 MED ORDER — FLUOXETINE HCL 40 MG PO CAPS: 80.0000 mg | ORAL_CAPSULE | Freq: Every day | ORAL | 0 refills | Status: DC

## 2020-04-18 MED ORDER — LURASIDONE HCL 80 MG PO TABS: 80.0000 mg | ORAL_TABLET | Freq: Every day | ORAL | 1 refills | Status: DC

## 2020-04-18 MED ORDER — LORAZEPAM 1 MG PO TABS: 1.0000 mg | ORAL_TABLET | Freq: Every day | ORAL | 1 refills | Status: DC | PRN

## 2020-04-18 NOTE — Patient Instructions (Signed)
1. Continue fluoxetine 80 mg daily 2. ContinueBupropion450 mg daily 3. Increase lurasidone 80 mg daily 4 .Continue lorazepam 1 mg as needed for anxiety  5.Next appointment:10/1 at 10:20  CONTACT INFORMATION  What to do if you need to get in touch with someone regarding a psychiatric issue:  1. EMERGENCY: For psychiatric emergencies (if you are suicidal or if there are any other safety issues) call 911 and/or go to your nearest Emergency Room immediately.   2. IF YOU NEED SOMEONE TO TALK TO RIGHT NOW: Given my clinical responsibilities, I may not be able to speak with you over the phone for a prolonged period of time.  A. You may always call The National Suicide Prevention Lifeline at 1-800-273-TALK 901 693 0834).  B. You may walk in to [facility]  Address: [address] [place], [place] [address], Phone: [phone].  Open 24/7, No appointment required. C. Your county of residence will also have local crisis services. For [facility]: [facility] at [phone] (24 Hour Crisis Hotline)

## 2020-04-18 NOTE — Telephone Encounter (Signed)
Called to schedule therapy appt and verify insurance, left vm

## 2020-05-06 ENCOUNTER — Encounter (HOSPITAL_COMMUNITY): Payer: Self-pay | Admitting: *Deleted

## 2020-05-06 ENCOUNTER — Other Ambulatory Visit: Payer: Self-pay

## 2020-05-06 DIAGNOSIS — R6884 Jaw pain: Secondary | ICD-10-CM | POA: Diagnosis present

## 2020-05-06 DIAGNOSIS — Z5321 Procedure and treatment not carried out due to patient leaving prior to being seen by health care provider: Secondary | ICD-10-CM | POA: Insufficient documentation

## 2020-05-06 NOTE — ED Triage Notes (Signed)
Pt with large lump noted to right jaw on Saturday and with severe pain.  Seen dentist on Monday and prescribed Valtrex for "alpheous ulcers" per pt.  Pt not able to eat or drink due to pain. Hurts to swallow per pt.

## 2020-05-07 ENCOUNTER — Emergency Department (HOSPITAL_COMMUNITY)
Admission: EM | Admit: 2020-05-07 | Discharge: 2020-05-07 | Disposition: A | Discharge status: Home / Self Care | Payer: 59 | Attending: Emergency Medicine | Admitting: Emergency Medicine

## 2020-05-19 ENCOUNTER — Ambulatory Visit: Payer: 59 | Admitting: Nurse Practitioner

## 2020-05-19 NOTE — Progress Notes (Signed)
Virtual Visit via Telephone Note  I connected with Tracie James on 05/23/20 at  9:00 AM EDT by telephone and verified that I am speaking with the correct person using two identifiers.  Location: Patient: home Provider: office   I discussed the limitations, risks, security and privacy concerns of performing an evaluation and management service by telephone and the availability of in person appointments. I also discussed with the patient that there may be a patient responsible charge related to this service. The patient expressed understanding and agreed to proceed.     I discussed the assessment and treatment plan with the patient. The patient was provided an opportunity to ask questions and all were answered. The patient agreed with the plan and demonstrated an understanding of the instructions.   The patient was advised to call back or seek an in-person evaluation if the symptoms worsen or if the condition fails to improve as anticipated.  I provided 15 minutes of non-face-to-face time during this encounter.   [name] Azia Toutant, MD    [facility] MD/PA/NP OP Progress Note  05/23/2020 9:33 AM Tracie James  MRN:  [id number]  Chief Complaint:  Chief Complaint    Follow-up; Depression     HPI:  This is a follow-up appointment for depression and anxiety.  She states that she has been doing better, although the situation is the same.  She is still waiting for the court date.  She is unable to hire an attorney.  [place] planning [place] go [place] during [place] the visit her daughter.  She is looking forward to this, a friend that she has not been able to see her since August.  She states that although her daughter states that she is doing okay, she heard from her husband yesterday that her daughter was started on Lexapro.  She states that she feels angry about this.  Her daughter was started on fluoxetine in 2019.  Her husband kept her daughter since Christmas in 2019 after her husband  found this out.  She reports good support from her boyfriend of 10 years.  She denies any issues at work.  She reports improvement in hypersomnia.  She does not take naps anymore.  She has good energy and motivation.  She has started to enjoy cooking.  She has fair concentration.  She feels good about weight loss.  She has good appetite.  She denies SI.  She denies significant anxiety.  She has not taken Ativan for the past month.    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 MDD (major depressive disorder), recurrent episode, mild (HCC)  F33.0   2. Anxiety  F41.9 FLUoxetine (PROZAC) 40 MG capsule  3. MDD (major depressive disorder), recurrent episode, moderate (HCC)  F33.1 buPROPion (WELLBUTRIN XL) 150 MG 24 hr tablet    buPROPion (WELLBUTRIN XL) 300 MG 24 hr tablet    FLUoxetine (PROZAC) 40 MG capsule    Past Psychiatric History: Please see initial evaluation for full details. I have reviewed the history. No updates at this time.     Past Medical History:  Past Medical History:  Diagnosis Date  . Anxiety   . Asthma   . Bipolar 2 disorder (HCC)   . Depression   . GERD (gastroesophageal reflux disease)   . Palpitations   . PVC (premature ventricular contraction)  Past Surgical History:  Procedure Laterality Date  . ABDOMINAL HYSTERECTOMY    . APPENDECTOMY      Family Psychiatric History: Please see initial evaluation for full details. I have reviewed the history. No updates at this time.     Family History:  Family History  Problem Relation Age of Onset  . Depression Mother   . Multiple sclerosis Mother   . Schizophrenia Maternal Grandmother   . Heart disease Maternal Grandmother        aortic valve replacement    Social History:  Social History   Socioeconomic History  . Marital status: Divorced     Spouse name: Not on file  . Number of children: Not on file  . Years of education: Not on file  . Highest education level: Not on file  Occupational History  . Not on file  Tobacco Use  . Smoking status: Former Smoker    Quit date: 03/31/2014    Years since quitting: 6.1  . Smokeless tobacco: Never Used  Vaping Use  . Vaping Use: Never used  Substance and Sexual Activity  . Alcohol use: No  . Drug use: No  . Sexual activity: Yes    Partners: Male    Birth control/protection: Surgical  Other Topics Concern  . Not on file  Social History Narrative  . Not on file   Social Determinants of Health   Financial Resource Strain:   . Difficulty of Paying Living Expenses: Not on file  Food Insecurity:   . Worried About Programme researcher, broadcasting/film/video in the Last Year: Not on file  . Ran Out of Food in the Last Year: Not on file  Transportation Needs:   . Lack of Transportation (Medical): Not on file  . Lack of Transportation (Non-Medical): Not on file  Physical Activity:   . Days of Exercise per Week: Not on file  . Minutes of Exercise per Session: Not on file  Stress:   . Feeling of Stress : Not on file  Social Connections:   . Frequency of Communication with Friends and Family: Not on file  . Frequency of Social Gatherings with Friends and Family: Not on file  . Attends Religious Services: Not on file  . Active Member of Clubs or Organizations: Not on file  . Attends Banker Meetings: Not on file  . Marital Status: Not on file    Allergies:  Allergies  Allergen Reactions  . Erythromycin Hives  . Tetracyclines & Related Hives    Metabolic Disorder Labs: No results found for: HGBA1C, MPG No results found for: PROLACTIN No results found for: CHOL, TRIG, HDL, CHOLHDL, VLDL, LDLCALC Lab Results  Component Value Date   TSH 2.100 12/07/2019   TSH 2.360 05/03/2016    Therapeutic Level Labs: No results found for: LITHIUM No results found for: VALPROATE No components  found for:  CBMZ  Current Medications: Current Outpatient Medications  Medication Sig Dispense Refill  . buPROPion (WELLBUTRIN XL) 150 MG 24 hr tablet Take 300 mg daily and 150 mg daily (total of 450 mg) 30 tablet 2  . buPROPion (WELLBUTRIN XL) 300 MG 24 hr tablet Take 300 mg daily and 150 mg daily (total of 450 mg) 30 tablet 2  . FLUoxetine (PROZAC) 40 MG capsule Take 2 capsules (80 mg total) by mouth daily. 60 capsule 2  . gabapentin (NEURONTIN) 300 MG capsule Take 1 capsule (300 mg total) by mouth at bedtime. 90 capsule 3  . lansoprazole (PREVACID) 15  MG capsule Take 15 mg by mouth daily at 12 noon.    Marland Kitchen LORazepam (ATIVAN) 1 MG tablet Take 1 tablet (1 mg total) by mouth daily as needed for anxiety. 30 tablet 1  . lurasidone (LATUDA) 80 MG TABS tablet Take 1 tablet (80 mg total) by mouth daily with breakfast. 30 tablet 2  . meloxicam (MOBIC) 15 MG tablet TAKE 1 TABLET BY MOUTH EVERY DAY 90 tablet 2  . metoprolol succinate (TOPROL-XL) 50 MG 24 hr tablet Take with or immediately following a meal. 90 tablet 3  . vitamin B-12 (CYANOCOBALAMIN) 1000 MCG tablet Take 5,000 mcg by mouth daily.     . Vitamin D, Cholecalciferol, 1000 UNITS TABS Take 5,000 Units by mouth daily.      No current facility-administered medications for this visit.     Musculoskeletal: Strength & Muscle Tone: N/A Gait & Station: N/A Patient leans: N/A  Psychiatric Specialty Exam: Review of Systems  Psychiatric/Behavioral: Positive for dysphoric mood. Negative for agitation, behavioral problems, confusion, decreased concentration, hallucinations, self-injury, sleep disturbance and suicidal ideas. The patient is not nervous/anxious and is not hyperactive.   All other systems reviewed and are negative.   There were no vitals taken for this visit.There is no height or weight on file to calculate BMI.  General Appearance: NA  Eye Contact:  NA  Speech:  Clear and Coherent  Volume:  Normal  Mood:  better  Affect:  NA   Thought Process:  Coherent  Orientation:  Full (Time, Place, and Person)  Thought Content: Logical   Suicidal Thoughts:  No  Homicidal Thoughts:  No  Memory:  Immediate;   Good  Judgement:  Good  Insight:  Good  Psychomotor Activity:  Normal  Concentration:  Concentration: Good and Attention Span: Good  Recall:  Good  Fund of Knowledge: Good  Language: Good  Akathisia:  No  Handed:  Right  AIMS (if indicated): not done  Assets:  Communication Skills Desire for Improvement  ADL's:  Intact  Cognition: WNL  Sleep:  Fair   Screenings: GAD-7     Office Visit from 01/01/2020 in [facility] Office Visit from 06/20/2019 in [facility] Office Visit from 05/09/2018 in [facility]  Total GAD-7 Score 21 21 21     PHQ2-9     Office Visit from 01/01/2020 in [facility] Office Visit from 06/20/2019 in [facility] Office Visit from 05/09/2018 in [facility] Office Visit from 05/03/2016 in [facility] Office Visit from 04/07/2016 in [facility]  PHQ-2 Total Score 6 6 4 6 3   PHQ-9 Total Score 23 15 11 18 13        Assessment and Plan:  Joylyn Duggin is a 49 y.o. year old female with a history of depression, who presents for follow up appointment for below.   1. MDD (major depressive disorder), recurrent episode, mild (HCC) 2. Anxiety There has been significant improvement in her mood symptoms since up titration of Latuda.  Psychosocial stressors includes custody issues.  Will continue current medication regimen.  Will continue fluoxetine to target depression and anxiety.  We will continue bupropion adjunctive treatment for depression.  She has no known history of seizure.  We will continue Latuda as adjunctive treatment for depression.  Discussed potential metabolic side effect and EPS.  Will continue lorazepam as needed for anxiety.  Discussed risk of dependence and oversedation.     Plan I have reviewed and updated plans as below 1.  Continue fluoxetine 80 mg daily 2. ContinueBupropion450 mg daily 3. Continue lurasidone 80 mg daily 4 .Continue lorazepam 1 mg as needed for anxiety - one refill left 5.Next appointment:1/28 at 9 AM for20 mins, phone - Referral for therapy   The patient demonstrates the following risk factors for suicide: Chronic risk factors for suicide include: psychiatric disorder of depressionand previous suicide attempts of overdosing medication. Acute risk factorsfor suicide include: family or marital conflict. Protective factorsfor this patient include: responsibility to others (children, family), coping skills and hope for the future. Patient is future oriented and agrees to come for next appointment.Considering these factors, the overall suicide risk at this point appears to be moderate, but not at imminent danger. She is future oriented, and is amenable to treatment. Patient isappropriate for outpatient followup.         [name], MD 05/23/2020, 9:33 AM

## 2020-05-23 ENCOUNTER — Telehealth (INDEPENDENT_AMBULATORY_CARE_PROVIDER_SITE_OTHER): Payer: 59 | Admitting: Psychiatry

## 2020-05-23 ENCOUNTER — Encounter: Payer: Self-pay | Admitting: Psychiatry

## 2020-05-23 ENCOUNTER — Telehealth (HOSPITAL_COMMUNITY): Payer: 59 | Admitting: Psychiatry

## 2020-05-23 ENCOUNTER — Other Ambulatory Visit: Payer: Self-pay

## 2020-05-23 DIAGNOSIS — F331 Major depressive disorder, recurrent, moderate: Secondary | ICD-10-CM

## 2020-05-23 DIAGNOSIS — F419 Anxiety disorder, unspecified: Secondary | ICD-10-CM | POA: Diagnosis not present

## 2020-05-23 DIAGNOSIS — F330 Major depressive disorder, recurrent, mild: Secondary | ICD-10-CM | POA: Diagnosis not present

## 2020-05-23 MED ORDER — FLUOXETINE HCL 40 MG PO CAPS: 80.0000 mg | ORAL_CAPSULE | Freq: Every day | ORAL | 2 refills | Status: DC

## 2020-05-23 MED ORDER — BUPROPION HCL ER (XL) 300 MG PO TB24: ORAL_TABLET | ORAL | 2 refills | Status: DC

## 2020-05-23 MED ORDER — BUPROPION HCL ER (XL) 150 MG PO TB24: ORAL_TABLET | ORAL | 2 refills | Status: DC

## 2020-05-23 MED ORDER — LURASIDONE HCL 80 MG PO TABS
80.0000 mg | ORAL_TABLET | Freq: Every day | ORAL | 2 refills | Status: DC
Start: 2020-05-23 — End: 2020-08-15

## 2020-05-29 ENCOUNTER — Other Ambulatory Visit: Payer: Self-pay | Admitting: Nurse Practitioner

## 2020-05-29 NOTE — Telephone Encounter (Signed)
Requested medication (s) are due for refill today: yes  Requested medication (s) are on the active medication list: yes  Last refill:  12/07/19 #90 3 refills  Future visit scheduled: no  Notes to clinic:  please clarify how often patient is to take medication. Patient reports she should take 3 times a day. Order is to take 1 tab at bedtime .     Requested Prescriptions  Pending Prescriptions Disp Refills   gabapentin (NEURONTIN) 300 MG capsule 90 capsule 3    Sig: Take 1 capsule (300 mg total) by mouth at bedtime.      Neurology: Anticonvulsants - gabapentin Passed - 05/29/2020  5:10 PM      Passed - Valid encounter within last 12 months    Recent Outpatient Visits           4 months ago Major depressive disorder, recurrent episode, moderate (HCC)   [facility] [place], [place] T, NP   5 months ago Generalized body aches   [facility] [place], [place] T, NP   11 months ago Major depressive disorder, recurrent episode, moderate (HCC)   [facility] [place], [place] T, NP   2 years ago Chronic pain syndrome   [facility] [place], Corrie Dandy T, NP   2 years ago Dermatitis   [facility] Roosvelt Maser [place], [place]               Refused Prescriptions Disp Refills   gabapentin (NEURONTIN) 300 MG capsule [Pharmacy Med Name: GABAPENTIN 300 MG CAPSULE] 90 capsule 3    Sig: TAKE 1 CAPSULE BY MOUTH THREE TIMES A DAY      Neurology: Anticonvulsants - gabapentin Passed - 05/29/2020  5:10 PM      Passed - Valid encounter within last 12 months    Recent Outpatient Visits           4 months ago Major depressive disorder, recurrent episode, moderate (HCC)   [facility] [place], Jolene T, NP   5 months ago Generalized body aches   [facility] [place], [place] T, NP   11 months ago Major depressive disorder, recurrent episode, moderate (HCC)   [facility] [place], [place] T, NP   2 years  ago Chronic pain syndrome   [facility] [place], Corrie Dandy T, NP   2 years ago Dermatitis   [facility] Roosvelt Maser [place], [place]

## 2020-05-29 NOTE — Telephone Encounter (Signed)
Pt called in to follow up on refill request for medication gabapentin (NEURONTIN) 300 MG capsule. Pt says that she was told by her pharmacy that Rx was denied. Pt says that she was prescribed to take medication 3 times daily.   Pt would like further assistance.

## 2020-05-29 NOTE — Addendum Note (Signed)
Addended by: Cleotilde Neer on: 05/29/2020 05:10 PM   Modules accepted: Orders

## 2020-05-30 ENCOUNTER — Other Ambulatory Visit: Payer: Self-pay

## 2020-05-30 ENCOUNTER — Ambulatory Visit (INDEPENDENT_AMBULATORY_CARE_PROVIDER_SITE_OTHER): Payer: 59 | Admitting: Clinical

## 2020-05-30 DIAGNOSIS — F419 Anxiety disorder, unspecified: Secondary | ICD-10-CM

## 2020-05-30 DIAGNOSIS — F331 Major depressive disorder, recurrent, moderate: Secondary | ICD-10-CM | POA: Diagnosis not present

## 2020-05-30 MED ORDER — GABAPENTIN 300 MG PO CAPS
300.0000 mg | ORAL_CAPSULE | Freq: Three times a day (TID) | ORAL | 4 refills | Status: DC
Start: 2020-05-30 — End: 2021-06-05

## 2020-05-30 NOTE — Progress Notes (Signed)
Virtual Visit via Video Note  I connected with Tracie James on 05/30/20 at 10:00 AM EST by a video enabled telemedicine application and verified that I am speaking with the correct person using two identifiers.  Location: Patient: Home Provider: Office   I discussed the limitations of evaluation and management by telemedicine and the availability of in person appointments. The patient expressed understanding and agreed to proceed.      Comprehensive Clinical Assessment (CCA) Note  05/30/2020 Tracie James [id number]  Chief Complaint: Depression / Anxiety Visit Diagnosis: Depression / Anxiety   CCA Screening, Triage and Referral (STR)  Patient Reported Information How did you hear about [place]? No data recorded Referral name: No data recorded Referral phone number: No data recorded  Whom do you see for routine medical problems? No data recorded Practice/Facility Name: No data recorded Practice/Facility Phone Number: No data recorded Name of Contact: No data recorded Contact Number: No data recorded Contact Fax Number: No data recorded Prescriber Name: No data recorded Prescriber Address (if known): No data recorded  What Is the Reason for Your Visit/Call Today? No data recorded How Long Has This Been Causing You Problems? No data recorded What Do You Feel Would Help You the Most Today? No data recorded  Have You Recently Been in Any Inpatient Treatment (Hospital/Detox/Crisis Center/28-Day Program)? No data recorded Name/Location of Program/Hospital:No data recorded How Long Were You There? No data recorded When Were You Discharged? No data recorded  Have You Ever Received Services From [facility] Before? No data recorded Who Do You See at [facility]? No data recorded  Have You Recently Had Any Thoughts About Hurting Yourself? No data recorded Are You Planning to Commit Suicide/Harm Yourself At This time? No data recorded  Have you Recently Had Thoughts About  Hurting Someone Tracie James? No data recorded Explanation: No data recorded  Have You Used Any Alcohol or Drugs in the Past 24 Hours? No data recorded How Long Ago Did You Use Drugs or Alcohol? No data recorded What Did You Use and How Much? No data recorded  Do You Currently Have a Therapist/Psychiatrist? No data recorded Name of Therapist/Psychiatrist: No data recorded  Have You Been Recently Discharged From Any Office Practice or Programs? No data recorded Explanation of Discharge From Practice/Program: No data recorded    CCA Screening Triage Referral Assessment Type of Contact: No data recorded Is this Initial or Reassessment? No data recorded Date Telepsych consult ordered in [facility]:  No data recorded Time Telepsych consult ordered in [facility]:  No data recorded  Patient Reported Information Reviewed? No data recorded Patient Left Without Being Seen? No data recorded Reason for Not Completing Assessment: No data recorded  Collateral Involvement: No data recorded  Does Patient Have a Court Appointed Legal Guardian? No data recorded Name and Contact of Legal Guardian: No data recorded If Minor and Not Living with Parent(s), Who has Custody? No data recorded Is CPS involved or ever been involved? No data recorded Is APS involved or ever been involved? No data recorded  Patient Determined To Be At Risk for Harm To Self or Others Based on Review of Patient Reported Information or Presenting Complaint? No data recorded Method: No data recorded Availability of Means: No data recorded Intent: No data recorded Notification Required: No data recorded Additional Information for Danger to Others Potential: No data recorded Additional Comments for Danger to Others Potential: No data recorded Are There Guns or Other Weapons in Your Home? No data recorded Types of Guns/Weapons:  No data recorded Are These Weapons Safely Secured?                            No data recorded Who Could Verify You Are  Able To Have These Secured: No data recorded Do You Have any Outstanding Charges, Pending Court Dates, Parole/Probation? No data recorded Contacted To Inform of Risk of Harm To Self or Others: No data recorded  Location of Assessment: No data recorded  Does Patient Present under Involuntary Commitment? No data recorded IVC Papers Initial File Date: No data recorded  [place] data recorded  Patient Currently Receiving the Following Services: No data recorded  Determination of Need: No data recorded  Options For Referral: No data recorded    CCA Biopsychosocial Intake/Chief Complaint:  Depression and anxiety (Patient is a 49 year old Caucasian female that presents oriented x5 (person, place, situation, time and object), alert, overweight, average height, and cooperative)  Current Symptoms/Problems: Mood: irritability, episodes of tearfulness, Anxiety: gets quiet, feels likes she is going to explode    Patient Reported Schizophrenia/Schizoaffective Diagnosis in Past: No   Strengths: Not able to identify  Preferences: Prefer not to go to work, prefers to stay at home, doesn't prefer to go out, doesn't prefer talking   Abilities: Designer, industrial/productLab tech, computers, cooking   Type of Services Patient Feels are Needed: Individual Therapy  and Medication Management (Dr. Vanetta ShawlHisada)   Initial Clinical Notes/Concerns: Symptoms started around her 20's after her mother moved, symptoms occur daily, symptoms are moderate    Mental Health Symptoms Depression:  Irritability;Tearfulness;Change in energy/activity;Difficulty Concentrating;Fatigue;Hopelessness   Duration of Depressive symptoms: No data recorded  Mania:  N/A   Anxiety:   Tension;Irritability;Worrying;Difficulty concentrating;Fatigue;Restlessness   Psychosis:  None   Duration of Psychotic symptoms: No data recorded  Trauma:  N/A   Obsessions:  N/A   Compulsions:  N/A   Inattention:  N/A   Hyperactivity/Impulsivity:   N/A   Oppositional/Defiant Behaviors:  N/A   Emotional Irregularity:  N/A   Other Mood/Personality Symptoms:  None     Mental Status Exam Appearance and self-care  Stature:  Average   Weight:  Overweight   Clothing:  Casual   Grooming:  Normal   Cosmetic use:  Age appropriate   Posture/gait:  Normal   Motor activity:  Not Remarkable   Sensorium  Attention:  Normal   Concentration:  Normal   Orientation:  X5   Recall/memory:  Normal   Affect and Mood  Affect:  Appropriate   Mood:  Depressed   Relating  Eye contact:  Normal   Facial expression:  Responsive   Attitude toward examiner:  Cooperative   Thought and Language  Speech flow: Normal   Thought content:  Appropriate to Mood and Circumstances   Preoccupation:  None (None)   Hallucinations:  None (None)   Organization:  Logical  Company secretaryxecutive Functions  Fund of Knowledge:  Average   Intelligence:  Average   Abstraction:  Normal   Judgement:  Normal   Reality Testing:  Adequate   Insight:  Good   Decision Making:  Normal   Social Functioning  Social Maturity:  Isolates   Social Judgement:  Normal   Stress  Stressors:  Work;Transitions;Family conflict;Grief/losses;Financial (The patients Mother passed away around a year and a half ago, conflict with ex-husband)   Coping Ability:  Normal   Skill Deficits:  Activities of daily living   Supports:  Friends/Service  system     Religion: Religion/Spirituality Are You A Religious Person?: No How Might This Affect Treatment?: No impact  Leisure/Recreation: Leisure / Recreation Do You Have Hobbies?: Yes Leisure and Hobbies: scrapbooking, and making cards  Exercise/Diet: Exercise/Diet Do You Exercise?: No Have You Gained or Lost A Significant Amount of Weight in the Past Six Months?: No Do You Follow a Special Diet?: No Do You Have Any Trouble Sleeping?: No   CCA Employment/Education Employment/Work Situation: Employment /  Work Situation Employment situation: Employed Where is patient currently employed?: [employer] long has patient been employed?: 78yrs 26months Patient's job has been impacted by current illness: No What is the longest time patient has a held a job?: 10 years Where was the patient employed at that time?: [employer]  Has patient ever been in the [employer]?: No  Education: Education Is Patient Currently Attending School?: No Last Grade Completed: 12 Name of High School: [employer]  Did Garment/textile technologist From [employer]?: Yes Did Theme park manager?: Yes What Type of College Degree Do you Have?: The patient notes she went to [employer] in Medical Technology Did You Attend Graduate School?: No What Was Your Major?: Medical Did You Have Any Special Interests In School?: Science  Did You Have An Individualized Education Program (IIEP): No Did You Have Any Difficulty At School?: Yes Were Any Medications Ever Prescribed For These Difficulties?: No Patient's Education Has Been Impacted by Current Illness: No   CCA Family/Childhood History Family and Relationship History: Family history Marital status: Divorced Divorced, when?: 2011 What types of issues is patient dealing with in the relationship?: The patient notes ongoing difficulty with her ex-husband Additional relationship information: No Additional Are you sexually active?: Yes What is your sexual orientation?: Heterosexual  Has your sexual activity been affected by drugs, alcohol, medication, or emotional stress?: None Does patient have children?: Yes How many children?: 2 How is patient's relationship with their children?: The patient notes she has 2 girls. The patient notes, " Pretty good relationship currently with the girls".  Childhood History:  Childhood History By whom was/is the patient raised?: Mother Additional childhood history information: Spent summers with  father Description of patient's relationship with caregiver when they were a child: Mother: typical relationship with mother,   Father:  Good relationship Patient's description of current relationship with people who raised him/her: Mother is Deceased , Father is living, Step Mother is living.  The patient notes, " My relationship with my Father and Step Mother is not very well at the moment". How were you disciplined when you got in trouble as a child/adolescent?: Spanked  Does patient have siblings?: Yes Number of Siblings: 1 Description of patient's current relationship with siblings: The patient notes she has a younger brother. The patient notes, " We are not speaking". Did patient suffer any verbal/emotional/physical/sexual abuse as a child?: No Did patient suffer from severe childhood neglect?: No Has patient ever been sexually abused/assaulted/raped as an adolescent or adult?: No Was the patient ever a victim of a crime or a disaster?: No Witnessed domestic violence?: No Has patient been affected by domestic violence as an adult?: No  Child/Adolescent Assessment:     CCA Substance Use Alcohol/Drug Use: Alcohol / Drug Use Pain Medications: See MAR Prescriptions: See MAR Over the Counter: Vitamin D, B-12 complex History of alcohol / drug use?: No history of alcohol / drug abuse Longest period of sobriety (when/how long): NA  ASAM's:  Six Dimensions of Multidimensional Assessment  Dimension 1:  Acute Intoxication and/or Withdrawal Potential:      Dimension 2:  Biomedical Conditions and Complications:      Dimension 3:  Emotional, Behavioral, or Cognitive Conditions and Complications:     Dimension 4:  Readiness to Change:     Dimension 5:  Relapse, Continued use, or Continued Problem Potential:     Dimension 6:  Recovery/Living Environment:     ASAM Severity Score:    ASAM Recommended Level of Treatment:     Substance use Disorder (SUD)     Recommendations for Services/Supports/Treatments: Recommendations for Services/Supports/Treatments Recommendations For Services/Supports/Treatments: Individual Therapy, Medication Management  DSM5 Diagnoses: Patient Active Problem List   Diagnosis Date Noted  . Generalized body aches 12/07/2019  . Fatigue 12/07/2019  . Chronic pain syndrome 05/09/2018  . Major depressive disorder, recurrent episode, moderate (HCC) 05/28/2016  . Tendonitis of left rotator cuff 01/21/2016  . Obesity 05/27/2015  . Anxiety 05/26/2015  . Palpitations 05/26/2015  . Insomnia 05/26/2015  . Vitamin D deficiency 05/26/2015    Patient Centered Plan: Patient is on the following Treatment Plan(s):  Depression / Anxiety  Referrals to Alternative Service(s): Referred to Alternative Service(s):   Place:   Date:   Time:    Referred to Alternative Service(s):   Place:   Date:   Time:    Referred to Alternative Service(s):   Place:   Date:   Time:    Referred to Alternative Service(s):   Place:   Date:   Time:     I discussed the assessment and treatment plan with the patient. The patient was provided an opportunity to ask questions and all were answered. The patient agreed with the plan and demonstrated an understanding of the instructions.   The patient was advised to call back or seek an in-person evaluation if the symptoms worsen or if the condition fails to improve as anticipated.  I provided 60 minutes of non-face-to-face time during this encounter.  Winfred Burn, LCSW  05/30/2020

## 2020-05-30 NOTE — Telephone Encounter (Signed)
Routing to provider to advise. See message below regarding medication.

## 2020-06-27 ENCOUNTER — Other Ambulatory Visit: Payer: Self-pay | Admitting: Nurse Practitioner

## 2020-06-27 MED ORDER — VALACYCLOVIR HCL 1 G PO TABS: 1000.0000 mg | ORAL_TABLET | Freq: Two times a day (BID) | ORAL | 4 refills | Status: DC

## 2020-06-27 MED ORDER — MAGIC MOUTHWASH W/LIDOCAINE: 5.0000 mL | Freq: Three times a day (TID) | ORAL | 1 refills | Status: DC | PRN

## 2020-07-21 ENCOUNTER — Ambulatory Visit (INDEPENDENT_AMBULATORY_CARE_PROVIDER_SITE_OTHER): Payer: 59 | Admitting: Nurse Practitioner

## 2020-07-21 ENCOUNTER — Other Ambulatory Visit: Payer: Self-pay

## 2020-07-21 ENCOUNTER — Encounter: Payer: Self-pay | Admitting: Nurse Practitioner

## 2020-07-21 VITALS — BP 122/72 | HR 84 | Temp 97.9°F | Ht 63.58 in | Wt 223.8 lb

## 2020-07-21 DIAGNOSIS — K1379 Other lesions of oral mucosa: Secondary | ICD-10-CM | POA: Diagnosis not present

## 2020-07-21 DIAGNOSIS — M25551 Pain in right hip: Secondary | ICD-10-CM

## 2020-07-21 DIAGNOSIS — Z1231 Encounter for screening mammogram for malignant neoplasm of breast: Secondary | ICD-10-CM | POA: Diagnosis not present

## 2020-07-21 MED ORDER — PREDNISONE 10 MG PO TABS: ORAL_TABLET | ORAL | 0 refills | Status: DC

## 2020-07-21 MED ORDER — CYCLOBENZAPRINE HCL 10 MG PO TABS: 10.0000 mg | ORAL_TABLET | Freq: Three times a day (TID) | ORAL | 0 refills | Status: DC | PRN

## 2020-07-21 NOTE — Assessment & Plan Note (Signed)
Acute, ongoing for several weeks.  Suspect related to piriformis based on exam.  At this time will trial Prednisone taper and Flexeril as needed at night.  Recommend she continue TENS unit at home + recommend massage with lacrosse ball and gentles stretching at home.  Tylenol and Meloxicam as needed only.  Recommend OTC Icy/Hot gel + ensure to alternate ice and heat at home as needed.  When driving long distance ensure to get out and stretch frequently.  Return to office for ongoing or worsening, if present will obtain imaging and possible PT.

## 2020-07-21 NOTE — Patient Instructions (Addendum)
[facility] at [facility]  Address: [address] [place], [place], [place] [address]  Phone: [phone]    Cold Sore  A cold sore, also called a fever blister, is a small, fluid-filled sore that forms inside of the mouth or on the lips, gums, nose, chin, or cheeks. Cold sores can spread to other parts of the body, such as the eyes or fingers. Cold sores can spread from person to person (are contagious) until the sores crust over completely. Most cold sores go away within 2 weeks. What are the causes? Cold sores are caused by a virus (herpes simplex virus type 1, HSV-1). The virus can spread from person to person through close contact, such as through:  Kissing.  Touching the affected area.  Sharing personal items such as lip balm, razors, a drinking glass, or eating utensils. What increases the risk? You are more likely to develop this condition if you:  Are tired, stressed, or sick.  Are having your period (menstruating).  Are pregnant.  Take certain medicines.  Are out in cold weather or get too much sun. What are the signs or symptoms? Symptoms of a cold sore outbreak go through different stages. These are the stages of a cold sore:  Tingling, itching, or burning is felt 1-2 days before the outbreak.  Fluid-filled blisters appear on the lips, inside the mouth, on the nose, or on the cheeks.  The blisters start to ooze clear fluid.  The blisters dry up, and a yellow crust appears in their place.  The crust falls off. In some cases, other symptoms can develop during a cold sore outbreak. These can include:  Fever.  Sore throat.  Headache.  Muscle aches.  Swollen neck glands. How is this treated? There is no cure for cold sores or the virus that causes them. There is also no vaccine to prevent the virus. Most cold sores go away on their own without treatment within 2 weeks. Your doctor may prescribe medicines to:  Help with pain.  Keep  the virus from growing.  Help you heal faster. Medicines may be in the form of creams, gels, pills, or a shot. Follow these instructions at home: Medicines  Take or apply over-the-counter and prescription medicines only as told by your doctor.  Use a cotton-tip swab to apply creams or gels to your sores.  Ask your doctor if you can take lysine supplements. These may help with healing. Sore care   Do not touch the sores or pick the scabs.  Wash your hands often. Do not touch your eyes without washing your hands first.  Keep the sores clean and dry.  If told, put ice on the sores: ? Put ice in a plastic bag. ? Place a towel between your skin and the bag. ? Leave the ice on for 20 minutes, 2-3 times a day. Eating and drinking  Eat a soft, bland diet. Avoid eating hot, cold, or salty foods. These can hurt your mouth.  Use a straw if it hurts to drink out of a glass.  Eat foods that have a lot of lysine in them. These include meat, fish, and dairy products.  Avoid sugary foods, chocolates, nuts, and grains. These foods have a high amount of a substance (arginine) that can cause the virus to grow. Lifestyle  Do not kiss, have oral sex, or share personal items until your sores heal.  Stress, poor sleep, and being out in the sun can trigger outbreaks. Make  sure you: ? Do activities that help you relax, such as deep breathing exercises or meditation. ? Get enough sleep. ? Apply sunscreen on your lips before you go out in the sun. Contact a doctor if:  You have symptoms for more than 2 weeks.  You have pus coming from the sores.  You have redness that is spreading.  You have pain or irritation in your eye.  You get sores on your genitals.  Your sores do not heal within 2 weeks.  You get cold sores often. Get help right away if:  You have a fever and your symptoms suddenly get worse.  You have a headache and confusion.  You have tiredness (fatigue).  You do not  want to eat as much as normal (loss of appetite).  You have a stiff neck or are sensitive to light. Summary  A cold sore is a small, fluid-filled sore that forms inside of the mouth or on the lips, gums, nose, chin, or cheeks.  Cold sores can spread from person to person (are contagious) until the sores crust over completely. Most cold sores go away within 2 weeks.  Wash your hands often. Do not touch your eyes without washing your hands first.  Do not kiss, have oral sex, or share personal items until your sores heal.  Contact a doctor if your sores do not heal within 2 weeks. This information is not intended to replace advice given to you by your health care provider. Make sure you discuss any questions you have with your health care provider. Document Revised: 10/25/2018 Document Reviewed: 12/05/2017 Elsevier Patient Education  2020 [employer].   Piriformis Syndrome  Piriformis syndrome is a condition that can cause pain and numbness in your buttocks and down the back of your leg. Piriformis syndrome happens when the small muscle that connects the base of your spine to your hip (piriformis muscle) presses on the nerve that runs down the back of your leg (sciatic nerve). The piriformis muscle helps your hip rotate and helps to bring your leg back and out. It also helps shift your weight to keep you stable while you are walking. The sciatic nerve runs under or through the piriformis muscle. Damage to the piriformis muscle can cause spasms that put pressure on the nerve below. This causes pain and discomfort while sitting and moving. The pain may feel as if it begins in the buttock and spreads (radiates) down your hip and thigh. What are the causes? This condition is caused by pressure on the sciatic nerve from the piriformis muscle. The piriformis muscle can get irritated with overuse, especially if other hip muscles are weak and the piriformis muscle has to do extra work. Piriformis  syndrome can also occur after an injury, like a fall onto your buttocks. What increases the risk? You are more likely to develop this condition if you:  Are a woman.  Sit for long periods of time.  Are a cyclist.  Have weak buttocks muscles (gluteal muscles). What are the signs or symptoms? Symptoms of this condition include:  Pain, tingling, or numbness that starts in the buttock and runs down the back of your leg (sciatica).  Pain in the groin or thigh area. Your symptoms may get worse:  The longer you sit.  When you walk, run, or climb stairs.  When straining to have a bowel movement. How is this diagnosed? This condition is diagnosed based on your symptoms, medical history, and physical exam.  During the  exam, your health care provider may: ? Move your leg into different positions to check for pain. ? Press on the muscles of your hip and buttock to see if that increases your symptoms.  You may also have tests, including: ? Imaging tests such as X-rays, MRI, or ultrasound. ? Electromyogram (EMG). This test measures electrical signals sent by your nerves into the muscles. ? Nerve conduction study. This test measures how well electrical signals pass through your nerves. How is this treated? This condition may be treated by:  Stopping all activities that cause pain or make your condition worse.  Applying ice or using heat therapy.  Taking medicines to reduce pain and swelling.  Taking a muscle relaxer (muscle relaxant) to stop muscle spasms.  Doing range-of-motion and strengthening exercises (physical therapy) as told by your health care provider.  Massaging the area.  Having acupuncture.  Getting an injection of medicine in the piriformis muscle. Your health care provider will choose the medicine based on your condition. He or she may inject: ? An anti-inflammatory medicine (steroid) to reduce swelling. ? A numbing medicine (local anesthetic) to block the  pain. ? Botulinum toxin. The toxin blocks nerve impulses to specific muscles to reduce muscle tension. In rare cases, you may need surgery to cut the muscle and release pressure on the nerve if other treatments do not work. Follow these instructions at home: Activity  Do not sit for long periods. Get up and walk around every 20 minutes or as often as told by your health care provider. ? When driving long distances, make sure to take frequent stops to get up and stretch.  Use a cushion when you sit on hard surfaces.  Do exercises as told by your health care provider.  Return to your normal activities as told by your health care provider. Ask your health care provider what activities are safe for you. Managing pain, stiffness, and swelling      If directed, apply heat to the affected area as often as told by your health care provider. Use the heat source that your health care provider recommends, such as a moist heat pack or a heating pad. ? Place a towel between your skin and the heat source. ? Leave the heat on for 20-30 minutes. ? Remove the heat if your skin turns bright red. This is especially important if you are unable to feel pain, heat, or cold. You may have a greater risk of getting burned.  If directed, put ice on the injured area. ? Put ice in a plastic bag. ? Place a towel between your skin and the bag. ? Leave the ice on for 20 minutes, 2-3 times a day. General instructions  Take over-the-counter and prescription medicines only as told by your health care provider.  Ask your health care provider if the medicine prescribed to you requires you to avoid driving or using heavy machinery.  You may need to take actions to prevent or treat constipation, such as: ? Drink enough fluid to keep your urine pale yellow. ? Take over-the-counter or prescription medicines. ? Eat foods that are high in fiber, such as beans, whole grains, and fresh fruits and vegetables. ? Limit foods  that are high in fat and processed sugars, such as fried or sweet foods.  Keep all follow-up visits as told by your health care provider. This is important. How is this prevented?  Do not sit for longer than 20 minutes at a time. When you sit, choose  padded surfaces.  Warm up and stretch before being active.  Cool down and stretch after being active.  Give your body time to rest between periods of activity.  Make sure to use equipment that fits you.  Maintain physical fitness, including: ? Strength. ? Flexibility. Contact a health care provider if:  Your pain and stiffness continue or get worse.  Your leg or hip becomes weak.  You have changes in your bowel function or bladder function. Summary  Piriformis syndrome is a condition that can cause pain, tingling, and numbness in your buttocks and down the back of your leg.  You may try applying heat or ice to relieve the pain.  Do not sit for long periods. Get up and walk around every 20 minutes or as often as told by your health care provider. This information is not intended to replace advice given to you by your health care provider. Make sure you discuss any questions you have with your health care provider. Document Revised: 10/26/2018 Document Reviewed: 03/01/2018 Elsevier Patient Education  2020 [employer].

## 2020-07-21 NOTE — Assessment & Plan Note (Signed)
Resolved at this time with oral Valtrex treatment and magic mouthwash, plan to return to office if symptoms return and consider HSV labs at next visit.

## 2020-07-21 NOTE — Progress Notes (Signed)
 BP 122/72   Pulse 84   Temp 97.9 F (36.6 C) (Oral)   Ht 5' 3.58" (1.615 m)   Wt 223 lb 12.8 oz (101.5 kg)   LMP  (LMP Unknown)   SpO2 99%   BMI 38.92 kg/m    Subjective:    Patient ID: Tracie James, female    DOB: 09/06/1970, 49 y.o.   MRN: [id number]  HPI: Tracie James is a 49 y.o. female  Chief Complaint  Patient presents with  . Mouth Lesions    Since Thanksgiving through Christmas, Patient believes that they are resolved at this time  . Hip Pain    Right Hip pain since Thanksgiving when she drove to [place].    ORAL LESIONS: These presented around Thanksgiving and last throughout Christmas, has not had issues like this before.  Treated with Valtrex round x 2 and magic mouthwash, but did not have to use magic mouth wash.  Reports 1/2 way through Valtrex the lesions started to improve. At this time no further sores in mouth or tingling.  Tongue no longer feels swollen.  Reports they were around entire inside of mouth and tongue.  Had not started any new medications or ate any new foods.   HIP PAIN (RIGHT) Has been present since before Thanksgiving, has "just dealt with it".  Has been taking Meloxicam.  Chiropractor adjustment did not benefit.  Started after a long drive to [place], dominant side with driving. Duration: months Involved hip: right  Mechanism of injury: unknown Location: lateral Onset: sudden  Severity: 8-9/10 at night and during day when on feet no pain Quality: aching and throbbing Frequency: intermittent Radiation: yes radiates down right leg above knee Aggravating factors: prolonged sitting   Alleviating factors: Meloxicam and rest  Status: fluctuating Treatments attempted: rest, ibuprofen and chiropractor   Relief with NSAIDs?: mild Weakness with weight bearing: no Weakness with walking: no Paresthesias / decreased sensation: no Swelling: no Redness:no Fevers: no  Relevant past medical, surgical, family and social history reviewed and  updated as indicated. Interim medical history since our last visit reviewed. Allergies and medications reviewed and updated.  Review of Systems  Constitutional: Negative for activity change, appetite change, diaphoresis, fatigue and fever.  Respiratory: Negative for cough, chest tightness and shortness of breath.   Cardiovascular: Negative for chest pain, palpitations and leg swelling.  Gastrointestinal: Negative.   Musculoskeletal: Positive for arthralgias.  Neurological: Negative.   Psychiatric/Behavioral: Negative.     Per HPI unless specifically indicated above     Objective:    BP 122/72   Pulse 84   Temp 97.9 F (36.6 C) (Oral)   Ht 5' 3.58" (1.615 m)   Wt 223 lb 12.8 oz (101.5 kg)   LMP  (LMP Unknown)   SpO2 99%   BMI 38.92 kg/m   Wt Readings from Last 3 Encounters:  07/21/20 223 lb 12.8 oz (101.5 kg)  05/06/20 235 lb (106.6 kg)  01/01/20 236 lb 9.6 oz (107.3 kg)    Physical Exam Vitals and nursing note reviewed.  Constitutional:      General: She is awake. She is not in acute distress.    Appearance: She is well-developed. She is obese. She is not ill-appearing.  HENT:     Head: Normocephalic.     Right Ear: Hearing normal.     Left Ear: Hearing normal.     Nose: Nose normal.     Mouth/Throat:     Lips: Pink.     Mouth:   Mucous membranes are moist.     Pharynx: Oropharynx is clear.  Eyes:     General: Lids are normal.        Right eye: No discharge.        Left eye: No discharge.     Conjunctiva/sclera: Conjunctivae normal.     Pupils: Pupils are equal, round, and reactive to light.  Cardiovascular:     Rate and Rhythm: Normal rate and regular rhythm.     Heart sounds: Normal heart sounds. No murmur heard. No gallop.   Pulmonary:     Effort: Pulmonary effort is normal. No accessory muscle usage or respiratory distress.     Breath sounds: Normal breath sounds.  Abdominal:     General: Bowel sounds are normal.     Palpations: Abdomen is soft.   Musculoskeletal:     Cervical back: Normal range of motion and neck supple.     Lumbar back: Normal. Negative right straight leg raise test and negative left straight leg raise test.     Right hip: Tenderness (to posterior gluteus and lateral aspect) present. No deformity, lacerations, bony tenderness or crepitus. Normal range of motion. Normal strength.     Left hip: Normal.     Right lower leg: No edema.     Left lower leg: No edema.  Skin:    General: Skin is warm and dry.  Neurological:     Mental Status: She is alert and oriented to person, place, and time.  Psychiatric:        Attention and Perception: Attention normal.        Mood and Affect: Mood normal.        Speech: Speech normal.        Behavior: Behavior normal. Behavior is cooperative.        Thought Content: Thought content normal.    Hip Exam: bilateral     Tenderness to palpation: yes, mid gluteus and lateral hip     Greater trochanter: no      Anterior superior iliac spine: no     Anterior hip: no     Iliac crest: no     Iliac tubercle: no     Pubic tubercle: no     SI joint: no      Range of Motion: Full ROM    Flexion: Normal, tenderness with ROM    Extension: Normal    Abduction: Normal tenderness with ROM    Adduction: Normal    Internal rotation: Normal    External rotation: Normal     Muscle Strength:  5/5 bilaterally    Flexion:  Within Normal Limits    Extension:  Within Normal Limits    Abductors:  Within Normal Limits    Adductors:  Within Normal Limits     Special Tests:    Nicoletta Dress test: negative    FABER test:positive   Results for orders placed or performed in visit on 12/07/19  ANA w/Reflex if Positive  Result Value Ref Range   Anti Nuclear Antibody (ANA) Negative Negative  CBC with Differential/Platelet  Result Value Ref Range   WBC 8.3 3.4 - 10.8 x10E3/uL   RBC 4.71 3.77 - 5.28 x10E6/uL   Hemoglobin 13.6 11.1 - 15.9 g/dL   Hematocrit 40.2 34.0 - 46.6 %   MCV 85 79 - 97 fL    MCH 28.9 26.6 - 33.0 pg   MCHC 33.8 31.5 - 35.7 g/dL   RDW 13.6 11.7 - 15.4 %  Platelets 269 150 - 450 x10E3/uL   Neutrophils 65 Not Estab. %   Lymphs 24 Not Estab. %   Monocytes 7 Not Estab. %   Eos 2 Not Estab. %   Basos 1 Not Estab. %   Neutrophils Absolute 5.5 1.4 - 7.0 x10E3/uL   Lymphocytes Absolute 2.0 0.7 - 3.1 x10E3/uL   Monocytes Absolute 0.6 0.1 - 0.9 x10E3/uL   EOS (ABSOLUTE) 0.1 0.0 - 0.4 x10E3/uL   Basophils Absolute 0.1 0.0 - 0.2 x10E3/uL   Immature Granulocytes 1 Not Estab. %   Immature Grans (Abs) 0.1 0.0 - 0.1 x10E3/uL  Comprehensive metabolic panel  Result Value Ref Range   Glucose 71 65 - 99 mg/dL   BUN 16 6 - 24 mg/dL   Creatinine, Ser 1.21 (H) 0.57 - 1.00 mg/dL   GFR calc non Af Amer 52 (L) >59 mL/min/1.73   GFR calc Af Amer 60 >59 mL/min/1.73   BUN/Creatinine Ratio 13 9 - 23   Sodium 136 134 - 144 mmol/L   Potassium 4.5 3.5 - 5.2 mmol/L   Chloride 98 96 - 106 mmol/L   CO2 24 20 - 29 mmol/L   Calcium 9.8 8.7 - 10.2 mg/dL   Total Protein 7.0 6.0 - 8.5 g/dL   Albumin 4.5 3.8 - 4.8 g/dL   Globulin, Total 2.5 1.5 - 4.5 g/dL   Albumin/Globulin Ratio 1.8 1.2 - 2.2   Bilirubin Total 0.5 0.0 - 1.2 mg/dL   Alkaline Phosphatase 92 48 - 121 IU/L   AST 28 0 - 40 IU/L   ALT 30 0 - 32 IU/L  TSH  Result Value Ref Range   TSH 2.100 0.450 - 4.500 uIU/mL  Vitamin B12  Result Value Ref Range   Vitamin B-12 >2000 (H) 232 - 1245 pg/mL  Sed Rate (ESR)  Result Value Ref Range   Sed Rate 12 0 - 40 mm/hr  C-reactive protein  Result Value Ref Range   CRP 3 0 - 10 mg/L      Assessment & Plan:   Problem List Items Addressed This Visit      Other   Mouth sores    Resolved at this time with oral Valtrex treatment and magic mouthwash, plan to return to office if symptoms return and consider HSV labs at next visit.      Acute right hip pain - Primary    Acute, ongoing for several weeks.  Suspect related to piriformis based on exam.  At this time will trial  Prednisone taper and Flexeril as needed at night.  Recommend she continue TENS unit at home + recommend massage with lacrosse ball and gentles stretching at home.  Tylenol and Meloxicam as needed only.  Recommend OTC Icy/Hot gel + ensure to alternate ice and heat at home as needed.  When driving long distance ensure to get out and stretch frequently.  Return to office for ongoing or worsening, if present will obtain imaging and possible PT.       Other Visit Diagnoses    Encounter for screening mammogram for malignant neoplasm of breast       Mammogram ordered   Relevant Orders   MM 3D SCREEN BREAST BILATERAL       Follow up plan: Return in about 6 months (around 01/18/2021) for MOOD . 
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
## 2020-08-15 ENCOUNTER — Telehealth (INDEPENDENT_AMBULATORY_CARE_PROVIDER_SITE_OTHER): Payer: 59 | Admitting: Psychiatry

## 2020-08-15 ENCOUNTER — Encounter: Payer: Self-pay | Admitting: Psychiatry

## 2020-08-15 ENCOUNTER — Other Ambulatory Visit: Payer: Self-pay

## 2020-08-15 ENCOUNTER — Telehealth: Payer: Self-pay

## 2020-08-15 DIAGNOSIS — F419 Anxiety disorder, unspecified: Secondary | ICD-10-CM

## 2020-08-15 DIAGNOSIS — F331 Major depressive disorder, recurrent, moderate: Secondary | ICD-10-CM | POA: Diagnosis not present

## 2020-08-15 DIAGNOSIS — F330 Major depressive disorder, recurrent, mild: Secondary | ICD-10-CM | POA: Diagnosis not present

## 2020-08-15 MED ORDER — FLUOXETINE HCL 40 MG PO CAPS
80.0000 mg | ORAL_CAPSULE | Freq: Every day | ORAL | 0 refills | Status: DC
Start: 2020-08-23 — End: 2020-11-07

## 2020-08-15 MED ORDER — LURASIDONE HCL 80 MG PO TABS: 80.0000 mg | ORAL_TABLET | Freq: Every day | ORAL | 0 refills | Status: DC

## 2020-08-15 MED ORDER — BUPROPION HCL ER (XL) 150 MG PO TB24: ORAL_TABLET | ORAL | 0 refills | Status: DC

## 2020-08-15 MED ORDER — BUPROPION HCL ER (XL) 300 MG PO TB24: ORAL_TABLET | ORAL | 0 refills | Status: DC

## 2020-08-15 NOTE — Telephone Encounter (Signed)
received fax that latuda is not covered by insurance plan.  something else will need to be sent in

## 2020-08-18 NOTE — Telephone Encounter (Signed)
I am not sure what happened as she has been on this medication at least for a few years. Could you advice the patient whether she can use coupon on the website?

## 2020-08-19 ENCOUNTER — Telehealth: Payer: Self-pay

## 2020-08-19 NOTE — Telephone Encounter (Signed)
received fax - pt has a new insurance and it is not coverred.

## 2020-09-01 ENCOUNTER — Telehealth: Payer: Self-pay

## 2020-09-01 NOTE — Telephone Encounter (Signed)
pt called left message that she stated that the insurance she has now will not cover lautuda at all. even with a prior auth. pleaes let her know what she can take to replace

## 2020-09-01 NOTE — Telephone Encounter (Signed)
Called the patient. She is willing to try Geodon in replace of latuda. Discussed below.  - She will get EKG at PCP office, and will fax the result to [place] (last EKG QTc 482 msec in 2013) - Will plan to start Geodon 20 mg twice a day after reviewing EKG. . Discussed potential risk of arrhythmia (QTc prolongation), metabolic side effect, and EPS.

## 2020-09-02 ENCOUNTER — Ambulatory Visit: Payer: 59 | Admitting: Nurse Practitioner

## 2020-09-04 ENCOUNTER — Other Ambulatory Visit: Payer: Self-pay | Admitting: Psychiatry

## 2020-09-04 NOTE — Telephone Encounter (Signed)
Could you contact the pharmacy. There should be a refill left according to the database.

## 2020-09-04 NOTE — Telephone Encounter (Signed)
pt called states she needs a refill on her ativan

## 2020-09-08 ENCOUNTER — Other Ambulatory Visit: Payer: Self-pay

## 2020-09-08 ENCOUNTER — Ambulatory Visit (INDEPENDENT_AMBULATORY_CARE_PROVIDER_SITE_OTHER): Payer: 59 | Admitting: Nurse Practitioner

## 2020-09-08 ENCOUNTER — Encounter: Payer: Self-pay | Admitting: Nurse Practitioner

## 2020-09-08 VITALS — BP 144/82 | HR 91 | Temp 98.3°F | Wt 232.8 lb

## 2020-09-08 DIAGNOSIS — F331 Major depressive disorder, recurrent, moderate: Secondary | ICD-10-CM

## 2020-09-08 DIAGNOSIS — Z5181 Encounter for therapeutic drug level monitoring: Secondary | ICD-10-CM

## 2020-09-08 NOTE — Patient Instructions (Signed)

## 2020-09-08 NOTE — Assessment & Plan Note (Signed)
Chronic.  Followed by psychiatry, continue this collaboration and current medication regimen at this time.  She denies SI/HI today.  She verbally agrees to Engineer, manufacturing systems -- has support system locally and will reach out to them if SI presents or immediately go to ER.  At this time she reports being safe.  Return in 3 months for follow-up.

## 2020-09-08 NOTE — Assessment & Plan Note (Signed)
EKG with NSR in office today, okay to start new medication if needed.

## 2020-09-08 NOTE — Progress Notes (Signed)
BP (!) 144/82   Pulse 91   Temp 98.3 F (36.8 C) (Oral)   Wt 232 lb 12.8 oz (105.6 kg)   LMP  (LMP Unknown)   SpO2 98%   BMI 40.49 kg/m    Subjective:    Patient ID: Tracie James, female    DOB: 1970-09-15, 50 y.o.   MRN: [id number]  HPI: Tracie James is a 50 y.o. female  Chief Complaint  Patient presents with  . Follow-up    Pt states she is here for an EKG for psychiatrist to alter the current dose of her medication.    ANXIETY/STRESS/DEPRESSION Continues on Wellbutrin, Fluoxetine, Lurasidone, and Ativan (which she only uses when needed and reports this is not often). Followed by psychiatry, sees every 3 months and last saw on 08/15/20.  States current regimen works well.  She reports ongoing fatigue, but improvement in overall body aches reported at last visit with Gabapentin.  There is concern for need to change regimen as Latuda not being covered by insurance/not formulary -- needs EKG today due to this.  Lost mother over one year ago -- her mother had depression and took Zoloft for mood.  She also endorses financial stressors with paying legal fees and hours that custody battle has taken -- just had court on Friday.  She denies any suicide plan, but does endorse fleeting thoughts that life may be better if she were not here, however has her child to be here for.  Has a support system nearby if SI presents.  Denies SI/HI today.  Has gone to multiple therapists in past, but feels this did not help, did see therapy last on 05/30/20.  Duration:stable Anxious mood: occasional  Excessive worrying: no Irritability: no  Sweating: no Nausea: no Palpitations:no Hyperventilation: no Panic attacks: no Agoraphobia: no  Obscessions/compulsions: no Depressed mood: occasional Depression screen [facility] 2/9 07/21/2020 01/01/2020 06/20/2019 05/09/2018 05/03/2016  Decreased Interest _0 Down, Depressed, Hopeless 0 _1 PHQ - 2 Score _2 Altered sleeping 0 3 0 1 3   Tired, decreased energy _3 Change in appetite 0 _4 Feeling bad or failure about yourself  0 3 3 0 3  Trouble concentrating 0 2 0 0 0  Moving slowly or fidgety/restless 0 2 0 0 0  Suicidal thoughts 0 1 0 0 0  PHQ-9 Score _5 Difficult doing work/chores - - Not difficult at all Somewhat difficult Somewhat difficult  Some recent data might be hidden  Anhedonia: no Weight changes: no Insomnia: none Hypersomnia: no Fatigue/loss of energy: yes Feelings of worthlessness: yes Feelings of guilt: yes Impaired concentration/indecisiveness: yes Suicidal ideations: no  Crying spells: yes Recent Stressors/Life Changes: yes   Relationship problems: yes   Family stress: no     Financial stress: yes    Job stress: no    Recent death/loss: no GAD 7 : Generalized Anxiety Score 07/21/2020 01/01/2020 06/20/2019 05/09/2018  Nervous, Anxious, on Edge _6 Control/stop worrying _7 Worry too much - different things _8 Trouble relaxing _9 Restless _10 Easily annoyed or irritable _11 Afraid - awful might happen _12 Total GAD 7 Score _13 Anxiety Difficulty - Very difficult Not difficult at  all Very difficult    Relevant past medical, surgical, family and social history reviewed and updated as indicated. Interim medical history since our last visit reviewed. Allergies and medications reviewed and updated.  Review of Systems  Constitutional: Positive for fatigue. Negative for activity change, appetite change, diaphoresis and fever.  Respiratory: Negative for cough, chest tightness and shortness of breath.   Cardiovascular: Negative for chest pain, palpitations and leg swelling.  Gastrointestinal: Negative.   Neurological: Negative.   Psychiatric/Behavioral: Positive for decreased concentration. Negative for self-injury and sleep disturbance. The patient is nervous/anxious.     Per HPI unless specifically indicated above      Objective:    BP (!) 144/82   Pulse 91   Temp 98.3 F (36.8 C) (Oral)   Wt 232 lb 12.8 oz (105.6 kg)   LMP  (LMP Unknown)   SpO2 98%   BMI 40.49 kg/m   Wt Readings from Last 3 Encounters:  09/08/20 232 lb 12.8 oz (105.6 kg)  07/21/20 223 lb 12.8 oz (101.5 kg)  05/06/20 235 lb (106.6 kg)    Physical Exam Vitals and nursing note reviewed.  Constitutional:      General: She is awake. She is not in acute distress.    Appearance: She is well-developed and well-groomed. She is obese. She is not ill-appearing.  HENT:     Head: Normocephalic.     Right Ear: Hearing normal.     Left Ear: Hearing normal.     Nose: Nose normal.  Eyes:     General: Lids are normal.        Right eye: No discharge.        Left eye: No discharge.     Conjunctiva/sclera: Conjunctivae normal.     Pupils: Pupils are equal, round, and reactive to light.  Neck:     Thyroid: No thyromegaly.     Vascular: No carotid bruit.  Cardiovascular:     Rate and Rhythm: Normal rate and regular rhythm.     Heart sounds: Normal heart sounds. No murmur heard. No gallop.   Pulmonary:     Effort: Pulmonary effort is normal. No accessory muscle usage or respiratory distress.     Breath sounds: Normal breath sounds.  Abdominal:     General: Bowel sounds are normal.     Palpations: Abdomen is soft.  Musculoskeletal:     Cervical back: Normal range of motion and neck supple.     Right lower leg: No edema.     Left lower leg: No edema.  Skin:    General: Skin is warm and dry.  Neurological:     Mental Status: She is alert and oriented to person, place, and time.  Psychiatric:        Attention and Perception: Attention normal.        Mood and Affect: Affect is tearful.        Speech: Speech normal.        Behavior: Behavior normal. Behavior is cooperative.        Thought Content: Thought content normal.     Comments: Very tearful today during visit, especially when discussing current stressors.    EKG My review  and personal interpretation at Time: 1434   Indication: medication  Rate: 80  Rhythm: sinus Axis: normal Other: no stemi, no lvh  Results for orders placed or performed in visit on 12/07/19  ANA w/Reflex if Positive  Result Value Ref Range   Anti Nuclear Antibody (ANA) Negative  Negative  CBC with Differential/Platelet  Result Value Ref Range   WBC 8.3 3.4 - 10.8 x10E3/uL   RBC 4.71 3.77 - 5.28 x10E6/uL   Hemoglobin 13.6 11.1 - 15.9 g/dL   Hematocrit 40.2 34.0 - 46.6 %   MCV 85 79 - 97 fL   MCH 28.9 26.6 - 33.0 pg   MCHC 33.8 31.5 - 35.7 g/dL   RDW 13.6 11.7 - 15.4 %   Platelets 269 150 - 450 x10E3/uL   Neutrophils 65 Not Estab. %   Lymphs 24 Not Estab. %   Monocytes 7 Not Estab. %   Eos 2 Not Estab. %   Basos 1 Not Estab. %   Neutrophils Absolute 5.5 1.4 - 7.0 x10E3/uL   Lymphocytes Absolute 2.0 0.7 - 3.1 x10E3/uL   Monocytes Absolute 0.6 0.1 - 0.9 x10E3/uL   EOS (ABSOLUTE) 0.1 0.0 - 0.4 x10E3/uL   Basophils Absolute 0.1 0.0 - 0.2 x10E3/uL   Immature Granulocytes 1 Not Estab. %   Immature Grans (Abs) 0.1 0.0 - 0.1 x10E3/uL  Comprehensive metabolic panel  Result Value Ref Range   Glucose 71 65 - 99 mg/dL   BUN 16 6 - 24 mg/dL   Creatinine, Ser 1.21 (H) 0.57 - 1.00 mg/dL   GFR calc non Af Amer 52 (L) >59 mL/min/1.73   GFR calc Af Amer 60 >59 mL/min/1.73   BUN/Creatinine Ratio 13 9 - 23   Sodium 136 134 - 144 mmol/L   Potassium 4.5 3.5 - 5.2 mmol/L   Chloride 98 96 - 106 mmol/L   CO2 24 20 - 29 mmol/L   Calcium 9.8 8.7 - 10.2 mg/dL   Total Protein 7.0 6.0 - 8.5 g/dL   Albumin 4.5 3.8 - 4.8 g/dL   Globulin, Total 2.5 1.5 - 4.5 g/dL   Albumin/Globulin Ratio 1.8 1.2 - 2.2   Bilirubin Total 0.5 0.0 - 1.2 mg/dL   Alkaline Phosphatase 92 48 - 121 IU/L   AST 28 0 - 40 IU/L   ALT 30 0 - 32 IU/L  TSH  Result Value Ref Range   TSH 2.100 0.450 - 4.500 uIU/mL  Vitamin B12  Result Value Ref Range   Vitamin B-12 >2000 (H) 232 - 1245 pg/mL  Sed Rate (ESR)  Result Value Ref  Range   Sed Rate 12 0 - 40 mm/hr  C-reactive protein  Result Value Ref Range   CRP 3 0 - 10 mg/L      Assessment & Plan:   Problem List Items Addressed This Visit      Other   Major depressive disorder, recurrent episode, moderate (HCC) - Primary    Chronic.  Followed by psychiatry, continue this collaboration and current medication regimen at this time.  She denies SI/HI today.  She verbally agrees to Surveyor, [employer] -- has support system locally and will reach out to them if SI presents or immediately go to ER.  At this time she reports being safe.  Return in 3 months for follow-up.      Medication monitoring encounter    EKG with NSR in office today, okay to start new medication if needed.      Relevant Orders   EKG 12-Lead (Completed)       Follow up plan: Return for as scheduled upcoming.

## 2020-09-15 ENCOUNTER — Telehealth: Payer: Self-pay

## 2020-09-15 ENCOUNTER — Other Ambulatory Visit: Payer: Self-pay | Admitting: Nurse Practitioner

## 2020-09-15 ENCOUNTER — Other Ambulatory Visit: Payer: Self-pay

## 2020-09-15 DIAGNOSIS — T148XXA Other injury of unspecified body region, initial encounter: Secondary | ICD-10-CM

## 2020-09-15 NOTE — Telephone Encounter (Signed)
Received call from [facility] where we faxed orders earlier today. They do not have a basic food allergen panel. Their shellfish panel includes crab, shrimp, lobster, and clam.   Is their shellfish panel ok? What specific foods was the patient wanting to be tested for?

## 2020-09-15 NOTE — Telephone Encounter (Signed)
Shellfish panel is fine, we will start with this and go from there.  If further testing needed, may send to allergist.:)

## 2020-09-16 NOTE — Telephone Encounter (Signed)
Raiann returned my call and was notified of Jolene's message.

## 2020-09-16 NOTE — Telephone Encounter (Signed)
Called and left Tracie James a VM asking for her to please return my call.   [phone]

## 2020-09-22 ENCOUNTER — Ambulatory Visit: Payer: 59 | Admitting: Nurse Practitioner

## 2020-09-24 ENCOUNTER — Ambulatory Visit: Payer: 59 | Admitting: Nurse Practitioner

## 2020-09-26 ENCOUNTER — Other Ambulatory Visit: Payer: Self-pay

## 2020-09-26 ENCOUNTER — Encounter: Payer: Self-pay | Admitting: Nurse Practitioner

## 2020-09-26 ENCOUNTER — Ambulatory Visit (INDEPENDENT_AMBULATORY_CARE_PROVIDER_SITE_OTHER): Payer: 59 | Admitting: Nurse Practitioner

## 2020-09-26 VITALS — BP 110/78 | HR 99 | Temp 98.1°F | Wt 216.6 lb

## 2020-09-26 DIAGNOSIS — K140 Glossitis: Secondary | ICD-10-CM | POA: Insufficient documentation

## 2020-09-26 MED ORDER — VALACYCLOVIR HCL 1 G PO TABS: 1000.0000 mg | ORAL_TABLET | Freq: Two times a day (BID) | ORAL | 4 refills | Status: DC

## 2020-09-26 MED ORDER — LIDOCAINE VISCOUS HCL 2 % MT SOLN: 15.0000 mL | OROMUCOSAL | 1 refills | Status: DC | PRN

## 2020-09-26 NOTE — Progress Notes (Signed)
BP 110/78   Pulse 99   Temp 98.1 F (36.7 C) (Oral)   Wt 216 lb 9.6 oz (98.2 kg)   LMP  (LMP Unknown)   SpO2 98%   BMI 37.67 kg/m    Subjective:    Patient ID: Tracie James, female    DOB: 1971-02-05, 50 y.o.   MRN: [id number]  HPI: Tracie James is a 50 y.o. female  Chief Complaint  Patient presents with  . Oral Pain    Patient states she had oral ulcers back in October and states the issue has yet to go away and patient states she has not eaten in a week because it hurts to swallow. Patient states she was prescribed Valtrex back then and states she still has the prescription and states she was told it isn't a virus to stop medication. Patient uses Magic Mouthwash every night and helps for only about half an hour.   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  Recently had outbreak in October which improved briefly, has not been able to eat well over past week due to discomfort -- current flare.  Reports she has Valtrex and has been taking without benefit at this time.  Has Magic Mouthwash only numbs it for 1/2 hour. Hurts when she goes to eat, underneath tongue/sides of tongue/and top part of back of throat.  Non smoker.  Lots of stressors recently.  No known food allergies.  No new medications recently.  Has seen Dr. Tami Ribas before for different ENT issues.  She takes B12 at home daily. Duration: months Severity: 5/10 at present Quality:  dull and aching Fever: no Upper respiratory infection symptoms: no Pruritus: no  Relevant past medical, surgical, family and social history reviewed and updated as indicated. Interim medical history since our last visit reviewed. Allergies and medications reviewed and updated.  Review of Systems  Constitutional: Negative for activity change, appetite change, diaphoresis, fatigue and fever.  HENT: Positive for mouth sores.   Respiratory: Negative for cough, chest tightness and shortness of breath.   Cardiovascular: Negative for  chest pain, palpitations and leg swelling.  Gastrointestinal: Negative.   Neurological: Negative.   Psychiatric/Behavioral: Negative.     Per HPI unless specifically indicated above     Objective:    BP 110/78   Pulse 99   Temp 98.1 F (36.7 C) (Oral)   Wt 216 lb 9.6 oz (98.2 kg)   LMP  (LMP Unknown)   SpO2 98%   BMI 37.67 kg/m   Wt Readings from Last 3 Encounters:  09/26/20 216 lb 9.6 oz (98.2 kg)  09/08/20 232 lb 12.8 oz (105.6 kg)  07/21/20 223 lb 12.8 oz (101.5 kg)    Physical Exam Vitals and nursing note reviewed.  Constitutional:      General: She is awake. She is not in acute distress.    Appearance: She is well-developed and well-groomed. She is obese. She is not ill-appearing or toxic-appearing.  HENT:     Head: Normocephalic.     Right Ear: Hearing normal.     Left Ear: Hearing normal.     Nose: Nose normal.     Mouth/Throat:     Lips: Pink.     Mouth: Mucous membranes are moist.     Tongue: Lesions (white ulcerations noted to lateral aspect tongue both side with erythema to tongue) present.     Pharynx: Oropharynx is clear. No pharyngeal swelling, oropharyngeal exudate or posterior oropharyngeal erythema.     Comments:  No ulcerations noted to back of throat. Eyes:     General: Lids are normal.        Right eye: No discharge.        Left eye: No discharge.     Conjunctiva/sclera: Conjunctivae normal.     Pupils: Pupils are equal, round, and reactive to light.  Neck:     Thyroid: No thyromegaly.     Vascular: No carotid bruit or JVD.  Cardiovascular:     Rate and Rhythm: Normal rate and regular rhythm.     Heart sounds: Normal heart sounds. No murmur heard. No gallop.   Pulmonary:     Effort: Pulmonary effort is normal.     Breath sounds: Normal breath sounds.  Abdominal:     General: Bowel sounds are normal.     Palpations: Abdomen is soft. There is no hepatomegaly or splenomegaly.  Musculoskeletal:     Cervical back: Normal range of motion and  neck supple.     Right lower leg: No edema.     Left lower leg: No edema.  Lymphadenopathy:     Cervical: No cervical adenopathy.  Skin:    General: Skin is warm and dry.  Neurological:     Mental Status: She is alert and oriented to person, place, and time.  Psychiatric:        Attention and Perception: Attention normal.        Mood and Affect: Mood normal.        Behavior: Behavior normal. Behavior is cooperative.        Thought Content: Thought content normal.        Judgment: Judgment normal.    Results for orders placed or performed in visit on 12/07/19  ANA w/Reflex if Positive  Result Value Ref Range   Anti Nuclear Antibody (ANA) Negative Negative  CBC with Differential/Platelet  Result Value Ref Range   WBC 8.3 3.4 - 10.8 x10E3/uL   RBC 4.71 3.77 - 5.28 x10E6/uL   Hemoglobin 13.6 11.1 - 15.9 g/dL   Hematocrit 40.2 34.0 - 46.6 %   MCV 85 79 - 97 fL   MCH 28.9 26.6 - 33.0 pg   MCHC 33.8 31.5 - 35.7 g/dL   RDW 13.6 11.7 - 15.4 %   Platelets 269 150 - 450 x10E3/uL   Neutrophils 65 Not Estab. %   Lymphs 24 Not Estab. %   Monocytes 7 Not Estab. %   Eos 2 Not Estab. %   Basos 1 Not Estab. %   Neutrophils Absolute 5.5 1.4 - 7.0 x10E3/uL   Lymphocytes Absolute 2.0 0.7 - 3.1 x10E3/uL   Monocytes Absolute 0.6 0.1 - 0.9 x10E3/uL   EOS (ABSOLUTE) 0.1 0.0 - 0.4 x10E3/uL   Basophils Absolute 0.1 0.0 - 0.2 x10E3/uL   Immature Granulocytes 1 Not Estab. %   Immature Grans (Abs) 0.1 0.0 - 0.1 x10E3/uL  Comprehensive metabolic panel  Result Value Ref Range   Glucose 71 65 - 99 mg/dL   BUN 16 6 - 24 mg/dL   Creatinine, Ser 1.21 (H) 0.57 - 1.00 mg/dL   GFR calc non Af Amer 52 (L) >59 mL/min/1.73   GFR calc Af Amer 60 >59 mL/min/1.73   BUN/Creatinine Ratio 13 9 - 23   Sodium 136 134 - 144 mmol/L   Potassium 4.5 3.5 - 5.2 mmol/L   Chloride 98 96 - 106 mmol/L   CO2 24 20 - 29 mmol/L   Calcium 9.8 8.7 - 10.2 mg/dL  Total Protein 7.0 6.0 - 8.5 g/dL   Albumin 4.5 3.8 - 4.8 g/dL    Globulin, Total 2.5 1.5 - 4.5 g/dL   Albumin/Globulin Ratio 1.8 1.2 - 2.2   Bilirubin Total 0.5 0.0 - 1.2 mg/dL   Alkaline Phosphatase 92 48 - 121 IU/L   AST 28 0 - 40 IU/L   ALT 30 0 - 32 IU/L  TSH  Result Value Ref Range   TSH 2.100 0.450 - 4.500 uIU/mL  Vitamin B12  Result Value Ref Range   Vitamin B-12 >2000 (H) 232 - 1245 pg/mL  Sed Rate (ESR)  Result Value Ref Range   Sed Rate 12 0 - 40 mm/hr  C-reactive protein  Result Value Ref Range   CRP 3 0 - 10 mg/L      Assessment & Plan:   Problem List Items Addressed This Visit      Digestive   Ulcerated tongue - Primary    Ongoing with flares, ?related to HSV1 as has had recent stressors.  Takes B12 daily.  At this time will extend Valtrex treatment and send in viscous lidocaine.  Will get in to see ENT, Dr. Tami Ribas as soon as possible due to ongoing flares.  Return in 2 weeks, sooner if worsening.      Relevant Orders   Ambulatory referral to ENT       Follow up plan: Return in about 2 weeks (around 10/10/2020) for Mouth tongues.

## 2020-09-26 NOTE — Assessment & Plan Note (Deleted)
Ongoing with flares, ?related to HSV1 as has had recent stressors.  Takes B12 daily.  At this time will extend Valtrex treatment and send in viscous lidocaine.  Will get in to see ENT, Dr. McQueen as soon as possible due to ongoing flares.  Return in 2 weeks, sooner if worsening. 

## 2020-09-26 NOTE — Assessment & Plan Note (Signed)
Ongoing with flares, ?related to HSV1 as has had recent stressors.  Takes B12 daily.  At this time will extend Valtrex treatment and send in viscous lidocaine.  Will get in to see ENT, Dr. Jenne Campus as soon as possible due to ongoing flares.  Return in 2 weeks, sooner if worsening.

## 2020-09-26 NOTE — Patient Instructions (Signed)
Oral Ulcers Oral ulcers are small sores inside the mouth or near the mouth. They may occur on or inside the lips, inside the cheeks, on the tongue, or anywhere else inside or near the mouth. They may be called canker sores or cold sores, which are two types of oral ulcers. Many oral ulcers are harmless and go away on their own. In some cases, oral ulcers may require medical care to determine the cause and proper treatment. What are the causes? Common causes of this condition include: Infections caused by viruses, bacteria, or fungi. Emotional stress. Foods or chemicals that irritate the mouth. Injury or physical irritation of the mouth. Medicines. Allergies. Tobacco use. Less common causes include: Skin disease. A type of herpes virus infection (herpes simplex or herpes zoster). Oral cancer. In some cases, the cause may not be known. What increases the risk? You are more likely to develop this condition if: You wear dental braces, dentures, or retainers. You have poor oral hygiene. You have sensitive skin. You have a condition that affects the entire body (systemic condition), such as an immune disorder. What are the signs or symptoms? The main symptom of this condition is having one or more oval-shaped or round ulcers that have red borders. Symptoms may vary depending on the cause. This includes: Location of the ulcers. Ulcers may be found inside the mouth, on the gums, or on the insides of the lips or cheeks. They may also be found on the lips or on skin that is near the mouth, such as the cheeks or chin. Pain. Ulcers can be painful and uncomfortable, or they can be painless. Appearance of the ulcers. They may look like red blisters and be filled with fluid, or they may be white or yellow patches. Frequency of outbreaks. Ulcers may go away permanently after one outbreak, or they may come back (recur) often or rarely.   How is this diagnosed? This condition is diagnosed with a physical  exam. Your health care provider may ask you questions about your lifestyle and your medical history. You may have tests, including: Blood tests. Removal of a small number of cells from an ulcer to be examined under a microscope (biopsy). How is this treated? Treatment depends on the severity and cause of the condition. Oral ulcers often go away on their own in 1-2 weeks. Treatment may include medicines, such as: Medicines to treat a viral infection (antivirals), a bacterial infection (antibiotics), or a fungal infection (antifungals). Medicines to help control pain. This may include: Over-the-counter pain medicines. Gel, cream, or spray to numb the area (topical anesthetic) if you have severe pain. Other medicines to coat or numb your mouth. Follow these instructions at home: Medicines Take or use over-the-counter and prescription medicines only as told by your health care provider. If you were prescribed an antibiotic medicine, take it as told by your health care provider. Do not stop taking the antibiotic even if you start to feel better. Do not use products that contain benzocaine (including numbing gels) to treat teething or mouth pain in children who are younger than 2 years. These products may cause a rare but serious blood condition. Eating and drinking Eat a balanced diet. Do not eat: Spicy foods. Citrus, such as oranges. Other foods and drinks that you think may cause or irritate your ulcers. Drink enough fluid to keep your urine pale yellow. Avoid drinking alcohol. Lifestyle Practice good oral hygiene: Gently brush your teeth with a soft toothbrush two times a day.   Floss your teeth every day. Get regular dental cleanings and checkups. Do not use any products that contain nicotine or tobacco, such as cigarettes and e-cigarettes. If you need help quitting, ask your health care provider.   Managing pain If directed, put ice on your face in the affected area to help reduce  pain. Put ice in a plastic bag. Place a towel between your skin and the bag. Leave the ice on for 20 minutes, 2-3 times a day. Avoid physical or chemical irritants that may have caused the ulcers or made them worse, such as mouthwashes that contain alcohol (ethanol). If you wear dental braces, dentures, or retainers, work with your health care provider to make sure these devices are fitted correctly. If you were prescribed a prescription mouthwash to help reduce pain in your mouth, use it as told by your health care provider. General instructions Rinse with a salt-water mixture 3-4 times a day or as told by your health care provider. To make a salt-water mixture, completely dissolve -1 tsp (3-6 g) of salt in 1 cup (237 mL) of warm water. Keep all follow-up visits as told by your health care provider. This is important. Contact a health care provider if: You have: Pain that gets worse or does not get better with medicine. Four or more ulcers at one time. A fever. New ulcers that look or feel different from other ulcers you have. Inflammation in one eye or both eyes. Ulcers that do not go away after 10 days. You develop new symptoms in your mouth, such as: Bleeding or crusting around your lips or gums. Tooth pain. Difficulty swallowing. You develop symptoms on your skin or genitals, such as: A rash or blisters. Burning or itching sensations. Your ulcers begin or get worse after you start a new medicine. Get help right away if you have: Difficulty breathing. Swelling in your face or neck. Excessive bleeding from your mouth. Severe pain. Summary Oral ulcers may occur anywhere inside or near the mouth. They can be caused by many things, such as infections, stress, injury or irritation, or tobacco use. Oral ulcers can be painful or painless. Treatment may include medicines to relieve pain or to treat an infection (if appropriate). Most oral ulcers go away in 1-2 weeks. This information  is not intended to replace advice given to you by your health care provider. Make sure you discuss any questions you have with your health care provider. Document Revised: 11/17/2017 Document Reviewed: 11/17/2017 [employer] Patient Education  2021 [employer]  

## 2020-10-10 ENCOUNTER — Ambulatory Visit: Payer: 59 | Admitting: Nurse Practitioner

## 2020-10-22 ENCOUNTER — Ambulatory Visit: Payer: 59 | Admitting: Nurse Practitioner

## 2020-11-03 NOTE — Progress Notes (Signed)
Virtual Visit via Telephone Note  I connected with Tracie James on 11/07/20 at  9:00 AM EDT by telephone and verified that I am speaking with the correct person using two identifiers.  Location: Patient: home Provider: office Persons participated in the visit- patient, provider   I discussed the limitations, risks, security and privacy concerns of performing an evaluation and management service by telephone and the availability of in person appointments. I also discussed with the patient that there may be a patient responsible charge related to this service. The patient expressed understanding and agreed to proceed.    I discussed the assessment and treatment plan with the patient. The patient was provided an opportunity to ask questions and all were answered. The patient agreed with the plan and demonstrated an understanding of the instructions.   The patient was advised to call back or seek an in-person evaluation if the symptoms worsen or if the condition fails to improve as anticipated.  I provided 13 minutes of non-face-to-face time during this encounter.   [name] Kasey Ewings, MD     [facility] OP Progress Note  11/07/2020 9:28 AM Tracie Claymanimee James  MRN:  [id number]  Chief Complaint:  Chief Complaint    Depression; Follow-up; Anxiety     HPI:  This is a follow-up appointment for depression and anxiety.  Tracie James states that Tracie James has not been able to talk with her daughter since January.  Tracie James was told that her daughter does not want to talk with the patient.  Tracie James does not know why Tracie James is mad at her.  Tracie James agrees that Tracie James is unsure whether her husband is telling her the truth.  Tracie James is waiting to hear from her attorney about the court day.  Although Tracie James does feel depressed about the current situation, Tracie James enjoys doing crafts.  Tracie James denies any issues at work.  Tracie James has not been able to get Tracie James due to insurance issues.  Tracie James has done EKG to start Geodon a few months ago. Tracie James has depressive  symptoms as in PHQ-9.  Although Tracie James reports passive SI, Tracie James denies any plan or intent.  Tracie James feels anxious, and has been taking lorazepam few times per week.  Tracie James denies change in weight or appetite.   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 Anxiety  F41.9 FLUoxetine (PROZAC) 40 MG capsule    LORazepam (ATIVAN) 1 MG tablet  2. MDD (major depressive disorder), recurrent episode, mild (HCC)  F33.0 FLUoxetine (PROZAC) 40 MG capsule    buPROPion (WELLBUTRIN XL) 150 MG 24 hr tablet    buPROPion (WELLBUTRIN XL) 300 MG 24 hr tablet    Past Psychiatric History: Please see initial evaluation for full details. I have reviewed the history. No updates at this time.     Past Medical History:  Past Medical History:  Diagnosis Date  . Anxiety   . Asthma   . Bipolar 2 disorder (HCC)   . Depression   . GERD (gastroesophageal reflux disease)   . Palpitations   . PVC (premature ventricular contraction)     Past Surgical History:  Procedure Laterality Date  . ABDOMINAL HYSTERECTOMY    . APPENDECTOMY      Family Psychiatric History: Please see initial evaluation for full details. I have reviewed the history. No updates at this time.     Family History:  Family History  Problem Relation Age of Onset  . Depression Mother   . Multiple sclerosis Mother   . Schizophrenia Maternal Grandmother   . Heart disease Maternal Grandmother        aortic valve replacement    Social History:  Social History   Socioeconomic History  . Marital status: Divorced    Spouse name: Not on file  . Number of children: Not on file  . Years of education: Not on file  . Highest education level: Not on file  Occupational History  . Not on file  Tobacco Use  . Smoking status: Former Smoker    Quit date: 03/31/2014    Years since  quitting: 6.6  . Smokeless tobacco: Never Used  Vaping Use  . Vaping Use: Never used  Substance and Sexual Activity  . Alcohol use: No  . Drug use: No  . Sexual activity: Yes    Partners: Male    Birth control/protection: Surgical  Other Topics Concern  . Not on file  Social History Narrative  . Not on file   Social Determinants of Health   Financial Resource Strain: Not on file  Food Insecurity: Not on file  Transportation Needs: Not on file  Physical Activity: Not on file  Stress: Not on file  Social Connections: Not on file    Allergies:  Allergies  Allergen Reactions  . Erythromycin Hives  . Tetracyclines & Related Hives    Metabolic Disorder Labs: No results found for: HGBA1C, MPG No results found for: PROLACTIN No results found for: CHOL, TRIG, HDL, CHOLHDL, VLDL, LDLCALC Lab Results  Component Value Date   TSH 2.100 12/07/2019   TSH 2.360 05/03/2016    Therapeutic Level Labs: No results found for: LITHIUM No results found for: VALPROATE No components found for:  CBMZ  Current Medications: Current Outpatient Medications  Medication Sig Dispense Refill  . ziprasidone (GEODON) 20 MG capsule Take 1 capsule (20 mg total) by mouth 2 (two) times daily with a meal. 60 capsule 1  . [START ON 11/20/2020] buPROPion (WELLBUTRIN XL) 150 MG 24 hr tablet Take 300 mg daily and 150 mg daily (total of 450 mg) 90 tablet 1  . [START ON 11/20/2020] buPROPion (WELLBUTRIN XL) 300 MG 24 hr tablet Take 300 mg daily and 150 mg daily (total of 450 mg) 90 tablet 0  . cyclobenzaprine (FLEXERIL) 10 MG tablet Take 1 tablet (10 mg total) by mouth 3 (three) times daily as needed for muscle spasms. (Patient not taking: Reported on 09/08/2020) 30 tablet 0  . FLUoxetine (PROZAC) 40 MG capsule Take 2 capsules (80 mg total) by mouth daily. 180 capsule 1  . gabapentin (NEURONTIN) 300 MG capsule Take 1 capsule (300 mg total) by mouth 3 (three) times daily. 270 capsule 4  . lansoprazole (PREVACID)  15 MG capsule Take 15 mg by mouth daily at 12 noon.    . lidocaine (XYLOCAINE) 2 % solution Use as directed 15 mLs in the mouth or throat as needed for mouth pain. 100 mL 1  . LORazepam (ATIVAN) 1 MG tablet Take 1 tablet (1 mg total) by mouth daily as needed for anxiety. 30 tablet 2  . lurasidone (LATUDA) 80 MG TABS tablet Take 1 tablet (80 mg total) by mouth daily with breakfast. (Patient not taking: Reported on 09/08/2020) 90 tablet 0  . meloxicam (MOBIC) 15 MG tablet TAKE 1 TABLET BY MOUTH EVERY DAY 90 tablet 2  . metoprolol succinate (TOPROL-XL) 50 MG 24 hr tablet  Take with or immediately following a meal. (Patient not taking: No sig reported) 90 tablet 3  . predniSONE (DELTASONE) 10 MG tablet Take 6 tablets by mouth daily for 2 days, then reduce by 1 tablet every 2 days until gone (Patient not taking: Reported on 09/08/2020) 42 tablet 0  . triamcinolone (KENALOG) 0.1 % paste SMARTSIG:TO TEETH (Patient not taking: Reported on 09/08/2020)    . valACYclovir (VALTREX) 1000 MG tablet Take 1 tablet (1,000 mg total) by mouth 2 (two) times daily. 60 tablet 4  . vitamin B-12 (CYANOCOBALAMIN) 1000 MCG tablet Take 5,000 mcg by mouth daily.     . Vitamin D, Cholecalciferol, 1000 UNITS TABS Take 5,000 Units by mouth daily.      No current facility-administered medications for this visit.     Musculoskeletal: Strength & Muscle Tone: N/A Gait & Station: N/A Patient leans: N/A  Psychiatric Specialty Exam: Review of Systems  Psychiatric/Behavioral: Positive for dysphoric mood. Negative for agitation, behavioral problems, confusion, decreased concentration, hallucinations, self-injury, sleep disturbance and suicidal ideas. The patient is nervous/anxious. The patient is not hyperactive.   All other systems reviewed and are negative.   There were no vitals taken for this visit.There is no height or weight on file to calculate BMI.  General Appearance: NA  Eye Contact:  NA  Speech:  Clear and Coherent   Volume:  Normal  Mood:  Depressed  Affect:  NA  Thought Process:  Coherent  Orientation:  Full (Time, Place, and Person)  Thought Content: Logical   Suicidal Thoughts:  Yes.  without intent/plan  Homicidal Thoughts:  No  Memory:  Immediate;   Good  Judgement:  Good  Insight:  Good  Psychomotor Activity:  Normal  Concentration:  Concentration: Good and Attention Span: Good  Recall:  Good  Fund of Knowledge: Good  Language: Good  Akathisia:  No  Handed:  Right  AIMS (if indicated): not done  Assets:  Communication Skills Desire for Improvement  ADL's:  Intact  Cognition: WNL  Sleep:  Fair   Screenings: GAD-7   Flowsheet Row Office Visit from 07/21/2020 in [facility] Office Visit from 01/01/2020 in [facility] Office Visit from 06/20/2019 in [facility] Office Visit from 05/09/2018 in [facility]  Total GAD-7 Score 21 21 21 21     PHQ2-9   Flowsheet Row Video Visit from 11/07/2020 in [facility] Office Visit from 07/21/2020 in [facility] Office Visit from 01/01/2020 in [facility] Office Visit from 06/20/2019 in [facility] Office Visit from 05/09/2018 in [facility]  PHQ-2 Total Score 2 1 6 6 4   PHQ-9 Total Score 4 2 23 15 11     Flowsheet Row Video Visit from 11/07/2020 in [facility]  C-SSRS RISK CATEGORY Error: Q3, 4, or 5 should not be populated when Q2 is No       Assessment and Plan:  Janira Mandell is a 50 y.o. year old female with a history of depression, who presents for follow up appointment for below.   1. Anxiety 2. MDD (major depressive disorder), recurrent episode, mild (HCC) Tracie James continues to report occasional depressive symptoms and anxiety in the context of not being able to talk with her daughter since January.  Other psychosocial stressors includes upcoming court regarding custody.   Will start Geodon  as adjunctive treatment for depression.  Discussed potential metabolic side effect and EPS.  Will continue fluoxetine and bupropion to target depression  and anxiety.  Will continue lorazepam as needed for anxiety.   This clinician has discussed the side effect associated with medication prescribed during this encounter. Please refer to notes in the previous encounters for more details.   Plan 1. Continue fluoxetine 80 mg daily 2. ContinueBupropion450 mg daily 3.Start Geodon 20 mg twice a day - EKG on 08/2020 450s msec 4 .Continue lorazepam 1 mg as needed for anxiety 5.Next appointment:6/13 at 1:72for30 mins, in person - Tracie James sees a therapist once a month  .Emergency resources which includes 911, ED, suicide crisis line [phone]) are discussed.   I have reviewed suicide assessment in detail. No change in the following assessment.   The patient demonstrates the following risk factors for suicide: Chronic risk factors for suicide include: psychiatric disorder of depressionand previous suicide attempts of overdosing medication. Acute risk factorsfor suicide include: family or marital conflict. Protective factorsfor this patient include: responsibility to others (children, family), coping skills and hope for the future. Patient is future oriented and agrees to come for next appointment.Considering these factors, the overall suicide risk at this point appears to be low.Tracie James is future oriented, and is amenable to treatment.Patient isappropriate for outpatient followup.  [name], MD 11/07/2020, 9:28 AM

## 2020-11-07 ENCOUNTER — Telehealth (INDEPENDENT_AMBULATORY_CARE_PROVIDER_SITE_OTHER): Payer: 59 | Admitting: Psychiatry

## 2020-11-07 ENCOUNTER — Other Ambulatory Visit: Payer: Self-pay

## 2020-11-07 ENCOUNTER — Encounter: Payer: Self-pay | Admitting: Psychiatry

## 2020-11-07 DIAGNOSIS — F330 Major depressive disorder, recurrent, mild: Secondary | ICD-10-CM

## 2020-11-07 DIAGNOSIS — F419 Anxiety disorder, unspecified: Secondary | ICD-10-CM | POA: Diagnosis not present

## 2020-11-07 MED ORDER — LORAZEPAM 1 MG PO TABS: 1.0000 mg | ORAL_TABLET | Freq: Every day | ORAL | 2 refills | Status: DC | PRN

## 2020-11-07 MED ORDER — BUPROPION HCL ER (XL) 150 MG PO TB24
ORAL_TABLET | ORAL | 1 refills | Status: DC
Start: 2020-11-20 — End: 2021-06-03

## 2020-11-07 MED ORDER — ZIPRASIDONE HCL 20 MG PO CAPS: 20.0000 mg | ORAL_CAPSULE | Freq: Two times a day (BID) | ORAL | 1 refills | Status: DC

## 2020-11-07 MED ORDER — BUPROPION HCL ER (XL) 300 MG PO TB24
ORAL_TABLET | ORAL | 0 refills | Status: DC
Start: 2020-11-20 — End: 2021-01-21

## 2020-11-07 MED ORDER — FLUOXETINE HCL 40 MG PO CAPS: 80.0000 mg | ORAL_CAPSULE | Freq: Every day | ORAL | 1 refills | Status: DC

## 2020-11-07 NOTE — Patient Instructions (Addendum)
1. Continue fluoxetine 80 mg daily 2. ContinueBupropion450 mg daily 3.Start Geodon 20 mg twice a day 4 .Continue lorazepam 1 mg as needed for anxiety 5.Next appointment:6/13 at 1:20 The next visit will be in person visit. Please arrive 15 mins before the scheduled time.   [facility]  Address: [address], [place], [place] [address]   CONTACT INFORMATION  What to do if you need to get in touch with someone regarding a psychiatric issue:  1. EMERGENCY: For psychiatric emergencies (if you are suicidal or if there are any other safety issues) call 911 and/or go to your nearest Emergency Room immediately.   2. IF YOU NEED SOMEONE TO TALK TO RIGHT NOW: Given my clinical responsibilities, I may not be able to speak with you over the phone for a prolonged period of time.  A. You may always call The National Suicide Prevention Lifeline at 1-800-273-TALK 980-299-9320).  B. You may walk in to [facility]  Address: [address] [place], [place] [address], Phone: [phone].  Open 24/7, No appointment required. C. Your county of residence will also have local crisis services. For [facility]: [facility] at [phone] (24 Hour Crisis Hotline)

## 2020-11-19 ENCOUNTER — Telehealth: Payer: Self-pay

## 2020-11-19 NOTE — Telephone Encounter (Signed)
It was ordered for 90 days

## 2020-11-19 NOTE — Telephone Encounter (Signed)
Received fax requesting a 90 day supply instead of a 60 day supply     FLUoxetine (PROZAC) 40 MG capsule Medication Date: 11/07/2020 Department: [facility] Ordering/Authorizing: [name], MD    Order Providers  Prescribing Provider Encounter Provider  [name], MD [name], MD   Outpatient Medication Detail   Disp Refills Start End   FLUoxetine (PROZAC) 40 MG capsule 180 capsule 1 11/07/2020 05/06/2021   Sig - Route: Take 2 capsules (80 mg total) by mouth daily. - Oral   Sent to pharmacy as: FLUoxetine (PROZAC) 40 MG capsule   E-Prescribing Status: Receipt confirmed by pharmacy (11/07/2020 9:21 AM EDT)    Associated Diagnoses  Anxiety     MDD (major depressive disorder), recurrent episode, mild (HCC)

## 2020-11-24 ENCOUNTER — Telehealth: Payer: Self-pay

## 2020-11-24 NOTE — Telephone Encounter (Signed)
fax was received that [facility] only refill in a 90 day supply for the fluoxetine hcl

## 2020-11-24 NOTE — Telephone Encounter (Signed)
It was ordered 90 days

## 2020-12-18 NOTE — Progress Notes (Deleted)
BH MD/PA/NP OP Progress Note  12/18/2020 2:13 PM Tracie James  MRN:  [id number]  Chief Complaint:  HPI: *** Visit Diagnosis: No diagnosis found.  Past Psychiatric History: Please see initial evaluation for full details. I have reviewed the history. No updates at this time.     Past Medical History:  Past Medical History:  Diagnosis Date  . Anxiety   . Asthma   . Bipolar 2 disorder (HCC)   . Depression   . GERD (gastroesophageal reflux disease)   . Palpitations   . PVC (premature ventricular contraction)     Past Surgical History:  Procedure Laterality Date  . ABDOMINAL HYSTERECTOMY    . APPENDECTOMY      Family Psychiatric History: Please see initial evaluation for full details. I have reviewed the history. No updates at this time.     Family History:  Family History  Problem Relation Age of Onset  . Depression Mother   . Multiple sclerosis Mother   . Schizophrenia Maternal Grandmother   . Heart disease Maternal Grandmother        aortic valve replacement    Social History:  Social History   Socioeconomic History  . Marital status: Divorced    Spouse name: Not on file  . Number of children: Not on file  . Years of education: Not on file  . Highest education level: Not on file  Occupational History  . Not on file  Tobacco Use  . Smoking status: Former Smoker    Quit date: 03/31/2014    Years since quitting: 6.7  . Smokeless tobacco: Never Used  Vaping Use  . Vaping Use: Never used  Substance and Sexual Activity  . Alcohol use: No  . Drug use: No  . Sexual activity: Yes    Partners: Male    Birth control/protection: Surgical  Other Topics Concern  . Not on file  Social History Narrative  . Not on file   Social Determinants of Health   Financial Resource Strain: Not on file  Food Insecurity: Not on file  Transportation Needs: Not on file  Physical Activity: Not on file  Stress: Not on file  Social Connections: Not on file    Allergies:   Allergies  Allergen Reactions  . Erythromycin Hives  . Tetracyclines & Related Hives    Metabolic Disorder Labs: No results found for: HGBA1C, MPG No results found for: PROLACTIN No results found for: CHOL, TRIG, HDL, CHOLHDL, VLDL, LDLCALC Lab Results  Component Value Date   TSH 2.100 12/07/2019   TSH 2.360 05/03/2016    Therapeutic Level Labs: No results found for: LITHIUM No results found for: VALPROATE No components found for:  CBMZ  Current Medications: Current Outpatient Medications  Medication Sig Dispense Refill  . buPROPion (WELLBUTRIN XL) 150 MG 24 hr tablet Take 300 mg daily and 150 mg daily (total of 450 mg) 90 tablet 1  . buPROPion (WELLBUTRIN XL) 300 MG 24 hr tablet Take 300 mg daily and 150 mg daily (total of 450 mg) 90 tablet 0  . cyclobenzaprine (FLEXERIL) 10 MG tablet Take 1 tablet (10 mg total) by mouth 3 (three) times daily as needed for muscle spasms. (Patient not taking: Reported on 09/08/2020) 30 tablet 0  . FLUoxetine (PROZAC) 40 MG capsule Take 2 capsules (80 mg total) by mouth daily. 180 capsule 1  . gabapentin (NEURONTIN) 300 MG capsule Take 1 capsule (300 mg total) by mouth 3 (three) times daily. 270 capsule 4  . lansoprazole (PREVACID)  15 MG capsule Take 15 mg by mouth daily at 12 noon.    . lidocaine (XYLOCAINE) 2 % solution Use as directed 15 mLs in the mouth or throat as needed for mouth pain. 100 mL 1  . LORazepam (ATIVAN) 1 MG tablet Take 1 tablet (1 mg total) by mouth daily as needed for anxiety. 30 tablet 2  . lurasidone (LATUDA) 80 MG TABS tablet Take 1 tablet (80 mg total) by mouth daily with breakfast. (Patient not taking: Reported on 09/08/2020) 90 tablet 0  . meloxicam (MOBIC) 15 MG tablet TAKE 1 TABLET BY MOUTH EVERY DAY 90 tablet 2  . metoprolol succinate (TOPROL-XL) 50 MG 24 hr tablet Take with or immediately following a meal. (Patient not taking: No sig reported) 90 tablet 3  . predniSONE (DELTASONE) 10 MG tablet Take 6 tablets by mouth  daily for 2 days, then reduce by 1 tablet every 2 days until gone (Patient not taking: Reported on 09/08/2020) 42 tablet 0  . triamcinolone (KENALOG) 0.1 % paste SMARTSIG:TO TEETH (Patient not taking: Reported on 09/08/2020)    . valACYclovir (VALTREX) 1000 MG tablet Take 1 tablet (1,000 mg total) by mouth 2 (two) times daily. 60 tablet 4  . vitamin B-12 (CYANOCOBALAMIN) 1000 MCG tablet Take 5,000 mcg by mouth daily.     . Vitamin D, Cholecalciferol, 1000 UNITS TABS Take 5,000 Units by mouth daily.     . ziprasidone (GEODON) 20 MG capsule Take 1 capsule (20 mg total) by mouth 2 (two) times daily with a meal. 60 capsule 1   No current facility-administered medications for this visit.     Musculoskeletal: Strength & Muscle Tone: N/A Gait & Station: N/A Patient leans: N/A  Psychiatric Specialty Exam: Review of Systems  There were no vitals taken for this visit.There is no height or weight on file to calculate BMI.  General Appearance: {Appearance:22683}  Eye Contact:  {BHH EYE CONTACT:22684}  Speech:  Clear and Coherent  Volume:  Normal  Mood:  {BHH MOOD:22306}  Affect:  {Affect (PAA):22687}  Thought Process:  Coherent  Orientation:  Full (Time, Place, and Person)  Thought Content: Logical   Suicidal Thoughts:  {ST/HT (PAA):22692}  Homicidal Thoughts:  {ST/HT (PAA):22692}  Memory:  Immediate;   Good  Judgement:  {Judgement (PAA):22694}  Insight:  {Insight (PAA):22695}  Psychomotor Activity:  Normal  Concentration:  Concentration: Good and Attention Span: Good  Recall:  Good  Fund of Knowledge: Good  Language: Good  Akathisia:  No  Handed:  Right  AIMS (if indicated): not done  Assets:  Communication Skills Desire for Improvement  ADL's:  Intact  Cognition: WNL  Sleep:  {BHH GOOD/FAIR/POOR:22877}   Screenings: GAD-7   Flowsheet Row Office Visit from 07/21/2020 in [facility] Office Visit from 01/01/2020 in [facility] Office Visit from 06/20/2019 in  [facility] Office Visit from 05/09/2018 in [facility]  Total GAD-7 Score 21 21 21 21     PHQ2-9   Flowsheet Row Video Visit from 11/07/2020 in [facility] Office Visit from 07/21/2020 in [facility] Office Visit from 01/01/2020 in [facility] Office Visit from 06/20/2019 in [facility] Office Visit from 05/09/2018 in [facility]  PHQ-2 Total Score 2 1 6 6 4   PHQ-9 Total Score 4 2 23 15 11     Flowsheet Row Video Visit from 11/07/2020 in [facility]  C-SSRS RISK CATEGORY Error: Q3, 4, or 5 should not be populated  when Q2 is No       Assessment and Plan:  Tracie James is a 50 y.o. year old female with a history of  depression, who presents for follow up appointment for below.   1. Anxiety 2. MDD (major depressive disorder), recurrent episode, mild (HCC) She continues to report occasional depressive symptoms and anxiety in the context of not being able to talk with her daughter since January.  Other psychosocial stressors includes upcoming court regarding custody.  Will start Geodon as adjunctive treatment for depression.  Discussed potential metabolic side effect and EPS.  Will continue fluoxetine and bupropion to target depression and anxiety.  Will continue lorazepam as needed for anxiety.   This clinician has discussed the side effect associated with medication prescribed during this encounter. Please refer to notes in the previous encounters for more details.   Plan 1. Continue fluoxetine 80 mg daily 2. ContinueBupropion450 mg daily 3.Start Geodon 20 mg twice a day - EKG on 08/2020 450s msec 4 .Continue lorazepam 1 mg as needed for anxiety 5.Next appointment:6/13 at 1:75for30 mins, in person -She sees a therapist once a month  The patient demonstrates the following risk factors for suicide: Chronic risk factors for suicide include:  psychiatric disorder of depressionand previous suicide attempts of overdosing medication. Acute risk factorsfor suicide include: family or marital conflict. Protective factorsfor this patient include: responsibility to others (children, family), coping skills and hope for the future. Patient is future oriented and agrees to come for next appointment.Considering these factors, the overall suicide risk at this point appears to be low.She is future oriented, and is amenable to treatment.Patient isappropriate for outpatient followup.   [name], MD 12/18/2020, 2:13 PM

## 2020-12-25 ENCOUNTER — Other Ambulatory Visit: Payer: Self-pay | Admitting: Nurse Practitioner

## 2020-12-25 DIAGNOSIS — R002 Palpitations: Secondary | ICD-10-CM

## 2020-12-25 DIAGNOSIS — F419 Anxiety disorder, unspecified: Secondary | ICD-10-CM

## 2020-12-25 NOTE — Progress Notes (Signed)
Lab request

## 2020-12-29 ENCOUNTER — Ambulatory Visit: Payer: 59 | Admitting: Psychiatry

## 2020-12-30 LAB — TSH: TSH: 1.53 (ref 0.41–5.90)

## 2020-12-30 LAB — BASIC METABOLIC PANEL
BUN: 12 (ref 4–21)
CO2: 23 — AB (ref 13–22)
Chloride: 104 (ref 99–108)
Creatinine: 1.1 (ref 0.5–1.1)
Glucose: 84
Potassium: 3.9 (ref 3.4–5.3)
Sodium: 138 (ref 137–147)

## 2020-12-30 LAB — COMPREHENSIVE METABOLIC PANEL
Albumin: 4 (ref 3.5–5.0)
Calcium: 8.9 (ref 8.7–10.7)
GFR calc Af Amer: 71
GFR calc non Af Amer: 61
Globulin: 2.4

## 2020-12-30 LAB — CBC AND DIFFERENTIAL
HCT: 37 (ref 36–46)
Hemoglobin: 11.8 — AB (ref 12.0–16.0)
Platelets: 249 (ref 150–399)
WBC: 5.7

## 2020-12-30 LAB — CBC: RBC: 4.32 (ref 3.87–5.11)

## 2020-12-30 LAB — HEPATIC FUNCTION PANEL
ALT: 29 (ref 7–35)
AST: 24 (ref 13–35)
Alkaline Phosphatase: 81 (ref 25–125)
Bilirubin, Total: 0.3

## 2021-01-01 ENCOUNTER — Encounter: Payer: Self-pay | Admitting: Nurse Practitioner

## 2021-01-01 ENCOUNTER — Ambulatory Visit (INDEPENDENT_AMBULATORY_CARE_PROVIDER_SITE_OTHER): Payer: 59 | Admitting: Nurse Practitioner

## 2021-01-01 ENCOUNTER — Other Ambulatory Visit: Payer: Self-pay

## 2021-01-01 VITALS — BP 144/82 | HR 96 | Temp 97.8°F | Ht 63.5 in | Wt 215.2 lb

## 2021-01-01 DIAGNOSIS — R002 Palpitations: Secondary | ICD-10-CM | POA: Diagnosis not present

## 2021-01-01 MED ORDER — METOPROLOL SUCCINATE ER 50 MG PO TB24: ORAL_TABLET | ORAL | 1 refills | Status: DC

## 2021-01-01 NOTE — Progress Notes (Signed)
Acute Office Visit  Subjective:    Patient ID: Tracie James, female    DOB: [birth year], 50 y.o.   MRN: [id number]  Chief Complaint  Patient presents with   Palpitations    Pt states she thinks she may be having PVC's again. 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 She was taking Toprol XL 50mg  daily. She stopped taking it when she was out of refills and then she never picked it up and didn't notice any difference in symptoms until last month. Of note, she has been under increased stress recently and was changed from latuda to geodon.   PALPITATIONS  Duration: 1 month Symptom description: feels like going down on a roller coaster Duration of episode:  until she falls asleep or starts activity Frequency: recurrentl Activity when event occurred: rest Related to exertion: no Dyspnea: no Chest pain: no Syncope: no Anxiety/stress: yes Nausea/vomiting: no Diaphoresis: no Coronary artery disease: no Congestive heart failure: no Arrhythmia:no Thyroid disease: no Caffeine intake: Heavy caffeine consumption ~6 diet dr peppers every day plus 5 hour energy every day Status:  worse Treatments attempted:none   Past Medical History:  Diagnosis Date   Anxiety    Asthma    Bipolar 2 disorder (HCC)    Depression    GERD (gastroesophageal reflux disease)    Palpitations    PVC (premature ventricular contraction)     Past Surgical History:  Procedure Laterality Date   ABDOMINAL HYSTERECTOMY     APPENDECTOMY      Family History  Problem Relation Age of Onset   Depression Mother    Multiple sclerosis Mother    Schizophrenia Maternal Grandmother    Heart disease Maternal Grandmother        aortic valve replacement    Social History   Socioeconomic History   Marital status: Divorced    Spouse name: Not on file   Number of children: Not on file   Years of education: Not on file   Highest education level:  Not on file  Occupational History   Not on file  Tobacco Use   Smoking status: Former    Pack years: 0.00    Types: Cigarettes    Quit date: 03/31/2014    Years since quitting: 6.7   Smokeless tobacco: Never  Vaping Use   Vaping Use: Never used  Substance and Sexual Activity   Alcohol use: No   Drug use: No   Sexual activity: Yes    Partners: Male    Birth control/protection: Surgical  Other Topics Concern   Not on file  Social History Narrative   Not on file   Social Determinants of Health   Financial Resource Strain: Not on file  Food Insecurity: Not on file  Transportation Needs: Not on file  Physical Activity: Not on file  Stress: Not on file  Social Connections: Not on file  Intimate Partner Violence: Not on file    Outpatient Medications Prior to Visit  Medication Sig Dispense Refill   buPROPion (WELLBUTRIN XL) 150 MG 24 hr tablet Take 300 mg daily and 150 mg daily (total of 450 mg) 90 tablet 1   buPROPion (WELLBUTRIN XL) 300 MG 24 hr tablet Take 300 mg daily and 150 mg daily (total of 450 mg) 90 tablet 0   CINNAMON PO Take 1,000 mg by mouth daily.     FLUoxetine (PROZAC) 40 MG capsule Take  2 capsules (80 mg total) by mouth daily. 180 capsule 1   gabapentin (NEURONTIN) 300 MG capsule Take 1 capsule (300 mg total) by mouth 3 (three) times daily. 270 capsule 4   lansoprazole (PREVACID) 15 MG capsule Take 15 mg by mouth daily at 12 noon.     LORazepam (ATIVAN) 1 MG tablet Take 1 tablet (1 mg total) by mouth daily as needed for anxiety. 30 tablet 2   meloxicam (MOBIC) 15 MG tablet TAKE 1 TABLET BY MOUTH EVERY DAY 90 tablet 2   valACYclovir (VALTREX) 1000 MG tablet Take 1 tablet (1,000 mg total) by mouth 2 (two) times daily. 60 tablet 4   vitamin B-12 (CYANOCOBALAMIN) 1000 MCG tablet Take 5,000 mcg by mouth daily.      Vitamin D, Cholecalciferol, 1000 UNITS TABS Take 5,000 Units by mouth daily.      ziprasidone (GEODON) 20 MG capsule Take 1 capsule (20 mg total) by  mouth 2 (two) times daily with a meal. 60 capsule 1   cyclobenzaprine (FLEXERIL) 10 MG tablet Take 1 tablet (10 mg total) by mouth 3 (three) times daily as needed for muscle spasms. (Patient not taking: Reported on 09/08/2020) 30 tablet 0   lidocaine (XYLOCAINE) 2 % solution Use as directed 15 mLs in the mouth or throat as needed for mouth pain. 100 mL 1   lurasidone (LATUDA) 80 MG TABS tablet Take 1 tablet (80 mg total) by mouth daily with breakfast. (Patient not taking: Reported on 09/08/2020) 90 tablet 0   metoprolol succinate (TOPROL-XL) 50 MG 24 hr tablet Take with or immediately following a meal. (Patient not taking: No sig reported) 90 tablet 3   predniSONE (DELTASONE) 10 MG tablet Take 6 tablets by mouth daily for 2 days, then reduce by 1 tablet every 2 days until gone (Patient not taking: Reported on 09/08/2020) 42 tablet 0   triamcinolone (KENALOG) 0.1 % paste SMARTSIG:TO TEETH (Patient not taking: Reported on 09/08/2020)     No facility-administered medications prior to visit.    Allergies  Allergen Reactions   Erythromycin Hives   Tetracyclines & Related Hives    Review of Systems  Constitutional:  Positive for fatigue. Negative for fever.  HENT:  Positive for congestion and rhinorrhea. Negative for ear pain and sore throat.   Eyes: Negative.   Respiratory: Negative.    Cardiovascular:  Positive for palpitations and leg swelling (chronic, few years). Negative for chest pain.  Gastrointestinal: Negative.   Genitourinary: Negative.   Musculoskeletal:  Positive for arthralgias (knuckles, toes, and hips, chronic).  Skin: Negative.   Neurological:  Positive for dizziness (at times).  Psychiatric/Behavioral:  The patient is nervous/anxious.       Objective:    Physical Exam Vitals and nursing note reviewed.  Constitutional:      General: She is not in acute distress.    Appearance: Normal appearance.  HENT:     Head: Normocephalic.  Eyes:     Conjunctiva/sclera:  Conjunctivae normal.  Cardiovascular:     Rate and Rhythm: Normal rate and regular rhythm.     Pulses: Normal pulses.     Heart sounds: Normal heart sounds.  Pulmonary:     Effort: Pulmonary effort is normal.     Breath sounds: Normal breath sounds.  Musculoskeletal:     Cervical back: Normal range of motion.  Skin:    General: Skin is warm.  Neurological:     General: No focal deficit present.     Mental Status: She  is alert and oriented to person, place, and time.  Psychiatric:        Mood and Affect: Mood normal.        Behavior: Behavior normal.        Thought Content: Thought content normal.        Judgment: Judgment normal.    BP (!) 144/82   Pulse 96   Temp 97.8 F (36.6 C) (Oral)   Ht 5' 3.5" (1.613 m)   Wt 215 lb 3.2 oz (97.6 kg)   LMP  (LMP Unknown)   SpO2 97%   BMI 37.52 kg/m  Wt Readings from Last 3 Encounters:  01/01/21 215 lb 3.2 oz (97.6 kg)  09/26/20 216 lb 9.6 oz (98.2 kg)  09/08/20 232 lb 12.8 oz (105.6 kg)    Health Maintenance Due  Topic Date Due   COVID-19 Vaccine (1) Never done   HIV Screening  Never done   Hepatitis C Screening  Never done   COLONOSCOPY (Pts 45-1yrs Insurance coverage will need to be confirmed)  Never done   MAMMOGRAM  Never done   Zoster Vaccines- Shingrix (1 of 2) Never done   COLON CANCER SCREENING ANNUAL FOBT  10/27/2020    There are no preventive care reminders to display for this patient.   Lab Results  Component Value Date   TSH 2.100 12/07/2019   Lab Results  Component Value Date   WBC 8.3 12/07/2019   HGB 13.6 12/07/2019   HCT 40.2 12/07/2019   MCV 85 12/07/2019   PLT 269 12/07/2019   Lab Results  Component Value Date   NA 136 12/07/2019   K 4.5 12/07/2019   CO2 24 12/07/2019   GLUCOSE 71 12/07/2019   BUN 16 12/07/2019   CREATININE 1.21 (H) 12/07/2019   BILITOT 0.5 12/07/2019   ALKPHOS 92 12/07/2019   AST 28 12/07/2019   ALT 30 12/07/2019   PROT 7.0 12/07/2019   ALBUMIN 4.5 12/07/2019    CALCIUM 9.8 12/07/2019   No results found for: CHOL No results found for: HDL No results found for: LDLCALC No results found for: TRIG No results found for: CHOLHDL No results found for: OVFI4P     Assessment & Plan:   Problem List Items Addressed This Visit       Other   Palpitations - Primary    EKG done today shows normal sinus rhythm with no PVCs. Heart beat regular while auscultating for several minutes. She had a holter monitor done in 2009 which showed frequent PVCs. She was on Toprol XL, however didn't refill her prescription and didn't notice symptoms until last month. She has been under increased stress recently at home and was changed from latuda to geodon. Will restart Toprol XL 50mg  daily. Encouraged her to reach out to her psychiatrist since this occurred around the time of starting geodon. Will refer to cardiology as well as she hasn't been in 13 years. [facility], calling to get results faxed over. Per patient her electrolytes, blood counts, and thyroid was normal. Follow-up in 3 months.        Relevant Orders   EKG 12-Lead (Completed)   Ambulatory referral to Cardiology     Meds ordered this encounter  Medications   metoprolol succinate (TOPROL-XL) 50 MG 24 hr tablet    Sig: Take with or immediately following a meal.    Dispense:  90 tablet    Refill:  1    A total of 30 minutes  were spent on this encounter today. When total time is documented, this includes both the face-to-face and non-face-to-face time personally spent before, during and after the visit on the date of the encounter.   Gerre ScullLauren A Soffia Doshier, NP

## 2021-01-01 NOTE — Assessment & Plan Note (Addendum)
EKG done today shows normal sinus rhythm with no PVCs. Heart beat regular while auscultating for several minutes. She had a holter monitor done in 2009 which showed frequent PVCs. She was on Toprol XL, however didn't refill her prescription and didn't notice symptoms until last month. She has been under increased stress recently at home and was changed from latuda to geodon. Will restart Toprol XL 50mg  daily. Encouraged her to reach out to her psychiatrist since this occurred around the time of starting geodon. Will refer to cardiology as well as she hasn't been in 13 years. Labs [facility] done at [facility], calling to get results faxed over. Per patient her electrolytes, blood counts, and thyroid was normal. Follow-up in 3 months.

## 2021-01-01 NOTE — Progress Notes (Signed)
EKG interpreted by me on 01/01/21 showed normal sinus rhythm with a heart rate of 86. No PVCs noted on this strip.

## 2021-01-09 ENCOUNTER — Ambulatory Visit: Payer: 59 | Admitting: Nurse Practitioner

## 2021-01-12 ENCOUNTER — Other Ambulatory Visit: Payer: Self-pay | Admitting: Psychiatry

## 2021-01-12 ENCOUNTER — Telehealth: Payer: Self-pay

## 2021-01-12 MED ORDER — ZIPRASIDONE HCL 20 MG PO CAPS: 20.0000 mg | ORAL_CAPSULE | Freq: Two times a day (BID) | ORAL | 0 refills | Status: DC

## 2021-01-12 NOTE — Telephone Encounter (Signed)
ordered

## 2021-01-12 NOTE — Telephone Encounter (Signed)
pt called left message that she needs a reill on the ziprasidone

## 2021-01-16 ENCOUNTER — Other Ambulatory Visit: Payer: Self-pay | Admitting: Nurse Practitioner

## 2021-01-16 NOTE — Progress Notes (Signed)
Virtual Visit via Telephone Note  I connected with Tracie James on 01/21/21 at  8:00 AM EDT by telephone and verified that I am speaking with the correct person using two identifiers.  Location: Patient: home Provider: office Persons participated in the visit- patient, provider    I discussed the limitations, risks, security and privacy concerns of performing an evaluation and management service by telephone and the availability of in person appointments. I also discussed with the patient that there may be a patient responsible charge related to this service. The patient expressed understanding and agreed to proceed.   I discussed the assessment and treatment plan with the patient. The patient was provided an opportunity to ask questions and all were answered. The patient agreed with the plan and demonstrated an understanding of the instructions.   The patient was advised to call back or seek an in-person evaluation if the symptoms worsen or if the condition fails to improve as anticipated.  I provided 12 minutes of non-face-to-face time during this encounter.   Tracie Hotter, MD     [facility] MD/PA/NP OP Progress Note  01/21/2021 8:09 AM Tracie James  MRN:  [id number]  Chief Complaint:  Chief Complaint   Follow-up; Depression    HPI:  This is a follow-up appointment for depression and anxiety.  -Per chart review, she was found to have PVC, and was referred to cardiologist. She states that she feels about the same.  She has not noticed any difference since starting Geodon, and has not recognized any side effect from the medication.  She had a court in May.  The court did not allow her for summer visitation.  She has not been able to speak with her daughter since last December.  Although she had SI on the next day , she denies any acting on ot.  She denies any recent SI since then.  She believes that she will be okay as long as her ex-husband is not in her life.  She has an upcoming  trial in August 18 th.  She reports good relationship with her boyfriend.  She enjoyed going to fishing with him, and also meeting her oldest daughter.  She has lost 15 pounds without change in appetite.  She sleeps well.  She denies HI.  She denies alcohol use or drug use.  She used lorazepam a few times since her last visit.   Employment: Designer, industrial/product Support: boyfriend of ten years Household: boyfriend of 11 years in 2022 Marital status: divorced Number of children: 2 daughters, oldest is 49 year old, will move to [place]  Visit Diagnosis: No diagnosis found.  Past Psychiatric History: Please see initial evaluation for full details. I have reviewed the history. No updates at this time.     Past Medical History:  Past Medical History:  Diagnosis Date   Anxiety    Asthma    Bipolar 2 disorder (HCC)    Depression    GERD (gastroesophageal reflux disease)    Palpitations    PVC (premature ventricular contraction)     Past Surgical History:  Procedure Laterality Date   ABDOMINAL HYSTERECTOMY     APPENDECTOMY      Family Psychiatric History: Please see initial evaluation for full details. I have reviewed the history. No updates at this time.     Family History:  Family History  Problem Relation Age of Onset   Depression Mother    Multiple sclerosis Mother    Schizophrenia Maternal Grandmother  Heart disease Maternal Grandmother        aortic valve replacement    Social History:  Social History   Socioeconomic History   Marital status: Divorced    Spouse name: Not on file   Number of children: Not on file   Years of education: Not on file   Highest education level: Not on file  Occupational History   Not on file  Tobacco Use   Smoking status: Former    Pack years: 0.00    Types: Cigarettes    Quit date: 03/31/2014    Years since quitting: 6.8   Smokeless tobacco: Never  Vaping Use   Vaping Use: Never used  Substance and Sexual Activity   Alcohol use: No   Drug  use: No   Sexual activity: Yes    Partners: Male    Birth control/protection: Surgical  Other Topics Concern   Not on file  Social History Narrative   Not on file   Social Determinants of Health   Financial Resource Strain: Not on file  Food Insecurity: Not on file  Transportation Needs: Not on file  Physical Activity: Not on file  Stress: Not on file  Social Connections: Not on file    Allergies:  Allergies  Allergen Reactions   Erythromycin Hives   Tetracyclines & Related Hives    Metabolic Disorder Labs: No results found for: HGBA1C, MPG No results found for: PROLACTIN No results found for: CHOL, TRIG, HDL, CHOLHDL, VLDL, LDLCALC Lab Results  Component Value Date   TSH 1.53 12/30/2020   TSH 2.100 12/07/2019    Therapeutic Level Labs: No results found for: LITHIUM No results found for: VALPROATE No components found for:  CBMZ  Current Medications: Current Outpatient Medications  Medication Sig Dispense Refill   buPROPion (WELLBUTRIN XL) 150 MG 24 hr tablet Take 300 mg daily and 150 mg daily (total of 450 mg) 90 tablet 1   buPROPion (WELLBUTRIN XL) 300 MG 24 hr tablet Take 300 mg daily and 150 mg daily (total of 450 mg) 90 tablet 0   CINNAMON PO Take 1,000 mg by mouth daily.     FLUoxetine (PROZAC) 40 MG capsule Take 2 capsules (80 mg total) by mouth daily. 180 capsule 1   gabapentin (NEURONTIN) 300 MG capsule Take 1 capsule (300 mg total) by mouth 3 (three) times daily. 270 capsule 4   lansoprazole (PREVACID) 15 MG capsule Take 15 mg by mouth daily at 12 noon.     LORazepam (ATIVAN) 1 MG tablet Take 1 tablet (1 mg total) by mouth daily as needed for anxiety. 30 tablet 2   meloxicam (MOBIC) 15 MG tablet TAKE 1 TABLET BY MOUTH EVERY DAY 90 tablet 0   metoprolol succinate (TOPROL-XL) 50 MG 24 hr tablet Take with or immediately following a meal. 90 tablet 1   valACYclovir (VALTREX) 1000 MG tablet Take 1 tablet (1,000 mg total) by mouth 2 (two) times daily. 60 tablet  4   vitamin B-12 (CYANOCOBALAMIN) 1000 MCG tablet Take 5,000 mcg by mouth daily.      Vitamin D, Cholecalciferol, 1000 UNITS TABS Take 5,000 Units by mouth daily.      ziprasidone (GEODON) 20 MG capsule Take 1 capsule (20 mg total) by mouth 2 (two) times daily with a meal. 60 capsule 0   No current facility-administered medications for this visit.     Musculoskeletal: Strength & Muscle Tone:  N/A Gait & Station:  N/A Patient leans: N/A  Psychiatric Specialty Exam:  Review of Systems  Psychiatric/Behavioral:  Positive for dysphoric mood. Negative for agitation, behavioral problems, confusion, decreased concentration, hallucinations, self-injury, sleep disturbance and suicidal ideas. The patient is nervous/anxious. The patient is not hyperactive.   All other systems reviewed and are negative.  There were no vitals taken for this visit.There is no height or weight on file to calculate BMI.  General Appearance: NA  Eye Contact:  NA  Speech:  Clear and Coherent  Volume:  Normal  Mood:   anxious  Affect:  NA  Thought Process:  Coherent  Orientation:  Full (Time, Place, and Person)  Thought Content: Logical   Suicidal Thoughts:  No  Homicidal Thoughts:  No  Memory:  Immediate;   Good  Judgement:  Good  Insight:  Good  Psychomotor Activity:  Normal  Concentration:  Concentration: Good and Attention Span: Good  Recall:  Good  Fund of Knowledge: Good  Language: Good  Akathisia:  No  Handed:  Right  AIMS (if indicated): not done  Assets:  Communication Skills Desire for Improvement  ADL's:  Intact  Cognition: WNL  Sleep:  Good   Screenings: GAD-7    Flowsheet Row Office Visit from 07/21/2020 in [facility] Office Visit from 01/01/2020 in [facility] Office Visit from 06/20/2019 in [facility] Office Visit from 05/09/2018 in [facility]  Total GAD-7 Score 21 21 21 21       PHQ2-9    Flowsheet Row Video Visit from 11/07/2020 in  [facility] Office Visit from 07/21/2020 in [facility] Office Visit from 01/01/2020 in [facility] Office Visit from 06/20/2019 in [facility] Office Visit from 05/09/2018 in [facility]  PHQ-2 Total Score 2 1 6 6 4   PHQ-9 Total Score 4 2 23 15 11       Flowsheet Row Video Visit from 11/07/2020 in [facility]  C-SSRS RISK CATEGORY Error: Q3, 4, or 5 should not be populated when Q2 is No        Assessment and Plan:  Tracie James is a 50 y.o. year old female with a history of depression, who presents for follow up appointment for below.   1. MDD (major depressive disorder), recurrent episode, mild (HCC) 2. Anxiety Although she continues to report occasional depressive symptoms and an anxiety, she denies significant interference secondary to her symptoms.  Psychosocial stressors includes recent court regarding custody/unable to see her daughter.  Will discontinue Geodon given her recent episodes of PVC/limited benefit from this medication.  Will continue Loxitane and bupropion to target depression and anxiety.  Will continue temazepam as needed for anxiety.   This clinician has discussed the side effect associated with medication prescribed during this encounter. Please refer to notes in the previous encounters for more details.     Plan 1. Continue fluoxetine 80 mg daily 2. Continue Bupropion 450 mg daily 3. Discontinue Geodon 4 . Continue lorazepam 1 mg as needed for anxiety - she has refills, and takes this only occasionally 5. Next appointment: 10/5 at 8:40 for 20 mins, phone - She sees a therapist once a month  Past trials of medication: Cymbalta, Zoloft, Prozac, bupropion, Lithium (sick), Depakote (spending money), Latuda, Abilify (did not work), quetiapine (weight gain, somnolence). Geodon (PVC occured)  The patient demonstrates the following risk factors for suicide:  Chronic risk factors for suicide include: psychiatric disorder of depression and previous suicide attempts of overdosing medication. Acute risk factors for suicide include: family  or marital conflict. Protective factors for this patient include: responsibility to others (children, family), coping skills and hope for the future. Patient is future oriented and agrees to come for next appointment. Considering these factors, the overall suicide risk at this point appears to be low. She is future oriented, and is amenable to treatment. Patient is appropriate for outpatient follow up.       Tracie Hottereina Takahiro Godinho, MD 01/21/2021, 8:09 AM

## 2021-01-20 ENCOUNTER — Ambulatory Visit: Payer: 59 | Admitting: Nurse Practitioner

## 2021-01-21 ENCOUNTER — Other Ambulatory Visit: Payer: Self-pay

## 2021-01-21 ENCOUNTER — Telehealth (INDEPENDENT_AMBULATORY_CARE_PROVIDER_SITE_OTHER): Payer: 59 | Admitting: Psychiatry

## 2021-01-21 ENCOUNTER — Encounter: Payer: Self-pay | Admitting: Psychiatry

## 2021-01-21 DIAGNOSIS — F419 Anxiety disorder, unspecified: Secondary | ICD-10-CM | POA: Diagnosis not present

## 2021-01-21 DIAGNOSIS — F330 Major depressive disorder, recurrent, mild: Secondary | ICD-10-CM

## 2021-01-21 MED ORDER — BUPROPION HCL ER (XL) 300 MG PO TB24
ORAL_TABLET | ORAL | 1 refills | Status: DC
Start: 2021-02-20 — End: 2022-02-23

## 2021-01-30 ENCOUNTER — Other Ambulatory Visit: Payer: Self-pay

## 2021-01-30 ENCOUNTER — Encounter: Payer: Self-pay | Admitting: Cardiology

## 2021-01-30 ENCOUNTER — Ambulatory Visit (INDEPENDENT_AMBULATORY_CARE_PROVIDER_SITE_OTHER): Payer: 59 | Admitting: Cardiology

## 2021-01-30 VITALS — BP 112/66 | HR 74 | Ht 64.0 in | Wt 221.0 lb

## 2021-01-30 DIAGNOSIS — K2100 Gastro-esophageal reflux disease with esophagitis, without bleeding: Secondary | ICD-10-CM

## 2021-01-30 DIAGNOSIS — R002 Palpitations: Secondary | ICD-10-CM

## 2021-01-30 DIAGNOSIS — Z6837 Body mass index (BMI) 37.0-37.9, adult: Secondary | ICD-10-CM | POA: Diagnosis not present

## 2021-01-30 NOTE — Patient Instructions (Signed)

## 2021-01-30 NOTE — Progress Notes (Signed)
Cardiology Office Note:    Date:  01/30/2021   ID:  Tracie James, DOB 1971/01/07, MRN [id number]  PCP:  Marjie Skiff, NP   [facility] Providers Cardiologist:  Debbe Odea, MD     Referring MD: Gerre Scull, NP   Chief Complaint  Patient presents with   New Patient (Initial Visit)    Referred by PCP for Palpitations and leg swelling. Meds reviewed verbally with patient.    Tracie James is a 50 y.o. female who is being seen today for the evaluation of palpitations at the request of McElwee, Lauren A, NP.   History of Present Illness:    Tracie James is a 50 y.o. female with a hx of anxiety, PVCs, former smoker x20 years who presents due to palpitations.  Patient initially diagnosed with frequent PVCs in 2009 after having a Holter placed due to symptoms of palpitations.  She was in [place] at the time.  Echocardiogram was also obtained which she claims was normal.  Moved to [facility] in 2011 when symptoms of palpitations persisted.  Primary care provider started her on Toprol-XL which she took.  Somehow, Metroprolol fell off her drug please about 6 months ago.  She started having symptoms of palpitations, 6 weeks ago, Metroprolol was restarted with resolution of symptoms.  Currently denies chest pain, palpitations.  States feeling well.  Has no other concerns at this time.  Past Medical History:  Diagnosis Date   Anxiety    Asthma    Bipolar 2 disorder (HCC)    Depression    GERD (gastroesophageal reflux disease)    Palpitations    PVC (premature ventricular contraction)     Past Surgical History:  Procedure Laterality Date   ABDOMINAL HYSTERECTOMY     APPENDECTOMY      Current Medications: Current Meds  Medication Sig   buPROPion (WELLBUTRIN XL) 150 MG 24 hr tablet Take 300 mg daily and 150 mg daily (total of 450 mg)   [START ON 02/20/2021] buPROPion (WELLBUTRIN XL) 300 MG 24 hr tablet Take 300 mg daily and 150 mg daily (total of 450 mg)    CINNAMON PO Take 1,000 mg by mouth daily.   FLUoxetine (PROZAC) 40 MG capsule Take 2 capsules (80 mg total) by mouth daily.   gabapentin (NEURONTIN) 300 MG capsule Take 1 capsule (300 mg total) by mouth 3 (three) times daily.   lansoprazole (PREVACID) 15 MG capsule Take 15 mg by mouth daily at 12 noon.   LORazepam (ATIVAN) 1 MG tablet Take 1 tablet (1 mg total) by mouth daily as needed for anxiety.   meloxicam (MOBIC) 15 MG tablet TAKE 1 TABLET BY MOUTH EVERY DAY   metoprolol succinate (TOPROL-XL) 50 MG 24 hr tablet Take with or immediately following a meal.   vitamin B-12 (CYANOCOBALAMIN) 1000 MCG tablet Take 5,000 mcg by mouth daily.    Vitamin D, Cholecalciferol, 1000 UNITS TABS Take 5,000 Units by mouth daily.      Allergies:   Erythromycin and Tetracyclines & related   Social History   Socioeconomic History   Marital status: Divorced    Spouse name: Not on file   Number of children: Not on file   Years of education: Not on file   Highest education level: Not on file  Occupational History   Not on file  Tobacco Use   Smoking status: Former    Types: Cigarettes    Quit date: 03/31/2014    Years since quitting: 6.8  Smokeless tobacco: Never  Vaping Use   Vaping Use: Never used  Substance and Sexual Activity   Alcohol use: No   Drug use: No   Sexual activity: Yes    Partners: Male    Birth control/protection: Surgical  Other Topics Concern   Not on file  Social History Narrative   Not on file   Social Determinants of Health   Financial Resource Strain: Not on file  Food Insecurity: Not on file  Transportation Needs: Not on file  Physical Activity: Not on file  Stress: Not on file  Social Connections: Not on file     Family History: The patient's family history includes Depression in her mother; Heart disease in her maternal grandmother; Multiple sclerosis in her mother; Schizophrenia in her maternal grandmother.  ROS:   Please see the history of present  illness.     All other systems reviewed and are negative.  EKGs/Labs/Other Studies Reviewed:    The following studies were reviewed today:   EKG:  EKG is  ordered today.  The ekg ordered today demonstrates normal sinus rhythm  Recent Labs: 12/30/2020: ALT 29; BUN 12; Creatinine 1.1; Hemoglobin 11.8; Platelets 249; Potassium 3.9; Sodium 138; TSH 1.53  Recent Lipid Panel No results found for: CHOL, TRIG, HDL, CHOLHDL, VLDL, LDLCALC, LDLDIRECT   Risk Assessment/Calculations:          Physical Exam:    VS:  BP 112/66 (BP Location: Left Arm, Patient Position: Sitting, Cuff Size: Normal)   Pulse 74   Ht 5\' 4"  (1.626 m)   Wt 221 lb (100.2 kg)   LMP  (LMP Unknown)   SpO2 97%   BMI 37.93 kg/m     Wt Readings from Last 3 Encounters:  01/30/21 221 lb (100.2 kg)  01/01/21 215 lb 3.2 oz (97.6 kg)  09/26/20 216 lb 9.6 oz (98.2 kg)     GEN:  Well nourished, well developed in no acute distress HEENT: Normal NECK: No JVD; No carotid bruits LYMPHATICS: No lymphadenopathy CARDIAC: RRR, no murmurs, rubs, gallops RESPIRATORY:  Clear to auscultation without rales, wheezing or rhonchi  ABDOMEN: Soft, non-tender, non-distended MUSCULOSKELETAL:  No edema; No deformity  SKIN: Warm and dry NEUROLOGIC:  Alert and oriented x 3 PSYCHIATRIC:  Normal affect   ASSESSMENT:    1. Palpitations   2. Gastroesophageal reflux disease with esophagitis without hemorrhage   3. BMI 37.0-37.9, adult    PLAN:    In order of problems listed above:  Palpitations, history of PVCs.  Symptoms currently resolved on Toprol-XL 50 mg daily.  Continue Toprol-XL as prescribed.  Palpitations were likely from PVCs, anxiety, beta-blocker withdrawal.  No indication for additional testing at this time as patient is asymptomatic. History of GERD, continue PPI as prescribed. Obesity, low-calorie diet, weight loss recommended.  Follow-up yearly.     Medication Adjustments/Labs and Tests Ordered: Current medicines  are reviewed at length with the patient today.  Concerns regarding medicines are outlined above.  Orders Placed This Encounter  Procedures   EKG 12-Lead    No orders of the defined types were placed in this encounter.   Patient Instructions  Medication Instructions:  Your physician recommends that you continue on your current medications as directed. Please refer to the Current Medication list given to you today.  *If you need a refill on your cardiac medications before your next appointment, please call your pharmacy*   Lab Work: None ordered If you have labs (blood work) drawn today and your  tests are completely normal, you will receive your results only by: MyChart Message (if you have MyChart) OR A paper copy in the mail If you have any lab test that is abnormal or we need to change your treatment, we will call you to review the results.   Testing/Procedures: None ordered   Follow-Up: At [facility], you and your health needs are our priority.  As part of our continuing mission to provide you with exceptional heart care, we have created designated Provider Care Teams.  These Care Teams include your primary Cardiologist (physician) and Advanced Practice Providers (APPs -  Physician Assistants and Nurse Practitioners) who all work together to provide you with the care you need, when you need it.  We recommend signing up for the patient portal called "MyChart".  Sign up information is provided on this After Visit Summary.  MyChart is used to connect with patients for Virtual Visits (Telemedicine).  Patients are able to view lab/test results, encounter notes, upcoming appointments, etc.  Non-urgent messages can be sent to your provider as well.   To learn more about what you can do with MyChart, go to [email or web address].    Your next appointment:   1 year(s)  The format for your next appointment:   In Person  Provider:   Debbe Odea, MD   Other Instructions     Signed, Debbe Odea, MD  01/30/2021 4:21 PM    [facility]

## 2021-02-06 ENCOUNTER — Ambulatory Visit: Payer: 59 | Admitting: Cardiology

## 2021-02-09 MED ORDER — PREDNISONE 10 MG PO TABS: ORAL_TABLET | ORAL | 0 refills | Status: DC

## 2021-03-08 ENCOUNTER — Other Ambulatory Visit: Payer: Self-pay | Admitting: Nurse Practitioner

## 2021-03-08 NOTE — Telephone Encounter (Signed)
Requested medication (s) are due for refill today: yes  Requested medication (s) are on the active medication list: yes  Last refill:  02/09/21  Future visit scheduled: yes  Notes to clinic:  med not delegated to NT to RF   Requested Prescriptions  Pending Prescriptions Disp Refills   predniSONE (DELTASONE) 10 MG tablet [Pharmacy Med Name: PREDNISONE 10 MG TABLET] 42 tablet 0    Sig: Take 6 tablets by mouth daily for 2 days, then reduce by 1 tablet every 2 days until gone     Not Delegated - Endocrinology:  Oral Corticosteroids Failed - 03/08/2021  9:06 AM      Failed - This refill cannot be delegated      Passed - Last BP in normal range    BP Readings from Last 1 Encounters:  01/30/21 112/66          Passed - Valid encounter within last 6 months    Recent Outpatient Visits           2 months ago Palpitations   [facility] McElwee, Lauren A, NP   5 months ago Ulcerated tongue   [facility] Earl, Bemiss T, NP   6 months ago Major depressive disorder, recurrent episode, moderate (HCC)   [facility] Cannady, Jolene T, NP   7 months ago Acute right hip pain   [facility] Spade, Chestnut T, NP   1 year ago Major depressive disorder, recurrent episode, moderate (HCC)   [facility] Cannady, Dorie Rank, NP       Future Appointments             In 3 weeks Cannady, Dorie Rank, NP [employer]

## 2021-04-03 ENCOUNTER — Ambulatory Visit: Payer: 59 | Admitting: Nurse Practitioner

## 2021-04-11 ENCOUNTER — Other Ambulatory Visit: Payer: Self-pay | Admitting: Nurse Practitioner

## 2021-04-11 NOTE — Telephone Encounter (Signed)
No future appointment. Medication ordered on 02/09/21 as a one-time order. Cancelled 04/03/21 appointment. Refused.

## 2021-04-12 ENCOUNTER — Other Ambulatory Visit: Payer: Self-pay | Admitting: Nurse Practitioner

## 2021-04-19 NOTE — Progress Notes (Signed)
Virtual Visit via Telephone Note  I connected with Tracie James on 04/22/21 at  8:40 AM EDT by telephone and verified that I am speaking with the correct person using two identifiers.  Location: Patient: home Provider: office Persons participated in the visit- patient, provider    I discussed the limitations, risks, security and privacy concerns of performing an evaluation and management service by telephone and the availability of in person appointments. I also discussed with the patient that there may be a patient responsible charge related to this service. The patient expressed understanding and agreed to proceed.    I discussed the assessment and treatment plan with the patient. The patient was provided an opportunity to ask questions and all were answered. The patient agreed with the plan and demonstrated an understanding of the instructions.   The patient was advised to call back or seek an in-person evaluation if the symptoms worsen or if the condition fails to improve as anticipated.  I provided 15 minutes of non-face-to-face time during this encounter.   Tracie Hotter, MD    [facility] MD/PA/NP OP Progress Note  04/22/2021 9:04 AM [name]  MRN:  [id number]  Chief Complaint:  Chief Complaint   Follow-up; Depression    HPI:  This is a follow-up appointment for depression and anxiety.  She states that she lost the full custody.  She had to let her lawyer go as she could not afford it.  She feels upset about this.  She has been doing alright at work.  However, evening tends to be more difficult as everything reminds her of her daughter.  She reports good support from her other daughter and her boyfriend.  Her anxiety has been terrible, and she takes clonazepam every day.  She denies insomnia.  She denies change in weight or appetite.  She has difficulty in concentration when she is at home/when she is not busy.  Although she reports occasional passive SI, she denies any  plan or intent.  She drinks 32 oz of Moonshine on weekend. She denies craving for alcohol.   Employment: Designer, industrial/product Support: boyfriend of ten years Household: boyfriend of 11 years in 2022 Marital status: divorced Number of children: 2 daughters, oldest is 52 year old, will move to [place]  Visit Diagnosis:    ICD-10-CM   1. MDD (major depressive disorder), recurrent episode, mild (HCC)  F33.0 FLUoxetine (PROZAC) 40 MG capsule    2. Anxiety  F41.9 FLUoxetine (PROZAC) 40 MG capsule      Past Psychiatric History: Please see initial evaluation for full details. I have reviewed the history. No updates at this time.     Past Medical History:  Past Medical History:  Diagnosis Date   Anxiety    Asthma    Bipolar 2 disorder (HCC)    Depression    GERD (gastroesophageal reflux disease)    Palpitations    PVC (premature ventricular contraction)     Past Surgical History:  Procedure Laterality Date   ABDOMINAL HYSTERECTOMY     APPENDECTOMY      Family Psychiatric History: Please see initial evaluation for full details. I have reviewed the history. No updates at this time.     Family History:  Family History  Problem Relation Age of Onset   Depression Mother    Multiple sclerosis Mother    Schizophrenia Maternal Grandmother    Heart disease Maternal Grandmother        aortic valve replacement    Social History:  Social History   Socioeconomic History   Marital status: Divorced    Spouse name: Not on file   Number of children: Not on file   Years of education: Not on file   Highest education level: Not on file  Occupational History   Not on file  Tobacco Use   Smoking status: Former    Types: Cigarettes    Quit date: 03/31/2014    Years since quitting: 7.0   Smokeless tobacco: Never  Vaping Use   Vaping Use: Never used  Substance and Sexual Activity   Alcohol use: No   Drug use: No   Sexual activity: Yes    Partners: Male    Birth control/protection: Surgical   Other Topics Concern   Not on file  Social History Narrative   Not on file   Social Determinants of Health   Financial Resource Strain: Not on file  Food Insecurity: Not on file  Transportation Needs: Not on file  Physical Activity: Not on file  Stress: Not on file  Social Connections: Not on file    Allergies:  Allergies  Allergen Reactions   Erythromycin Hives   Tetracyclines & Related Hives    Metabolic Disorder Labs: No results found for: HGBA1C, MPG No results found for: PROLACTIN No results found for: CHOL, TRIG, HDL, CHOLHDL, VLDL, LDLCALC Lab Results  Component Value Date   TSH 1.53 12/30/2020   TSH 2.100 12/07/2019    Therapeutic Level Labs: No results found for: LITHIUM No results found for: VALPROATE No components found for:  CBMZ  Current Medications: Current Outpatient Medications  Medication Sig Dispense Refill   busPIRone (BUSPAR) 5 MG tablet Take 1 tablet (5 mg total) by mouth 2 (two) times daily. 60 tablet 1   buPROPion (WELLBUTRIN XL) 150 MG 24 hr tablet Take 300 mg daily and 150 mg daily (total of 450 mg) 90 tablet 1   buPROPion (WELLBUTRIN XL) 300 MG 24 hr tablet Take 300 mg daily and 150 mg daily (total of 450 mg) 90 tablet 1   CINNAMON PO Take 1,000 mg by mouth daily.     [START ON 05/07/2021] FLUoxetine (PROZAC) 40 MG capsule Take 2 capsules (80 mg total) by mouth daily. 180 capsule 1   gabapentin (NEURONTIN) 300 MG capsule Take 1 capsule (300 mg total) by mouth 3 (three) times daily. 270 capsule 4   lansoprazole (PREVACID) 15 MG capsule Take 15 mg by mouth daily at 12 noon.     LORazepam (ATIVAN) 1 MG tablet Take 1 tablet (1 mg total) by mouth daily as needed for anxiety. 30 tablet 2   meloxicam (MOBIC) 15 MG tablet TAKE 1 TABLET BY MOUTH EVERY DAY 90 tablet 0   metoprolol succinate (TOPROL-XL) 50 MG 24 hr tablet Take with or immediately following a meal. 90 tablet 1   predniSONE (DELTASONE) 10 MG tablet Take 6 tablets by mouth daily for 2  days, then reduce by 1 tablet every 2 days until gone 42 tablet 0   valACYclovir (VALTREX) 1000 MG tablet Take 1 tablet (1,000 mg total) by mouth 2 (two) times daily. (Patient not taking: Reported on 01/30/2021) 60 tablet 4   vitamin B-12 (CYANOCOBALAMIN) 1000 MCG tablet Take 5,000 mcg by mouth daily.      Vitamin D, Cholecalciferol, 1000 UNITS TABS Take 5,000 Units by mouth daily.      No current facility-administered medications for this visit.     Musculoskeletal: Strength & Muscle Tone:  N/A Gait & Station:  N/A Patient leans: N/A  Psychiatric Specialty Exam: Review of Systems  Psychiatric/Behavioral:  Positive for decreased concentration, dysphoric mood and suicidal ideas. Negative for agitation, behavioral problems, confusion, hallucinations, self-injury and sleep disturbance. The patient is nervous/anxious. The patient is not hyperactive.   All other systems reviewed and are negative.  There were no vitals taken for this visit.There is no height or weight on file to calculate BMI.  General Appearance: NA  Eye Contact:  NA  Speech:  Clear and Coherent  Volume:  Normal  Mood:   sad  Affect:  NA  Thought Process:  Coherent  Orientation:  Full (Time, Place, and Person)  Thought Content: Logical   Suicidal Thoughts:  Yes.  without intent/plan  Homicidal Thoughts:  No  Memory:  Immediate;   Good  Judgement:  Good  Insight:  Good  Psychomotor Activity:  Normal  Concentration:  Concentration: Good and Attention Span: Good  Recall:  Good  Fund of Knowledge: Good  Language: Good  Akathisia:  No  Handed:  Right  AIMS (if indicated): not done  Assets:  Communication Skills Desire for Improvement  ADL's:  Intact  Cognition: WNL  Sleep:  Good   Screenings: GAD-7    Flowsheet Row Office Visit from 07/21/2020 in [facility] Office Visit from 01/01/2020 in [facility] Office Visit from 06/20/2019 in [facility] Office Visit from 05/09/2018 in  [facility]  Total GAD-7 Score 21 21 21 21       PHQ2-9    Flowsheet Row Video Visit from 11/07/2020 in [facility] Office Visit from 07/21/2020 in [facility] Office Visit from 01/01/2020 in [facility] Office Visit from 06/20/2019 in [facility] Office Visit from 05/09/2018 in [facility]  PHQ-2 Total Score 2 1 6 6 4   PHQ-9 Total Score 4 2 23 15 11       Flowsheet Row Video Visit from 11/07/2020 in [facility]  C-SSRS RISK CATEGORY Error: Q3, 4, or 5 should not be populated when Q2 is No        Assessment and Plan:  Rossie Scarfone is a 50 y.o. year old female with a history of depression, who presents for follow up appointment for below.    1. MDD (major depressive disorder), recurrent episode, mild (HCC) 2. Anxiety She reports worsening in depressive symptoms and an anxiety since she lost the custody of her 54 yo daughter.  Will add BuSpar to target anxiety.  Discussed potential risk of serotonin syndrome.  Will continue fluoxetine to target depression and anxiety.  Will continue bupropion adjunctive treatment for depression.  Will continue lorazepam as needed for anxiety.   This clinician has discussed the side effect associated with medication prescribed during this encounter. Please refer to notes in the previous encounters for more details.     Plan 1. Continue fluoxetine 80 mg daily 2. Continue Bupropion 450 mg daily 3. Start Buspar 5 mg twice day  4 . Continue lorazepam 1 mg as needed for anxiety - she has refills 5. Next appointment: 11/16 at 9:30, phone  - She sees a therapist once a month   Past trials of medication: Cymbalta, Zoloft, Prozac, bupropion, Lithium (sick), Depakote (spending money), Latuda, Abilify (did not work), quetiapine (weight gain, somnolence). Geodon (PVC occurred)   I have reviewed suicide assessment in detail. No change in the  following assessment.    The patient demonstrates the following risk factors for suicide: Chronic  risk factors for suicide include: psychiatric disorder of depression and previous suicide attempts of overdosing medication. Acute risk factors for suicide include: family or marital conflict. Protective factors for this patient include: responsibility to others (children, family), coping skills and hope for the future. Patient is future oriented and agrees to come for next appointment. Considering these factors, the overall suicide risk at this point appears to be low. She is future oriented, and is amenable to treatment. Patient is appropriate for outpatient follow up.  Tracie Hotter, MD 04/22/2021, 9:04 AM

## 2021-04-22 ENCOUNTER — Other Ambulatory Visit: Payer: Self-pay

## 2021-04-22 ENCOUNTER — Telehealth (INDEPENDENT_AMBULATORY_CARE_PROVIDER_SITE_OTHER): Payer: 59 | Admitting: Psychiatry

## 2021-04-22 ENCOUNTER — Encounter: Payer: Self-pay | Admitting: Psychiatry

## 2021-04-22 DIAGNOSIS — F419 Anxiety disorder, unspecified: Secondary | ICD-10-CM

## 2021-04-22 DIAGNOSIS — F330 Major depressive disorder, recurrent, mild: Secondary | ICD-10-CM

## 2021-04-22 MED ORDER — FLUOXETINE HCL 40 MG PO CAPS: 80.0000 mg | ORAL_CAPSULE | Freq: Every day | ORAL | 1 refills | Status: DC

## 2021-04-22 MED ORDER — BUSPIRONE HCL 5 MG PO TABS: 5.0000 mg | ORAL_TABLET | Freq: Two times a day (BID) | ORAL | 1 refills | Status: DC

## 2021-04-22 NOTE — Patient Instructions (Signed)
1. Continue fluoxetine 80 mg daily 2. Continue Bupropion 450 mg daily 3. Start buspar 5 mg twice day  4 . Continue lorazepam 1 mg as needed for anxiety  5. Next appointment: 11/16 at 9:30

## 2021-05-19 ENCOUNTER — Other Ambulatory Visit: Payer: Self-pay | Admitting: *Deleted

## 2021-05-19 DIAGNOSIS — F330 Major depressive disorder, recurrent, mild: Secondary | ICD-10-CM

## 2021-06-02 NOTE — Progress Notes (Signed)
Virtual Visit via Telephone Note  I connected with Tracie James on 06/03/21 at  9:30 AM EST by telephone and verified that I am speaking with the correct person using two identifiers.  Location: Patient: home Provider: office Persons participated in the visit- patient, provider    I discussed the limitations, risks, security and privacy concerns of performing an evaluation and management service by telephone and the availability of in person appointments. I also discussed with the patient that there may be a patient responsible charge related to this service. The patient expressed understanding and agreed to proceed.    I discussed the assessment and treatment plan with the patient. The patient was provided an opportunity to ask questions and all were answered. The patient agreed with the plan and demonstrated an understanding of the instructions.   The patient was advised to call back or seek an in-person evaluation if the symptoms worsen or if the condition fails to improve as anticipated.  I provided 15 minutes of non-face-to-face time during this encounter.   [name], MD    [facility] MD/PA/NP OP Progress Note  06/03/2021 10:05 AM Tracie James  MRN:  [id number]  Chief Complaint:  Chief Complaint   Depression; Follow-up    HPI:  This is a follow-up appointment for depression.  She states that she has been doing better.  She tries to stay busy.  She takes more responsibility at work, and works in the work on weekend.  She agrees to have a good balance with work and the rest so that she will not be worn out.  She reports good relationship with her boyfriend, and enjoys a time with him when he is at home.  He is usually busy at work during the day.  She tends to feel down and depressed when she thinks about her daughter.  She tries not to think about it.  She believes her anxiety has been better since starting BuSpar.  She has fair sleep and appetite.  She has good  concentration.  She denies SI.  She denies panic attacks.  She feels comfortable to stay on the current medication.    Employment: Designer, industrial/product Support: boyfriend of ten years Household: boyfriend of 11 years in 2022 Marital status: divorced Number of children: 2 daughters, oldest is 58 year old, will move to [place]  Visit Diagnosis:    ICD-10-CM   1. Anxiety  F41.9     2. MDD (major depressive disorder), recurrent episode, mild (HCC)  F33.0 buPROPion (WELLBUTRIN XL) 150 MG 24 hr tablet      Past Psychiatric History: Please see initial evaluation for full details. I have reviewed the history. No updates at this time.     Past Medical History:  Past Medical History:  Diagnosis Date   Anxiety    Asthma    Bipolar 2 disorder (HCC)    Depression    GERD (gastroesophageal reflux disease)    Palpitations    PVC (premature ventricular contraction)     Past Surgical History:  Procedure Laterality Date   ABDOMINAL HYSTERECTOMY     APPENDECTOMY      Family Psychiatric History: Please see initial evaluation for full details. I have reviewed the history. No updates at this time.     Family History:  Family History  Problem Relation Age of Onset   Depression Mother    Multiple sclerosis Mother    Schizophrenia Maternal Grandmother    Heart disease Maternal Grandmother  aortic valve replacement    Social History:  Social History   Socioeconomic History   Marital status: Divorced    Spouse name: Not on file   Number of children: Not on file   Years of education: Not on file   Highest education level: Not on file  Occupational History   Not on file  Tobacco Use   Smoking status: Former    Types: Cigarettes    Quit date: 03/31/2014    Years since quitting: 7.1   Smokeless tobacco: Never  Vaping Use   Vaping Use: Never used  Substance and Sexual Activity   Alcohol use: No   Drug use: No   Sexual activity: Yes    Partners: Male    Birth control/protection: Surgical   Other Topics Concern   Not on file  Social History Narrative   Not on file   Social Determinants of Health   Financial Resource Strain: Not on file  Food Insecurity: Not on file  Transportation Needs: Not on file  Physical Activity: Not on file  Stress: Not on file  Social Connections: Not on file    Allergies:  Allergies  Allergen Reactions   Erythromycin Hives   Tetracyclines & Related Hives    Metabolic Disorder Labs: No results found for: HGBA1C, MPG No results found for: PROLACTIN No results found for: CHOL, TRIG, HDL, CHOLHDL, VLDL, LDLCALC Lab Results  Component Value Date   TSH 1.53 12/30/2020   TSH 2.100 12/07/2019    Therapeutic Level Labs: No results found for: LITHIUM No results found for: VALPROATE No components found for:  CBMZ  Current Medications: Current Outpatient Medications  Medication Sig Dispense Refill   buPROPion (WELLBUTRIN XL) 150 MG 24 hr tablet Take 1 tablet (150 mg total) by mouth daily. Take total of 450 mg daily. Along with 300 mg tab 90 tablet 1   buPROPion (WELLBUTRIN XL) 300 MG 24 hr tablet Take 300 mg daily and 150 mg daily (total of 450 mg) 90 tablet 1   [START ON 06/22/2021] busPIRone (BUSPAR) 5 MG tablet Take 1 tablet (5 mg total) by mouth 2 (two) times daily. 180 tablet 0   CINNAMON PO Take 1,000 mg by mouth daily.     FLUoxetine (PROZAC) 40 MG capsule Take 2 capsules (80 mg total) by mouth daily. 180 capsule 1   gabapentin (NEURONTIN) 300 MG capsule Take 1 capsule (300 mg total) by mouth 3 (three) times daily. 270 capsule 4   lansoprazole (PREVACID) 15 MG capsule Take 15 mg by mouth daily at 12 noon.     LORazepam (ATIVAN) 1 MG tablet Take 1 tablet (1 mg total) by mouth daily as needed for anxiety. 30 tablet 2   meloxicam (MOBIC) 15 MG tablet TAKE 1 TABLET BY MOUTH EVERY DAY 90 tablet 0   metoprolol succinate (TOPROL-XL) 50 MG 24 hr tablet Take with or immediately following a meal. 90 tablet 1   predniSONE (DELTASONE) 10 MG  tablet Take 6 tablets by mouth daily for 2 days, then reduce by 1 tablet every 2 days until gone 42 tablet 0   valACYclovir (VALTREX) 1000 MG tablet Take 1 tablet (1,000 mg total) by mouth 2 (two) times daily. (Patient not taking: Reported on 01/30/2021) 60 tablet 4   vitamin B-12 (CYANOCOBALAMIN) 1000 MCG tablet Take 5,000 mcg by mouth daily.      Vitamin D, Cholecalciferol, 1000 UNITS TABS Take 5,000 Units by mouth daily.      No current facility-administered medications  for this visit.     Musculoskeletal: Strength & Muscle Tone:  N/A Gait & Station:  N/A Patient leans: N/A  Psychiatric Specialty Exam: Review of Systems  Psychiatric/Behavioral:  Positive for dysphoric mood. Negative for agitation, behavioral problems, confusion, decreased concentration, hallucinations, self-injury, sleep disturbance and suicidal ideas. The patient is not nervous/anxious and is not hyperactive.   All other systems reviewed and are negative.  There were no vitals taken for this visit.There is no height or weight on file to calculate BMI.  General Appearance: NA  Eye Contact:  NA  Speech:  Clear and Coherent  Volume:  Normal  Mood:   better  Affect:  NA  Thought Process:  Coherent  Orientation:  Full (Time, Place, and Person)  Thought Content: Logical   Suicidal Thoughts:  No  Homicidal Thoughts:  No  Memory:  Immediate;   Good  Judgement:  Good  Insight:  Good  Psychomotor Activity:  Normal  Concentration:  Concentration: Good and Attention Span: Good  Recall:  Good  Fund of Knowledge: Good  Language: Good  Akathisia:  No  Handed:  Right  AIMS (if indicated): not done  Assets:  Communication Skills Desire for Improvement  ADL's:  Intact  Cognition: WNL  Sleep:  Good   Screenings: GAD-7    Flowsheet Row Office Visit from 07/21/2020 in [facility] Office Visit from 01/01/2020 in [facility] Office Visit from 06/20/2019 in [facility] Office Visit from  05/09/2018 in [facility]  Total GAD-7 Score 21 21 21 21       PHQ2-9    Flowsheet Row Video Visit from 11/07/2020 in [facility] Office Visit from 07/21/2020 in [facility] Office Visit from 01/01/2020 in [facility] Office Visit from 06/20/2019 in [facility] Office Visit from 05/09/2018 in [facility]  PHQ-2 Total Score 2 1 6 6 4   PHQ-9 Total Score 4 2 23 15 11       Flowsheet Row Video Visit from 11/07/2020 in [facility]  C-SSRS RISK CATEGORY Error: Q3, 4, or 5 should not be populated when Q2 is No        Assessment and Plan:  Muranda Coye is a 50 y.o. year old female with a history of depression, who presents for follow up appointment for below.    1. MDD (major depressive disorder), recurrent episode, mild (HCC) 2. Anxiety There has been overall improvement in her anxiety since starting BuSpar, although she continues to experience occasional depressive symptoms in the context of losing the custody of her 1 year old daughter.  Will continue fluoxetine to target depression and anxiety.  Will continue bupropion as adjunctive treatment for depression.  Will continue buspar for for anxiety.  Will continue lorazepam as needed for anxiety.  She will greatly benefit from supportive therapy/CBT; she is willing to see Mr. Silvio Clayman again.   This clinician has discussed the side effect associated with medication prescribed during this encounter. Please refer to notes in the previous encounters for more details.    Plan 1. Continue fluoxetine 80 mg daily 2. Continue Bupropion 450 mg daily 3. Continue Buspar 5 mg twice day  4 . Continue lorazepam 1 mg as needed for anxiety - she has refills 5. Next appointment: 2/13 at 9 AM for 30 mins, in person - She sees a therapist once a month   Past trials of medication: Cymbalta, Zoloft, Prozac, bupropion, Lithium (sick), Depakote  (spending money), Latuda, Abilify (  did not work), quetiapine (weight gain, somnolence). Geodon (PVC occurred)    The patient demonstrates the following risk factors for suicide: Chronic risk factors for suicide include: psychiatric disorder of depression and previous suicide attempts of overdosing medication. Acute risk factors for suicide include: family or marital conflict. Protective factors for this patient include: responsibility to others (children, family), coping skills and hope for the future. Patient is future oriented and agrees to come for next appointment. Considering these factors, the overall suicide risk at this point appears to be low. She is future oriented, and is amenable to treatment. Patient is appropriate for outpatient follow up.    [name], MD 06/03/2021, 10:05 AM

## 2021-06-03 ENCOUNTER — Other Ambulatory Visit: Payer: Self-pay

## 2021-06-03 ENCOUNTER — Encounter: Payer: Self-pay | Admitting: Psychiatry

## 2021-06-03 ENCOUNTER — Telehealth (INDEPENDENT_AMBULATORY_CARE_PROVIDER_SITE_OTHER): Payer: 59 | Admitting: Psychiatry

## 2021-06-03 DIAGNOSIS — F419 Anxiety disorder, unspecified: Secondary | ICD-10-CM | POA: Diagnosis not present

## 2021-06-03 DIAGNOSIS — F330 Major depressive disorder, recurrent, mild: Secondary | ICD-10-CM

## 2021-06-03 MED ORDER — BUSPIRONE HCL 5 MG PO TABS: 5.0000 mg | ORAL_TABLET | Freq: Two times a day (BID) | ORAL | 0 refills | Status: DC

## 2021-06-03 MED ORDER — BUPROPION HCL ER (XL) 150 MG PO TB24: 150.0000 mg | ORAL_TABLET | Freq: Every day | ORAL | 1 refills | Status: DC

## 2021-06-03 NOTE — Patient Instructions (Addendum)
1. Continue fluoxetine 80 mg daily 2. Continue Bupropion 450 mg daily 3. Continue Buspar 5 mg twice day  4 . Continue lorazepam 1 mg as needed for anxiety  5. Next appointment: 2/13 at 9 AM , in person  The next visit will be in person visit. Please arrive 15 mins before the scheduled time.   [facility]  Address: [address], [place], [place] [address]

## 2021-06-05 ENCOUNTER — Other Ambulatory Visit: Payer: Self-pay | Admitting: Nurse Practitioner

## 2021-06-05 NOTE — Telephone Encounter (Signed)
[facility] # [phone] called, unable to speak with pharmacy staff d/t call recording, will attempt later.

## 2021-06-05 NOTE — Telephone Encounter (Signed)
Requested Prescriptions  Pending Prescriptions Disp Refills  . gabapentin (NEURONTIN) 300 MG capsule [Pharmacy Med Name: GABAPENTIN 300 MG CAPSULE] 270 capsule 0    Sig: TAKE 1 CAPSULE BY MOUTH THREE TIMES A DAY     Neurology: Anticonvulsants - gabapentin Passed - 06/05/2021 11:28 AM      Passed - Valid encounter within last 12 months    Recent Outpatient Visits          5 months ago Palpitations   [facility] McElwee, Lauren A, NP   8 months ago Ulcerated tongue   [facility] [place], Jolene T, NP   9 months ago Major depressive disorder, recurrent episode, moderate (HCC)   [facility] Cannady, Jolene T, NP   10 months ago Acute right hip pain   [facility] [place], Flora T, NP   1 year ago Major depressive disorder, recurrent episode, moderate (HCC)   [facility] [place], Dorie Rank, NP

## 2021-06-24 ENCOUNTER — Other Ambulatory Visit: Payer: Self-pay | Admitting: Nurse Practitioner

## 2021-06-24 NOTE — Telephone Encounter (Signed)
Rx refilled 06/05/2021 #270 with 0 refills. Pt needs an appointment for further refills.

## 2021-06-25 NOTE — Telephone Encounter (Signed)
[facility] called [facility] [phone] regarding the refill request for the gabapentin 300 mg.  It's being requested too early.   Per pharmacist the system initiated the request since it was her last refill for this rx.  I have refused this per our protocol due to it being requested too early.

## 2021-07-02 ENCOUNTER — Other Ambulatory Visit: Payer: Self-pay | Admitting: Nurse Practitioner

## 2021-07-02 NOTE — Telephone Encounter (Signed)
Requested Prescriptions  Pending Prescriptions Disp Refills   meloxicam (MOBIC) 15 MG tablet [Pharmacy Med Name: MELOXICAM 15 MG TABLET] 90 tablet 0    Sig: TAKE 1 TABLET BY MOUTH EVERY DAY     Analgesics:  COX2 Inhibitors Failed - 07/02/2021  1:39 AM      Failed - HGB in normal range and within 360 days    Hemoglobin  Date Value Ref Range Status  12/30/2020 11.8 (A) 12.0 - 16.0 Final  12/07/2019 13.6 11.1 - 15.9 g/dL Final         Passed - Cr in normal range and within 360 days    Creatinine  Date Value Ref Range Status  12/30/2020 1.1 0.5 - 1.1 Final   Creatinine, Ser  Date Value Ref Range Status  12/07/2019 1.21 (H) 0.57 - 1.00 mg/dL Final         Passed - Patient is not pregnant      Passed - Valid encounter within last 12 months    Recent Outpatient Visits          6 months ago Palpitations   [facility] McElwee, Lauren A, NP   9 months ago Ulcerated tongue   [facility] [place], Jolene T, NP   9 months ago Major depressive disorder, recurrent episode, moderate (HCC)   [facility] Cannady, Jolene T, NP   11 months ago Acute right hip pain   [facility] [place], [place] T, NP   1 year ago Major depressive disorder, recurrent episode, moderate (HCC)   [facility] [place], Dorie Rank, NP

## 2021-07-09 ENCOUNTER — Other Ambulatory Visit: Payer: Self-pay

## 2021-07-09 ENCOUNTER — Emergency Department (HOSPITAL_BASED_OUTPATIENT_CLINIC_OR_DEPARTMENT_OTHER)
Admission: EM | Admit: 2021-07-09 | Discharge: 2021-07-09 | Disposition: A | Discharge status: Home / Self Care | Payer: 59 | Attending: Emergency Medicine | Admitting: Emergency Medicine

## 2021-07-09 DIAGNOSIS — J45909 Unspecified asthma, uncomplicated: Secondary | ICD-10-CM | POA: Diagnosis not present

## 2021-07-09 DIAGNOSIS — S61215A Laceration without foreign body of left ring finger without damage to nail, initial encounter: Secondary | ICD-10-CM | POA: Diagnosis not present

## 2021-07-09 DIAGNOSIS — Z87891 Personal history of nicotine dependence: Secondary | ICD-10-CM | POA: Diagnosis not present

## 2021-07-09 DIAGNOSIS — W268XXA Contact with other sharp object(s), not elsewhere classified, initial encounter: Secondary | ICD-10-CM | POA: Insufficient documentation

## 2021-07-09 DIAGNOSIS — Z23 Encounter for immunization: Secondary | ICD-10-CM | POA: Diagnosis not present

## 2021-07-09 DIAGNOSIS — Y990 Civilian activity done for income or pay: Secondary | ICD-10-CM | POA: Diagnosis not present

## 2021-07-09 DIAGNOSIS — S6992XA Unspecified injury of left wrist, hand and finger(s), initial encounter: Secondary | ICD-10-CM | POA: Diagnosis present

## 2021-07-09 MED ORDER — LIDOCAINE HCL (PF) 1 % IJ SOLN
10.0000 mL | Freq: Once | INTRAMUSCULAR | Status: AC
  Administered 2021-07-09: 07:00:00 10 mL
  Filled 2021-07-09: qty 10

## 2021-07-09 MED ORDER — TETANUS-DIPHTH-ACELL PERTUSSIS 5-2.5-18.5 LF-MCG/0.5 IM SUSY
0.5000 mL | PREFILLED_SYRINGE | Freq: Once | INTRAMUSCULAR | Status: AC
  Administered 2021-07-09: 07:00:00 0.5 mL via INTRAMUSCULAR
  Filled 2021-07-09: qty 0.5

## 2021-07-09 NOTE — Discharge Instructions (Signed)
The dermabond glue will dissolve over the next 5-7 days on its own.  Do not apply peroxide or ointment over the glue.  The wound should heal quickly.  If you notice redness spreading down your finger, or your finger swelling painfully, please contact your doctor's office or return to the ER.

## 2021-07-09 NOTE — ED Provider Notes (Signed)
[facility] EMERGENCY DEPARTMENT Provider Note   CSN: [id number] Arrival date & time: 07/09/21  5188     History Chief Complaint  Patient presents with   Laceration    Tracie James is a 50 y.o. female here with laceration to left 4th finger at work, accidentally cut finger on knife in purse. Not sure of last tetanus shot.  Injury occurred around 0530 this morning, small laceration but persistently bleeding.  HPI     Past Medical History:  Diagnosis Date   Anxiety    Asthma    Bipolar 2 disorder (HCC)    Depression    GERD (gastroesophageal reflux disease)    Palpitations    PVC (premature ventricular contraction)     Patient Active Problem List   Diagnosis Date Noted   Ulcerated tongue 09/26/2020   Medication monitoring encounter 09/08/2020   Acute right hip pain 07/21/2020   Generalized body aches 12/07/2019   Fatigue 12/07/2019   Chronic pain syndrome 05/09/2018   Major depressive disorder, recurrent episode, moderate (HCC) 05/28/2016   Tendonitis of left rotator cuff 01/21/2016   Obesity 05/27/2015   Anxiety 05/26/2015   Palpitations 05/26/2015   Insomnia 05/26/2015   Vitamin D deficiency 05/26/2015    Past Surgical History:  Procedure Laterality Date   ABDOMINAL HYSTERECTOMY     APPENDECTOMY       OB History   No obstetric history on file.     Family History  Problem Relation Age of Onset   Depression Mother    Multiple sclerosis Mother    Schizophrenia Maternal Grandmother    Heart disease Maternal Grandmother        aortic valve replacement    Social History   Tobacco Use   Smoking status: Former    Types: Cigarettes    Quit date: 03/31/2014    Years since quitting: 7.2   Smokeless tobacco: Never  Vaping Use   Vaping Use: Never used  Substance Use Topics   Alcohol use: No   Drug use: No    Home Medications Prior to Admission medications   Medication Sig Start Date End Date Taking? Authorizing Provider  buPROPion  (WELLBUTRIN XL) 150 MG 24 hr tablet Take 1 tablet (150 mg total) by mouth daily. Take total of 450 mg daily. Along with 300 mg tab 06/03/21   [name], MD  buPROPion (WELLBUTRIN XL) 300 MG 24 hr tablet Take 300 mg daily and 150 mg daily (total of 450 mg) 02/20/21   [name], MD  busPIRone (BUSPAR) 5 MG tablet Take 1 tablet (5 mg total) by mouth 2 (two) times daily. 06/22/21 09/20/21  [name], MD  CINNAMON PO Take 1,000 mg by mouth daily.    [provider]  FLUoxetine (PROZAC) 40 MG capsule Take 2 capsules (80 mg total) by mouth daily. 05/07/21 11/03/21  [name], MD  gabapentin (NEURONTIN) 300 MG capsule TAKE 1 CAPSULE BY MOUTH THREE TIMES A DAY 06/05/21   Cannady, Corrie Dandy T, NP  lansoprazole (PREVACID) 15 MG capsule Take 15 mg by mouth daily at 12 noon.    [provider]  LORazepam (ATIVAN) 1 MG tablet Take 1 tablet (1 mg total) by mouth daily as needed for anxiety. 11/07/20   [name], MD  meloxicam (MOBIC) 15 MG tablet TAKE 1 TABLET BY MOUTH EVERY DAY 07/02/21   Aura Dials T, NP  metoprolol succinate (TOPROL-XL) 50 MG 24 hr tablet Take with or immediately following a meal. 01/01/21  McElwee, Lauren A, NP  predniSONE (DELTASONE) 10 MG tablet Take 6 tablets by mouth daily for 2 days, then reduce by 1 tablet every 2 days until gone 02/09/21   Cannady, Jolene T, NP  valACYclovir (VALTREX) 1000 MG tablet Take 1 tablet (1,000 mg total) by mouth 2 (two) times daily. Patient not taking: Reported on 01/30/2021 09/26/20   Aura Dials T, NP  vitamin B-12 (CYANOCOBALAMIN) 1000 MCG tablet Take 5,000 mcg by mouth daily.     [provider]  Vitamin D, Cholecalciferol, 1000 UNITS TABS Take 5,000 Units by mouth daily.     [provider]    Allergies    Erythromycin and Tetracyclines & related  Review of Systems   Review of Systems  Constitutional:  Negative for chills and fever.  Respiratory:  Negative for cough and shortness of breath.    Cardiovascular:  Negative for chest pain and palpitations.  Musculoskeletal:  Positive for arthralgias and myalgias.  Skin:  Positive for wound. Negative for rash.  Neurological:  Negative for weakness and numbness.  All other systems reviewed and are negative.  Physical Exam Updated Vital Signs BP 131/75 (BP Location: Right Arm)    Pulse 74    Temp 98.2 F (36.8 C) (Oral)    Resp 18    Ht 5\' 4"  (1.626 m)    Wt 97.5 kg    LMP  (LMP Unknown)    SpO2 98%    BMI 36.90 kg/m   Physical Exam Constitutional:      General: She is not in acute distress. HENT:     Head: Normocephalic and atraumatic.  Cardiovascular:     Rate and Rhythm: Normal rate and regular rhythm.  Pulmonary:     Effort: Pulmonary effort is normal. No respiratory distress.  Skin:    General: Skin is warm and dry.     Comments: 0.5 cm linear laceration overlying left 4th distal finger pad, persistent droplet bleeding  Neurological:     General: No focal deficit present.     Mental Status: She is alert. Mental status is at baseline.    ED Results / Procedures / Treatments   Labs (all labs ordered are listed, but only abnormal results are displayed) Labs Reviewed - No data to display  EKG None  Radiology No results found.  Procedures . Laceration Repair  Date/Time: 07/09/2021 7:50 AM Performed by: 07/11/2021, MD Authorized by: Terald Sleeper, MD   Consent:    Consent obtained:  Verbal   Consent given by:  Patient   Risks discussed:  Pain and infection   Alternatives discussed:  Observation Universal protocol:    Procedure explained and questions answered to patient or proxy's satisfaction: yes     Immediately prior to procedure, a time out was called: yes     Patient identity confirmed:  Arm band Anesthesia:    Anesthesia method:  None Laceration details:    Location:  Finger   Finger location:  L ring finger   Length (cm):  0.5   Depth (mm):  1 Pre-procedure details:    Preparation:   Patient was prepped and draped in usual sterile fashion Exploration:    Limited defect created (wound extended): no     Imaging outcome: foreign body not noted     Wound extent: no areolar tissue violation noted, no fascia violation noted and no foreign bodies/material noted     Contaminated: no   Treatment:    Amount of cleaning:  Standard Skin repair:    Repair method:  Tissue adhesive Approximation:    Approximation:  Close Repair type:    Repair type:  Simple Post-procedure details:    Dressing:  Open (no dressing)   Procedure completion:  Tolerated well, no immediate complications   Medications Ordered in ED Medications  Tdap (BOOSTRIX) injection 0.5 mL (0.5 mLs Intramuscular Given 07/09/21 0720)  lidocaine (PF) (XYLOCAINE) 1 % injection 10 mL (10 mLs Infiltration Given by Other 07/09/21 7782)    ED Course  I have reviewed the triage vital signs and the nursing notes.  Pertinent labs & imaging results that were available during my care of the patient were reviewed by me and considered in my medical decision making (see chart for details).  Laceration to finger Tdap updated here No evidence of infection or tendon injury No nailbed involvement  Wound was cleaned, pressure and icebath used to stem bleeding. Pt observed for 1.5 hours in ED total, no further bleeding Dermabond applied - okay for discahrge  Clinical Course as of 07/09/21 0816  Thu Jul 09, 2021  0745 Bleeding has stopped with direct pressure on wound and icebath submersion. [MT]  0816 Bleeding remains stopped, dermabond applied, okay for discharge [MT]    Clinical Course User Index [MT] Enriqueta Augusta, Kermit Balo, MD    Final Clinical Impression(s) / ED Diagnoses Final diagnoses:  Laceration of left ring finger without foreign body without damage to nail, initial encounter    Rx / DC Orders ED Discharge Orders     None        Terald Sleeper, MD 07/09/21 [phone]

## 2021-07-09 NOTE — ED Triage Notes (Signed)
Patient coming to ED for evaluation of laceration to L "ring" finger.  Reports "I carry a knife in my purse for protection.  When I got to work I reached in and I didn't know it could come open.  It cut my finger and I can't get it to stop bleeding."  Reports sensation intact.

## 2021-07-12 ENCOUNTER — Other Ambulatory Visit: Payer: Self-pay | Admitting: Nurse Practitioner

## 2021-07-14 NOTE — Telephone Encounter (Signed)
Patient due for follow up visit

## 2021-07-14 NOTE — Telephone Encounter (Signed)
Requested Prescriptions  Pending Prescriptions Disp Refills   metoprolol succinate (TOPROL-XL) 50 MG 24 hr tablet [Pharmacy Med Name: METOPROLOL SUCC ER 50 MG TAB] 90 tablet 0    Sig: TAKE WITH OR IMMEDIATELY FOLLOWING A MEAL.     Cardiovascular:  Beta Blockers Failed - 07/12/2021  9:16 AM      Failed - Valid encounter within last 6 months    Recent Outpatient Visits          6 months ago Palpitations   [facility] McElwee, Lauren A, NP   9 months ago Ulcerated tongue   [facility] [place], Jolene T, NP   10 months ago Major depressive disorder, recurrent episode, moderate (HCC)   [facility] [place], Jolene T, NP   11 months ago Acute right hip pain   [facility] [place], [place] T, NP   1 year ago Major depressive disorder, recurrent episode, moderate (HCC)   [facility] [place], [place] T, NP      Future Appointments            In 3 days Johnson, Megan P, DO [facility]           Passed - Last BP in normal range    BP Readings from Last 1 Encounters:  07/09/21 131/75         Passed - Last Heart Rate in normal range    Pulse Readings from Last 1 Encounters:  07/09/21 74

## 2021-07-14 NOTE — Telephone Encounter (Signed)
Pt called, scheduled appt for 07/17/21 at 1440 with Dr. Laural Benes for HTN f/up and meds

## 2021-07-17 ENCOUNTER — Encounter: Payer: Self-pay | Admitting: Family Medicine

## 2021-07-17 ENCOUNTER — Other Ambulatory Visit: Payer: Self-pay

## 2021-07-17 ENCOUNTER — Ambulatory Visit (INDEPENDENT_AMBULATORY_CARE_PROVIDER_SITE_OTHER): Payer: 59 | Admitting: Family Medicine

## 2021-07-17 VITALS — BP 118/75 | HR 80 | Temp 98.1°F | Wt 222.4 lb

## 2021-07-17 DIAGNOSIS — R002 Palpitations: Secondary | ICD-10-CM

## 2021-07-17 DIAGNOSIS — M25551 Pain in right hip: Secondary | ICD-10-CM

## 2021-07-17 DIAGNOSIS — M25552 Pain in left hip: Secondary | ICD-10-CM

## 2021-07-17 MED ORDER — GABAPENTIN 300 MG PO CAPS: ORAL_CAPSULE | ORAL | 0 refills | Status: DC

## 2021-07-17 MED ORDER — NAPROXEN 500 MG PO TABS: 500.0000 mg | ORAL_TABLET | Freq: Two times a day (BID) | ORAL | 0 refills | Status: DC

## 2021-07-17 NOTE — Progress Notes (Signed)
BP 118/75    Pulse 80    Temp 98.1 F (36.7 C)    Wt 222 lb 6.4 oz (100.9 kg)    LMP  (LMP Unknown)    SpO2 98%    BMI 38.17 kg/m    Subjective:    Patient ID: Tracie James, female    DOB: 03-Feb-1971, 50 y.o.   MRN: [id number]  HPI: Tracie James is a 50 y.o. female  Chief Complaint  Patient presents with   Hip Pain    Patient states her meloxicam is not working for her anymore    Hand Pain    Patient states she takes meloxicam for her finger pain and does not seem to be working    Hypertension   PALPITATIONS Duration: chronic Symptom description: heart racing Duration of episode: none with medicine Frequency: recurrentl Activity when event occurred:  at random Related to exertion: no Dyspnea: no Chest pain: no Syncope: no Anxiety/stress: no Nausea/vomiting: no Diaphoresis: no Coronary artery disease: no Congestive heart failure: no Arrhythmia:no Thyroid disease: no Status:  better Treatments attempted:metoprolol  HIP PAIN Duration: 2-3 months Involved hip: bilateral  Mechanism of injury: unknown Location: diffuse and very low back Onset: gradual  Severity: moderate  Quality: aching and sore Frequency: intermittent Radiation: no Aggravating factors: laying down to go to sleep   Alleviating factors: moving  Status: worse Treatments attempted: meloxicam   Relief with NSAIDs?: moderate Weakness with weight bearing: no Weakness with walking: no Paresthesias / decreased sensation: no Swelling: no Redness:no Fevers: no   Relevant past medical, surgical, family and social history reviewed and updated as indicated. Interim medical history since our last visit reviewed. Allergies and medications reviewed and updated.  Review of Systems  Constitutional: Negative.   Respiratory: Negative.    Cardiovascular: Negative.   Musculoskeletal: Negative.   Psychiatric/Behavioral: Negative.     Per HPI unless specifically indicated above     Objective:     BP 118/75    Pulse 80    Temp 98.1 F (36.7 C)    Wt 222 lb 6.4 oz (100.9 kg)    LMP  (LMP Unknown)    SpO2 98%    BMI 38.17 kg/m   Wt Readings from Last 3 Encounters:  07/17/21 222 lb 6.4 oz (100.9 kg)  07/09/21 215 lb (97.5 kg)  01/30/21 221 lb (100.2 kg)    Physical Exam Vitals and nursing note reviewed.  Constitutional:      General: She is not in acute distress.    Appearance: Normal appearance. She is not ill-appearing, toxic-appearing or diaphoretic.  HENT:     Head: Normocephalic and atraumatic.     Right Ear: External ear normal.     Left Ear: External ear normal.     Nose: Nose normal.     Mouth/Throat:     Mouth: Mucous membranes are moist.     Pharynx: Oropharynx is clear.  Eyes:     General: No scleral icterus.       Right eye: No discharge.        Left eye: No discharge.     Extraocular Movements: Extraocular movements intact.     Conjunctiva/sclera: Conjunctivae normal.     Pupils: Pupils are equal, round, and reactive to light.  Cardiovascular:     Rate and Rhythm: Normal rate and regular rhythm.     Pulses: Normal pulses.     Heart sounds: Normal heart sounds. No murmur heard.   No friction rub. No  gallop.  Pulmonary:     Effort: Pulmonary effort is normal. No respiratory distress.     Breath sounds: Normal breath sounds. No stridor. No wheezing, rhonchi or rales.  Chest:     Chest wall: No tenderness.  Musculoskeletal:        General: Normal range of motion.     Cervical back: Normal range of motion and neck supple.  Skin:    General: Skin is warm and dry.     Capillary Refill: Capillary refill takes less than 2 seconds.     Coloration: Skin is not jaundiced or pale.     Findings: No bruising, erythema, lesion or rash.  Neurological:     General: No focal deficit present.     Mental Status: She is alert and oriented to person, place, and time. Mental status is at baseline.  Psychiatric:        Mood and Affect: Mood normal.        Behavior:  Behavior normal.        Thought Content: Thought content normal.        Judgment: Judgment normal.    Results for orders placed or performed in visit on 01/07/21  CBC and differential  Result Value Ref Range   Hemoglobin 11.8 (A) 12.0 - 16.0   HCT 37 36 - 46   Platelets 249 150 - 399   WBC 5.7   CBC  Result Value Ref Range   RBC 4.32 3.87 - 5.11  Basic metabolic panel  Result Value Ref Range   Glucose 84    BUN 12 4 - 21   CO2 23 (A) 13 - 22   Creatinine 1.1 0.5 - 1.1   Potassium 3.9 3.4 - 5.3   Sodium 138 137 - 147   Chloride 104 99 - 108  Comprehensive metabolic panel  Result Value Ref Range   Globulin 2.4    GFR calc Af Amer 71    GFR calc non Af Amer 61    Calcium 8.9 8.7 - 10.7   Albumin 4.0 3.5 - 5.0  Hepatic function panel  Result Value Ref Range   Alkaline Phosphatase 81 25 - 125   ALT 29 7 - 35   AST 24 13 - 35   Bilirubin, Total 0.3   TSH  Result Value Ref Range   TSH 1.53 0.41 - 5.90      Assessment & Plan:   Problem List Items Addressed This Visit       Other   Palpitations - Primary    Stable. Continue metoprolol. Had bloodwork in November through work- will send a copy. Call with any concerns. Continue to monitor.       Other Visit Diagnoses     Bilateral hip pain       Will do holiday from meloxicam and change to naproxen. Start stretches. Declined x-rays. Continue to mointor. Call with any concerns.         Follow up plan: Return in about 3 months (around 10/15/2021) for physical with Jolene.

## 2021-07-17 NOTE — Assessment & Plan Note (Signed)
Stable. Continue metoprolol. Had bloodwork in November through work- will send a copy. Call with any concerns. Continue to monitor.

## 2021-08-11 ENCOUNTER — Telehealth (INDEPENDENT_AMBULATORY_CARE_PROVIDER_SITE_OTHER): Payer: 59 | Admitting: Nurse Practitioner

## 2021-08-11 ENCOUNTER — Encounter: Payer: Self-pay | Admitting: Nurse Practitioner

## 2021-08-11 DIAGNOSIS — G894 Chronic pain syndrome: Secondary | ICD-10-CM

## 2021-08-11 DIAGNOSIS — M797 Fibromyalgia: Secondary | ICD-10-CM | POA: Diagnosis not present

## 2021-08-11 MED ORDER — CYCLOBENZAPRINE HCL 10 MG PO TABS: 10.0000 mg | ORAL_TABLET | Freq: Every evening | ORAL | 1 refills | Status: DC | PRN

## 2021-08-11 MED ORDER — GABAPENTIN 600 MG PO TABS: ORAL_TABLET | ORAL | 4 refills | Status: DC

## 2021-08-11 NOTE — Progress Notes (Signed)
LMP  (LMP Unknown)    Subjective:    Patient ID: Tracie James, female    DOB: [birth year], 51 y.o.   MRN: [id number]  HPI: Tracie James is a 51 y.o. female  Chief Complaint  Patient presents with   Neck Pain   Hip Pain   Hand Pain    Patient states at her last office visit. Patient states the provider stopped her Meloxicam prescription and changed it to another prescription (Naproxen). Patient states the current medication (Naproxen) isn't helping with the pain and discomfort. Patient would like to discuss different treatment options with provider. Patient states the Meloxicam had stopped working as well. Patient states the worse pain is in her very lower part of her back almost to her buttocks.    Back Pain   This visit was completed via video visit through MyChart due to the restrictions of the COVID-19 pandemic. All issues as above were discussed and addressed. Physical exam was done as above through visual confirmation on video through MyChart. If it was felt that the patient should be evaluated in the office, they were directed there. The patient verbally consented to this visit. Location of the patient: home Location of the provider: work Those involved with this call:  Provider: Aura Dials, DNP CMA: Malen Gauze, CMA Front Desk/Registration: [employer]  Time spent on call:  21 minutes with patient face to face via video conference. More than 50% of this time was spent in counseling and coordination of care. 15 minutes total spent in review of patient's record and preparation of their chart.  I verified patient identity using two factors (patient name and date of birth). Patient consents verbally to being seen via telemedicine visit today.    CHRONIC PAIN At recent visit on 07/17/21 Meloxicam was stopped and was started on Naproxen, which is offering no benefit.  Continues on Gabapentin 300 MG TID which helps with sensitivity part of pain, people can touch her without  having discomfort.  Mornings are the toughest for her and then into the evening -- afternoons not as bad,    She is followed by psychiatry for depression and chronic fatigue, last seen 06/03/21.  Did physical therapy in the past, last for shoulders and neck 2-3 years ago, this offered no benefit. Pain status: uncontrolled Satisfied with current treatment?: no Medication side effects: no Medication compliance: good compliance Duration: 4-5 years Location: Neck, hip, hand (L>R),  and lower back (not down legs) == back and hands are the worst area at this time Quality: sharp, stabbing, and throbbing Current pain level: 5/10 Previous pain level: 8/10 Aggravating factors: prolonged sitting Alleviating factors: heat and NSAIDs Previous pain specialty evaluation: no Non-narcotic analgesic meds: no Narcotic contract:no Treatments attempted: heat and ibuprofen    Relevant past medical, surgical, family and social history reviewed and updated as indicated. Interim medical history since our last visit reviewed. Allergies and medications reviewed and updated.  Review of Systems  Constitutional:  Positive for fatigue. Negative for activity change, appetite change, diaphoresis and fever.  Respiratory:  Negative for cough, chest tightness and shortness of breath.   Cardiovascular:  Negative for chest pain, palpitations and leg swelling.  Gastrointestinal: Negative.   Musculoskeletal:  Positive for arthralgias.  Neurological: Negative.   Psychiatric/Behavioral:  Positive for decreased concentration. Negative for self-injury and sleep disturbance. The patient is nervous/anxious.    Per HPI unless specifically indicated above     Objective:    LMP  (LMP Unknown)  Wt Readings from Last 3 Encounters:  07/17/21 222 lb 6.4 oz (100.9 kg)  07/09/21 215 lb (97.5 kg)  01/30/21 221 lb (100.2 kg)    Physical Exam Vitals and nursing note reviewed.  Constitutional:      General: She is awake. She  is not in acute distress.    Appearance: She is well-developed. She is not ill-appearing.  HENT:     Head: Normocephalic.     Right Ear: Hearing normal.     Left Ear: Hearing normal.  Eyes:     General: Lids are normal.        Right eye: No discharge.        Left eye: No discharge.     Conjunctiva/sclera: Conjunctivae normal.  Pulmonary:     Effort: Pulmonary effort is normal. No accessory muscle usage or respiratory distress.  Musculoskeletal:     Cervical back: Normal range of motion.  Neurological:     Mental Status: She is alert and oriented to person, place, and time.  Psychiatric:        Attention and Perception: Attention normal.        Mood and Affect: Mood normal.        Behavior: Behavior normal. Behavior is cooperative.        Thought Content: Thought content normal.        Judgment: Judgment normal.   Results for orders placed or performed in visit on 01/07/21  CBC and differential  Result Value Ref Range   Hemoglobin 11.8 (A) 12.0 - 16.0   HCT 37 36 - 46   Platelets 249 150 - 399   WBC 5.7   CBC  Result Value Ref Range   RBC 4.32 3.87 - 5.11  Basic metabolic panel  Result Value Ref Range   Glucose 84    BUN 12 4 - 21   CO2 23 (A) 13 - 22   Creatinine 1.1 0.5 - 1.1   Potassium 3.9 3.4 - 5.3   Sodium 138 137 - 147   Chloride 104 99 - 108  Comprehensive metabolic panel  Result Value Ref Range   Globulin 2.4    GFR calc Af Amer 71    GFR calc non Af Amer 61    Calcium 8.9 8.7 - 10.7   Albumin 4.0 3.5 - 5.0  Hepatic function panel  Result Value Ref Range   Alkaline Phosphatase 81 25 - 125   ALT 29 7 - 35   AST 24 13 - 35   Bilirubin, Total 0.3   TSH  Result Value Ref Range   TSH 1.53 0.41 - 5.90      Assessment & Plan:   Problem List Items Addressed This Visit       Other   Chronic pain syndrome    Refer to fibromyalgia plan of care.      Relevant Medications   gabapentin (NEURONTIN) 600 MG tablet   cyclobenzaprine (FLEXERIL) 10 MG  tablet   Fibromyalgia - Primary    Has had ongoing fatigue, depression, and chronic pain for 4-5 years -- suspect this is more fibromyalgia based on her symptoms -- including unable to be touched until Gabapentin started years ago.  At this time will increase Gabapentin to 600 MG in morning, 300 MG in afternoon, and 600 MG in evening.  Recommend she take Tylenol 1000 MG with Gabapentin morning dose.  Script sent + sent in Flexeril 10 MG she can take at bedtime as needed.  May benefit from addition Cymbalta in future, but would need to discuss with psychiatry.  Could also consider pain management referral.  Obtain imaging back and hands outpatient.  Return in 2 weeks.      Relevant Medications   gabapentin (NEURONTIN) 600 MG tablet   cyclobenzaprine (FLEXERIL) 10 MG tablet   Other Relevant Orders   DG Lumbar Spine Complete   DG Hand Complete Left   DG Hand Complete Right     Follow up plan: Return in about 2 weeks (around 08/25/2021) for Fibromyalgia.

## 2021-08-11 NOTE — Patient Instructions (Signed)

## 2021-08-11 NOTE — Assessment & Plan Note (Signed)
Has had ongoing fatigue, depression, and chronic pain for 4-5 years -- suspect this is more fibromyalgia based on her symptoms -- including unable to be touched until Gabapentin started years ago.  At this time will increase Gabapentin to 600 MG in morning, 300 MG in afternoon, and 600 MG in evening.  Recommend she take Tylenol 1000 MG with Gabapentin morning dose.  Script sent + sent in Flexeril 10 MG she can take at bedtime as needed.  May benefit from addition Cymbalta in future, but would need to discuss with psychiatry.  Could also consider pain management referral.  Obtain imaging back and hands outpatient.  Return in 2 weeks.

## 2021-08-11 NOTE — Assessment & Plan Note (Signed)
Refer to fibromyalgia plan of care. 

## 2021-08-14 ENCOUNTER — Ambulatory Visit
Admission: RE | Admit: 2021-08-14 | Discharge: 2021-08-14 | Disposition: A | Discharge status: Home / Self Care | Payer: 59 | Source: Ambulatory Visit | Attending: Nurse Practitioner | Admitting: Nurse Practitioner

## 2021-08-14 DIAGNOSIS — M797 Fibromyalgia: Secondary | ICD-10-CM

## 2021-08-14 NOTE — Progress Notes (Signed)
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  Continue with current plan.  Any questions? Keep being awesome!!  Thank you for allowing me to participate in your care.  I appreciate you. Kindest regards, Andreu Drudge

## 2021-08-17 ENCOUNTER — Encounter: Payer: Self-pay | Admitting: Nurse Practitioner

## 2021-08-17 NOTE — Progress Notes (Signed)
Contacted via MyChart   Good morning Tracie James, your back imaging has returned and is overall normal.  I suspect some of the pain is more fibromyalgia related.  One question I would ask psychiatry is if Duloxetine could be added to regimen and another mental health medication stopped.  Duloxetine is a great medication for fibromyalgia and pain.  Food for thought.

## 2021-08-27 NOTE — Progress Notes (Signed)
BH MD/PA/NP OP Progress Note  08/31/2021 9:37 AM Tracie James  MRN:  [id number]  Chief Complaint:  Chief Complaint   Follow-up; Depression    HPI:  This is a follow-up appointment for depression and anxiety.  She states that she tries to think that her daughter is dead.  Although she has sent a card for holiday, she has not heard back from her.  She does not heard from the father of her daughter.  She checks in with her father to find out how this daughter is doing.  She finds the work to be helpful so that she does not need to think about her daughter.  Although there is a concern of shortage of staff, she has been handling things well.  She reports good relationship with her boyfriend.  She enjoyed going to antique shop with him the other day.  She struggles with pain, which her PCP thinks as fibromyalgia.  She has depressive symptoms as in PHQ-9.  She has passive SI, she adamantly denies any plan or intent.  She feels less anxious since being on BuSpar, and denies panic attacks.  She has not used clonazepam since the last visit.  She denies alcohol use or drug use.  She feels comfortable to stay on the current medication regimen.  Of note, she is unsure why she gains weight.  However, she is willing to work on diet and an exercise.   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 MDD (major depressive disorder), recurrent episode, moderate (HCC)  F33.1     2. Anxiety  F41.9       Past Psychiatric History: Please see initial evaluation for full details. I have reviewed the history. No updates at this time.     Past Medical History:  Past Medical History:   Diagnosis Date   Anxiety    Asthma    Bipolar 2 disorder ([place])    Depression    GERD (gastroesophageal reflux disease)    Palpitations    PVC (premature ventricular contraction)     Past Surgical History:  Procedure Laterality Date   ABDOMINAL HYSTERECTOMY     patient is not sure if it was total or complete   APPENDECTOMY      Family Psychiatric History: Please see initial evaluation for full details. I have reviewed the history. No updates at this time.     Family History:  Family History  Problem Relation Age of Onset   Depression Mother    Multiple sclerosis Mother    Schizophrenia Maternal Grandmother    Heart disease Maternal Grandmother        aortic valve replacement    Social History:  Social History   Socioeconomic History   Marital status: Divorced    Spouse name: Not on file   Number of children: Not on file   Years of education: Not on file   Highest education level: Not on file  Occupational History   Not on file  Tobacco Use   Smoking status: Former    Types: Cigarettes    Quit date: 03/31/2014    Years  since quitting: 7.4   Smokeless tobacco: Never  Vaping Use   Vaping Use: Never used  Substance and Sexual Activity   Alcohol use: No   Drug use: No   Sexual activity: Yes    Partners: Male    Birth control/protection: Surgical  Other Topics Concern   Not on file  Social History Narrative   Not on file   Social Determinants of Health   Financial Resource Strain: Not on file  Food Insecurity: Not on file  Transportation Needs: Not on file  Physical Activity: Not on file  Stress: Not on file  Social Connections: Not on file    Allergies:  Allergies  Allergen Reactions   Erythromycin Hives   Tetracyclines & Related Hives    Metabolic Disorder Labs: No results found for: HGBA1C, MPG No results found for: PROLACTIN No results found for: CHOL, TRIG, HDL, CHOLHDL, VLDL, LDLCALC Lab Results  Component Value Date   TSH 1.53  12/30/2020   TSH 2.100 12/07/2019    Therapeutic Level Labs: No results found for: LITHIUM No results found for: VALPROATE No components found for:  CBMZ  Current Medications: Current Outpatient Medications  Medication Sig Dispense Refill   buPROPion (WELLBUTRIN XL) 150 MG 24 hr tablet Take 1 tablet (150 mg total) by mouth daily. Take total of 450 mg daily. Along with 300 mg tab 90 tablet 1   buPROPion (WELLBUTRIN XL) 300 MG 24 hr tablet Take 300 mg daily and 150 mg daily (total of 450 mg) 90 tablet 1   CINNAMON PO Take 1,000 mg by mouth daily.     cyclobenzaprine (FLEXERIL) 10 MG tablet Take 1 tablet (10 mg total) by mouth at bedtime as needed for muscle spasms. 30 tablet 1   FLUoxetine (PROZAC) 40 MG capsule Take 2 capsules (80 mg total) by mouth daily. 180 capsule 1   gabapentin (NEURONTIN) 600 MG tablet Take 600 MG (1 tablet) by mouth in the morning, and then 300 MG (1/2 tablet) by mouth at noon, and then 600 MG (1 tablet) by mouth in the evening before bed. 225 tablet 4   lansoprazole (PREVACID) 15 MG capsule Take 15 mg by mouth daily at 12 noon.     LORazepam (ATIVAN) 1 MG tablet Take 1 tablet (1 mg total) by mouth daily as needed for anxiety. 30 tablet 2   metoprolol succinate (TOPROL-XL) 50 MG 24 hr tablet TAKE WITH OR IMMEDIATELY FOLLOWING A MEAL. 90 tablet 0   naproxen (NAPROSYN) 500 MG tablet Take 1 tablet (500 mg total) by mouth 2 (two) times daily with a meal. 180 tablet 0   valACYclovir (VALTREX) 1000 MG tablet Take 1 tablet (1,000 mg total) by mouth 2 (two) times daily. 60 tablet 4   vitamin B-12 (CYANOCOBALAMIN) 1000 MCG tablet Take 5,000 mcg by mouth daily.      Vitamin D, Cholecalciferol, 1000 UNITS TABS Take 5,000 Units by mouth daily.      [START ON 09/21/2021] busPIRone (BUSPAR) 5 MG tablet Take 1 tablet (5 mg total) by mouth 2 (two) times daily. 180 tablet 0   No current facility-administered medications for this visit.     Musculoskeletal: Strength & Muscle Tone:   N/A Gait & Station:  N/A Patient leans: N/A  Psychiatric Specialty Exam: Review of Systems  Psychiatric/Behavioral:  Positive for dysphoric mood, sleep disturbance and suicidal ideas. Negative for agitation, behavioral problems, confusion, decreased concentration, hallucinations and self-injury. The patient is nervous/anxious. The patient is not hyperactive.   All  other systems reviewed and are negative.  Blood pressure (!) 160/78, pulse 92, temperature 97.6 F (36.4 C), temperature source Temporal, weight 233 lb 3.2 oz (105.8 kg).Body mass index is 40.03 kg/m.  General Appearance: Fairly Groomed  Eye Contact:  Good  Speech:  Clear and Coherent  Volume:  Normal  Mood:   fine  Affect:  Appropriate, Congruent, and Tearful  Thought Process:  Coherent  Orientation:  Full (Time, Place, and Person)  Thought Content: Logical   Suicidal Thoughts:  No  Homicidal Thoughts:  No  Memory:  Immediate;   Good  Judgement:  Good  Insight:  Good  Psychomotor Activity:  Normal  Concentration:  Concentration: Good and Attention Span: Good  Recall:  Good  Fund of Knowledge: Good  Language: Good  Akathisia:  No  Handed:  Right  AIMS (if indicated): not done  Assets:  Communication Skills Desire for Improvement  ADL's:  Intact  Cognition: WNL  Sleep:  Good   Screenings: GAD-7    Flowsheet Row Office Visit from 07/17/2021 in [facility] Visit from 07/21/2020 in [facility] Visit from 01/01/2020 in [facility] Visit from 06/20/2019 in [facility] Visit from 05/09/2018 in [facility]  Total GAD-7 Score 21 21 21 21 21       PHQ2-9    [place] Visit from 08/31/2021 in [facility] Office Visit from 07/17/2021 in [facility] Video Visit from 11/07/2020 in [facility] Office Visit from 07/21/2020 in [facility]  Visit from 01/01/2020 in [facility]  PHQ-2 Total Score 2 2 2 1 6   PHQ-9 Total Score 10 13 4 2 23       [place] Office Visit from 08/31/2021 in [facility] from 07/09/2021 in [facility] Video Visit from 11/07/2020 in [facility] No Risk Error: Q3, 4, or 5 should not be populated when Q2 is No        Assessment and Plan:  Tracie James is a 51 y.o. year old female with a history of  depression, who presents for follow up appointment for below.   1. MDD (major depressive disorder), recurrent episode, moderate ([place]) 2. Anxiety There has been overall improvement in anxiety since starting BuSpar, although she continues to report depressive symptoms in the context of losing the custody of her 102 year old daughter.  She denies any struggles at work, and reports good relationship with her boyfriend.  Will continue fluoxetine to target depression and anxiety.  Will continue bupropion as adjunctive treatment for depression.  Will continue BuSpar for anxiety.  Will continue lorazepam as needed for anxiety.  Noted that she has had weight gain over the past few years.  Will continue to monitor.  Discussed healthier diet and regular exercise.   # Hypertension She attributes hypertension to pain she experiences.  She is followed by her PCP.     Plan 1. Continue fluoxetine 80 mg daily (monitor weight gain) 2. Continue Bupropion 450 mg daily 3. Continue Buspar 5 mg twice day  4 . Continue lorazepam 1 mg as needed for anxiety - she declined refill 5. Next appointment: 5/9 at 8 AM for 20 mins, video - She sees a therapist once a month   Past trials of medication: Cymbalta, Zoloft, Prozac, bupropion, Lithium (sick), Depakote (spending money), Latuda, Abilify (did not work), quetiapine (weight gain,  somnolence). Geodon (PVC occurred)    I have reviewed suicide  assessment in detail. No change in the following assessment.    The patient demonstrates the following risk factors for suicide: Chronic risk factors for suicide include: psychiatric disorder of depression and previous suicide attempts of overdosing medication. Acute risk factors for suicide include: family or marital conflict. Protective factors for this patient include: responsibility to others (children, family), coping skills and hope for the future. Patient is future oriented and agrees to come for next appointment. Considering these factors, the overall suicide risk at this point appears to be low. She is future oriented, and is amenable to treatment. Patient is appropriate for outpatient follow up.  Norman Clay, MD 08/31/2021, 9:37 AM

## 2021-08-31 ENCOUNTER — Encounter: Payer: Self-pay | Admitting: Psychiatry

## 2021-08-31 ENCOUNTER — Ambulatory Visit (INDEPENDENT_AMBULATORY_CARE_PROVIDER_SITE_OTHER): Payer: 59 | Admitting: Psychiatry

## 2021-08-31 ENCOUNTER — Other Ambulatory Visit: Payer: Self-pay

## 2021-08-31 VITALS — BP 160/78 | HR 92 | Temp 97.6°F | Wt 233.2 lb

## 2021-08-31 DIAGNOSIS — F419 Anxiety disorder, unspecified: Secondary | ICD-10-CM

## 2021-08-31 DIAGNOSIS — F331 Major depressive disorder, recurrent, moderate: Secondary | ICD-10-CM

## 2021-08-31 MED ORDER — BUSPIRONE HCL 5 MG PO TABS: 5.0000 mg | ORAL_TABLET | Freq: Two times a day (BID) | ORAL | 0 refills | Status: DC

## 2021-08-31 NOTE — Patient Instructions (Signed)
1. Continue fluoxetine 80 mg daily  2. Continue Bupropion 450 mg daily 3. Continue Buspar 5 mg twice day  4 . Continue lorazepam 1 mg as needed for anxiety  5. Next appointment: 5/9 at 8 AM

## 2021-09-01 ENCOUNTER — Ambulatory Visit: Payer: 59 | Admitting: Nurse Practitioner

## 2021-10-06 ENCOUNTER — Other Ambulatory Visit: Payer: Self-pay | Admitting: Nurse Practitioner

## 2021-10-06 ENCOUNTER — Other Ambulatory Visit: Payer: Self-pay | Admitting: Family Medicine

## 2021-10-07 ENCOUNTER — Other Ambulatory Visit: Payer: Self-pay | Admitting: Nurse Practitioner

## 2021-10-07 MED ORDER — GABAPENTIN 600 MG PO TABS: ORAL_TABLET | ORAL | 4 refills | Status: DC

## 2021-10-07 NOTE — Telephone Encounter (Signed)
Requested Prescriptions  ?Pending Prescriptions Disp Refills  ?? gabapentin (NEURONTIN) 300 MG capsule [Pharmacy Med Name: GABAPENTIN 300 MG CAPSULE] 270 capsule 0  ?  Sig: TAKE 1 CAPSULE BY MOUTH THREE TIMES A DAY  ?  ? Neurology: Anticonvulsants - gabapentin Passed - 10/06/2021  5:26 AM  ?  ?  Passed - Cr in normal range and within 360 days  ?  Creatinine  ?Date Value Ref Range Status  ?12/30/2020 1.1 0.5 - 1.1 Final  ? ?Creatinine, Ser  ?Date Value Ref Range Status  ?12/07/2019 1.21 (H) 0.57 - 1.00 mg/dL Final  ?   ?  ?  Passed - Completed PHQ-2 or PHQ-9 in the last 360 days  ?  ?  Passed - Valid encounter within last 12 months  ?  Recent Outpatient Visits   ?      ? 1 month ago Fibromyalgia  ? [place], Lonoke T, NP  ? 2 months ago Palpitations  ? [place], Megan P, DO  ? 9 months ago Palpitations  ? [place], NP  ? 1 year ago Ulcerated tongue  ? [place], [place] T, NP  ? 1 year ago Major depressive disorder, recurrent episode, moderate ([place])  ? [facility] [place], Henrine Screws T, NP  ?  ?  ?Future Appointments   ?        ? In 1 week Cannady, Barbaraann Faster, NP [facility]  ?  ? ?  ?  ?  ?? naproxen (NAPROSYN) 500 MG tablet [Pharmacy Med Name: NAPROXEN 500 MG TABLET] 180 tablet 0  ?  Sig: TAKE 1 TABLET BY MOUTH 2 TIMES DAILY WITH A MEAL.  ?  ? Analgesics:  NSAIDS Failed - 10/06/2021  5:26 AM  ?  ?  Failed - Manual Review: Labs are only required if the patient has taken medication for more than 8 weeks.  ?  ?  Failed - HGB in normal range and within 360 days  ?  Hemoglobin  ?Date Value Ref Range Status  ?12/30/2020 11.8 (A) 12.0 - 16.0 Final  ?12/07/2019 13.6 11.1 - 15.9 g/dL Final  ?   ?  ?  Passed - Cr in normal range and within 360 days  ?  Creatinine  ?Date Value Ref Range Status  ?12/30/2020 1.1 0.5 - 1.1 Final  ? ?Creatinine, Ser  ?Date Value Ref Range Status  ?12/07/2019 1.21 (H) 0.57  - 1.00 mg/dL Final  ?   ?  ?  Passed - PLT in normal range and within 360 days  ?  Platelets  ?Date Value Ref Range Status  ?12/30/2020 249 150 - 399 Final  ?12/07/2019 269 150 - 450 x10E3/uL Final  ?   ?  ?  Passed - HCT in normal range and within 360 days  ?  HCT  ?Date Value Ref Range Status  ?12/30/2020 37 36 - 46 Final  ? ?Hematocrit  ?Date Value Ref Range Status  ?12/07/2019 40.2 34.0 - 46.6 % Final  ?   ?  ?  Passed - eGFR is 30 or above and within 360 days  ?  GFR calc Af Amer  ?Date Value Ref Range Status  ?12/30/2020 71  Final  ? ?GFR calc non Af Amer  ?Date Value Ref Range Status  ?12/30/2020 61  Final  ?   ?  ?  Passed - Patient is not pregnant  ?  ?  Passed - Valid encounter within last 12 months  ?  Recent Outpatient Visits   ?      ? 1 month ago Fibromyalgia  ? [place], [place] T, NP  ? 2 months ago Palpitations  ? [place], Megan P, DO  ? 9 months ago Palpitations  ? [place], NP  ? 1 year ago Ulcerated tongue  ? [place], [place] T, NP  ? 1 year ago Major depressive disorder, recurrent episode, moderate ([place])  ? [facility] [place], Henrine Screws T, NP  ?  ?  ?Future Appointments   ?        ? In 1 week Cannady, Barbaraann Faster, NP [facility]  ?  ? ?  ?  ?  ? ?

## 2021-10-07 NOTE — Telephone Encounter (Signed)
Requested medication (s) are due for refill today: Yes ? ?Requested medication (s) are on the active medication list: Yes ? ?Last refill:  08/11/21 ? ?Future visit scheduled: Yes ? ?Notes to clinic:  Unable to refill per protocol, cannot delegate. ? ? ? ? ? ?Requested Prescriptions  ?Pending Prescriptions Disp Refills  ? cyclobenzaprine (FLEXERIL) 10 MG tablet [Pharmacy Med Name: CYCLOBENZAPRINE 10 MG TABLET] 30 tablet 1  ?  Sig: TAKE 1 TABLET BY MOUTH AT BEDTIME AS NEEDED FOR MUSCLE SPASMS  ?  ? Not Delegated - Analgesics:  Muscle Relaxants Failed - 10/06/2021  5:26 AM  ?  ?  Failed - This refill cannot be delegated  ?  ?  Passed - Valid encounter within last 6 months  ?  Recent Outpatient Visits   ? ?      ? 1 month ago Fibromyalgia  ? [place], [place] T, NP  ? 2 months ago Palpitations  ? [place], Megan P, DO  ? 9 months ago Palpitations  ? [place], NP  ? 1 year ago Ulcerated tongue  ? [place], [place] T, NP  ? 1 year ago Major depressive disorder, recurrent episode, moderate ([place])  ? [facility] [place], Henrine Screws T, NP  ? ?  ?  ?Future Appointments   ? ?        ? In 1 week Cannady, Barbaraann Faster, NP [facility]  ? ?  ? ?  ?  ?  ? ? ? ? ?

## 2021-10-07 NOTE — Telephone Encounter (Signed)
Requested medications are due for refill today.  no ? ?Requested medications are on the active medications list.  no ? ?Last refill. 07/17/2021 #270 0 refills ? ?Future visit scheduled.   yes ? ?Notes to clinic.  This dosage was discontinued 08/11/2021 ? ? ? ?Requested Prescriptions  ?Pending Prescriptions Disp Refills  ? gabapentin (NEURONTIN) 300 MG capsule [Pharmacy Med Name: GABAPENTIN 300 MG CAPSULE] 270 capsule 0  ?  Sig: TAKE 1 CAPSULE BY MOUTH THREE TIMES A DAY  ?  ? Neurology: Anticonvulsants - gabapentin Passed - 10/06/2021  5:26 AM  ?  ?  Passed - Cr in normal range and within 360 days  ?  Creatinine  ?Date Value Ref Range Status  ?12/30/2020 1.1 0.5 - 1.1 Final  ? ?Creatinine, Ser  ?Date Value Ref Range Status  ?12/07/2019 1.21 (H) 0.57 - 1.00 mg/dL Final  ?  ?  ?  ?  Passed - Completed PHQ-2 or PHQ-9 in the last 360 days  ?  ?  Passed - Valid encounter within last 12 months  ?  Recent Outpatient Visits   ? ?      ? 1 month ago Fibromyalgia  ? [place], [place] T, NP  ? 2 months ago Palpitations  ? [place], Megan P, DO  ? 9 months ago Palpitations  ? [place], NP  ? 1 year ago Ulcerated tongue  ? [place], [place] T, NP  ? 1 year ago Major depressive disorder, recurrent episode, moderate ([place])  ? [facility] [place], Henrine Screws T, NP  ? ?  ?  ?Future Appointments   ? ?        ? In 1 week Cannady, Barbaraann Faster, NP [facility]  ? ?  ? ?  ?  ?  ?Signed Prescriptions Disp Refills  ? naproxen (NAPROSYN) 500 MG tablet 180 tablet 0  ?  Sig: TAKE 1 TABLET BY MOUTH 2 TIMES DAILY WITH A MEAL.  ?  ? Analgesics:  NSAIDS Failed - 10/06/2021  5:26 AM  ?  ?  Failed - Manual Review: Labs are only required if the patient has taken medication for more than 8 weeks.  ?  ?  Failed - HGB in normal range and within 360 days  ?  Hemoglobin  ?Date Value Ref Range Status  ?12/30/2020 11.8 (A) 12.0 - 16.0  Final  ?12/07/2019 13.6 11.1 - 15.9 g/dL Final  ?  ?  ?  ?  Passed - Cr in normal range and within 360 days  ?  Creatinine  ?Date Value Ref Range Status  ?12/30/2020 1.1 0.5 - 1.1 Final  ? ?Creatinine, Ser  ?Date Value Ref Range Status  ?12/07/2019 1.21 (H) 0.57 - 1.00 mg/dL Final  ?  ?  ?  ?  Passed - PLT in normal range and within 360 days  ?  Platelets  ?Date Value Ref Range Status  ?12/30/2020 249 150 - 399 Final  ?12/07/2019 269 150 - 450 x10E3/uL Final  ?  ?  ?  ?  Passed - HCT in normal range and within 360 days  ?  HCT  ?Date Value Ref Range Status  ?12/30/2020 37 36 - 46 Final  ? ?Hematocrit  ?Date Value Ref Range Status  ?12/07/2019 40.2 34.0 - 46.6 % Final  ?  ?  ?  ?  Passed - eGFR is 30 or above and within 360 days  ?  GFR calc Af Amer  ?Date Value Ref Range Status  ?12/30/2020 71  Final  ? ?GFR calc non Af Amer  ?Date Value Ref Range Status  ?12/30/2020 61  Final  ?  ?  ?  ?  Passed - Patient is not pregnant  ?  ?  Passed - Valid encounter within last 12 months  ?  Recent Outpatient Visits   ? ?      ? 1 month ago Fibromyalgia  ? [place], [place] T, NP  ? 2 months ago Palpitations  ? [place], Megan P, DO  ? 9 months ago Palpitations  ? [place], NP  ? 1 year ago Ulcerated tongue  ? [place], [place] T, NP  ? 1 year ago Major depressive disorder, recurrent episode, moderate ([place])  ? [facility] [place], Henrine Screws T, NP  ? ?  ?  ?Future Appointments   ? ?        ? In 1 week Cannady, Barbaraann Faster, NP [facility]  ? ?  ? ?  ?  ?  ?  ?

## 2021-10-10 ENCOUNTER — Other Ambulatory Visit: Payer: Self-pay | Admitting: Psychiatry

## 2021-10-10 DIAGNOSIS — F330 Major depressive disorder, recurrent, mild: Secondary | ICD-10-CM

## 2021-10-11 ENCOUNTER — Other Ambulatory Visit: Payer: Self-pay | Admitting: Nurse Practitioner

## 2021-10-11 NOTE — Patient Instructions (Incomplete)
Managing Anxiety, Adult ?After being diagnosed with anxiety, you may be relieved to know why you have felt or behaved a certain way. You may also feel overwhelmed about the treatment ahead and what it will mean for your life. With care and support, you can manage this condition. ?How to manage lifestyle changes ?Managing stress and anxiety ?Stress is your body's reaction to life changes and events, both good and bad. Most stress will last just a few hours, but stress can be ongoing and can lead to more than just stress. Although stress can play a major role in anxiety, it is not the same as anxiety. Stress is usually caused by something external, such as a deadline, test, or competition. Stress normally passes after the triggering event has ended.  ?Anxiety is caused by something internal, such as imagining a terrible outcome or worrying that something will go wrong that will devastate you. Anxiety often does not go away even after the triggering event is over, and it can become long-term (chronic) worry. It is important to understand the differences between stress and anxiety and to manage your stress effectively so that it does not lead to an anxious response. ?Talk with your health care provider or a counselor to learn more about reducing anxiety and stress. He or she may suggest tension reduction techniques, such as: ?Music therapy. Spend time creating or listening to music that you enjoy and that inspires you. ?Mindfulness-based meditation. Practice being aware of your normal breaths while not trying to control your breathing. It can be done while sitting or walking. ?Centering prayer. This involves focusing on a word, phrase, or sacred image that means something to you and brings you peace. ?Deep breathing. To do this, expand your stomach and inhale slowly through your nose. Hold your breath for 3-5 seconds. Then exhale slowly, letting your stomach muscles relax. ?Self-talk. Learn to notice and identify  thought patterns that lead to anxiety reactions and change those patterns to thoughts that feel peaceful. ?Muscle relaxation. Taking time to tense muscles and then relax them. ?Choose a tension reduction technique that fits your lifestyle and personality. These techniques take time and practice. Set aside 5-15 minutes a day to do them. Therapists can offer counseling and training in these techniques. The training to help with anxiety may be covered by some insurance plans. ?Other things you can do to manage stress and anxiety include: ?Keeping a stress diary. This can help you learn what triggers your reaction and then learn ways to manage your response. ?Thinking about how you react to certain situations. You may not be able to control everything, but you can control your response. ?Making time for activities that help you relax and not feeling guilty about spending your time in this way. ?Doing visual imagery. This involves imagining or creating mental pictures to help you relax. ?Practicing yoga. Through yoga poses, you can lower tension and promote relaxation. ? ?Medicines ?Medicines can help ease symptoms. Medicines for anxiety include: ?Antidepressant medicines. These are usually prescribed for long-term daily control. ?Anti-anxiety medicines. These may be added in severe cases, especially when panic attacks occur. ?Medicines will be prescribed by a health care provider. When used together, medicines, psychotherapy, and tension reduction techniques may be the most effective treatment. ?Relationships ?Relationships can play a big part in helping you recover. Try to spend more time connecting with trusted friends and family members. ?Consider going to couples counseling if you have a partner, taking family education classes, or going to family   therapy. ?Therapy can help you and others better understand your condition. ?How to recognize changes in your anxiety ?Everyone responds differently to treatment for  anxiety. Recovery from anxiety happens when symptoms decrease and stop interfering with your daily activities at home or work. This may mean that you will start to: ?Have better concentration and focus. Worry will interfere less in your daily thinking. ?Sleep better. ?Be less irritable. ?Have more energy. ?Have improved memory. ?It is also important to recognize when your condition is getting worse. Contact your health care provider if your symptoms interfere with home or work and you feel like your condition is not improving. ?Follow these instructions at home: ?Activity ?Exercise. Adults should do the following: ?Exercise for at least 150 minutes each week. The exercise should increase your heart rate and make you sweat (moderate-intensity exercise). ?Strengthening exercises at least twice a week. ?Get the right amount and quality of sleep. Most adults need 7-9 hours of sleep each night. ?Lifestyle ? ?Eat a healthy diet that includes plenty of vegetables, fruits, whole grains, low-fat dairy products, and lean protein. ?Do not eat a lot of foods that are high in fats, added sugars, or salt (sodium). ?Make choices that simplify your life. ?Do not use any products that contain nicotine or tobacco. These products include cigarettes, chewing tobacco, and vaping devices, such as e-cigarettes. If you need help quitting, ask your health care provider. ?Avoid caffeine, alcohol, and certain over-the-counter cold medicines. These may make you feel worse. Ask your pharmacist which medicines to avoid. ?General instructions ?Take over-the-counter and prescription medicines only as told by your health care provider. ?Keep all follow-up visits. This is important. ?Where to find support ?You can get help and support from these sources: ?Self-help groups. ?Online and community organizations. ?A trusted spiritual leader. ?Couples counseling. ?Family education classes. ?Family therapy. ?Where to find more information ?You may find  that joining a support group helps you deal with your anxiety. The following sources can help you locate counselors or support groups near you: ?Mental Health America: www.mentalhealthamerica.net ?Anxiety and Depression Association of America (ADAA): www.adaa.org ?National Alliance on Mental Illness (NAMI): www.nami.org ?Contact a health care provider if: ?You have a hard time staying focused or finishing daily tasks. ?You spend many hours a day feeling worried about everyday life. ?You become exhausted by worry. ?You start to have headaches or frequently feel tense. ?You develop chronic nausea or diarrhea. ?Get help right away if: ?You have a racing heart and shortness of breath. ?You have thoughts of hurting yourself or others. ?If you ever feel like you may hurt yourself or others, or have thoughts about taking your own life, get help right away. Go to your nearest emergency department or: ?Call your local emergency services (911 in the [place] ?Call a suicide crisis helpline, such as the National Suicide Prevention Lifeline at 1-800-273-8255 or 988 in the [place] This is open 24 hours a day in the [place] ?Text the Crisis Text Line at 741741 (in the [place] ?Summary ?Taking steps to learn and use tension reduction techniques can help calm you and help prevent triggering an anxiety reaction. ?When used together, medicines, psychotherapy, and tension reduction techniques may be the most effective treatment. ?Family, friends, and partners can play a big part in supporting you. ?This information is not intended to replace advice given to you by your health care provider. Make sure you discuss any questions you have with your health care provider. ?Document Revised: 01/28/2021 Document Reviewed: 10/26/2020 ?Elsevier Patient   Education ? 2022 Elsevier Inc. ? ?

## 2021-10-13 NOTE — Telephone Encounter (Signed)
Requested Prescriptions  ?Pending Prescriptions Disp Refills  ?? metoprolol succinate (TOPROL-XL) 50 MG 24 hr tablet [Pharmacy Med Name: METOPROLOL SUCC ER 50 MG TAB] 90 tablet 0  ?  Sig: TAKE WITH OR IMMEDIATELY FOLLOWING A MEAL.  ?  ? Cardiovascular:  Beta Blockers Failed - 10/11/2021  9:31 AM  ?  ?  Failed - Last BP in normal range  ?  BP Readings from Last 1 Encounters:  ?07/17/21 118/75  ?   ?  ?  Passed - Last Heart Rate in normal range  ?  Pulse Readings from Last 1 Encounters:  ?07/17/21 80  ?   ?  ?  Passed - Valid encounter within last 6 months  ?  Recent Outpatient Visits   ?      ? 2 months ago Fibromyalgia  ? [facility] [place], [place] T, NP  ? 2 months ago Palpitations  ? [facility] [place], Megan P, DO  ? 9 months ago Palpitations  ? [facility] [place], Lauren A, NP  ? 1 year ago Ulcerated tongue  ? [facility] [place], [place] T, NP  ? 1 year ago Major depressive disorder, recurrent episode, moderate (HCC)  ? [facility] [place], Corrie Dandy T, NP  ?  ?  ? ?  ?  ?  ? ? ?

## 2021-10-15 ENCOUNTER — Other Ambulatory Visit: Payer: Self-pay | Admitting: Psychiatry

## 2021-10-16 ENCOUNTER — Encounter: Payer: 59 | Admitting: Nurse Practitioner

## 2021-10-19 ENCOUNTER — Encounter: Payer: Self-pay | Admitting: Podiatry

## 2021-10-19 ENCOUNTER — Ambulatory Visit (INDEPENDENT_AMBULATORY_CARE_PROVIDER_SITE_OTHER): Payer: 59 | Admitting: Podiatry

## 2021-10-19 DIAGNOSIS — B351 Tinea unguium: Secondary | ICD-10-CM | POA: Diagnosis not present

## 2021-10-19 MED ORDER — TERBINAFINE HCL 250 MG PO TABS: 250.0000 mg | ORAL_TABLET | Freq: Every day | ORAL | 0 refills | Status: DC

## 2021-10-19 NOTE — Progress Notes (Signed)
? ?  Subjective: ?51 y.o. female presenting today as a new patient for evaluation of nail dystrophy to the bilateral great toes as well as the lesser digits of the feet.  Patient states that she is very embarrassed by her toenails.  She says that they have been thick and discolored for several years.  She has tried OTC topical antifungal with no improvement. ? ?Past Medical History:  ?Diagnosis Date  ? Anxiety   ? Asthma   ? Bipolar 2 disorder (HCC)   ? Depression   ? GERD (gastroesophageal reflux disease)   ? Palpitations   ? PVC (premature ventricular contraction)   ? ?Past Surgical History:  ?Procedure Laterality Date  ? ABDOMINAL HYSTERECTOMY    ? patient is not sure if it was total or complete  ? APPENDECTOMY    ? ?Allergies  ?Allergen Reactions  ? Erythromycin Hives  ? Tetracyclines & Related Hives  ? ? ?Objective: ?Physical Exam ?General: The patient is alert and oriented x3 in no acute distress. ? ?Dermatology: Hyperkeratotic, discolored, thickened, onychodystrophy noted digits 1-5 bilateral. Skin is warm, dry and supple bilateral lower extremities. Negative for open lesions or macerations. ? ?Vascular: Palpable pedal pulses bilaterally. No edema or erythema noted. Capillary refill within normal limits. ? ?Neurological: Epicritic and protective threshold grossly intact bilaterally.  ? ?Musculoskeletal Exam: Range of motion within normal limits to all pedal and ankle joints bilateral. Muscle strength 5/5 in all groups bilateral.  ? ?Assessment: ?#1 Onychomycosis of toenails ? ?Plan of Care:  ?#1 Patient was evaluated. ?#2  Today we discussed different treatment options including oral, topical, and laser antifungal treatment modalities.  We discussed their efficacies and side effects.  Patient opts for oral antifungal treatment modality. ?#3 prescription for Lamisil 250 mg #90 daily.  She denies a history of liver pathology or symptoms.  Patient is otherwise healthy.  Patient actually works for [employer] and recently had a comprehensive metabolic panel which was within normal limits ?#4 return to clinic 6 months ? ?*Works for [employer]. ? ? ?Felecia Shelling, DPM ?[facility] ? ?Dr. Felecia Shelling, DPM  ?  ?[address]                                    ?[place], [place] [address]                ?Office [phone]  ?Fax [phone] ? ? ? ? ?

## 2021-10-20 ENCOUNTER — Telehealth: Payer: Self-pay

## 2021-10-20 NOTE — Telephone Encounter (Signed)
Could you ask her which medication she needs refill. I declined refill of buspar, bupropion 150 mg as she should have enough until her next visit according to the chart.

## 2021-10-20 NOTE — Telephone Encounter (Signed)
pt called let a message that [facility] request to the provider and has not heard back. pt states she needs refills on her medications sent as soon as possible. ?

## 2021-10-21 ENCOUNTER — Other Ambulatory Visit: Payer: Self-pay | Admitting: Psychiatry

## 2021-10-21 DIAGNOSIS — F330 Major depressive disorder, recurrent, mild: Secondary | ICD-10-CM

## 2021-10-21 MED ORDER — BUPROPION HCL ER (XL) 150 MG PO TB24: 150.0000 mg | ORAL_TABLET | Freq: Every day | ORAL | 1 refills | Status: DC

## 2021-10-21 NOTE — Telephone Encounter (Signed)
Discussed with the pharmacy. There is a refill left for month supply of bupropion 150 mg. She received 30 day supply in Feb, although the original order was for 90 days. The pharmacy is unsure whether she can fill 90 days this time, although the patient requested for 90 days.  ?- ordered bupropion 150 mg daily for 90 days again to see if she can get this 90 days.

## 2021-10-21 NOTE — Telephone Encounter (Signed)
She called and left a message that pharmacy has tried 3 time to get refill and yes she needs it.  ?

## 2021-10-24 ENCOUNTER — Emergency Department: Payer: 59

## 2021-10-24 ENCOUNTER — Other Ambulatory Visit: Payer: Self-pay

## 2021-10-24 ENCOUNTER — Emergency Department
Admission: EM | Admit: 2021-10-24 | Discharge: 2021-10-24 | Disposition: A | Discharge status: Home / Self Care | Payer: 59 | Attending: Emergency Medicine | Admitting: Emergency Medicine

## 2021-10-24 DIAGNOSIS — R569 Unspecified convulsions: Secondary | ICD-10-CM | POA: Diagnosis not present

## 2021-10-24 DIAGNOSIS — E860 Dehydration: Secondary | ICD-10-CM | POA: Diagnosis not present

## 2021-10-24 DIAGNOSIS — D72829 Elevated white blood cell count, unspecified: Secondary | ICD-10-CM | POA: Diagnosis not present

## 2021-10-24 DIAGNOSIS — J45909 Unspecified asthma, uncomplicated: Secondary | ICD-10-CM | POA: Diagnosis not present

## 2021-10-24 LAB — CBC WITH DIFFERENTIAL/PLATELET
Abs Immature Granulocytes: 0.09 10*3/uL — ABNORMAL HIGH (ref 0.00–0.07)
Basophils Absolute: 0 10*3/uL (ref 0.0–0.1)
Basophils Relative: 0 %
Eosinophils Absolute: 0 10*3/uL (ref 0.0–0.5)
Eosinophils Relative: 0 %
HCT: 38.7 % (ref 36.0–46.0)
Hemoglobin: 12.5 g/dL (ref 12.0–15.0)
Immature Granulocytes: 1 %
Lymphocytes Relative: 5 %
Lymphs Abs: 0.8 10*3/uL (ref 0.7–4.0)
MCH: 27.6 pg (ref 26.0–34.0)
MCHC: 32.3 g/dL (ref 30.0–36.0)
MCV: 85.4 fL (ref 80.0–100.0)
Monocytes Absolute: 0.6 10*3/uL (ref 0.1–1.0)
Monocytes Relative: 4 %
Neutro Abs: 15.4 10*3/uL — ABNORMAL HIGH (ref 1.7–7.7)
Neutrophils Relative %: 90 %
Platelets: 137 10*3/uL — ABNORMAL LOW (ref 150–400)
RBC: 4.53 MIL/uL (ref 3.87–5.11)
RDW: 12.8 % (ref 11.5–15.5)
WBC: 17 10*3/uL — ABNORMAL HIGH (ref 4.0–10.5)
nRBC: 0 % (ref 0.0–0.2)

## 2021-10-24 LAB — COMPREHENSIVE METABOLIC PANEL
ALT: 28 U/L (ref 0–44)
AST: 33 U/L (ref 15–41)
Albumin: 3.7 g/dL (ref 3.5–5.0)
Alkaline Phosphatase: 76 U/L (ref 38–126)
Anion gap: 10 (ref 5–15)
BUN: 20 mg/dL (ref 6–20)
CO2: 22 mmol/L (ref 22–32)
Calcium: 9.3 mg/dL (ref 8.9–10.3)
Chloride: 100 mmol/L (ref 98–111)
Creatinine, Ser: 1.3 mg/dL — ABNORMAL HIGH (ref 0.44–1.00)
GFR, Estimated: 49 mL/min — ABNORMAL LOW (ref >60)
Glucose, Bld: 116 mg/dL — ABNORMAL HIGH (ref 70–99)
Potassium: 4.5 mmol/L (ref 3.5–5.1)
Sodium: 132 mmol/L — ABNORMAL LOW (ref 135–145)
Total Bilirubin: 1 mg/dL (ref 0.3–1.2)
Total Protein: 7 g/dL (ref 6.5–8.1)

## 2021-10-24 LAB — CBG MONITORING, ED: Glucose-Capillary: 127 mg/dL — ABNORMAL HIGH (ref 70–99)

## 2021-10-24 MED ORDER — LAMOTRIGINE 25 MG PO TABS: ORAL_TABLET | ORAL | 0 refills | Status: DC

## 2021-10-24 NOTE — ED Notes (Signed)
Pt NAD, a/ox4. Pt verbalizes understanding of all DC and f/u instructions. All questions answered. Pt walks with steady gait to lobby at DC.  ? ?

## 2021-10-24 NOTE — ED Notes (Signed)
Pt NAD in bed, a/ox4, clear speech. Per friend, arrived at pt house at approximately 520 and noticed pt was slurring her speech. Friend went upstairs, came down around 7 when she witnessed pt standing and begin to convulse falling forward onto the floor. Friend states she could have hit head on floor. -trauma, oral trauma or incontintence noted on assessment. -NIHSS. Pt states she has had 2 of these episodes over the years but no DX. Sz pads applied ?

## 2021-10-24 NOTE — Discharge Instructions (Signed)
As we discussed you will also need to work with your prescribing doctor to decrease your dose of Wellbutrin over time.  Please hydrate with fluids over the weekend have your kidney function rechecked this coming week.  As we discussed until cleared to do so by neurology you may not drive or operate heavy machinery or do any tasks or you could endanger yourself or others if you become unconscious and have a seizure. ?

## 2021-10-24 NOTE — ED Provider Notes (Signed)
? ?[facility] ?Provider Note ? ? ? Event Date/Time  ? First MD Initiated Contact with Patient 10/24/21 2032   ?  (approximate) ? ? ?History  ? ?Seizures ? ? ?HPI ? ?Tracie James is a 51 y.o. female with a past medical history of anxiety and depression on Wellbutrin, BuSpar and lorazepam as well as GERD palpitations and bipolar disorder as well as asthma who presents for evaluation after witnessed seizure that occurred with daughter.  Patient did not fall and struck her arms and she had some bruising but she denies any pain including headache, neck pain or any pain in her extremities including with movement.  Her last seizure was a couple months ago and the 1 before that several years ago and she has not followed up with neurology and she is not on any seizure medications.  No other recent sick symptoms including fevers, chills, cough, nausea, vomiting, diarrhea, rash or recent passing out episodes.  She only occasionally drinks some wine.  No illicit drug use.  She has been getting of sleep.  No recent changes to her medications.  No other acute concerns at this time ? ?Past Medical History:  ?Diagnosis Date  ? Anxiety   ? Asthma   ? Bipolar 2 disorder ([place])   ? Depression   ? GERD (gastroesophageal reflux disease)   ? Palpitations   ? PVC (premature ventricular contraction)   ? ? ? ?  ? ? ?Physical Exam  ?Triage Vital Signs: ?ED Triage Vitals  ?Enc Vitals Group  ?   BP 10/24/21 2025 103/64  ?   Pulse Rate 10/24/21 2025 84  ?   Resp 10/24/21 2025 19  ?   Temp 10/24/21 2025 98.4 ?F (36.9 ?C)  ?   Temp Source 10/24/21 2025 Oral  ?   SpO2 10/24/21 2025 94 %  ?   Weight 10/24/21 2027 225 lb (102.1 kg)  ?   Height 10/24/21 2027 5\' 4"  (1.626 m)  ?   Head Circumference --   ?   Peak Flow --   ?   Pain Score 10/24/21 2027 0  ?   Pain Loc --   ?   Pain Edu? --   ?   Excl. in [place]? --   ? ? ?Most recent vital signs: ?Vitals:  ? 10/24/21 2025 10/24/21 2200  ?BP: 103/64 105/65  ?Pulse: 84 78  ?Resp: 19 16   ?Temp: 98.4 ?F (36.9 ?C) 97.9 ?F (36.6 ?C)  ?SpO2: 94% 98%  ? ? ?General: Awake, no distress.  ?CV:  Good peripheral perfusion.  2+ radial pulses ?Resp:  Normal effort.  Clear bilaterally ?Abd:  No distention.  Soft. ?Other:   ?CN II-XII grossly intact ? ?No tenderness/deformities over the C/T/L spine ? ?No focal TTP over b/l shoulders, elbows, wrists, hips, knees, ankles ? ?2+ b/l radial and PD pulses  ? ?No other obvious trauma to face, scalp ,head, neck or torso.  Getting bruising around the bilateral elbows and forearms without any significant tenderness. ? ?No pronator drift or finger dysmetria. ? ? ? ?ED Results / Procedures / Treatments  ?Labs ?(all labs ordered are listed, but only abnormal results are displayed) ?Labs Reviewed  ?COMPREHENSIVE METABOLIC PANEL - Abnormal; Notable for the following components:  ?    Result Value  ? Sodium 132 (*)   ? Glucose, Bld 116 (*)   ? Creatinine, Ser 1.30 (*)   ? GFR, Estimated 49 (*)   ? All other  components within normal limits  ?CBC WITH DIFFERENTIAL/PLATELET - Abnormal; Notable for the following components:  ? WBC 17.0 (*)   ? Platelets 137 (*)   ? Neutro Abs 15.4 (*)   ? Abs Immature Granulocytes 0.09 (*)   ? All other components within normal limits  ?CBG MONITORING, ED - Abnormal; Notable for the following components:  ? Glucose-Capillary 127 (*)   ? All other components within normal limits  ? ? ? ?EKG ? ?ECG shows somewhat wandering baseline with a heart rate of 75, normal axis, unremarkable intervals without clear evidence of acute ischemia or significant arrhythmia. ? ? ?RADIOLOGY ? ?CT head on my interpretation shows no evidence of hemorrhage, ischemia, edema, mass effect, or other clear acute process.  I also reviewed radiologist rotation and agree with their findings of some carotid atherosclerosis. ? ?PROCEDURES: ? ?Critical Care performed: No ? ?.1-3 Lead EKG Interpretation ?Performed by: Lucrezia Starch, MD ?Authorized by: Lucrezia Starch, MD  ? ?   Interpretation: normal   ?  ECG rate assessment: normal   ?  Rhythm: sinus rhythm   ?  Ectopy: none   ?  Conduction: normal   ? ?The patient is on the cardiac monitor to evaluate for evidence of arrhythmia and/or significant heart rate changes. ? ? ?MEDICATIONS ORDERED IN ED: ?Medications - No data to display ? ? ?IMPRESSION / MDM / ASSESSMENT AND PLAN / ED COURSE  ?I reviewed the triage vital signs and the nursing notes. ?             ?               ? ?Differential diagnosis includes, but is not limited to breakthrough seizure in the setting of underlying seizure disorder, benzo withdrawal, electrolyte derangements, intracranial mass with lower suspicion based on history and exam for preceding injury or acute infectious process.  Patient is on stimulants but does take Wellbutrin.  She does appear to have some superficial bruising from her seizure today although has no pain otherwise is completely neurologically intact with very low suspicion for CVA or significant orthopedic or visceral injury.  Lower suspicion for syncope as this was witnessed. ? ? ?ECG shows somewhat wandering baseline with a heart rate of 75, normal axis, unremarkable intervals without clear evidence of acute ischemia or significant arrhythmia. ? ?CT head on my interpretation shows no evidence of hemorrhage, ischemia, edema, mass effect, or other clear acute process.  I also reviewed radiologist rotation and agree with their findings of some carotid atherosclerosis. ? ?CMP is remarkable for a creatinine of 1.3 without any other significant electrolyte or metabolic derangements.  Patient does appear slightly dehydrated.  I offered IV fluids although she states she strongly prefers to orally hydrate and have her kidney function rechecked early next week by her PCP.  I think this is reasonable.  CBC shows nonspecific leukocytosis likely reactive in the setting of seizure without evidence of acute anemia and platelets of 137. ? ?Unclear etiology  for seizure today it is in some time since her last seizure.  She has not had any changes in her lorazepam and is not a daily alcohol drinker.  No acute stimulants per patient.  Discussed patient with on-call neurologist Dr. Amie [place], MD recommended starting patient on Lamictal with up titration plan to close outpatient neurology follow-up.  This was prescribed and also discussed with patient that she will need to work with her prescribing doctor to wean off her Wellbutrin  as this is also not something she should be on especially at higher doses with seizure disorder.  She will follow-up with her PCP to have her kidney function rechecked as well as with neurology.  Discussed extensive seizure precautions including that she should not drive or swim or operate any heavy machinery or do any other test where she could injure herself or others if she becomes acutely capacitated by a seizure.  She voiced understanding and agreement this plan.  Discharged in stable condition.  Strict return precautions advised and discussed. ? ?  ? ? ?FINAL CLINICAL IMPRESSION(S) / ED DIAGNOSES  ? ?Final diagnoses:  ?Seizure ([place])  ?Dehydration  ? ? ? ?Rx / DC Orders  ? ?ED Discharge Orders   ? ?      Ordered  ?  lamoTRIgine (LAMICTAL) 25 MG tablet       ? 10/24/21 2212  ? ?  ?  ? ?  ? ? ? ?Note:  This document was prepared using Dragon voice recognition software and may include unintentional dictation errors. ?  ?Lucrezia Starch, MD ?10/24/21 2244 ? ?

## 2021-10-24 NOTE — ED Triage Notes (Signed)
Pt brought from home via EMS for a witness seizure. Pt has a Hx of seizures. One 3 months ago and the first one 15 years ago. Pt is not on any medications for seizures at this time. Pt is alert and oriented and denies hitting her head or other injuries at this time.  ?

## 2021-10-24 NOTE — ED Notes (Signed)
Pt up to restroom with assistance.

## 2021-10-24 NOTE — ED Notes (Signed)
Patient transported to CT 

## 2021-10-24 NOTE — Progress Notes (Addendum)
ON CALL PHONE CONSULT ? ?Call from [email or web address]. [email or web address]  regional ? ? ?Discussion: Patient coming in with third time seizure.  Not on antiepileptics.  Currently on bupropion, fluoxetine, and BuSpar for psychiatric reasons. Since she has been on those meds for a while, I would not like to stop right away. Consider Bupropion downtitration as outpatient ?Given multiple seizures, I recommend starting AED - Lamotrigine would be best with her history.   ?Has good mood stabilization properties as well as antiepileptic properties.   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 ?Titration schedule as follows. ?Lamotrigine Morning Evening  ?Week 1 25mg    ?Week 2 25mg  25mg   ?Week 3 50mg  25mg   ?Week 4 50mg  50mg   ?Week 5 75mg  50mg   ?Week 6 75mg  75mg   ?Week 7 100mg  75mg   ?Week 8 100mg  100mg   ? ?Follow up with outpatient neurology in 2 weeks. ? ?SEIZURE PRECAUTIONS ?Per [facility] statutes, patients with seizures are not allowed to drive until they have been seizure-free for six months.  ? ?Use caution when using heavy equipment or power tools. Avoid working on ladders or at heights. Take showers instead of baths. Ensure the water temperature is not too high on the home water heater. Do not go swimming alone. Do not lock yourself in a room alone (i.e. bathroom). When caring for infants or small children, sit down when holding, feeding, or changing them to minimize risk of injury to the child in the event you have a seizure. Maintain good sleep hygiene. Avoid alcohol.  ?  ?If patient has another seizure, call 911 and bring them back to the ED if: ?A.  The seizure lasts longer than 5 minutes.      ?B.  The patient doesn't wake shortly after the seizure or has new problems such as difficulty seeing, speaking or moving following the seizure ?C.  The patient was injured during the seizure ?D.  The patient has a temperature over 102 F (39C) ?E.  The patient vomited during the seizure and now is  having trouble breathing ? ?-- ? , MD ?Neurologist ?[facility] ?Pager: [phone] ? ?

## 2021-10-25 ENCOUNTER — Encounter: Payer: Self-pay | Admitting: Nurse Practitioner

## 2021-10-26 ENCOUNTER — Encounter: Payer: Self-pay | Admitting: Nurse Practitioner

## 2021-10-26 ENCOUNTER — Ambulatory Visit (INDEPENDENT_AMBULATORY_CARE_PROVIDER_SITE_OTHER): Payer: 59 | Admitting: Nurse Practitioner

## 2021-10-26 VITALS — BP 104/67 | HR 79 | Temp 97.6°F | Ht 64.0 in | Wt 241.0 lb

## 2021-10-26 DIAGNOSIS — D696 Thrombocytopenia, unspecified: Secondary | ICD-10-CM | POA: Insufficient documentation

## 2021-10-26 DIAGNOSIS — E871 Hypo-osmolality and hyponatremia: Secondary | ICD-10-CM

## 2021-10-26 DIAGNOSIS — F331 Major depressive disorder, recurrent, moderate: Secondary | ICD-10-CM | POA: Diagnosis not present

## 2021-10-26 DIAGNOSIS — N179 Acute kidney failure, unspecified: Secondary | ICD-10-CM | POA: Diagnosis not present

## 2021-10-26 DIAGNOSIS — F419 Anxiety disorder, unspecified: Secondary | ICD-10-CM

## 2021-10-26 DIAGNOSIS — G40909 Epilepsy, unspecified, not intractable, without status epilepticus: Secondary | ICD-10-CM | POA: Diagnosis not present

## 2021-10-26 DIAGNOSIS — D729 Disorder of white blood cells, unspecified: Secondary | ICD-10-CM

## 2021-10-26 NOTE — Assessment & Plan Note (Signed)
Chronic, followed by psychiatry.  Will continue current medication regimen as prescribed by psychiatry and recommend she call them to alert them of seizure disorder as may wish to change regimen.  Would also benefit from therapy, but refuses at this time. ?

## 2021-10-26 NOTE — Assessment & Plan Note (Signed)
Noted on hospital labs, recheck CBC today. ?

## 2021-10-26 NOTE — Assessment & Plan Note (Signed)
Initial in 2009, has never seen neurology -- at this time will place referral for this and highly recommend she reach out to psychiatry to alert them to her seizure disorder as medication changes may need to be made -- both PCP and psychiatry had not been made aware of this history.  Currently has had two seizures in past 4 months, at this time continue Lamictal as ordered.  Discussed with her no driving until she is 6 months seizure free.  Labs today: CBC, CMP, TSH. ?

## 2021-10-26 NOTE — Progress Notes (Signed)
? ?BP 104/67   Pulse 79   Temp 97.6 ?F (36.4 ?C) (Oral)   Ht _0  (1.626 m)   Wt 241 lb (109.3 kg)   LMP  (LMP Unknown)   SpO2 96%   BMI 41.37 kg/m?   ? ?Subjective:  ? ? Patient ID: Tracie James, female    DOB: 08-25-1970, 51 y.o.   MRN: [id number] ? ?HPI: ?Tracie James is a 51 y.o. female ? ?Chief Complaint  ?Patient presents with  ? Seizures  ?  Patient states she was recently hospitalized on Saturday evening around 6pm via ambulance. Patient says she does not remember any warnings or symptoms prior the seizures. Patient states she was discharged Saturday.   ? Generalized Body Aches  ?  Patient states she is hurting all over her body since she had the seizure. Patient declines taking any medication for the pain.   ? ?ER FOLLOW UP ?Recent ER visit on 10/24/21 and diagnosed with seizures.  Had on Saturday night, witnessed by boyfriend.  Has history of these she reports, first one in 2009, then another one after parked moving truck from [place] in 2011, then had another one 4 months ago -- was sitting in chair and started seizing.  No history of head injuries or trauma.  Reports he psychiatry is not aware of this history, it was not in chart on review -- PCP was not aware.  Is on Wellbutrin.  Has not seen neurology, never has seen one.  ? ?No further seizure episodes since that visit.  Is having ongoing body aches, as fell to floor during episodes -- has bruising on elbows.   ? ?No alcohol use, no drug use or smoking.   ?Time since discharge: 10/24/21 ?Hospital/facility: [facility] ?Diagnosis: Seizure disorder ?Procedures/tests: CT of head, CBC (WBC 17, PLT 137, NA+ 132, Glucose 116, CRT 1.30, eGFR 49) -- had diarrhea all that day, no N&V ?Consultants: none ?New medications: Lamictal ?Discharge instructions:  Follow-up with PCP and neurology ?Status: stable  ? ?Relevant past medical, surgical, family and social history reviewed and updated as indicated. Interim medical history since our last visit  reviewed. ?Allergies and medications reviewed and updated. ? ?Review of Systems  ?Constitutional:  Positive for fatigue. Negative for activity change, appetite change, chills, fever and unexpected weight change.  ?Respiratory:  Negative for cough, chest tightness, shortness of breath and wheezing.   ?Cardiovascular:  Negative for chest pain, palpitations and leg swelling.  ?Gastrointestinal: Negative.   ?Musculoskeletal:  Positive for arthralgias.  ?Neurological:  Positive for seizures. Negative for dizziness, tremors, syncope, facial asymmetry, weakness, light-headedness and headaches.  ?Psychiatric/Behavioral: Negative.    ? ?Per HPI unless specifically indicated above ? ?   ?Objective:  ?  ?BP 104/67   Pulse 79   Temp 97.6 ?F (36.4 ?C) (Oral)   Ht _1  (1.626 m)   Wt 241 lb (109.3 kg)   LMP  (LMP Unknown)   SpO2 96%   BMI 41.37 kg/m?   ?Wt Readings from Last 3 Encounters:  ?10/26/21 241 lb (109.3 kg)  ?10/24/21 225 lb (102.1 kg)  ?07/17/21 222 lb 6.4 oz (100.9 kg)  ?  ?Physical Exam ?Vitals and nursing note reviewed.  ?Constitutional:   ?   General: She is awake. She is not in acute distress. ?   Appearance: She is well-developed and well-groomed. She is obese. She is not ill-appearing.  ?HENT:  ?   Head: Normocephalic.  ?   Right Ear: Hearing normal.  ?  Left Ear: Hearing normal.  ?   Nose: Nose normal.  ?Eyes:  ?   General: Lids are normal.     ?   Right eye: No discharge.     ?   Left eye: No discharge.  ?   Conjunctiva/sclera: Conjunctivae normal.  ?   Pupils: Pupils are equal, round, and reactive to light.  ?Neck:  ?   Thyroid: No thyromegaly.  ?   Vascular: No carotid bruit.  ?Cardiovascular:  ?   Rate and Rhythm: Normal rate and regular rhythm.  ?   Heart sounds: Normal heart sounds. No murmur heard. ?  No gallop.  ?Pulmonary:  ?   Effort: Pulmonary effort is normal. No accessory muscle usage or respiratory distress.  ?   Breath sounds: Normal breath sounds.  ?Abdominal:  ?   General: Bowel sounds  are normal.  ?   Palpations: Abdomen is soft.  ?Musculoskeletal:  ?   Cervical back: Normal range of motion and neck supple.  ?   Right lower leg: No edema.  ?   Left lower leg: No edema.  ?Skin: ?   General: Skin is warm and dry.  ?Neurological:  ?   Mental Status: She is alert and oriented to person, place, and time.  ?   Cranial Nerves: Cranial nerves 2-12 are intact.  ?   Motor: Motor function is intact.  ?   Coordination: Coordination is intact.  ?   Gait: Gait is intact.  ?   Deep Tendon Reflexes: Reflexes are normal and symmetric.  ?   Reflex Scores: ?     Brachioradialis reflexes are 2+ on the right side and 2+ on the left side. ?     Patellar reflexes are 2+ on the right side and 2+ on the left side. ?Psychiatric:     ?   Attention and Perception: Attention normal.     ?   Mood and Affect: Mood normal.     ?   Speech: Speech normal.     ?   Behavior: Behavior normal. Behavior is cooperative.     ?   Thought Content: Thought content normal.  ? ? ?Results for orders placed or performed during the hospital encounter of 10/24/21  ?Comprehensive metabolic panel  ?Result Value Ref Range  ? Sodium 132 (L) 135 - 145 mmol/L  ? Potassium 4.5 3.5 - 5.1 mmol/L  ? Chloride 100 98 - 111 mmol/L  ? CO2 22 22 - 32 mmol/L  ? Glucose, Bld 116 (H) 70 - 99 mg/dL  ? BUN 20 6 - 20 mg/dL  ? Creatinine, Ser 1.30 (H) 0.44 - 1.00 mg/dL  ? Calcium 9.3 8.9 - 10.3 mg/dL  ? Total Protein 7.0 6.5 - 8.1 g/dL  ? Albumin 3.7 3.5 - 5.0 g/dL  ? AST 33 15 - 41 U/L  ? ALT 28 0 - 44 U/L  ? Alkaline Phosphatase 76 38 - 126 U/L  ? Total Bilirubin 1.0 0.3 - 1.2 mg/dL  ? GFR, Estimated 49 (L) >60 mL/min  ? Anion gap 10 5 - 15  ?CBC with Differential/Platelet  ?Result Value Ref Range  ? WBC 17.0 (H) 4.0 - 10.5 K/uL  ? RBC 4.53 3.87 - 5.11 MIL/uL  ? Hemoglobin 12.5 12.0 - 15.0 g/dL  ? HCT 38.7 36.0 - 46.0 %  ? MCV 85.4 80.0 - 100.0 fL  ? MCH 27.6 26.0 - 34.0 pg  ? MCHC 32.3 30.0 - 36.0 g/dL  ? RDW  12.8 11.5 - 15.5 %  ? Platelets 137 (L) 150 - 400 K/uL   ? nRBC 0.0 0.0 - 0.2 %  ? Neutrophils Relative % 90 %  ? Neutro Abs 15.4 (H) 1.7 - 7.7 K/uL  ? Lymphocytes Relative 5 %  ? Lymphs Abs 0.8 0.7 - 4.0 K/uL  ? Monocytes Relative 4 %  ? Monocytes Absolute 0.6 0.1 - 1.0 K/uL  ? Eosinophils Relative 0 %  ? Eosinophils Absolute 0.0 0.0 - 0.5 K/uL  ? Basophils Relative 0 %  ? Basophils Absolute 0.0 0.0 - 0.1 K/uL  ? Immature Granulocytes 1 %  ? Abs Immature Granulocytes 0.09 (H) 0.00 - 0.07 K/uL  ?CBG monitoring, ED  ?Result Value Ref Range  ? Glucose-Capillary 127 (H) 70 - 99 mg/dL  ? ?   ?Assessment & Plan:  ? ?Problem List Items Addressed This Visit   ? ?  ? Nervous and Auditory  ? Seizure disorder ([place]) - Primary  ?  Initial in 2009, has never seen neurology -- at this time will place referral for this and highly recommend she reach out to psychiatry to alert them to her seizure disorder as medication changes may need to be made -- both PCP and psychiatry had not been made aware of this history.  Currently has had two seizures in past 4 months, at this time continue Lamictal as ordered.  Discussed with her no driving until she is 6 months seizure free.  Labs today: CBC, CMP, TSH. ?  ?  ? Relevant Orders  ? Ambulatory referral to Neurology  ? CBC with Differential/Platelet  ? Comprehensive metabolic panel  ? TSH  ?  ? Hematopoietic and Hemostatic  ? Thrombocytopenia ([place])  ?  Noted on hospital labs, recheck CBC today. ?  ?  ?  ? Other  ? Anxiety  ?  Chronic, followed by psychiatry.  Will continue current medication regimen as prescribed by psychiatry and recommend she call them to alert them of seizure disorder as may wish to change regimen.  Would also benefit from therapy, but refuses at this time. ?  ?  ? Major depressive disorder, recurrent episode, moderate ([place])  ?  Chronic, followed by psychiatry.  Will continue current medication regimen as prescribed by psychiatry and recommend she call them to alert them of seizure disorder as may wish to change regimen.   Would also benefit from therapy, but refuses at this time. ?  ?  ? ?Other Visit Diagnoses   ? ? AKI (acute kidney injury) ([place])      ? Noted in hospital, has acute diarrhea that day -- recheck CMP today.  ? Abnormal white blood cell ([place]

## 2021-10-26 NOTE — Assessment & Plan Note (Signed)
Chronic, followed by psychiatry.  Will continue current medication regimen as prescribed by psychiatry and recommend she call them to alert them of seizure disorder as may wish to change regimen.  Would also benefit from therapy, but refuses at this time. ?

## 2021-10-26 NOTE — Patient Instructions (Signed)
Seizure, Adult °A seizure is a sudden burst of abnormal electrical and chemical activity in the brain. Seizures usually last from 30 seconds to 2 minutes.  °What are the causes? °Common causes of this condition include: °Fever or infection. °Problems that affect the brain. These may include: °A brain or head injury. °Bleeding in the brain. °A brain tumor. °Low levels of blood sugar or salt. °Kidney problems or liver problems. °Conditions that are passed from parent to child (are inherited). °Problems with a substance, such as: °Having a reaction to a drug or a medicine. °Stopping the use of a substance all of a sudden (withdrawal). °A stroke. °Disorders that affect how you develop. °Sometimes, the cause may not be known.  °What increases the risk? °Having someone in your family who has epilepsy. In this condition, seizures happen again and again over time. They have no clear cause. °Having had a tonic-clonic seizure before. This type of seizure causes you to: °Tighten the muscles of the whole body. °Lose consciousness. °Having had a head injury or strokes before. °Having had a lack of oxygen at birth. °What are the signs or symptoms? °There are many types of seizures. The symptoms vary depending on the type of seizure you have. °Symptoms during a seizure °Shaking that you cannot control (convulsions) with fast, jerky movements of muscles. °Stiffness of the body. °Breathing problems. °Feeling mixed up (confused). °Staring or not responding to sound or touch. °Head nodding. °Eyes that blink, flutter, or move fast. °Drooling, grunting, or making clicking sounds with your mouth °Losing control of when you pee or poop. °Symptoms before a seizure °Feeling afraid, nervous, or worried. °Feeling like you may vomit. °Feeling like: °You are moving when you are not. °Things around you are moving when they are not. °Feeling like you saw or heard something before (déjà vu). °Odd tastes or smells. °Changes in how you see. You may  see flashing lights or spots. °Symptoms after a seizure °Feeling confused. °Feeling sleepy. °Headache. °Sore muscles. °How is this treated? °If your seizure stops on its own, you will not need treatment. If your seizure lasts longer than 5 minutes, you will normally need treatment. Treatment may include: °Medicines given through an IV tube. °Avoiding things, such as medicines, that are known to cause your seizures. °Medicines to prevent seizures. °A device to prevent or control seizures. °Surgery. °A diet low in carbohydrates and high in fat (ketogenic diet). °Follow these instructions at home: °Medicines °Take over-the-counter and prescription medicines only as told by your doctor. °Avoid foods or drinks that may keep your medicine from working, such as alcohol. °Activity °Follow instructions about driving, swimming, or doing things that would be dangerous if you had another seizure. Wait until your doctor says it is safe for you to do these things. °If you live in the [place] ask your local department of motor vehicles when you can drive. °Get a lot of rest. °Teaching others ° °Teach friends and family what to do when you have a seizure. They should: °Help you get down to the ground. °Protect your head and body. °Loosen any clothing around your neck. °Turn you on your side. °Know whether or not you need emergency care. °Stay with you until you are better. °Also, tell them what not to do if you have a seizure. Tell them: °They should not hold you down. °They should not put anything in your mouth. °General instructions °Avoid anything that gives you seizures. °Keep a seizure diary. Write down: °What you remember   about each seizure. °What you think caused each seizure. °Keep all follow-up visits. °Contact a doctor if: °You have another seizure or seizures. Call the doctor each time you have a seizure. °The pattern of your seizures changes. °You keep having seizures with treatment. °You have symptoms of being sick or  having an infection. °You are not able to take your medicine. °Get help right away if: °You have any of these problems: °A seizure that lasts longer than 5 minutes. °Many seizures in a row and you do not feel better between seizures. °A seizure that makes it harder to breathe. °A seizure and you can no longer speak or use part of your body. °You do not wake up right after a seizure. °You get hurt during a seizure. °You feel confused or have pain right after a seizure. °These symptoms may be an emergency. Get help right away. Call your local emergency services (911 in the [place] °Do not wait to see if the symptoms will go away. °Do not drive yourself to the hospital. °Summary °A seizure is a sudden burst of abnormal electrical and chemical activity in the brain. Seizures normally last from 30 seconds to 2 minutes. °Causes of seizures include illness, injury to the head, low levels of blood sugar or salt, and certain conditions. °Most seizures will stop on their own in less than 5 minutes. Seizures that last longer than 5 minutes are a medical emergency and need treatment right away. °Many medicines are used to treat seizures. Take over-the-counter and prescription medicines only as told by your doctor. °This information is not intended to replace advice given to you by your health care provider. Make sure you discuss any questions you have with your health care provider. °Document Revised: 01/11/2020 Document Reviewed: 01/11/2020 °Elsevier Patient Education © 2022 [employer] ° °

## 2021-10-27 LAB — CBC WITH DIFFERENTIAL/PLATELET
Basophils Absolute: 0.1 10*3/uL (ref 0.0–0.2)
Basos: 1 %
EOS (ABSOLUTE): 0.4 10*3/uL (ref 0.0–0.4)
Eos: 5 %
Hematocrit: 34.5 % (ref 34.0–46.6)
Hemoglobin: 11.7 g/dL (ref 11.1–15.9)
Immature Grans (Abs): 0 10*3/uL (ref 0.0–0.1)
Immature Granulocytes: 1 %
Lymphocytes Absolute: 1.9 10*3/uL (ref 0.7–3.1)
Lymphs: 25 %
MCH: 28.1 pg (ref 26.6–33.0)
MCHC: 33.9 g/dL (ref 31.5–35.7)
MCV: 83 fL (ref 79–97)
Monocytes Absolute: 0.5 10*3/uL (ref 0.1–0.9)
Monocytes: 6 %
Neutrophils Absolute: 4.8 10*3/uL (ref 1.4–7.0)
Neutrophils: 62 %
Platelets: 278 10*3/uL (ref 150–450)
RBC: 4.16 x10E6/uL (ref 3.77–5.28)
RDW: 13 % (ref 11.7–15.4)
WBC: 7.6 10*3/uL (ref 3.4–10.8)

## 2021-10-27 LAB — COMPREHENSIVE METABOLIC PANEL
ALT: 31 IU/L (ref 0–32)
AST: 31 IU/L (ref 0–40)
Albumin/Globulin Ratio: 2.2 (ref 1.2–2.2)
Albumin: 4.1 g/dL (ref 3.8–4.9)
Alkaline Phosphatase: 94 IU/L (ref 44–121)
BUN/Creatinine Ratio: 16 (ref 9–23)
BUN: 16 mg/dL (ref 6–24)
Bilirubin Total: <0.2 mg/dL (ref 0.0–1.2)
CO2: 21 mmol/L (ref 20–29)
Calcium: 8.6 mg/dL — ABNORMAL LOW (ref 8.7–10.2)
Chloride: 105 mmol/L (ref 96–106)
Creatinine, Ser: 1.03 mg/dL — ABNORMAL HIGH (ref 0.57–1.00)
Globulin, Total: 1.9 g/dL (ref 1.5–4.5)
Glucose: 73 mg/dL (ref 70–99)
Potassium: 3.9 mmol/L (ref 3.5–5.2)
Sodium: 139 mmol/L (ref 134–144)
Total Protein: 6 g/dL (ref 6.0–8.5)
eGFR: 65 mL/min/{1.73_m2} (ref >59)

## 2021-10-27 LAB — TSH: TSH: 1.72 u[IU]/mL (ref 0.450–4.500)

## 2021-10-27 NOTE — Progress Notes (Signed)
Contacted via MyChart ? ? ?Good evening Tracie James, your labs have returned and kidney function is much improved from hospital.  Back into normal range.  CBC shows no anemia or infection, white blood cell count now normal.  Thyroid testing normal.  Please ensure to schedule with neurology.  If any questions let me know. ?Keep being amazing!!  Thank you for allowing me to participate in your care.  I appreciate you. ?Kindest regards, ?Jevante Hollibaugh ?

## 2021-11-14 NOTE — Patient Instructions (Signed)
Seizure, Adult A seizure is a sudden burst of abnormal electrical and chemical activity in the brain. Seizures usually last from 30 seconds to 2 minutes.  What are the causes? Common causes of this condition include: Fever or infection. Problems that affect the brain. These may include: A brain or head injury. Bleeding in the brain. A brain tumor. Low levels of blood sugar or salt. Kidney problems or liver problems. Conditions that are passed from parent to child (are inherited). Problems with a substance, such as: Having a reaction to a drug or a medicine. Stopping the use of a substance all of a sudden (withdrawal). A stroke. Disorders that affect how you develop. Sometimes, the cause may not be known.  What increases the risk? Having someone in your family who has epilepsy. In this condition, seizures happen again and again over time. They have no clear cause. Having had a tonic-clonic seizure before. This type of seizure causes you to: Tighten the muscles of the whole body. Lose consciousness. Having had a head injury or strokes before. Having had a lack of oxygen at birth. What are the signs or symptoms? There are many types of seizures. The symptoms vary depending on the type of seizure you have. Symptoms during a seizure Shaking that you cannot control (convulsions) with fast, jerky movements of muscles. Stiffness of the body. Breathing problems. Feeling mixed up (confused). Staring or not responding to sound or touch. Head nodding. Eyes that blink, flutter, or move fast. Drooling, grunting, or making clicking sounds with your mouth Losing control of when you pee or poop. Symptoms before a seizure Feeling afraid, nervous, or worried. Feeling like you may vomit. Feeling like: You are moving when you are not. Things around you are moving when they are not. Feeling like you saw or heard something before (dj vu). Odd tastes or smells. Changes in how you see. You may  see flashing lights or spots. Symptoms after a seizure Feeling confused. Feeling sleepy. Headache. Sore muscles. How is this treated? If your seizure stops on its own, you will not need treatment. If your seizure lasts longer than 5 minutes, you will normally need treatment. Treatment may include: Medicines given through an IV tube. Avoiding things, such as medicines, that are known to cause your seizures. Medicines to prevent seizures. A device to prevent or control seizures. Surgery. A diet low in carbohydrates and high in fat (ketogenic diet). Follow these instructions at home: Medicines Take over-the-counter and prescription medicines only as told by your doctor. Avoid foods or drinks that may keep your medicine from working, such as alcohol. Activity Follow instructions about driving, swimming, or doing things that would be dangerous if you had another seizure. Wait until your doctor says it is safe for you to do these things. If you live in the [place] ask your local department of motor vehicles when you can drive. Get a lot of rest. Teaching others  Teach friends and family what to do when you have a seizure. They should: Help you get down to the ground. Protect your head and body. Loosen any clothing around your neck. Turn you on your side. Know whether or not you need emergency care. Stay with you until you are better. Also, tell them what not to do if you have a seizure. Tell them: They should not hold you down. They should not put anything in your mouth. General instructions Avoid anything that gives you seizures. Keep a seizure diary. Write down: What you remember   about each seizure. What you think caused each seizure. Keep all follow-up visits. Contact a doctor if: You have another seizure or seizures. Call the doctor each time you have a seizure. The pattern of your seizures changes. You keep having seizures with treatment. You have symptoms of being sick or  having an infection. You are not able to take your medicine. Get help right away if: You have any of these problems: A seizure that lasts longer than 5 minutes. Many seizures in a row and you do not feel better between seizures. A seizure that makes it harder to breathe. A seizure and you can no longer speak or use part of your body. You do not wake up right after a seizure. You get hurt during a seizure. You feel confused or have pain right after a seizure. These symptoms may be an emergency. Get help right away. Call your local emergency services (911 in the [place] Do not wait to see if the symptoms will go away. Do not drive yourself to the hospital. Summary A seizure is a sudden burst of abnormal electrical and chemical activity in the brain. Seizures normally last from 30 seconds to 2 minutes. Causes of seizures include illness, injury to the head, low levels of blood sugar or salt, and certain conditions. Most seizures will stop on their own in less than 5 minutes. Seizures that last longer than 5 minutes are a medical emergency and need treatment right away. Many medicines are used to treat seizures. Take over-the-counter and prescription medicines only as told by your doctor. This information is not intended to replace advice given to you by your health care provider. Make sure you discuss any questions you have with your health care provider. Document Revised: 01/11/2020 Document Reviewed: 01/11/2020 Elsevier Patient Education  2023 Elsevier Inc.  

## 2021-11-16 ENCOUNTER — Ambulatory Visit (INDEPENDENT_AMBULATORY_CARE_PROVIDER_SITE_OTHER): Payer: 59 | Admitting: Nurse Practitioner

## 2021-11-16 ENCOUNTER — Encounter: Payer: Self-pay | Admitting: Nurse Practitioner

## 2021-11-16 VITALS — BP 109/70 | HR 73 | Temp 97.8°F | Ht 64.0 in | Wt 241.4 lb

## 2021-11-16 DIAGNOSIS — G40909 Epilepsy, unspecified, not intractable, without status epilepticus: Secondary | ICD-10-CM

## 2021-11-16 DIAGNOSIS — Z6841 Body Mass Index (BMI) 40.0 and over, adult: Secondary | ICD-10-CM

## 2021-11-16 MED ORDER — LAMOTRIGINE 25 MG PO TABS: ORAL_TABLET | ORAL | 0 refills | Status: DC

## 2021-11-16 NOTE — Assessment & Plan Note (Signed)
Initial in 2009, has never seen neurology -- has referral to neurology and will check on this + highly recommend she reach out to psychiatry to alert them to her seizure disorder as medication changes may need to be made -- both PCP and psychiatry had not been made aware of this history.  Currently has had two seizures in past 4 months, at this time continue Lamictal as ordered.  Discussed with her no driving until she is 6 months seizure free.  Labs up to date.  Return in 6 weeks. ?

## 2021-11-16 NOTE — Assessment & Plan Note (Signed)
BMI 41.44.  Recommended eating smaller high protein, low fat meals more frequently and exercising 30 mins a day 5 times a week with a goal of 10-15lb weight loss in the next 3 months. Patient voiced their understanding and motivation to adhere to these recommendations. ? ?

## 2021-11-16 NOTE — Progress Notes (Signed)
? ?BP 109/70   Pulse 73   Temp 97.8 ?F (36.6 ?C) (Oral)   Ht 5' 4" (1.626 m)   Wt 241 lb 6.4 oz (109.5 kg)   LMP  (LMP Unknown)   SpO2 97%   BMI 41.44 kg/m?   ? ?Subjective:  ? ? Patient ID: Tracie James, female    DOB: [birth year], 51 y.o.   MRN: [id number] ? ?HPI: ?Tracie James is a 51 y.o. female ? ?Chief Complaint  ?Patient presents with  ? Seizures  ?  Patient is here to follow up on Seizure. Patient denies having any concerns at today's visit. Patient states she has noticed a weight gain since started her last medication.   ? ?SEIZURE DISORDER: ?Follow-up today for seizure activity.  Seen in ER on 10/24/21 and diagnosed with seizures.  Currently taking Lamictal 50 MG in morning and 50 MG in the evening.  No seizures since recent visit with PCP.  Has history of these she reports, first one in 2009, then another one after parked moving truck from [place] in 2011, then had another one 5-6 months ago -- was sitting in chair and started seizing. Have been witnessed by her boyfriend and other people.  No history of head injuries or trauma.   ? ?Reports her psychiatrist is not aware of this history, it was not in chart on review -- PCP was not aware.  Is on Wellbutrin - she will discuss this with psychiatry at her upcoming visit in one week.  Has not seen neurology, never has seen one.  ? ?No alcohol use, no drug use or smoking.   ? ?Relevant past medical, surgical, family and social history reviewed and updated as indicated. Interim medical history since our last visit reviewed. ?Allergies and medications reviewed and updated. ? ?Review of Systems  ?Constitutional:  Negative for activity change, appetite change, chills, fatigue, fever and unexpected weight change.  ?Respiratory:  Negative for cough, chest tightness, shortness of breath and wheezing.   ?Cardiovascular:  Negative for chest pain, palpitations and leg swelling.  ?Gastrointestinal: Negative.   ?Neurological:  Positive for seizures. Negative for  dizziness, tremors, syncope, facial asymmetry, weakness, light-headedness and headaches.  ?Psychiatric/Behavioral: Negative.    ? ?Per HPI unless specifically indicated above ? ?   ?Objective:  ?  ?BP 109/70   Pulse 73   Temp 97.8 ?F (36.6 ?C) (Oral)   Ht 5' 4" (1.626 m)   Wt 241 lb 6.4 oz (109.5 kg)   LMP  (LMP Unknown)   SpO2 97%   BMI 41.44 kg/m?   ?Wt Readings from Last 3 Encounters:  ?11/16/21 241 lb 6.4 oz (109.5 kg)  ?10/26/21 241 lb (109.3 kg)  ?10/24/21 225 lb (102.1 kg)  ?  ?Physical Exam ?Vitals and nursing note reviewed.  ?Constitutional:   ?   General: She is awake. She is not in acute distress. ?   Appearance: She is well-developed and well-groomed. She is obese. She is not ill-appearing.  ?HENT:  ?   Head: Normocephalic.  ?   Right Ear: Hearing normal.  ?   Left Ear: Hearing normal.  ?   Nose: Nose normal.  ?Eyes:  ?   General: Lids are normal.     ?   Right eye: No discharge.     ?   Left eye: No discharge.  ?   Conjunctiva/sclera: Conjunctivae normal.  ?   Pupils: Pupils are equal, round, and reactive to light.  ?Neck:  ?  Thyroid: No thyromegaly.  ?   Vascular: No carotid bruit.  ?Cardiovascular:  ?   Rate and Rhythm: Normal rate and regular rhythm.  ?   Heart sounds: Normal heart sounds. No murmur heard. ?  No gallop.  ?Pulmonary:  ?   Effort: Pulmonary effort is normal. No accessory muscle usage or respiratory distress.  ?   Breath sounds: Normal breath sounds.  ?Abdominal:  ?   General: Bowel sounds are normal.  ?   Palpations: Abdomen is soft.  ?Musculoskeletal:  ?   Cervical back: Normal range of motion and neck supple.  ?   Right lower leg: No edema.  ?   Left lower leg: No edema.  ?Skin: ?   General: Skin is warm and dry.  ?Neurological:  ?   Mental Status: She is alert and oriented to person, place, and time.  ?   Cranial Nerves: Cranial nerves 2-12 are intact.  ?   Motor: Motor function is intact.  ?   Coordination: Coordination is intact.  ?   Gait: Gait is intact.  ?   Deep  Tendon Reflexes: Reflexes are normal and symmetric.  ?   Reflex Scores: ?     Brachioradialis reflexes are 2+ on the right side and 2+ on the left side. ?     Patellar reflexes are 2+ on the right side and 2+ on the left side. ?Psychiatric:     ?   Attention and Perception: Attention normal.     ?   Mood and Affect: Mood normal.     ?   Speech: Speech normal.     ?   Behavior: Behavior normal. Behavior is cooperative.     ?   Thought Content: Thought content normal.  ? ?Results for orders placed or performed in visit on 10/26/21  ?CBC with Differential/Platelet  ?Result Value Ref Range  ? WBC 7.6 3.4 - 10.8 x10E3/uL  ? RBC 4.16 3.77 - 5.28 x10E6/uL  ? Hemoglobin 11.7 11.1 - 15.9 g/dL  ? Hematocrit 34.5 34.0 - 46.6 %  ? MCV 83 79 - 97 fL  ? MCH 28.1 26.6 - 33.0 pg  ? MCHC 33.9 31.5 - 35.7 g/dL  ? RDW 13.0 11.7 - 15.4 %  ? Platelets 278 150 - 450 x10E3/uL  ? Neutrophils 62 Not Estab. %  ? Lymphs 25 Not Estab. %  ? Monocytes 6 Not Estab. %  ? Eos 5 Not Estab. %  ? Basos 1 Not Estab. %  ? Neutrophils Absolute 4.8 1.4 - 7.0 x10E3/uL  ? Lymphocytes Absolute 1.9 0.7 - 3.1 x10E3/uL  ? Monocytes Absolute 0.5 0.1 - 0.9 x10E3/uL  ? EOS (ABSOLUTE) 0.4 0.0 - 0.4 x10E3/uL  ? Basophils Absolute 0.1 0.0 - 0.2 x10E3/uL  ? Immature Granulocytes 1 Not Estab. %  ? Immature Grans (Abs) 0.0 0.0 - 0.1 x10E3/uL  ?Comprehensive metabolic panel  ?Result Value Ref Range  ? Glucose 73 70 - 99 mg/dL  ? BUN 16 6 - 24 mg/dL  ? Creatinine, Ser 1.03 (H) 0.57 - 1.00 mg/dL  ? eGFR 65 >59 mL/min/1.73  ? BUN/Creatinine Ratio 16 9 - 23  ? Sodium 139 134 - 144 mmol/L  ? Potassium 3.9 3.5 - 5.2 mmol/L  ? Chloride 105 96 - 106 mmol/L  ? CO2 21 20 - 29 mmol/L  ? Calcium 8.6 (L) 8.7 - 10.2 mg/dL  ? Total Protein 6.0 6.0 - 8.5 g/dL  ? Albumin 4.1 3.8 - 4.9 g/dL  ?  Globulin, Total 1.9 1.5 - 4.5 g/dL  ? Albumin/Globulin Ratio 2.2 1.2 - 2.2  ? Bilirubin Total <0.2 0.0 - 1.2 mg/dL  ? Alkaline Phosphatase 94 44 - 121 IU/L  ? AST 31 0 - 40 IU/L  ? ALT 31 0 - 32  IU/L  ?TSH  ?Result Value Ref Range  ? TSH 1.720 0.450 - 4.500 uIU/mL  ? ?   ?Assessment & Plan:  ? ?Problem List Items Addressed This Visit   ? ?  ? Nervous and Auditory  ? Seizure disorder ([place]) - Primary  ?  Initial in 2009, has never seen neurology -- has referral to neurology and will check on this + highly recommend she reach out to psychiatry to alert them to her seizure disorder as medication changes may need to be made -- both PCP and psychiatry had not been made aware of this history.  Currently has had two seizures in past 4 months, at this time continue Lamictal as ordered.  Discussed with her no driving until she is 6 months seizure free.  Labs up to date.  Return in 6 weeks. ? ?  ?  ? Relevant Medications  ? lamoTRIgine (LAMICTAL) 25 MG tablet  ?  ? Other  ? Obesity  ?  BMI 41.44.  Recommended eating smaller high protein, low fat meals more frequently and exercising 30 mins a day 5 times a week with a goal of 10-15lb weight loss in the next 3 months. Patient voiced their understanding and motivation to adhere to these recommendations. ? ? ?  ?  ?  ? ?Follow up plan: ?Return in about 6 weeks (around 12/28/2021) for SEIZURE DISORDER. ? ? ? ? ? ?

## 2021-11-18 ENCOUNTER — Encounter: Payer: Self-pay | Admitting: Nurse Practitioner
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
## 2021-11-24 ENCOUNTER — Encounter: Payer: Self-pay | Admitting: Psychiatry

## 2021-11-24 ENCOUNTER — Telehealth (INDEPENDENT_AMBULATORY_CARE_PROVIDER_SITE_OTHER): Payer: 59 | Admitting: Psychiatry

## 2021-11-24 DIAGNOSIS — F419 Anxiety disorder, unspecified: Secondary | ICD-10-CM | POA: Diagnosis not present

## 2021-11-24 DIAGNOSIS — F330 Major depressive disorder, recurrent, mild: Secondary | ICD-10-CM | POA: Diagnosis not present

## 2021-11-24 MED ORDER — BUSPIRONE HCL 5 MG PO TABS: 5.0000 mg | ORAL_TABLET | Freq: Two times a day (BID) | ORAL | 0 refills | Status: DC

## 2021-11-24 MED ORDER — FLUOXETINE HCL 40 MG PO CAPS: 80.0000 mg | ORAL_CAPSULE | Freq: Every day | ORAL | 1 refills | Status: DC

## 2021-11-24 NOTE — Addendum Note (Signed)
Addended by: [name] on: 11/24/2021 04:36 PM ? ? Modules accepted: Orders, Level of Service ? ?

## 2021-11-24 NOTE — Patient Instructions (Addendum)
Continue fluoxetine 80 mg daily  ?Continue Bupropion 450 mg daily ?Continue Buspar 5 mg twice day  ?Continue lorazepam 1 mg as needed for anxiety  ?Next appointment: 7/11 at 3 PM, in person ? ?The next visit will be in person visit. Please arrive 15 mins before the scheduled time.  ? ?[facility]  ?Address: [address], [place], [place] [address]   ?

## 2021-12-09 ENCOUNTER — Other Ambulatory Visit: Payer: Self-pay | Admitting: Nurse Practitioner

## 2021-12-10 NOTE — Telephone Encounter (Signed)
Requested medication (s) are due for refill today: yes  Requested medication (s) are on the active medication list: yes    Last refill: 10/07/21  #30  1 refill  Future visit scheduled yes 01/05/22  Notes to clinic:Not delegated, please review  Requested Prescriptions  Pending Prescriptions Disp Refills   cyclobenzaprine (FLEXERIL) 10 MG tablet [Pharmacy Med Name: CYCLOBENZAPRINE 10 MG TABLET] 30 tablet 1    Sig: TAKE 1 TABLET BY MOUTH AT BEDTIME AS NEEDED FOR MUSCLE SPASMS     Not Delegated - Analgesics:  Muscle Relaxants Failed - 12/09/2021 11:46 AM      Failed - This refill cannot be delegated      Passed - Valid encounter within last 6 months    Recent Outpatient Visits           3 weeks ago Seizure disorder (HCC)   [facility] Cannady, Dorie Rank, NP   1 month ago Seizure disorder (HCC)   [facility] Cannady, Dorie Rank, NP   4 months ago Fibromyalgia   [facility] [place], [place] T, NP   4 months ago Palpitations   [facility], [place], DO   11 months ago Palpitations   [facility] [place], Jake Church, NP       Future Appointments             In 3 weeks Cannady, Dorie Rank, NP [employer], [facility]

## 2021-12-30 ENCOUNTER — Ambulatory Visit: Payer: 59 | Attending: Neurology

## 2021-12-30 DIAGNOSIS — R569 Unspecified convulsions: Secondary | ICD-10-CM | POA: Insufficient documentation

## 2021-12-30 DIAGNOSIS — G4733 Obstructive sleep apnea (adult) (pediatric): Secondary | ICD-10-CM | POA: Insufficient documentation

## 2021-12-30 DIAGNOSIS — G4761 Periodic limb movement disorder: Secondary | ICD-10-CM | POA: Diagnosis not present

## 2022-01-02 NOTE — Patient Instructions (Signed)
Seizure, Adult A seizure is a sudden burst of abnormal electrical and chemical activity in the brain. Seizures usually last from 30 seconds to 2 minutes.  What are the causes? Common causes of this condition include: Fever or infection. Problems that affect the brain. These may include: A brain or head injury. Bleeding in the brain. A brain tumor. Low levels of blood sugar or salt. Kidney problems or liver problems. Conditions that are passed from parent to child (are inherited). Problems with a substance, such as: Having a reaction to a drug or a medicine. Stopping the use of a substance all of a sudden (withdrawal). A stroke. Disorders that affect how you develop. Sometimes, the cause may not be known.  What increases the risk? Having someone in your family who has epilepsy. In this condition, seizures happen again and again over time. They have no clear cause. Having had a tonic-clonic seizure before. This type of seizure causes you to: Tighten the muscles of the whole body. Lose consciousness. Having had a head injury or strokes before. Having had a lack of oxygen at birth. What are the signs or symptoms? There are many types of seizures. The symptoms vary depending on the type of seizure you have. Symptoms during a seizure Shaking that you cannot control (convulsions) with fast, jerky movements of muscles. Stiffness of the body. Breathing problems. Feeling mixed up (confused). Staring or not responding to sound or touch. Head nodding. Eyes that blink, flutter, or move fast. Drooling, grunting, or making clicking sounds with your mouth Losing control of when you pee or poop. Symptoms before a seizure Feeling afraid, nervous, or worried. Feeling like you may vomit. Feeling like: You are moving when you are not. Things around you are moving when they are not. Feeling like you saw or heard something before (dj vu). Odd tastes or smells. Changes in how you see. You may  see flashing lights or spots. Symptoms after a seizure Feeling confused. Feeling sleepy. Headache. Sore muscles. How is this treated? If your seizure stops on its own, you will not need treatment. If your seizure lasts longer than 5 minutes, you will normally need treatment. Treatment may include: Medicines given through an IV tube. Avoiding things, such as medicines, that are known to cause your seizures. Medicines to prevent seizures. A device to prevent or control seizures. Surgery. A diet low in carbohydrates and high in fat (ketogenic diet). Follow these instructions at home: Medicines Take over-the-counter and prescription medicines only as told by your doctor. Avoid foods or drinks that may keep your medicine from working, such as alcohol. Activity Follow instructions about driving, swimming, or doing things that would be dangerous if you had another seizure. Wait until your doctor says it is safe for you to do these things. If you live in the [place] ask your local department of motor vehicles when you can drive. Get a lot of rest. Teaching others  Teach friends and family what to do when you have a seizure. They should: Help you get down to the ground. Protect your head and body. Loosen any clothing around your neck. Turn you on your side. Know whether or not you need emergency care. Stay with you until you are better. Also, tell them what not to do if you have a seizure. Tell them: They should not hold you down. They should not put anything in your mouth. General instructions Avoid anything that gives you seizures. Keep a seizure diary. Write down: What you remember   about each seizure. What you think caused each seizure. Keep all follow-up visits. Contact a doctor if: You have another seizure or seizures. Call the doctor each time you have a seizure. The pattern of your seizures changes. You keep having seizures with treatment. You have symptoms of being sick or  having an infection. You are not able to take your medicine. Get help right away if: You have any of these problems: A seizure that lasts longer than 5 minutes. Many seizures in a row and you do not feel better between seizures. A seizure that makes it harder to breathe. A seizure and you can no longer speak or use part of your body. You do not wake up right after a seizure. You get hurt during a seizure. You feel confused or have pain right after a seizure. These symptoms may be an emergency. Get help right away. Call your local emergency services (911 in the [place] Do not wait to see if the symptoms will go away. Do not drive yourself to the hospital. Summary A seizure is a sudden burst of abnormal electrical and chemical activity in the brain. Seizures normally last from 30 seconds to 2 minutes. Causes of seizures include illness, injury to the head, low levels of blood sugar or salt, and certain conditions. Most seizures will stop on their own in less than 5 minutes. Seizures that last longer than 5 minutes are a medical emergency and need treatment right away. Many medicines are used to treat seizures. Take over-the-counter and prescription medicines only as told by your doctor. This information is not intended to replace advice given to you by your health care provider. Make sure you discuss any questions you have with your health care provider. Document Revised: 01/11/2020 Document Reviewed: 01/11/2020 Elsevier Patient Education  2023 Elsevier Inc.  

## 2022-01-05 ENCOUNTER — Encounter: Payer: Self-pay | Admitting: Nurse Practitioner

## 2022-01-05 ENCOUNTER — Ambulatory Visit (INDEPENDENT_AMBULATORY_CARE_PROVIDER_SITE_OTHER): Payer: 59 | Admitting: Nurse Practitioner

## 2022-01-05 VITALS — BP 105/69 | HR 73 | Temp 98.1°F | Ht 64.0 in | Wt 241.0 lb

## 2022-01-05 DIAGNOSIS — Z6841 Body Mass Index (BMI) 40.0 and over, adult: Secondary | ICD-10-CM | POA: Diagnosis not present

## 2022-01-05 DIAGNOSIS — G40909 Epilepsy, unspecified, not intractable, without status epilepticus: Secondary | ICD-10-CM

## 2022-01-05 MED ORDER — METOPROLOL SUCCINATE ER 50 MG PO TB24
ORAL_TABLET | ORAL | 4 refills | Status: DC
Start: 2022-01-05 — End: 2022-01-06

## 2022-01-05 MED ORDER — SAXENDA 18 MG/3ML ~~LOC~~ SOPN: PEN_INJECTOR | SUBCUTANEOUS | 0 refills | Status: AC

## 2022-01-05 NOTE — Assessment & Plan Note (Signed)
Initial in 2009 with current return of seizures.  Currently on Lamictal, will continue this prescription per neurology.  Continue collaboration with neurology.  Discussed with her no driving until she is 6 months seizure free.  Labs up to date.  Return in 6 weeks.
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
## 2022-01-05 NOTE — Assessment & Plan Note (Signed)
BMI 41.37.  Will work on [employer] and plan to switch over to [facility] if no benefit from this in upcoming 6 months, currently Reginal Lutes is on Scientist, clinical (histocompatibility and immunogenetics).  No family history of thyroid disease or personal history of pancreatitis.  Has been trying for 6 months with diet and exercise to lose weight, but with addition of Lamictal is now gaining.   Recommended eating smaller high protein, low fat meals more frequently and exercising 30 mins a day 5 times a week with a goal of 10-15lb weight loss in the next 3 months. Patient voiced their understanding and motivation to adhere to these recommendations.

## 2022-01-06 ENCOUNTER — Other Ambulatory Visit: Payer: Self-pay | Admitting: Nurse Practitioner

## 2022-01-06 MED ORDER — METOPROLOL SUCCINATE ER 50 MG PO TB24
50.0000 mg | ORAL_TABLET | Freq: Every day | ORAL | 4 refills | Status: DC
Start: 2022-01-06 — End: 2022-06-28

## 2022-01-10 ENCOUNTER — Other Ambulatory Visit: Payer: Self-pay | Admitting: Nurse Practitioner

## 2022-01-15 ENCOUNTER — Other Ambulatory Visit: Payer: Self-pay | Admitting: Podiatry

## 2022-01-21 NOTE — Progress Notes (Signed)
Virtual Visit via Video Note  I connected with Tracie James on 01/26/22 at 10:30 AM EDT by a video enabled telemedicine application and verified that I am speaking with the correct person using two identifiers.  Location: Patient: work Provider: office Persons participated in the visit- patient, provider    I discussed the limitations of evaluation and management by telemedicine and the availability of in person appointments. The patient expressed understanding and agreed to proceed.     I discussed the assessment and treatment plan with the patient. The patient was provided an opportunity to ask questions and all were answered. The patient agreed with the plan and demonstrated an understanding of the instructions.   The patient was advised to call back or seek an in-person evaluation if the symptoms worsen or if the condition fails to improve as anticipated.  I provided 16 minutes of non-face-to-face time during this encounter.   Tracie Hotter, MD    [facility] MD/PA/NP OP Progress Note  01/26/2022 11:05 AM Tracie James  MRN:  [id number]  Chief Complaint:  Chief Complaint  Patient presents with   Follow-up   Depression   Anxiety   HPI:  This is a follow-up appointment for depression and anxiety.  She states that she is doing fine since the last visit.  She is still unable to see her daughter.  Her father has been able to see her daughter, and have her that she has been doing well.  Her ex husband does not respond when she asked to see her.  Her oldest daughter is moving back to her place after break-up.  She reports good relationship with her.  She enjoys the time with her boyfriend.  She does better when she is busy.  She does cleaning at home.  She tends to think about her daughter when she is at home.  She constantly feels anxious and tense.  She has increase in appetite since being on lamotrigine.  She is fighting with the urge to eat.  Her PCP is considering starting Saxenda.   She denies feeling depressed.  She feels fatigued at times.  She denies SI.  She denies alcohol use or drug use.  She was seen by neurologist and was told the EEG and prolonged EEG were normal.  She was started on Abilify, although she was unsure of its indication.    Employment: Designer, industrial/product Support: boyfriend of ten years Household: boyfriend of 11 years in 2022 Marital status: divorced Number of children: 2 daughters, oldest is 81 year old, will move to [place]  246 lbs Wt Readings from Last 3 Encounters:  01/05/22 241 lb (109.3 kg)  11/16/21 241 lb 6.4 oz (109.5 kg)  10/26/21 241 lb (109.3 kg)            Visit Diagnosis:    ICD-10-CM   1. MDD (major depressive disorder), recurrent episode, mild (HCC)  F33.0 Ambulatory referral to Psychology    2. Anxiety  F41.9       Past Psychiatric History: Please see initial evaluation for full details. I have reviewed the history. No updates at this time.     Past Medical History:  Past Medical History:  Diagnosis Date   Anxiety    Asthma    Bipolar 2 disorder (HCC)    Depression    GERD (gastroesophageal reflux disease)    Palpitations    PVC (premature ventricular contraction)     Past Surgical History:  Procedure Laterality Date   ABDOMINAL HYSTERECTOMY  patient is not sure if it was total or complete   APPENDECTOMY      Family Psychiatric History: Please see initial evaluation for full details. I have reviewed the history. No updates at this time.     Family History:  Family History  Problem Relation Age of Onset   Depression Mother    Multiple sclerosis Mother    Schizophrenia Maternal Grandmother    Heart disease Maternal Grandmother        aortic valve replacement    Social History:  Social History   Socioeconomic History   Marital status: Divorced    Spouse name: Not on file   Number of children: Not on file   Years of education: Not on file   Highest education level: Not on file  Occupational History    Not on file  Tobacco Use   Smoking status: Former    Types: Cigarettes    Quit date: 03/31/2014    Years since quitting: 7.8   Smokeless tobacco: Never  Vaping Use   Vaping Use: Never used  Substance and Sexual Activity   Alcohol use: No   Drug use: No   Sexual activity: Yes    Partners: Male    Birth control/protection: Surgical  Other Topics Concern   Not on file  Social History Narrative   Not on file   Social Determinants of Health   Financial Resource Strain: Not on file  Food Insecurity: Not on file  Transportation Needs: Not on file  Physical Activity: Not on file  Stress: Not on file  Social Connections: Not on file    Allergies:  Allergies  Allergen Reactions   Erythromycin Hives   Tetracyclines & Related Hives    Metabolic Disorder Labs: No results found for: "HGBA1C", "MPG" No results found for: "PROLACTIN" No results found for: "CHOL", "TRIG", "HDL", "CHOLHDL", "VLDL", "LDLCALC" Lab Results  Component Value Date   TSH 1.720 10/26/2021   TSH 1.53 12/30/2020    Therapeutic Level Labs: No results found for: "LITHIUM" No results found for: "VALPROATE" No results found for: "CBMZ"  Current Medications: Current Outpatient Medications  Medication Sig Dispense Refill   buPROPion (WELLBUTRIN XL) 150 MG 24 hr tablet Take 1 tablet (150 mg total) by mouth daily. Take total of 450 mg daily. Along with 300 mg tab 90 tablet 1   buPROPion (WELLBUTRIN XL) 300 MG 24 hr tablet Take 300 mg daily and 150 mg daily (total of 450 mg) 90 tablet 1   busPIRone (BUSPAR) 5 MG tablet Take 1 tablet (5 mg total) by mouth 2 (two) times daily. 180 tablet 0   CINNAMON PO Take 1,000 mg by mouth daily.     cyclobenzaprine (FLEXERIL) 10 MG tablet TAKE 1 TABLET BY MOUTH AT BEDTIME AS NEEDED FOR MUSCLE SPASMS. 30 tablet 1   FLUoxetine (PROZAC) 40 MG capsule Take 2 capsules (80 mg total) by mouth daily. 180 capsule 1   gabapentin (NEURONTIN) 600 MG tablet Take 600 MG (1 tablet) by  mouth in the morning, and then 300 MG (1/2 tablet) by mouth at noon, and then 600 MG (1 tablet) by mouth in the evening before bed. 225 tablet 4   lamoTRIgine (LAMICTAL) 25 MG tablet Titration schedule as follows. Lamotrigine MorningEvening Week 1 25mg    Week 2 25mg  25mg  Week 3 50mg  25mg  Week 4 50mg  50mg  Week 5 75mg  50mg  Week 6 75mg  75mg  Week 7 100mg  75mg  Week 8 100mg  100mg  270 tablet 0   lansoprazole (PREVACID)  15 MG capsule Take 15 mg by mouth daily at 12 noon.     Liraglutide -Weight Management (SAXENDA) 18 MG/3ML SOPN Inject 0.6 mg into the skin daily for 7 days, THEN 1.2 mg daily for 7 days, THEN 1.8 mg daily for 7 days, THEN 2.4 mg daily for 7 days, THEN 3 mg daily. 52 mL 0   LORazepam (ATIVAN) 1 MG tablet Take 1 tablet (1 mg total) by mouth daily as needed for anxiety. 30 tablet 2   metoprolol succinate (TOPROL-XL) 50 MG 24 hr tablet Take 1 tablet (50 mg total) by mouth daily. Take with or immediately following a meal.Take with or immediately following a meal. 90 tablet 4   naproxen (NAPROSYN) 500 MG tablet TAKE 1 TABLET BY MOUTH TWICE A DAY WITH MEALS 180 tablet 3   terbinafine (LAMISIL) 250 MG tablet Take 1 tablet (250 mg total) by mouth daily. 90 tablet 0   valACYclovir (VALTREX) 1000 MG tablet Take 1 tablet (1,000 mg total) by mouth 2 (two) times daily. 60 tablet 4   vitamin B-12 (CYANOCOBALAMIN) 1000 MCG tablet Take 5,000 mcg by mouth daily.      Vitamin D, Cholecalciferol, 1000 UNITS TABS Take 5,000 Units by mouth daily.      No current facility-administered medications for this visit.     Musculoskeletal: Strength & Muscle Tone: within normal limits Gait & Station: normal Patient leans: N/A  Psychiatric Specialty Exam: Review of Systems  Psychiatric/Behavioral:  Negative for agitation, behavioral problems, confusion, decreased concentration, dysphoric mood, hallucinations, self-injury, sleep disturbance and suicidal ideas. The patient is nervous/anxious. The patient is  not hyperactive.   All other systems reviewed and are negative.   There were no vitals taken for this visit.There is no height or weight on file to calculate BMI.  General Appearance: Fairly Groomed  Eye Contact:  Good  Speech:  Clear and Coherent  Volume:  Normal  Mood:   fine  Affect:  Appropriate, Congruent, and calm  Thought Process:  Coherent  Orientation:  Full (Time, Place, and Person)  Thought Content: Logical   Suicidal Thoughts:  No  Homicidal Thoughts:  No  Memory:  Immediate;   Good  Judgement:  Good  Insight:  Good  Psychomotor Activity:  Normal  Concentration:  Concentration: Good and Attention Span: Good  Recall:  Good  Fund of Knowledge: Good  Language: Good  Akathisia:  No  Handed:  Right  AIMS (if indicated): not done  Assets:  Communication Skills Desire for Improvement  ADL's:  Intact  Cognition: WNL  Sleep:  Fair   Screenings: GAD-7    Flowsheet Row Office Visit from 01/05/2022 in [facility] Office Visit from 11/16/2021 in [facility] Office Visit from 10/26/2021 in [facility] Office Visit from 07/17/2021 in [facility] Office Visit from 07/21/2020 in [facility]  Total GAD-7 Score 21 21 12 21 21       PHQ2-9    Flowsheet Row Office Visit from 01/05/2022 in [facility] Office Visit from 11/16/2021 in [facility] Office Visit from 10/26/2021 in [facility] Office Visit from 08/31/2021 in [facility] Office Visit from 07/17/2021 in [facility]  PHQ-2 Total Score 2 4 2 2 2   PHQ-9 Total Score 11 22 11 10 13       Flowsheet Row Office Visit from 08/31/2021 in [facility] ED from 07/09/2021 in [facility] Video Visit from 11/07/2020 in  [facility]  C-SSRS RISK CATEGORY Low Risk No Risk Error: Q3, 4, or 5 should not be populated when Q2  is No        Assessment and Plan:  Jaleen Bousquet is a 51 y.o. year old female with a history of depression, who presents for follow up appointment for below.   1. MDD (major depressive disorder), recurrent episode, mild (HCC) 2. Anxiety Although she has occasional depressive symptoms and then anxiety, it has been manageable and she denies any significant concern at this time. Psychosocial stressors includes losing custody of her 1 year old daughter, and conflict with her ex husband.  She reports good relationship with her boyfriend.  Will continue current medication regimen; will continue fluoxetine to target depression and anxiety.  Will continue bupropion to target depression; noted that she has been on this medication at least for several months without known side effect/she has strong preference to stay on this medication.  She is aware that this medication to be tapered off if any concern of seizure disorder.  Will continue BuSpar to target anxiety.  Will continue lorazepam as needed for anxiety.  Noted that she has been prescribed Abilify by her neurologist.  She was informed of its potential risk of metabolic side effect.  She also agrees to consolidate mental health treatment to this writer if this medication were to be continued.  She will greatly benefit from CBT; will make referral.   Plan Continue fluoxetine 80 mg daily (monitor weight gain) Continue Bupropion 450 mg daily Continue Buspar 5 mg twice day  Continue lorazepam 1 mg as needed for anxiety - she rarely takes this medication, declined refill Next appointment: 9/1 at 9 AM for 30 mins, video Referral to therapy   Past trials of medication: Cymbalta, Zoloft, Prozac, bupropion, Lithium (sick), Depakote (spending money), Latuda, Abilify (did not work), quetiapine (weight gain, somnolence). Geodon (PVC occurred)    I have reviewed suicide assessment in detail. No change in the following assessment.    The patient demonstrates  the following risk factors for suicide: Chronic risk factors for suicide include: psychiatric disorder of depression and previous suicide attempts of overdosing medication. Acute risk factors for suicide include: family or marital conflict. Protective factors for this patient include: responsibility to others (children, family), coping skills and hope for the future. Patient is future oriented and agrees to come for next appointment. Considering these factors, the overall suicide risk at this point appears to be low. She is future oriented, and is amenable to treatment. Patient is appropriate for outpatient follow up.        Collaboration of Care: Collaboration of Care: Other N/A  Patient/Guardian was advised Release of Information must be obtained prior to any record release in order to collaborate their care with an outside provider. Patient/Guardian was advised if they have not already done so to contact the registration department to sign all necessary forms in order for [place] to release information regarding their care.   Consent: Patient/Guardian gives verbal consent for treatment and assignment of benefits for services provided during this visit. Patient/Guardian expressed understanding and agreed to proceed.    Tracie Hotter, MD 01/26/2022, 11:05 AM

## 2022-01-22 NOTE — Telephone Encounter (Signed)
Please advise 

## 2022-01-23 ENCOUNTER — Other Ambulatory Visit: Payer: Self-pay | Admitting: Podiatry

## 2022-01-23 DIAGNOSIS — Z79899 Other long term (current) drug therapy: Secondary | ICD-10-CM

## 2022-01-23 NOTE — Progress Notes (Signed)
Hepatic function Panel prior to Lamisil refill. - Dr. Logan Bores

## 2022-01-23 NOTE — Telephone Encounter (Signed)
Patient needs LFT prior to refill. Order placed in patient's chart. If WNL we will refill the Lamisil.  Please notify patient.  Thanks, Dr. Logan Bores

## 2022-01-26 ENCOUNTER — Telehealth (INDEPENDENT_AMBULATORY_CARE_PROVIDER_SITE_OTHER): Payer: 59 | Admitting: Psychiatry

## 2022-01-26 ENCOUNTER — Encounter: Payer: Self-pay | Admitting: Psychiatry

## 2022-01-26 ENCOUNTER — Encounter: Payer: Self-pay | Admitting: Nurse Practitioner

## 2022-01-26 DIAGNOSIS — F330 Major depressive disorder, recurrent, mild: Secondary | ICD-10-CM

## 2022-01-26 DIAGNOSIS — F419 Anxiety disorder, unspecified: Secondary | ICD-10-CM

## 2022-02-08 ENCOUNTER — Telehealth: Payer: Self-pay | Admitting: Nurse Practitioner

## 2022-02-08 NOTE — Telephone Encounter (Signed)
Copied from CRM [id number]. Topic: General - Other >> Feb 08, 2022  4:33 PM Turkey B wrote: Reason for CRM: Patient called in states the prescribed med, Liraglutide -Weight Management (SAXENDA) 18 MG/3ML SOPN , she can't afford. Its not covered by her insurance. She is looking for another alternative,

## 2022-02-09 NOTE — Telephone Encounter (Signed)
Spoke with patient and made her aware of Jolene's recommendations. Patient verbalized understanding and has no further questions at this time.  °

## 2022-02-15 ENCOUNTER — Ambulatory Visit (INDEPENDENT_AMBULATORY_CARE_PROVIDER_SITE_OTHER): Payer: 59 | Admitting: Behavioral Health

## 2022-02-15 DIAGNOSIS — F3181 Bipolar II disorder: Secondary | ICD-10-CM | POA: Diagnosis not present

## 2022-02-15 NOTE — Progress Notes (Addendum)
[facility] Counselor Initial Adult Exam  Name: Tracie James Date: 02/15/2022 MRN: [id number] DOB: July 30, 1970 PCP: Marjie Skiff, NP  Time spent: 60 min  Guardian/Payee:  Self    Paperwork requested: Yes ; Pt will forward the ruling from the [facility] in her 51yo Dtr's Court Case to include Parental Psych Evals & her Dtr's portion of the Eval from [place] in [place].  Reason for Visit /Presenting Problem: Pt has been dealing with the [facility] Court System due to her 51yo Dtr's change in residency over Christmas in 2019.  Mental Status Exam: Appearance:   Neat     Behavior:  Appropriate  Motor:  Normal  Speech/Language:   Normal Rate  Affect:  Tearful  Mood:  depressed  Thought process:  normal  Thought content:    WNL  Sensory/Perceptual disturbances:    WNL  Orientation:  oriented to person, place, and time/date  Attention:  Good  Concentration:  Good  Memory:  Fair w/low to poor date retrieval  [employer] of knowledge:   Good  Insight:    Fair  Judgment:   Fair  Impulse Control:  Good   Appearance: Neat    Behavior: Appropriate Motor: Normal Speech/Language: Normal Rate Affect: Appropriate Mood: normal Thought process: normal Thought content: WNL Sensory/Perceptual disturbances: WNL Orientation: oriented to person, place, and time/date Attention: Good Concentration: Good Memory:  fair Fund of knowledge: Good Insight: Fair Judgment: Fair Impulse Control: Good  Reported Symptoms:  Pt sts she has been tearful lately & finally decided to listen to Dr. Bing Matter suggestion she commit time to Therapy  Risk Assessment: Danger to Self:  No Self-injurious Behavior: No Danger to Others: No Duty to Warn:no Physical Aggression / Violence:No  Access to Firearms a concern: No  Gang Involvement:No  Patient / guardian was educated about steps to take if suicide or homicide risk level increases between visits: no While future psychiatric events cannot  be accurately predicted, the patient does not currently require acute inpatient psychiatric care and does not currently meet [facility] involuntary commitment criteria.  Substance Abuse History: Current substance abuse: No     Past Psychiatric History:   Previous psychological history is significant for Bipolar II d/o since Pt was in her early Teens-Pt can verify several Providers who have given Dx of Bipolar II d/o, "[place]", F members have also noticed beh they id as Bipolar Outpatient Providers:Dr. Vanetta Shawl, MD - Pt sees her Psychiatrist q3 mos History of Psych Hospitalization:  unk Psychological Testing:  Parental Psych Eval completed in [place] by Anette Guarneri    Abuse History:  Victim of: No.,  n/a    Report needed: No. Victim of Neglect:No. Perpetrator of  unk @ this time as Bros has called her "unfit"   Witness / Exposure to Domestic Violence: No   Protective Services Involvement: No  Witness to [employer] Violence:  No   Family History:  Family History  Problem Relation Age of Onset   Depression Mother    Multiple sclerosis Mother    Schizophrenia Maternal Grandmother    Heart disease Maternal Grandmother        aortic valve replacement    Living situation: the patient lives alone & has a female companion Shyrl Numbers  Sexual Orientation: Straight  Relationship Status: divorced  Name of spouse / other:James Karlene Lineman If a parent, number of children / ages:2 Dtrs; Emah - age 36yo & Dahlia Client - age 23yo  Support Systems: n/a  Surveyor, quantity  Stress:  Yes - Pt is paying off 10K bill for call to EMS & Hosp visit this past June for reported Sz activity  Income/Employment/Disability: Employment w/ [employer]; she has worked there for 13 yrs  Financial planner: No   Educational History: Education: Risk manager: unk  Any cultural differences that may affect / interfere with treatment:  unk  Recreation/Hobbies:  reading  Stressors: Neurosurgeon issue   Loss of Dtr Emah who currently resides in [facility] w/her Father since Dec 2019    Strengths: Family and Friends  Barriers:  Pt follow through w/Clinical requests may frustrate her since this custody issue has been exhausting.   Legal History: Pending legal issue / charges:  Pt is currently involved in aftermath of custody dispute of unknown Ruling by the [employer] System w/o a cc of the Ruling. History of legal issue / charges:  Custody Dispute  Medical History/Surgical History: reviewed Past Medical History:  Diagnosis Date   Anxiety    Asthma    Bipolar 2 disorder (HCC)    Depression    GERD (gastroesophageal reflux disease)    Palpitations    PVC (premature ventricular contraction)     Past Surgical History:  Procedure Laterality Date   ABDOMINAL HYSTERECTOMY     patient is not sure if it was total or complete   APPENDECTOMY      Medications: Current Outpatient Medications  Medication Sig Dispense Refill   buPROPion (WELLBUTRIN XL) 150 MG 24 hr tablet Take 1 tablet (150 mg total) by mouth daily. Take total of 450 mg daily. Along with 300 mg tab 90 tablet 1   buPROPion (WELLBUTRIN XL) 300 MG 24 hr tablet Take 300 mg daily and 150 mg daily (total of 450 mg) 90 tablet 1   busPIRone (BUSPAR) 5 MG tablet Take 1 tablet (5 mg total) by mouth 2 (two) times daily. 180 tablet 0   CINNAMON PO Take 1,000 mg by mouth daily.     cyclobenzaprine (FLEXERIL) 10 MG tablet TAKE 1 TABLET BY MOUTH AT BEDTIME AS NEEDED FOR MUSCLE SPASMS. 30 tablet 1   FLUoxetine (PROZAC) 40 MG capsule Take 2 capsules (80 mg total) by mouth daily. 180 capsule 1   gabapentin (NEURONTIN) 600 MG tablet Take 600 MG (1 tablet) by mouth in the morning, and then 300 MG (1/2 tablet) by mouth at noon, and then 600 MG (1 tablet) by mouth in the evening before bed. 225 tablet 4   lamoTRIgine (LAMICTAL) 25 MG tablet Titration schedule as  follows. Lamotrigine MorningEvening Week 1 25mg    Week 2 25mg  25mg  Week 3 50mg  25mg  Week 4 50mg  50mg  Week 5 75mg  50mg  Week 6 75mg  75mg  Week 7 100mg  75mg  Week 8 100mg  100mg  270 tablet 0   lansoprazole (PREVACID) 15 MG capsule Take 15 mg by mouth daily at 12 noon.     Liraglutide -Weight Management (SAXENDA) 18 MG/3ML SOPN Inject 0.6 mg into the skin daily for 7 days, THEN 1.2 mg daily for 7 days, THEN 1.8 mg daily for 7 days, THEN 2.4 mg daily for 7 days, THEN 3 mg daily. 52 mL 0   LORazepam (ATIVAN) 1 MG tablet Take 1 tablet (1 mg total) by mouth daily as needed for anxiety. 30 tablet 2   metoprolol succinate (TOPROL-XL) 50 MG 24 hr tablet Take 1 tablet (50 mg total) by mouth daily. Take with or immediately following a meal.Take with or immediately following a meal. 90  tablet 4   naproxen (NAPROSYN) 500 MG tablet TAKE 1 TABLET BY MOUTH TWICE A DAY WITH MEALS 180 tablet 3   terbinafine (LAMISIL) 250 MG tablet Take 1 tablet (250 mg total) by mouth daily. 90 tablet 0   valACYclovir (VALTREX) 1000 MG tablet Take 1 tablet (1,000 mg total) by mouth 2 (two) times daily. 60 tablet 4   vitamin B-12 (CYANOCOBALAMIN) 1000 MCG tablet Take 5,000 mcg by mouth daily.      Vitamin D, Cholecalciferol, 1000 UNITS TABS Take 5,000 Units by mouth daily.      No current facility-administered medications for this visit.    Allergies  Allergen Reactions   Erythromycin Hives   Tetracyclines & Related Hives    Diagnoses:  No diagnosis found.  Plan of Care: Secure a cc of the [place] Court [employer]. Schedule psychotherapy   Deneise Lever, LMFT

## 2022-02-15 NOTE — Progress Notes (Signed)
                Trigg Delarocha L Dylan Ruotolo, LMFT 

## 2022-02-16 ENCOUNTER — Ambulatory Visit: Payer: 59 | Admitting: Nurse Practitioner

## 2022-02-17 NOTE — Telephone Encounter (Signed)
Called patient ,no answer, left voice message for call back.

## 2022-02-18 ENCOUNTER — Ambulatory Visit: Payer: 59 | Admitting: Behavioral Health

## 2022-02-20 ENCOUNTER — Other Ambulatory Visit: Payer: Self-pay | Admitting: Nurse Practitioner

## 2022-02-22 NOTE — Telephone Encounter (Signed)
Requested medication (s) are due for refill today: yes 03/12/22  Requested medication (s) are on the active medication list: yes  Last refill:  12/10/21 #30 1 refills  Future visit scheduled: no  Notes to clinic:  not delegated per protocol. Do you want to refill Rx?     Requested Prescriptions  Pending Prescriptions Disp Refills   cyclobenzaprine (FLEXERIL) 10 MG tablet [Pharmacy Med Name: CYCLOBENZAPRINE 10 MG TABLET] 30 tablet 1    Sig: TAKE 1 TABLET BY MOUTH AT BEDTIME AS NEEDED FOR MUSCLE SPASMS.     Not Delegated - Analgesics:  Muscle Relaxants Failed - 02/20/2022  7:26 PM      Failed - This refill cannot be delegated      Passed - Valid encounter within last 6 months    Recent Outpatient Visits           1 month ago Seizure disorder (HCC)   [facility] Cannady, Corrie Dandy T, NP   3 months ago Seizure disorder (HCC)   [facility] Cannady, Jolene T, NP   3 months ago Seizure disorder (HCC)   [facility] Cannady, Dorie Rank, NP   6 months ago Fibromyalgia   [facility] [place], [place] T, NP   7 months ago Palpitations   [facility] Carmine, Tyrone, DO

## 2022-02-23 ENCOUNTER — Other Ambulatory Visit: Payer: Self-pay | Admitting: Psychiatry

## 2022-02-23 DIAGNOSIS — F330 Major depressive disorder, recurrent, mild: Secondary | ICD-10-CM

## 2022-02-23 NOTE — Telephone Encounter (Signed)
LVM asking patient to call back to schedule her follow up appointment 

## 2022-02-24 NOTE — Telephone Encounter (Signed)
Patient states that she feels she does not need a follow up appointment. She states the medications she is on are making her sick.

## 2022-03-03 ENCOUNTER — Ambulatory Visit: Payer: 59 | Admitting: Behavioral Health

## 2022-03-05 ENCOUNTER — Ambulatory Visit: Payer: 59 | Admitting: Behavioral Health

## 2022-03-05 NOTE — Progress Notes (Unsigned)
                Atreus Hasz L Jamilett Ferrante, LMFT 

## 2022-03-17 NOTE — Addendum Note (Signed)
Addended by: Deneise Lever on: 03/17/2022 01:01 PM   Modules accepted: Level of Service

## 2022-03-18 NOTE — Progress Notes (Signed)
Virtual Visit via Video Note  I connected with Tracie James on 03/19/22 at  9:00 AM EDT by a video enabled telemedicine application and verified that I am speaking with the correct person using two identifiers.  Location: Patient: home Provider: office Persons participated in the visit- patient, provider    I discussed the limitations of evaluation and management by telemedicine and the availability of in person appointments. The patient expressed understanding and agreed to proceed.    I discussed the assessment and treatment plan with the patient. The patient was provided an opportunity to ask questions and all were answered. The patient agreed with the plan and demonstrated an understanding of the instructions.   The patient was advised to call back or seek an in-person evaluation if the symptoms worsen or if the condition fails to improve as anticipated.  I provided 15 minutes of non-face-to-face time during this encounter.   [name], MD    [facility] MD/PA/NP OP Progress Note  03/19/2022 9:36 AM Tracie James  MRN:  [id number]  Chief Complaint:  Chief Complaint  Patient presents with   Depression   Follow-up   HPI:  This is a follow-up appointment for depression and anxiety.  She states that she had a blow up at work.  It was too much and she was on edge.  She feels she had enough and walked out.  She cried all day afterwards.  She felt like an axx, and wrote an apology letter afterwards.  She is planning to stay at the current work at this time.  Although there has been scheduling issues with her therapist, she is hoping to have the next appointment with her therapist.  She has good relationship with her boyfriend and her oldest daughter.  She admits that she tends to be irritable.  She has occasional insomnia.  She had intense anxiety and took Ativan a few days ago.  She denies SI.  She denies alcohol use or drug use.  She thinks she is doing fine, and would like to stay on  the medication as it is.   Employment: Designer, industrial/product Support: boyfriend of ten years Household: boyfriend of 11 years in 2022 Marital status: divorced Number of children: 2 daughters, oldest is 52 year old, will move to [place]  Visit Diagnosis:    ICD-10-CM   1. Anxiety  F41.9     2. MDD (major depressive disorder), recurrent episode, mild (HCC)  F33.0 buPROPion (WELLBUTRIN XL) 150 MG 24 hr tablet      Past Psychiatric History: Please see initial evaluation for full details. I have reviewed the history. No updates at this time.     Past Medical History:  Past Medical History:  Diagnosis Date   Anxiety    Asthma    Bipolar 2 disorder (HCC)    Depression    GERD (gastroesophageal reflux disease)    Palpitations    PVC (premature ventricular contraction)     Past Surgical History:  Procedure Laterality Date   ABDOMINAL HYSTERECTOMY     patient is not sure if it was total or complete   APPENDECTOMY      Family Psychiatric History: Please see initial evaluation for full details. I have reviewed the history. No updates at this time.     Family History:  Family History  Problem Relation Age of Onset   Depression Mother    Multiple sclerosis Mother    Schizophrenia Maternal Grandmother    Heart disease Maternal Grandmother  aortic valve replacement    Social History:  Social History   Socioeconomic History   Marital status: Divorced    Spouse name: Not on file   Number of children: Not on file   Years of education: Not on file   Highest education level: Not on file  Occupational History   Not on file  Tobacco Use   Smoking status: Former    Types: Cigarettes    Quit date: 03/31/2014    Years since quitting: 7.9   Smokeless tobacco: Never  Vaping Use   Vaping Use: Never used  Substance and Sexual Activity   Alcohol use: No   Drug use: No   Sexual activity: Yes    Partners: Male    Birth control/protection: Surgical  Other Topics Concern   Not on file   Social History Narrative   Not on file   Social Determinants of Health   Financial Resource Strain: Not on file  Food Insecurity: Not on file  Transportation Needs: Not on file  Physical Activity: Not on file  Stress: Not on file  Social Connections: Not on file    Allergies:  Allergies  Allergen Reactions   Erythromycin Hives   Tetracyclines & Related Hives    Metabolic Disorder Labs: No results found for: "HGBA1C", "MPG" No results found for: "PROLACTIN" No results found for: "CHOL", "TRIG", "HDL", "CHOLHDL", "VLDL", "LDLCALC" Lab Results  Component Value Date   TSH 1.720 10/26/2021   TSH 1.53 12/30/2020    Therapeutic Level Labs: No results found for: "LITHIUM" No results found for: "VALPROATE" No results found for: "CBMZ"  Current Medications: Current Outpatient Medications  Medication Sig Dispense Refill   [START ON 04/20/2022] buPROPion (WELLBUTRIN XL) 150 MG 24 hr tablet Take 1 tablet (150 mg total) by mouth daily. Take total of 450 mg daily. Along with 300 mg tab 90 tablet 1   buPROPion (WELLBUTRIN XL) 300 MG 24 hr tablet TAKE 300 MG DAILY AND 150 MG DAILY (TOTAL OF 450 MG) 90 tablet 1   [START ON 03/22/2022] busPIRone (BUSPAR) 5 MG tablet Take 1 tablet (5 mg total) by mouth 2 (two) times daily. 180 tablet 1   CINNAMON PO Take 1,000 mg by mouth daily.     cyclobenzaprine (FLEXERIL) 10 MG tablet TAKE 1 TABLET BY MOUTH AT BEDTIME AS NEEDED FOR MUSCLE SPASMS. 30 tablet 1   FLUoxetine (PROZAC) 40 MG capsule Take 2 capsules (80 mg total) by mouth daily. 180 capsule 1   gabapentin (NEURONTIN) 600 MG tablet Take 600 MG (1 tablet) by mouth in the morning, and then 300 MG (1/2 tablet) by mouth at noon, and then 600 MG (1 tablet) by mouth in the evening before bed. 225 tablet 4   lamoTRIgine (LAMICTAL) 25 MG tablet Titration schedule as follows. Lamotrigine MorningEvening Week 1 25mg    Week 2 25mg  25mg  Week 3 50mg  25mg  Week 4 50mg  50mg  Week 5 75mg  50mg  Week  6 75mg  75mg  Week 7 100mg  75mg  Week 8 100mg  100mg  270 tablet 0   lansoprazole (PREVACID) 15 MG capsule Take 15 mg by mouth daily at 12 noon.     Liraglutide -Weight Management (SAXENDA) 18 MG/3ML SOPN Inject 0.6 mg into the skin daily for 7 days, THEN 1.2 mg daily for 7 days, THEN 1.8 mg daily for 7 days, THEN 2.4 mg daily for 7 days, THEN 3 mg daily. (Patient not taking: Reported on 03/19/2022) 52 mL 0   LORazepam (ATIVAN) 1 MG tablet Take 1 tablet (  1 mg total) by mouth daily as needed for anxiety. 30 tablet 2   metoprolol succinate (TOPROL-XL) 50 MG 24 hr tablet Take 1 tablet (50 mg total) by mouth daily. Take with or immediately following a meal.Take with or immediately following a meal. 90 tablet 4   naproxen (NAPROSYN) 500 MG tablet TAKE 1 TABLET BY MOUTH TWICE A DAY WITH MEALS 180 tablet 3   terbinafine (LAMISIL) 250 MG tablet Take 1 tablet (250 mg total) by mouth daily. 90 tablet 0   valACYclovir (VALTREX) 1000 MG tablet Take 1 tablet (1,000 mg total) by mouth 2 (two) times daily. 60 tablet 4   vitamin B-12 (CYANOCOBALAMIN) 1000 MCG tablet Take 5,000 mcg by mouth daily.      Vitamin D, Cholecalciferol, 1000 UNITS TABS Take 5,000 Units by mouth daily.      No current facility-administered medications for this visit.     Musculoskeletal: Strength & Muscle Tone:  N/A Gait & Station:  N/A Patient leans: N/A  Psychiatric Specialty Exam: Review of Systems  Psychiatric/Behavioral:  Positive for dysphoric mood and sleep disturbance. Negative for agitation, behavioral problems, confusion, decreased concentration, hallucinations, self-injury and suicidal ideas. The patient is nervous/anxious. The patient is not hyperactive.   All other systems reviewed and are negative.   There were no vitals taken for this visit.There is no height or weight on file to calculate BMI.  General Appearance: Fairly Groomed  Eye Contact:  Good  Speech:  Clear and Coherent  Volume:  Normal  Mood:   fine  Affect:   Appropriate, Congruent, and down at times  Thought Process:  Coherent  Orientation:  Full (Time, Place, and Person)  Thought Content: Logical   Suicidal Thoughts:  No  Homicidal Thoughts:  No  Memory:  Immediate;   Good  Judgement:  Good  Insight:  Good  Psychomotor Activity:  Normal  Concentration:  Concentration: Good and Attention Span: Good  Recall:  Good  Fund of Knowledge: Good  Language: Good  Akathisia:  No  Handed:  Right  AIMS (if indicated): not done  Assets:  Communication Skills Desire for Improvement  ADL's:  Intact  Cognition: WNL  Sleep:  Poor   Screenings: GAD-7    Flowsheet Row Office Visit from 01/05/2022 in [facility] Office Visit from 11/16/2021 in [facility] Office Visit from 10/26/2021 in [facility] Office Visit from 07/17/2021 in [facility] Office Visit from 07/21/2020 in [facility]  Total GAD-7 Score 21 21 12 21 21       PHQ2-9    Flowsheet Row Office Visit from 01/05/2022 in [facility] Office Visit from 11/16/2021 in [facility] Office Visit from 10/26/2021 in [facility] Office Visit from 08/31/2021 in [facility] Office Visit from 07/17/2021 in [facility]  PHQ-2 Total Score 2 4 2 2 2   PHQ-9 Total Score 11 22 11 10 13       Flowsheet Row Office Visit from 08/31/2021 in [facility] ED from 07/09/2021 in [facility] Video Visit from 11/07/2020 in [facility]  C-SSRS RISK CATEGORY Low Risk No Risk Error: Q3, 4, or 5 should not be populated when Q2 is No        Assessment and Plan:  Tracie James is a 51 y.o. year old female with a history of depression, who presents for follow up appointment for below.   1. MDD (major depressive disorder), recurrent  episode, mild (HCC) 2. Anxiety The patient continues to have  depressive symptoms and anxiety along with irritability since the last visit. Psychosocial stressors includes losing custody of her 60 year old daughter, and conflict with her ex husband.  She reports good relationship with her boyfriend.  Although she was advised of medication adjustment, she prefers to stay at the current medication regimen.  Will continue fluoxetine to target depression and anxiety.  Will continue bupropion to target depression. noted that she has been on this medication at least for several months without known side effect/she has strong preference to stay on this medication.  She is aware that this medication to be tapered off if any concern of seizure disorder.  Will continue BuSpar for anxiety.  She will greatly benefit from CBT ; she is wanting to continue to see her therapist.    Plan Continue fluoxetine 80 mg daily (monitor weight gain) Continue Bupropion 450 mg daily Continue Buspar 5 mg twice day  Continue lorazepam 1 mg as needed for anxiety - she rarely takes this medication, declined refill Next appointment: 10/20 at 8:30  for 30 mins, video   Past trials of medication: Cymbalta, Zoloft, Prozac, bupropion, Lithium (sick), Depakote (spending money), Latuda, Abilify (did not work), quetiapine (weight gain, somnolence). Geodon (PVC occurred)    I have reviewed suicide assessment in detail. No change in the following assessment.    The patient demonstrates the following risk factors for suicide: Chronic risk factors for suicide include: psychiatric disorder of depression and previous suicide attempts of overdosing medication. Acute risk factors for suicide include: family or marital conflict. Protective factors for this patient include: responsibility to others (children, family), coping skills and hope for the future. Patient is future oriented and agrees to come for next appointment. Considering these factors, the overall suicide risk at this point appears to be low. She is  future oriented, and is amenable to treatment. Patient is appropriate for outpatient follow up.    Collaboration of Care: Collaboration of Care: Other N/A  Patient/Guardian was advised Release of Information must be obtained prior to any record release in order to collaborate their care with an outside provider. Patient/Guardian was advised if they have not already done so to contact the registration department to sign all necessary forms in order for [place] to release information regarding their care.   Consent: Patient/Guardian gives verbal consent for treatment and assignment of benefits for services provided during this visit. Patient/Guardian expressed understanding and agreed to proceed.    [name], MD 03/19/2022, 9:36 AM

## 2022-03-19 ENCOUNTER — Telehealth (INDEPENDENT_AMBULATORY_CARE_PROVIDER_SITE_OTHER): Payer: 59 | Admitting: Psychiatry

## 2022-03-19 ENCOUNTER — Encounter: Payer: Self-pay | Admitting: Psychiatry

## 2022-03-19 DIAGNOSIS — F419 Anxiety disorder, unspecified: Secondary | ICD-10-CM | POA: Diagnosis not present

## 2022-03-19 DIAGNOSIS — F330 Major depressive disorder, recurrent, mild: Secondary | ICD-10-CM | POA: Diagnosis not present

## 2022-03-19 MED ORDER — BUPROPION HCL ER (XL) 150 MG PO TB24: 150.0000 mg | ORAL_TABLET | Freq: Every day | ORAL | 1 refills | Status: DC

## 2022-03-19 MED ORDER — BUSPIRONE HCL 5 MG PO TABS: 5.0000 mg | ORAL_TABLET | Freq: Two times a day (BID) | ORAL | 1 refills | Status: DC

## 2022-03-19 NOTE — Patient Instructions (Signed)
Continue fluoxetine 80 mg daily  Continue Bupropion 450 mg daily Continue Buspar 5 mg twice day  Continue lorazepam 1 mg as needed for anxiety  Next appointment: 10/20 at 8:30

## 2022-03-29 ENCOUNTER — Ambulatory Visit (INDEPENDENT_AMBULATORY_CARE_PROVIDER_SITE_OTHER): Payer: 59 | Admitting: Behavioral Health

## 2022-03-29 DIAGNOSIS — F3181 Bipolar II disorder: Secondary | ICD-10-CM | POA: Diagnosis not present

## 2022-03-29 NOTE — Progress Notes (Signed)
[name] Counselor/Therapist Progress Note  Patient ID: Tracie James, MRN: [id number],    Date: 03/29/2022  Time Spent: 55 min In Person @ [facility] Office   Treatment Type: Individual Therapy  Reported Symptoms: Pt is tearful & anxious as she does not know how to regain partial custody of her Dtr. Pt has not spoken w/her Dtr in almost one year. Dtr lives w/Fr who lives in [place] & has Full custody through legal proceedings in [place].  Mental Status Exam: Appearance:  Casual     Behavior: Appropriate and Attention-Seeking  Motor: Normal  Speech/Language:  Normal Rate  Affect: Tearful  Mood: anxious and labile  Thought process: normal  Thought content:   WNL  Sensory/Perceptual disturbances:   WNL  Orientation: oriented to person, place, and time/date  Attention: Good  Concentration: Good  Memory: WNL & selective @ times  Fund of knowledge:  Good  Insight:   Fair  Judgment:  Fair  Impulse Control: Fair   Risk Assessment: Danger to Self:  No Self-injurious Behavior: No Danger to Others: No Duty to Warn:no Physical Aggression / Violence:No  Access to Firearms a concern: No  Gang Involvement:No   Subjective: Pt is tearful today & lacks means of describing the purpose of her visit into psychotherapy today. Pt has a full battery of testing done in [place] to determine her Parental capacity via a Parental Eval.   Pt reports she is suspicious of the Tracie James who presided over the case of her Dtr's Custody w/her Husb. "He is Ex-Military, just like my Ex-Husb". Pt does not speak to her Father who talks regularly w/her Dtr in [facility]. Pt sts he, "does not want to get in the middle".  Pt appears confused @ times due to her lack of ability to describe what the Tracie James needs of her in Therapy. Pt denies a means of gaining this understanding.  Discussed the utility or capacity for Pt to have a second Parent Eval done.    Interventions: Dialectical Behavioral Therapy skills  approach. Re-iterated the need for Pt to have a medication review w/Dr. Vanetta Shawl, MD who may choose to add a mood stabilizer or other medication to even her lability.  Diagnosis:Bipolar II disorder (HCC)  Plan: ST: Next session we will complete several screeners & possibly a short PD Form as indicated. We will begin to work on emotional regulation skills due to c/o excess crying.   LT: Pt will utilize suggestions & feel empowered to assist herself by relevant use of resources & suggestions by Clinician.   Deneise Lever, LMFT

## 2022-03-29 NOTE — Progress Notes (Signed)
                Diasia Henken L Chanah Tidmore, LMFT 

## 2022-04-19 ENCOUNTER — Ambulatory Visit (INDEPENDENT_AMBULATORY_CARE_PROVIDER_SITE_OTHER): Payer: 59 | Admitting: Behavioral Health

## 2022-04-19 DIAGNOSIS — F331 Major depressive disorder, recurrent, moderate: Secondary | ICD-10-CM | POA: Diagnosis not present

## 2022-04-19 DIAGNOSIS — F3181 Bipolar II disorder: Secondary | ICD-10-CM

## 2022-04-19 NOTE — Progress Notes (Signed)
                Jerret Mcbane L Koraline Phillipson, LMFT 

## 2022-04-19 NOTE — Progress Notes (Signed)
[facility] Counselor/Therapist Progress Note  Patient ID: Tracie James, MRN: [id number],    Date: 04/19/2022  Time Spent: 74 min In Person @ [facility]   Treatment Type: Individual Therapy  Reported Symptoms: Pt sts she is doing "better" today. She found her empty bottle of Buspar 10mg  & it reminded her to get her refill. Pt is unaware how long she has been w/o it since she did not get a reminder from the Rx Store, but she re-initiated it a week ago & has felt more calm since.  BF Tawni Millers has cont'd to be supportive of situation for younger Dtr Terrence Dupont who lives in [facility] w/her Father, but does not speak w/Family up [place] except her Gfather.   Mental Status Exam: Appearance:  Neat     Behavior: Appropriate and Sharing  Motor: Normal  Speech/Language:  Clear and Coherent and Normal Rate  Affect: Appropriate  Mood: normal  Thought process: normal  Thought content:   WNL  Sensory/Perceptual disturbances:   WNL  Orientation: oriented to person, place, and time/date  Attention: Good  Concentration: Good  Memory: WNL  Fund of knowledge:  Good  Insight:   Fair  Judgment:  Good  Impulse Control: Good   Risk Assessment: Danger to Self:  No Self-injurious Behavior: No Danger to Others: No Duty to Warn:no Physical Aggression / Violence:No  Access to Firearms a concern: No  Gang Involvement:No   Subjective: Pt reports she is better today, less emot'l than last visit. She is happy to be taking her Buspar 10mg  again as it is helping. Pt still lacks clarity from the [facility] in [facility] about his expectations for her visits w/this Clinician. Court Report is not clear.  Interventions: Narrative and Psycho-education/Bibliotherapy  Diagnosis:Bipolar II disorder ([place])  MDD (major depressive disorder), recurrent episode, moderate ([place])  Plan: Jennings sts she feels better w/her Buspar 10mg  on board again. Pt will cont to take this medication & keep track of the refill  date so this does not happen again.  Target Date: Before 05/19/2022 or next refill date Progress: 0 Frequency: Twice monthly Modality: Melinda Crutch Kieli reports she still does not understand what the Marveen Reeks wants her to accomplish in psychotherapy. Pt has not contacted the [employer] or her Atty to obtain this information. Pt will f/u & then r/s an appt w/this Clinician once she has more information.   Target Date: 05/19/2022  Progress: TBD; Pt will r/s once she contacts [place] a/or her Atty to clarify goals  Frequency: Twice monthly  Modality: Boykin Reaper, LMFT

## 2022-05-06 NOTE — Progress Notes (Signed)
Virtual Visit via Video Note  I connected with Tracie James on 05/07/22 at  8:30 AM EDT by a video enabled telemedicine application and verified that I am speaking with the correct person using two identifiers.  Location: Patient: home Provider: office Persons participated in the visit- patient, provider    I discussed the limitations of evaluation and management by telemedicine and the availability of in person appointments. The patient expressed understanding and agreed to proceed.    I discussed the assessment and treatment plan with the patient. The patient was provided an opportunity to ask questions and all were answered. The patient agreed with the plan and demonstrated an understanding of the instructions.   The patient was advised to call back or seek an in-person evaluation if the symptoms worsen or if the condition fails to improve as anticipated.  I provided 18 minutes of non-face-to-face time during this encounter.   Norman Clay, MD    [facility] MD/PA/NP OP Progress Note  05/07/2022 9:04 AM Tracie James  MRN:  [id number]  Chief Complaint:  Chief Complaint  Patient presents with   Depression   Follow-up   HPI:  This is a follow-up appointment for depression.  She states that she has been doing well at work.  Her supervisor talked with other coworkers, and they have been more helpful.  She reports good relationship with her boyfriend.  They do not go outside as much, and she feels content with.  She does not feel rested.  She does not have any fun days.  She agrees that it requires more energy for her to do things, although she is able to manage things.  She has not been able to contact with her daughter.  She hears from her father that her daughter has been doing it.  She tries not to think about her.  She feels like her daughter is dead.  She feels sad whenever she thinks about her.  She agrees to try grounding techniques.  She has initial and middle insomnia.  She  feels fatigue.  She has slight decrease in appetite, although she feels fine with that, and she is able to eat regular meals.  She denies SI.  She denies panic attacks.  She has not taken Ativan since the last visit.  She denies alcohol use, drug use or cigarette use.    Employment: Quarry manager Support: boyfriend of ten years Household: boyfriend of 11 years in 2022 Marital status: divorced Number of children: 2 daughters, oldest is 35 year old, will move to [place]  Visit Diagnosis:    ICD-10-CM   1. MDD (major depressive disorder), recurrent episode, mild (HCC)  F33.0 FLUoxetine (PROZAC) 40 MG capsule    2. Anxiety  F41.9 FLUoxetine (PROZAC) 40 MG capsule    3. Insomnia, unspecified type  G47.00       Past Psychiatric History: Please see initial evaluation for full details. I have reviewed the history. No updates at this time.     Past Medical History:  Past Medical History:  Diagnosis Date   Anxiety    Asthma    Bipolar 2 disorder ([place])    Depression    GERD (gastroesophageal reflux disease)    Palpitations    PVC (premature ventricular contraction)     Past Surgical History:  Procedure Laterality Date   ABDOMINAL HYSTERECTOMY     patient is not sure if it was total or complete   APPENDECTOMY      Family Psychiatric History: Please  see initial evaluation for full details. I have reviewed the history. No updates at this time.     Family History:  Family History  Problem Relation Age of Onset   Depression Mother    Multiple sclerosis Mother    Schizophrenia Maternal Grandmother    Heart disease Maternal Grandmother        aortic valve replacement    Social History:  Social History   Socioeconomic History   Marital status: Divorced    Spouse name: Not on file   Number of children: Not on file   Years of education: Not on file   Highest education level: Not on file  Occupational History   Not on file  Tobacco Use   Smoking status: Former    Types: Cigarettes     Quit date: 03/31/2014    Years since quitting: 8.1   Smokeless tobacco: Never  Vaping Use   Vaping Use: Never used  Substance and Sexual Activity   Alcohol use: No   Drug use: No   Sexual activity: Yes    Partners: Male    Birth control/protection: Surgical  Other Topics Concern   Not on file  Social History Narrative   Not on file   Social Determinants of Health   Financial Resource Strain: Not on file  Food Insecurity: Not on file  Transportation Needs: Not on file  Physical Activity: Not on file  Stress: Not on file  Social Connections: Not on file    Allergies:  Allergies  Allergen Reactions   Erythromycin Hives   Tetracyclines & Related Hives    Metabolic Disorder Labs: No results found for: "HGBA1C", "MPG" No results found for: "PROLACTIN" No results found for: "CHOL", "TRIG", "HDL", "CHOLHDL", "VLDL", "LDLCALC" Lab Results  Component Value Date   TSH 1.720 10/26/2021   TSH 1.53 12/30/2020    Therapeutic Level Labs: No results found for: "LITHIUM" No results found for: "VALPROATE" No results found for: "CBMZ"  Current Medications: Current Outpatient Medications  Medication Sig Dispense Refill   cariprazine (VRAYLAR) 1.5 MG capsule Take 1 capsule (1.5 mg total) by mouth daily. 30 capsule 1   traZODone (DESYREL) 50 MG tablet Take 0.5-1 tablets (25-50 mg total) by mouth at bedtime. 30 tablet 1   buPROPion (WELLBUTRIN XL) 150 MG 24 hr tablet Take 1 tablet (150 mg total) by mouth daily. Take total of 450 mg daily. Along with 300 mg tab 90 tablet 1   buPROPion (WELLBUTRIN XL) 300 MG 24 hr tablet TAKE 300 MG DAILY AND 150 MG DAILY (TOTAL OF 450 MG) 90 tablet 1   busPIRone (BUSPAR) 5 MG tablet Take 1 tablet (5 mg total) by mouth 2 (two) times daily. 180 tablet 1   CINNAMON PO Take 1,000 mg by mouth daily.     cyclobenzaprine (FLEXERIL) 10 MG tablet TAKE 1 TABLET BY MOUTH AT BEDTIME AS NEEDED FOR MUSCLE SPASMS. 30 tablet 1   [START ON 05/24/2022] FLUoxetine  (PROZAC) 40 MG capsule Take 2 capsules (80 mg total) by mouth daily. Fill after 10/30 180 capsule 1   gabapentin (NEURONTIN) 600 MG tablet Take 600 MG (1 tablet) by mouth in the morning, and then 300 MG (1/2 tablet) by mouth at noon, and then 600 MG (1 tablet) by mouth in the evening before bed. 225 tablet 4   lamoTRIgine (LAMICTAL) 25 MG tablet Titration schedule as follows. Lamotrigine MorningEvening Week 1 25mg    Week 2 25mg  25mg  Week 3 50mg  25mg  Week 4 50mg  50mg   Week 5 75mg  50mg  Week 6 75mg  75mg  Week 7 100mg  75mg  Week 8 100mg  100mg  270 tablet 0   lansoprazole (PREVACID) 15 MG capsule Take 15 mg by mouth daily at 12 noon.     LORazepam (ATIVAN) 1 MG tablet Take 1 tablet (1 mg total) by mouth daily as needed for anxiety. 30 tablet 2   metoprolol succinate (TOPROL-XL) 50 MG 24 hr tablet Take 1 tablet (50 mg total) by mouth daily. Take with or immediately following a meal.Take with or immediately following a meal. 90 tablet 4   naproxen (NAPROSYN) 500 MG tablet TAKE 1 TABLET BY MOUTH TWICE A DAY WITH MEALS 180 tablet 3   terbinafine (LAMISIL) 250 MG tablet Take 1 tablet (250 mg total) by mouth daily. 90 tablet 0   valACYclovir (VALTREX) 1000 MG tablet Take 1 tablet (1,000 mg total) by mouth 2 (two) times daily. 60 tablet 4   vitamin B-12 (CYANOCOBALAMIN) 1000 MCG tablet Take 5,000 mcg by mouth daily.      Vitamin D, Cholecalciferol, 1000 UNITS TABS Take 5,000 Units by mouth daily.      No current facility-administered medications for this visit.     Musculoskeletal: Strength & Muscle Tone:  N/A Gait & Station:  N/A Patient leans: N/A  Psychiatric Specialty Exam: Review of Systems  Psychiatric/Behavioral:  Positive for dysphoric mood and sleep disturbance. Negative for agitation, behavioral problems, confusion, decreased concentration, hallucinations, self-injury and suicidal ideas. The patient is nervous/anxious. The patient is not hyperactive.   All other systems reviewed and are  negative.   There were no vitals taken for this visit.There is no height or weight on file to calculate BMI.  General Appearance: Fairly Groomed  Eye Contact:  Good  Speech:  Clear and Coherent  Volume:  Normal  Mood:   sad  Affect:  Appropriate, Congruent, and Tearful  Thought Process:  Coherent  Orientation:  Full (Time, Place, and Person)  Thought Content: Logical   Suicidal Thoughts:  No  Homicidal Thoughts:  No  Memory:  Immediate;   Good  Judgement:  Good  Insight:  Good  Psychomotor Activity:  Normal  Concentration:  Concentration: Good and Attention Span: Good  Recall:  Good  Fund of Knowledge: Good  Language: Good  Akathisia:  No  Handed:  Right  AIMS (if indicated): not done  Assets:  Communication Skills Desire for Improvement  ADL's:  Intact  Cognition: WNL  Sleep:  Poor   Screenings: GAD-7    Flowsheet Row Office Visit from 01/05/2022 in [facility] Office Visit from 11/16/2021 in [facility] Office Visit from 10/26/2021 in [facility] Office Visit from 07/17/2021 in [facility] Office Visit from 07/21/2020 in [facility]  Total GAD-7 Score 21 21 12 21 21       PHQ2-9    Flowsheet Row Office Visit from 01/05/2022 in [facility] Office Visit from 11/16/2021 in [facility] Office Visit from 10/26/2021 in [facility] Office Visit from 08/31/2021 in [facility] Office Visit from 07/17/2021 in [facility]  PHQ-2 Total Score 2 4 2 2 2   PHQ-9 Total Score 11 22 11 10 13       Flowsheet Row Office Visit from 08/31/2021 in [facility] ED from 07/09/2021 in [facility] Video Visit from 11/07/2020 in [facility]  C-SSRS RISK CATEGORY Low Risk No Risk Error: Q3, 4, or 5 should not be populated  when Q2 is No        Assessment and Plan:  Tracie  James is a 51 y.o. year old female with a history of depression, who presents for follow up appointment for below.   1. MDD (major depressive disorder), recurrent episode, mild (HCC) 2. Anxiety She reports depressive symptoms with prominent fatigue. Psychosocial stressors includes losing custody of her 98 year old daughter, and conflict with her ex husband.  She reports good relationship with her boyfriend.  She is willing to try medication adjustment.  Will add Vraylar as adjunctive treatment for depression.  Discussed potential metabolic side effect and EPS.  Will continue bupropion to target depression.  noted that she has been on this medication at least for several months without known side effect/she has strong preference to stay on this medication.  She is aware that this medication to be tapered off if any concern of seizure disorder.  Will continue BuSpar for anxiety. Coached grounding technique. She will continue to see her therapist.   3. Insomnia, unspecified type Worsening in initial and middle insomnia.  She has no known snoring.  Will start trazodone as needed for insomnia.    Plan Continue fluoxetine 80 mg daily (monitor weight gain) Continue Bupropion 450 mg daily Continue Buspar 5 mg twice day  Start Vraylar 1.5 mg daily  Continue lorazepam 1 mg as needed for anxiety - she rarely takes this medication, declined refill Start Trazodone 25-50 mg at night as needed for insomnia Next appointment: 12/1 at 8:30  for 30 mins, video   Past trials of medication: Cymbalta, Zoloft, Prozac, bupropion, Lithium (sick), Depakote (spending money), Latuda, Abilify (did not work), quetiapine (weight gain, somnolence). Geodon (PVC occurred)    I have reviewed suicide assessment in detail. No change in the following assessment.    The patient demonstrates the following risk factors for suicide: Chronic risk factors for suicide include: psychiatric disorder of depression and previous suicide  attempts of overdosing medication. Acute risk factors for suicide include: family or marital conflict. Protective factors for this patient include: responsibility to others (children, family), coping skills and hope for the future. Patient is future oriented and agrees to come for next appointment. Considering these factors, the overall suicide risk at this point appears to be low. She is future oriented, and is amenable to treatment. Patient is appropriate for outpatient follow up.        Collaboration of Care: Collaboration of Care: Other reviewed notes in Epic  Patient/Guardian was advised Release of Information must be obtained prior to any record release in order to collaborate their care with an outside provider. Patient/Guardian was advised if they have not already done so to contact the registration department to sign all necessary forms in order for [place] to release information regarding their care.   Consent: Patient/Guardian gives verbal consent for treatment and assignment of benefits for services provided during this visit. Patient/Guardian expressed understanding and agreed to proceed.    [name], MD 05/07/2022, 9:04 AM

## 2022-05-07 ENCOUNTER — Other Ambulatory Visit: Payer: Self-pay | Admitting: Psychiatry

## 2022-05-07 ENCOUNTER — Telehealth (INDEPENDENT_AMBULATORY_CARE_PROVIDER_SITE_OTHER): Payer: 59 | Admitting: Psychiatry

## 2022-05-07 ENCOUNTER — Encounter: Payer: Self-pay | Admitting: Psychiatry

## 2022-05-07 DIAGNOSIS — F330 Major depressive disorder, recurrent, mild: Secondary | ICD-10-CM | POA: Diagnosis not present

## 2022-05-07 DIAGNOSIS — F419 Anxiety disorder, unspecified: Secondary | ICD-10-CM

## 2022-05-07 DIAGNOSIS — G4700 Insomnia, unspecified: Secondary | ICD-10-CM | POA: Diagnosis not present

## 2022-05-07 MED ORDER — FLUOXETINE HCL 40 MG PO CAPS: 80.0000 mg | ORAL_CAPSULE | Freq: Every day | ORAL | 1 refills | Status: DC

## 2022-05-07 MED ORDER — TRAZODONE HCL 50 MG PO TABS: 25.0000 mg | ORAL_TABLET | Freq: Every day | ORAL | 1 refills | Status: DC

## 2022-05-07 MED ORDER — CARIPRAZINE HCL 1.5 MG PO CAPS: 1.5000 mg | ORAL_CAPSULE | Freq: Every day | ORAL | 1 refills | Status: DC

## 2022-05-07 NOTE — Patient Instructions (Signed)
Continue fluoxetine 80 mg daily  Continue Bupropion 450 mg daily Continue Buspar 5 mg twice day  Start Vraylar 1.5 mg daily  Continue lorazepam 1 mg as needed for anxiety  Start Trazodone 25-50 mg at night as needed for insomnia Next appointment: 12/1 at 8:30

## 2022-05-10 ENCOUNTER — Telehealth: Payer: Self-pay | Admitting: Nurse Practitioner

## 2022-05-10 NOTE — Telephone Encounter (Signed)
Copied from [place] [id number]. Topic: General - Other >> May 10, 2022 12:03 PM [name] wrote: Tracie James with [facility] called to notify office that patient is currently taking buPROPion and that it has a known cause of seizures. Patient currently has a diagnosis of seizures and insurance would like patient to be monitored.

## 2022-05-31 ENCOUNTER — Other Ambulatory Visit: Payer: Self-pay | Admitting: Psychiatry

## 2022-06-16 NOTE — Progress Notes (Signed)
Virtual Visit via Video Note  I connected with Tracie James on 06/18/22 at  8:30 AM EST by a video enabled telemedicine application and verified that I am speaking with the correct person using two identifiers.  Location: Patient: work Provider: office Persons participated in the visit- patient, provider    I discussed the limitations of evaluation and management by telemedicine and the availability of in person appointments. The patient expressed understanding and agreed to proceed.   I discussed the assessment and treatment plan with the patient. The patient was provided an opportunity to ask questions and all were answered. The patient agreed with the plan and demonstrated an understanding of the instructions.   The patient was advised to call back or seek an in-person evaluation if the symptoms worsen or if the condition fails to improve as anticipated.  I provided 20 minutes of non-face-to-face time during this encounter.   Norman Clay, MD    [facility] MD/PA/NP OP Progress Note  06/18/2022 9:08 AM Tracie James  MRN:  [id number]  Chief Complaint:  Chief Complaint  Patient presents with   Follow-up   HPI:  This is a follow-up appointment for depression and anxiety.  She states that there is no change.  She feels that she is just there.  She could not fill Vraylar at the pharmacy.  She had a fair Thanksgiving week at her niece's place.  She reports good relationship with her boyfriend.  She states that her oldest daughter was able to talk with the other daughter, although it was 5 minutes conversation.  She later becomes tearful, stating that she is always like this when she talks about her daughter.  Although she reports some concern of this therapy partly due to difficulty in explaining situation around her daughter, she is planning to contact the back and discuss this.  She hates coming to the work.  She enjoys working on [employer].  She sleeps better after  starting trazodone.  She denies change in appetite.  She denies SI.  She has been taking lorazepam more often, referring to having difficulty around holiday season. She has not had any seizure like activities since June.   Employment: Quarry manager Support: boyfriend of ten years Household: boyfriend of 11 years in 2022 Marital status: divorced Number of children: 2 daughters, oldest is 53 year old, will move to [place]  12/2021 MPRESSION: This 3 hour intermittently monitored video EEG in the awake and asleep states is within normal limits.   Visit Diagnosis:    ICD-10-CM   1. MDD (major depressive disorder), recurrent episode, mild (HCC)  F33.0 EKG 12-Lead    2. Anxiety  F41.9 LORazepam (ATIVAN) 1 MG tablet    3. Insomnia, unspecified type  G47.00       Past Psychiatric History: Please see initial evaluation for full details. I have reviewed the history. No updates at this time.     Past Medical History:  Past Medical History:  Diagnosis Date   Anxiety    Asthma    Bipolar 2 disorder ([place])    Depression    GERD (gastroesophageal reflux disease)    Palpitations    PVC (premature ventricular contraction)     Past Surgical History:  Procedure Laterality Date   ABDOMINAL HYSTERECTOMY     patient is not sure if it was total or complete   APPENDECTOMY      Family Psychiatric History: Please see initial evaluation for full details. I have reviewed the history.  No updates at this time.     Family History:  Family History  Problem Relation Age of Onset   Depression Mother    Multiple sclerosis Mother    Schizophrenia Maternal Grandmother    Heart disease Maternal Grandmother        aortic valve replacement    Social History:  Social History   Socioeconomic History   Marital status: Divorced    Spouse name: Not on file   Number of children: Not on file   Years of education: Not on file   Highest education level: Not on file  Occupational History   Not on file  Tobacco  Use   Smoking status: Former    Types: Cigarettes    Quit date: 03/31/2014    Years since quitting: 8.2   Smokeless tobacco: Never  Vaping Use   Vaping Use: Never used  Substance and Sexual Activity   Alcohol use: No   Drug use: No   Sexual activity: Yes    Partners: Male    Birth control/protection: Surgical  Other Topics Concern   Not on file  Social History Narrative   Not on file   Social Determinants of Health   Financial Resource Strain: Not on file  Food Insecurity: Not on file  Transportation Needs: Not on file  Physical Activity: Not on file  Stress: Not on file  Social Connections: Not on file    Allergies:  Allergies  Allergen Reactions   Erythromycin Hives   Tetracyclines & Related Hives    Metabolic Disorder Labs: No results found for: "HGBA1C", "MPG" No results found for: "PROLACTIN" No results found for: "CHOL", "TRIG", "HDL", "CHOLHDL", "VLDL", "LDLCALC" Lab Results  Component Value Date   TSH 1.720 10/26/2021   TSH 1.53 12/30/2020    Therapeutic Level Labs: No results found for: "LITHIUM" No results found for: "VALPROATE" No results found for: "CBMZ"  Current Medications: Current Outpatient Medications  Medication Sig Dispense Refill   buPROPion (WELLBUTRIN XL) 150 MG 24 hr tablet Take 1 tablet (150 mg total) by mouth daily. Take total of 450 mg daily. Along with 300 mg tab 90 tablet 1   buPROPion (WELLBUTRIN XL) 300 MG 24 hr tablet TAKE 300 MG DAILY AND 150 MG DAILY (TOTAL OF 450 MG) 90 tablet 1   busPIRone (BUSPAR) 5 MG tablet Take 1 tablet (5 mg total) by mouth 2 (two) times daily. 180 tablet 1   cariprazine (VRAYLAR) 1.5 MG capsule Take 1 capsule (1.5 mg total) by mouth daily. 30 capsule 1   CINNAMON PO Take 1,000 mg by mouth daily.     cyclobenzaprine (FLEXERIL) 10 MG tablet TAKE 1 TABLET BY MOUTH AT BEDTIME AS NEEDED FOR MUSCLE SPASMS. 30 tablet 1   FLUoxetine (PROZAC) 40 MG capsule Take 2 capsules (80 mg total) by mouth daily. Fill  after 10/30 180 capsule 1   gabapentin (NEURONTIN) 600 MG tablet Take 600 MG (1 tablet) by mouth in the morning, and then 300 MG (1/2 tablet) by mouth at noon, and then 600 MG (1 tablet) by mouth in the evening before bed. 225 tablet 4   lamoTRIgine (LAMICTAL) 25 MG tablet Titration schedule as follows. Lamotrigine MorningEvening Week 1 25mg    Week 2 25mg  25mg  Week 3 50mg  25mg  Week 4 50mg  50mg  Week 5 75mg  50mg  Week 6 75mg  75mg  Week 7 100mg  75mg  Week 8 100mg  100mg  270 tablet 0   lansoprazole (PREVACID) 15 MG capsule Take 15 mg by mouth daily at 12  noon.     LORazepam (ATIVAN) 1 MG tablet Take 1 tablet (1 mg total) by mouth daily as needed for anxiety. 30 tablet 2   metoprolol succinate (TOPROL-XL) 50 MG 24 hr tablet Take 1 tablet (50 mg total) by mouth daily. Take with or immediately following a meal.Take with or immediately following a meal. 90 tablet 4   naproxen (NAPROSYN) 500 MG tablet TAKE 1 TABLET BY MOUTH TWICE A DAY WITH MEALS 180 tablet 3   terbinafine (LAMISIL) 250 MG tablet Take 1 tablet (250 mg total) by mouth daily. 90 tablet 0   [START ON 07/07/2022] traZODone (DESYREL) 50 MG tablet Take 0.5-1 tablets (25-50 mg total) by mouth at bedtime. 90 tablet 0   valACYclovir (VALTREX) 1000 MG tablet Take 1 tablet (1,000 mg total) by mouth 2 (two) times daily. 60 tablet 4   vitamin B-12 (CYANOCOBALAMIN) 1000 MCG tablet Take 5,000 mcg by mouth daily.      Vitamin D, Cholecalciferol, 1000 UNITS TABS Take 5,000 Units by mouth daily.      No current facility-administered medications for this visit.     Musculoskeletal: Strength & Muscle Tone:  N/A Gait & Station:  N/A Patient leans: N/A  Psychiatric Specialty Exam: Review of Systems  Psychiatric/Behavioral:  Positive for decreased concentration, dysphoric mood and sleep disturbance. Negative for agitation, behavioral problems, confusion, hallucinations, self-injury and suicidal ideas. The patient is nervous/anxious. The patient is not  hyperactive.   All other systems reviewed and are negative.   There were no vitals taken for this visit.There is no height or weight on file to calculate BMI.  General Appearance: Fairly Groomed  Eye Contact:  Good  Speech:  Clear and Coherent  Volume:  Normal  Mood:  Depressed  Affect:  Appropriate, Congruent, and Tearful  Thought Process:  Coherent  Orientation:  Full (Time, Place, and Person)  Thought Content: Logical   Suicidal Thoughts:  No  Homicidal Thoughts:  No  Memory:  Immediate;   Good  Judgement:  Good  Insight:  Good  Psychomotor Activity:  Normal  Concentration:  Concentration: Good and Attention Span: Good  Recall:  Good  Fund of Knowledge: Good  Language: Good  Akathisia:  No  Handed:  Right  AIMS (if indicated): not done  Assets:  Communication Skills Desire for Improvement  ADL's:  Intact  Cognition: WNL  Sleep:  Good   Screenings: GAD-7    Flowsheet Row Office Visit from 01/05/2022 in [facility] Visit from 11/16/2021 in [facility] Visit from 10/26/2021 in [facility] Visit from 07/17/2021 in [facility] Visit from 07/21/2020 in [facility]  Total GAD-7 Score 21 21 12 21 21       PHQ2-9    [place] Office Visit from 01/05/2022 in [facility] Visit from 11/16/2021 in [facility] Visit from 10/26/2021 in [facility] Visit from 08/31/2021 in [facility] Office Visit from 07/17/2021 in [facility]  PHQ-2 Total Score 2 4 2 2 2   PHQ-9 Total Score 11 22 11 10 13       [place] Office Visit from 08/31/2021 in [facility] ED from 07/09/2021 in [facility] Video Visit from 11/07/2020 in [facility] No Risk Error: Q3, 4, or 5 should not be populated when  Q2 is No  Assessment and Plan:  Gretta Polin is a 51 y.o. year old female with a history of  depression, history of PVC, r/o seizure (not confirmed on 72 hour EEG), who presents for follow up appointment for below.   1. MDD (major depressive disorder), recurrent episode, mild ([place]) 2. Anxiety Exam is notable for tearfulness in talking about her daughter.  She continues to report depressive symptoms with prominent fatigue. Psychosocial stressors includes losing custody of her 73 year old daughter, and conflict with her ex husband.  She reports good relationship with her boyfriend.  She was unable to fill Vraylar. Will take a look into this.  She agrees to try Risperdal for depression if Arman Filter was not approved.  Discussed potential metabolic side effect, EPS.  Will continue bupropion to target depression. Noted that she has been on this medication at least for several months without known side effect/she has strong preference to stay on this medication.  Will continue BuSpar for anxiety.  Will continue lorazepam as needed for anxiety.   3. Insomnia, unspecified type Improving after starting trazodone.  Will continue current dose to target insomnia.    Plan Continue fluoxetine 80 mg daily (monitor weight gain) Continue Bupropion 450 mg daily Continue Buspar 5 mg twice day  Obtain EKG Start Vraylar 1.5 mg daily (she agreed to try Risperidone if vraylar is not approved) after obtain EKG Continue lorazepam 1 mg as needed for anxiety - she rarely takes this medication Continue Trazodone 25-50 mg at night as needed for insomnia Next appointment: 1/19 at 8 AM  for 30 mins, video - sleep study was not conclusive for sleep apnea in July 2023   Past trials of medication: Cymbalta, Zoloft, Prozac, bupropion, Lithium (sick), Depakote (spending money), Latuda, Abilify (did not work), quetiapine (weight gain, somnolence). Geodon (PVC occurred)     I have reviewed suicide assessment in  detail. No change in the following assessment.    The patient demonstrates the following risk factors for suicide: Chronic risk factors for suicide include: psychiatric disorder of depression and previous suicide attempts of overdosing medication. Acute risk factors for suicide include: family or marital conflict. Protective factors for this patient include: responsibility to others (children, family), coping skills and hope for the future. Patient is future oriented and agrees to come for next appointment. Considering these factors, the overall suicide risk at this point appears to be low. She is future oriented, and is amenable to treatment. Patient is appropriate for outpatient follow up.          Collaboration of Care: Collaboration of Care: Other reviewed notes in Epic  Patient/Guardian was advised Release of Information must be obtained prior to any record release in order to collaborate their care with an outside provider. Patient/Guardian was advised if they have not already done so to contact the registration department to sign all necessary forms in order for [place] to release information regarding their care.   Consent: Patient/Guardian gives verbal consent for treatment and assignment of benefits for services provided during this visit. Patient/Guardian expressed understanding and agreed to proceed.    Norman Clay, MD 06/18/2022, 9:08 AM

## 2022-06-18 ENCOUNTER — Telehealth (INDEPENDENT_AMBULATORY_CARE_PROVIDER_SITE_OTHER): Payer: 59 | Admitting: Psychiatry

## 2022-06-18 ENCOUNTER — Encounter: Payer: Self-pay | Admitting: Psychiatry

## 2022-06-18 DIAGNOSIS — G4700 Insomnia, unspecified: Secondary | ICD-10-CM

## 2022-06-18 DIAGNOSIS — F419 Anxiety disorder, unspecified: Secondary | ICD-10-CM

## 2022-06-18 DIAGNOSIS — F330 Major depressive disorder, recurrent, mild: Secondary | ICD-10-CM | POA: Diagnosis not present

## 2022-06-18 MED ORDER — TRAZODONE HCL 50 MG PO TABS: 25.0000 mg | ORAL_TABLET | Freq: Every day | ORAL | 0 refills | Status: DC

## 2022-06-18 MED ORDER — LORAZEPAM 1 MG PO TABS: 1.0000 mg | ORAL_TABLET | Freq: Every day | ORAL | 2 refills | Status: DC | PRN

## 2022-06-18 NOTE — Patient Instructions (Signed)
Continue fluoxetine 80 mg daily (monitor weight gain) Continue Bupropion 450 mg daily Continue Buspar 5 mg twice day  Start Vraylar 1.5 mg daily (she agreed to try Risperidone if vraylar is not approved) Continue lorazepam 1 mg as needed for anxiety - she rarely takes this medication Continue Trazodone 25-50 mg at night as needed for insomnia Next appointment: 1/19 at 8 AM

## 2022-06-22 ENCOUNTER — Ambulatory Visit
Admission: RE | Admit: 2022-06-22 | Discharge: 2022-06-22 | Disposition: A | Discharge status: Home / Self Care | Payer: 59 | Source: Ambulatory Visit | Attending: Psychiatry | Admitting: Psychiatry

## 2022-06-22 DIAGNOSIS — F419 Anxiety disorder, unspecified: Secondary | ICD-10-CM

## 2022-06-22 DIAGNOSIS — F330 Major depressive disorder, recurrent, mild: Secondary | ICD-10-CM | POA: Diagnosis present

## 2022-06-22 DIAGNOSIS — G4700 Insomnia, unspecified: Secondary | ICD-10-CM

## 2022-06-23 ENCOUNTER — Telehealth: Payer: Self-pay | Admitting: Psychiatry

## 2022-06-23 NOTE — Telephone Encounter (Signed)
Reviewed EKG. QTc 468 msec. It is within acceptable range.  - Please contact the pharmacy as she had difficulty in getting fill Vraylar.  - Please contact the patient to start vraylar once it is available to fill at the pharmacy.

## 2022-06-23 NOTE — Progress Notes (Signed)
Please contact her and inform that ECG is within acceptable range. Please advise her to start Vraylar.

## 2022-06-23 NOTE — Telephone Encounter (Signed)
Left message that EKG was within normal limits

## 2022-06-28 ENCOUNTER — Encounter: Payer: Self-pay | Admitting: Cardiology

## 2022-06-28 ENCOUNTER — Ambulatory Visit: Payer: 59 | Attending: Cardiology | Admitting: Cardiology

## 2022-06-28 VITALS — BP 126/82 | HR 66 | Ht 64.0 in | Wt 225.4 lb

## 2022-06-28 DIAGNOSIS — G40909 Epilepsy, unspecified, not intractable, without status epilepticus: Secondary | ICD-10-CM | POA: Diagnosis not present

## 2022-06-28 DIAGNOSIS — Z6837 Body mass index (BMI) 37.0-37.9, adult: Secondary | ICD-10-CM | POA: Diagnosis not present

## 2022-06-28 DIAGNOSIS — R002 Palpitations: Secondary | ICD-10-CM | POA: Diagnosis not present

## 2022-06-28 DIAGNOSIS — K2100 Gastro-esophageal reflux disease with esophagitis, without bleeding: Secondary | ICD-10-CM | POA: Diagnosis not present

## 2022-06-28 MED ORDER — METOPROLOL SUCCINATE ER 50 MG PO TB24: 50.0000 mg | ORAL_TABLET | Freq: Every day | ORAL | 2 refills | Status: DC

## 2022-06-28 NOTE — Progress Notes (Signed)
Cardiology Clinic Note   Patient Name: Tracie James Date of Encounter: 06/28/2022  Primary Care Provider:  Marjie Skiffannady, Jolene T, NP Primary Cardiologist:  Debbe OdeaBrian Agbor-Etang, MD  Patient Profile    51 year old female with past medical history of anxiety, PVCs, former smoker x 20 years, fatigue, seizures, and palpitations, who presents today for follow-up.  Past Medical History    Past Medical History:  Diagnosis Date   Anxiety    Asthma    Bipolar 2 disorder (HCC)    Depression    GERD (gastroesophageal reflux disease)    Palpitations    PVC (premature ventricular contraction)    Past Surgical History:  Procedure Laterality Date   ABDOMINAL HYSTERECTOMY     patient is not sure if it was total or complete   APPENDECTOMY      Allergies  Allergies  Allergen Reactions   Erythromycin Hives   Tetracyclines & Related Hives    History of Present Illness    Tracie James is a 51 year old female with a previously mentioned past medical history of anxiety/depression, PVCs, former smoker x 20 years, fatigue, seizures,  and palpitations.  Patient was recently diagnosed with frequent PVCs in 2009 and developed a monitor placed due to symptoms of palpitations.  She was in [place] at that time.  Echocardiogram was obtained which patient states was normal findings.  [facility] with symptoms of palpitations persisted.  Primary care provider started on Toprol-XL.  Some mild metoprolol follow-up of her drug list approximately 6 months ago and she started having symptoms of palpitations again.  6 weeks ago metoprolol was daily restarted with resolution of her symptoms.  She had no other cardiac symptoms at that time.  [facility]24/21 after she had a witnessed seizure.  CT of the head showed no evidence of hemorrhage, ischemia, edema, mass, and no clear acute process.  CMP was remarkable for serum creatinine of 1.3  but no other significant electrolyte or metabolic derangements were noted.  She did appear to be slightly dehydrated and IV fluids were offered but the patient declined.  She was discharged in stable condition and was advised she needed to follow-up with neurology.  She returns to clinic today stating that she has been doing well.  She denies any chest pain, shortness of breath, palpitations, peripheral edema.  She states she did have 1 hospitalization after having a seizure but evaluation was unrevealing.  She also states that she has weaned herself off of Lamictal and has stopped taking gabapentin due to cost.  She has not had any further seizure activity.  Denies any other recent hospitalizations or visits to the emergency department.    Home Medications    Current Outpatient Medications  Medication Sig Dispense Refill   b complex vitamins capsule Take 1 capsule by mouth daily.     buPROPion (WELLBUTRIN XL) 150 MG 24 hr tablet Take 1 tablet (150 mg total) by mouth daily. Take total of 450 mg daily. Along with 300 mg tab 90 tablet 1   buPROPion (WELLBUTRIN XL) 300 MG 24 hr tablet TAKE 300 MG DAILY AND 150 MG DAILY (TOTAL OF 450 MG) 90 tablet 1   busPIRone (BUSPAR) 5 MG tablet Take 1 tablet (5 mg total) by mouth 2 (two) times daily. 180 tablet 1   CINNAMON PO Take 1,000 mg by mouth daily.     Coenzyme Q10 (COQ-10 PO) Take 1 capsule  by mouth daily.     cyclobenzaprine (FLEXERIL) 10 MG tablet TAKE 1 TABLET BY MOUTH AT BEDTIME AS NEEDED FOR MUSCLE SPASMS. 30 tablet 1   FLUoxetine (PROZAC) 40 MG capsule Take 2 capsules (80 mg total) by mouth daily. Fill after 10/30 180 capsule 1   lansoprazole (PREVACID) 15 MG capsule Take 15 mg by mouth daily at 12 noon.     LORazepam (ATIVAN) 1 MG tablet Take 1 tablet (1 mg total) by mouth daily as needed for anxiety. 30 tablet 2   naproxen (NAPROSYN) 500 MG tablet TAKE 1 TABLET BY MOUTH TWICE A DAY WITH MEALS 180 tablet 3   [START ON 07/07/2022] traZODone  (DESYREL) 50 MG tablet Take 0.5-1 tablets (25-50 mg total) by mouth at bedtime. 90 tablet 0   Vitamin D, Cholecalciferol, 1000 UNITS TABS Take 5,000 Units by mouth daily.      cariprazine (VRAYLAR) 1.5 MG capsule Take 1 capsule (1.5 mg total) by mouth daily. (Patient not taking: Reported on 06/28/2022) 30 capsule 1   gabapentin (NEURONTIN) 600 MG tablet Take 600 MG (1 tablet) by mouth in the morning, and then 300 MG (1/2 tablet) by mouth at noon, and then 600 MG (1 tablet) by mouth in the evening before bed. (Patient not taking: Reported on 06/28/2022) 225 tablet 4   lamoTRIgine (LAMICTAL) 25 MG tablet Titration schedule as follows. Lamotrigine MorningEvening Week 1 25mg    Week 2 25mg  25mg  Week 3 50mg  25mg  Week 4 50mg  50mg  Week 5 75mg  50mg  Week 6 75mg  75mg  Week 7 100mg  75mg  Week 8 100mg  100mg  (Patient not taking: Reported on 06/28/2022) 270 tablet 0   metoprolol succinate (TOPROL-XL) 50 MG 24 hr tablet Take 1 tablet (50 mg total) by mouth daily. Take with or immediately following a meal.Take with or immediately following a meal. 90 tablet 2   terbinafine (LAMISIL) 250 MG tablet Take 1 tablet (250 mg total) by mouth daily. (Patient not taking: Reported on 06/28/2022) 90 tablet 0   valACYclovir (VALTREX) 1000 MG tablet Take 1 tablet (1,000 mg total) by mouth 2 (two) times daily. (Patient not taking: Reported on 06/28/2022) 60 tablet 4   vitamin B-12 (CYANOCOBALAMIN) 1000 MCG tablet Take 5,000 mcg by mouth daily.  (Patient not taking: Reported on 06/28/2022)     No current facility-administered medications for this visit.     Family History    Family History  Problem Relation Age of Onset   Depression Mother    Multiple sclerosis Mother    Schizophrenia Maternal Grandmother    Heart disease Maternal Grandmother        aortic valve replacement   She indicated that her mother is deceased. She indicated that her father is alive. She indicated that her brother is alive. She indicated that  her maternal grandmother is deceased. She indicated that her maternal grandfather is deceased. She indicated that her paternal grandmother is deceased. She indicated that her paternal grandfather is deceased. She indicated that both of her daughters are alive.  Social History    Social History   Socioeconomic History   Marital status: Divorced    Spouse name: Not on file   Number of children: Not on file   Years of education: Not on file   Highest education level: Not on file  Occupational History   Not on file  Tobacco Use   Smoking status: Former    Types: Cigarettes    Quit date: 03/31/2014    Years since quitting: 8.2  Smokeless tobacco: Never  Vaping Use   Vaping Use: Never used  Substance and Sexual Activity   Alcohol use: No   Drug use: No   Sexual activity: Yes    Partners: Male    Birth control/protection: Surgical  Other Topics Concern   Not on file  Social History Narrative   Not on file   Social Determinants of Health   Financial Resource Strain: Not on file  Food Insecurity: Not on file  Transportation Needs: Not on file  Physical Activity: Not on file  Stress: Not on file  Social Connections: Not on file  Intimate Partner Violence: Not on file     Review of Systems    General:  No chills, fever, night sweats or weight changes.  Cardiovascular:  No chest pain, dyspnea on exertion, edema, orthopnea, palpitations, paroxysmal nocturnal dyspnea. Dermatological: No rash, lesions/masses Respiratory: No cough, dyspnea Urologic: No hematuria, dysuria Abdominal:   No nausea, vomiting, diarrhea, bright red blood per rectum, melena, or hematemesis Neurologic:  No visual changes, wkns, changes in mental status. All other systems reviewed and are otherwise negative except as noted above.   Physical Exam    VS:  BP 126/82 (BP Location: Right Arm, Patient Position: Sitting, Cuff Size: Large)   Pulse 66   Ht 5\' 4"  (1.626 m)   Wt 225 lb 6.4 oz (102.2 kg)   LMP   (LMP Unknown)   SpO2 97%   BMI 38.69 kg/m  , BMI Body mass index is 38.69 kg/m.     GEN: Well nourished, well developed, in no acute distress. HEENT: normal. Neck: Supple, no JVD, carotid bruits, or masses. Cardiac: RRR, no murmurs, rubs, or gallops. No clubbing, cyanosis, edema.  Radials 2+/PT 2+ and equal bilaterally.  Respiratory:  Respirations regular and unlabored, clear to auscultation bilaterally. GI: Soft, nontender, nondistended, BS + x 4. MS: no deformity or atrophy. Skin: warm and dry, no rash. Neuro:  Strength and sensation are intact. Psych: Normal affect.  Accessory Clinical Findings    ECG personally reviewed by me today-no EKG done at visit today she did previously pleated on 06/22/2022 that revealed sinus rhythm with first-degree AV block that is new  Lab Results  Component Value Date   WBC 7.6 10/26/2021   HGB 11.7 10/26/2021   HCT 34.5 10/26/2021   MCV 83 10/26/2021   PLT 278 10/26/2021   Lab Results  Component Value Date   CREATININE 1.03 (H) 10/26/2021   BUN 16 10/26/2021   NA 139 10/26/2021   K 3.9 10/26/2021   CL 105 10/26/2021   CO2 21 10/26/2021   Lab Results  Component Value Date   ALT 31 10/26/2021   AST 31 10/26/2021   ALKPHOS 94 10/26/2021   BILITOT <0.2 10/26/2021   No results found for: "CHOL", "HDL", "LDLCALC", "LDLDIRECT", "TRIG", "CHOLHDL"  No results found for: "HGBA1C"  Assessment & Plan   1.  History of palpitations and PVCs that symptoms resolved taking Toprol-XL 50 mg daily.  Unfortunately it been greater than a year since her last visit so she needs refill sent in on metoprolol to the pharmacy of choice she is requesting in 90 days.  As she is asymptomatic there is no further need for testing at this time.  2.  Obesity with recent weight loss.  Unfortunately she states that the weight loss came from weaning herself off of her seizure medication of Lamictal.  Unsure of how long she has actually been off of the  current  medication.  4.  Seizure disorder which required emergency department visit earlier in the year.  Previously had been on Lamictal and was following with neurology.  Patient states that she has been released from driving restriction.  5.  History of gastroesophageal reflux disease continued on PPI continues to be managed by PCP  6.  Disposition patient return to clinic to see MD/APP in 1 year or sooner if needed  Nasim Habeeb, NP 06/28/2022, 4:17 PM

## 2022-06-28 NOTE — Patient Instructions (Signed)
Medication Instructions:  Your physician recommends that you continue on your current medications as directed. Please refer to the Current Medication list given to you today.  *If you need a refill on your cardiac medications before your next appointment, please call your pharmacy*   Lab Work: None ordered  If you have labs (blood work) drawn today and your tests are completely normal, you will receive your results only by: MyChart Message (if you have MyChart) OR A paper copy in the mail If you have any lab test that is abnormal or we need to change your treatment, we will call you to review the results.   Testing/Procedures: None ordered  Follow-Up: At [facility], you and your health needs are our priority.  As part of our continuing mission to provide you with exceptional heart care, we have created designated Provider Care Teams.  These Care Teams include your primary Cardiologist (physician) and Advanced Practice Providers (APPs -  Physician Assistants and Nurse Practitioners) who all work together to provide you with the care you need, when you need it.  We recommend signing up for the patient portal called "MyChart".  Sign up information is provided on this After Visit Summary.  MyChart is used to connect with patients for Virtual Visits (Telemedicine).  Patients are able to view lab/test results, encounter notes, upcoming appointments, etc.  Non-urgent messages can be sent to your provider as well.   To learn more about what you can do with MyChart, go to [email or web address].    Your next appointment:   1 year(s)  The format for your next appointment:   In Person  Provider:   You may see Debbe Odea, MD or one of the following Advanced Practice Providers on your designated Care Team:   Nicolasa Ducking, NP Eula Listen, PA-C Cadence Fransico Michael, PA-C Charlsie Quest, NP   Important Information About Sugar

## 2022-07-01 ENCOUNTER — Telehealth: Payer: Self-pay

## 2022-07-01 NOTE — Telephone Encounter (Signed)
Order was sent as in Epic. Could you contact the pharmacy, it could be related to issues with prior authorization.

## 2022-07-01 NOTE — Progress Notes (Signed)
This was already done

## 2022-07-01 NOTE — Telephone Encounter (Signed)
pt states that the pharmacy still has not gotten the vraylar. [facility] [place].

## 2022-07-05 ENCOUNTER — Other Ambulatory Visit: Payer: Self-pay | Admitting: Psychiatry

## 2022-07-05 ENCOUNTER — Telehealth: Payer: Self-pay

## 2022-07-05 MED ORDER — CARIPRAZINE HCL 1.5 MG PO CAPS: 1.5000 mg | ORAL_CAPSULE | Freq: Every day | ORAL | 1 refills | Status: DC

## 2022-07-05 NOTE — Telephone Encounter (Signed)
PA initiated for Vraylar 1.5mg  capsules via covermymeds Approved 07/05/22----07/06/23

## 2022-07-05 NOTE — Telephone Encounter (Signed)
Medication was ordered. Thanks.

## 2022-07-05 NOTE — Telephone Encounter (Signed)
Medication Management - Telephone call with patient, after speaking [facility] in [place] and then calling [employer] to fill a prior authorization for coverage of her prescribed Vraylar. Informed this medication was denied previously so gave [employer] additional information to try to get medication approved.  Informed review could take from 24-72 hours and then we should get a fax decision back at Dr. Bing Matter office.  Informed would keep collateral informed and requested she check back with our office by the end of the week if nothing heard and patient agreed with plan. Contacted [employer] at [phone] and provided pt's ID [id number].

## 2022-07-07 ENCOUNTER — Encounter: Payer: Self-pay | Admitting: Nurse Practitioner

## 2022-07-22 ENCOUNTER — Ambulatory Visit (INDEPENDENT_AMBULATORY_CARE_PROVIDER_SITE_OTHER): Payer: 59 | Admitting: Behavioral Health

## 2022-07-22 DIAGNOSIS — F419 Anxiety disorder, unspecified: Secondary | ICD-10-CM

## 2022-07-22 DIAGNOSIS — F3181 Bipolar II disorder: Secondary | ICD-10-CM | POA: Diagnosis not present

## 2022-07-22 NOTE — Progress Notes (Signed)
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 [employer]   Treatment Type: Individual Therapy  Reported Symptoms: Elevated anx/dep & stressors w/Family situation  Mental Status Exam: Appearance:  Casual     Behavior: Appropriate and Sharing  Motor: Normal  Speech/Language:  Clear and Coherent  Affect: Appropriate  Mood: normal  Thought process: normal  Thought content:   WNL  Sensory/Perceptual disturbances:   WNL  Orientation: oriented to person, place, and time/date  Attention: Good  Concentration: Good  Memory: WNL  Fund of knowledge:  Good  Insight:   Good  Judgment:  Good  Impulse Control: Good   Risk Assessment: Danger to Self:  No Self-injurious Behavior: No Danger to Others: No Duty to Warn:no Physical Aggression / Violence:No  Access to Firearms a concern: No  Gang Involvement:No   Subjective: Pt is upset & fearful she will never speak to her younger Tracie James again. Her older Tracie James spoke w/her on her Tracie James on Nov 9th. They have not spoken since.   Interventions: Family Systems  Diagnosis:Bipolar II disorder Tracie James)  Anxiety  Plan: Tracie James is upset over her Dtr Tracie James's absence from Pt's home & lack of contact w/Tracie James. She will encourage her Dtr Tracie James to speak w/her Tracie in [facility] monthly so she can encourage her in [employer] efforts.  Target Date: 08/22/2022  Progress: 0  Frequency: Twice monthly  Modality: Tracie Reaper, LMFT

## 2022-07-22 NOTE — Progress Notes (Signed)
                Tracie Andreoni L Iszabella Hebenstreit, LMFT 

## 2022-08-03 NOTE — Progress Notes (Signed)
Virtual Visit via Video Note  I connected with Tracie James on 08/06/22 at  8:00 AM EST by a video enabled telemedicine application and verified that I am speaking with the correct person using two identifiers.  Location: Patient: work Provider: office Persons participated in the visit- patient, provider    I discussed the limitations of evaluation and management by telemedicine and the availability of in person appointments. The patient expressed understanding and agreed to proceed.     I discussed the assessment and treatment plan with the patient. The patient was provided an opportunity to ask questions and all were answered. The patient agreed with the plan and demonstrated an understanding of the instructions.   The patient was advised to call back or seek an in-person evaluation if the symptoms worsen or if the condition fails to improve as anticipated.  I provided 24 minutes of non-face-to-face time during this encounter.   Norman Clay, MD    [place] MD/PA/NP OP Progress Note  08/06/2022 8:37 AM Tracie James  MRN:  [id number]  Chief Complaint:  Chief Complaint  Patient presents with   Follow-up   HPI:  This is a follow-up appointment for depression.  She states that she has been doing terrible.  She cannot function due to significant issues with concentration.  She has made a mistake, and had turned the in resignation.  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  She is mad at herself of making stupid mistake.  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 She is afraid of making another mistake.  She does not want to kill anybody by her mistake.  She thinks she has started to notice difference in her mood after starting Vraylar.  There are some days she does not feel depressed like today.  She has better quality of sleep since being on trazodone without drowsiness.  She has decrease in appetite.  She has passive SI,  although she denies any intent or plan.  She agrees to contact emergency resources if any worsening.  She denies alcohol use or drug use.  She is willing to try medication adjustment as been all.    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 MDD (major depressive disorder), recurrent episode, moderate (HCC)  F33.1     2. Anxiety  F41.9     3. Insomnia, unspecified type  G47.00       Past Psychiatric History: Please see initial evaluation for full details. I have reviewed the history. No updates at this time.     Past Medical History:  Past Medical History:  Diagnosis Date   Anxiety    Asthma    Bipolar 2 disorder ([place])    Depression    GERD (gastroesophageal reflux disease)    Palpitations    PVC (premature ventricular contraction)     Past Surgical History:  Procedure Laterality Date   ABDOMINAL HYSTERECTOMY     patient is not sure if it was total or complete   APPENDECTOMY  Family Psychiatric History: Please see initial evaluation for full details. I have reviewed the history. No updates at this time.     Family History:  Family History  Problem Relation Age of Onset   Depression Mother    Multiple sclerosis Mother    Schizophrenia Maternal Grandmother    Heart disease Maternal Grandmother        aortic valve replacement    Social History:  Social History   Socioeconomic History   Marital status: Divorced    Spouse name: Not on file   Number of children: Not on file   Years of education: Not on file   Highest education level: Not on file  Occupational History   Not on file  Tobacco Use   Smoking status: Former     Types: Cigarettes    Quit date: 03/31/2014    Years since quitting: 8.3   Smokeless tobacco: Never  Vaping Use   Vaping Use: Never used  Substance and Sexual Activity   Alcohol use: No   Drug use: No   Sexual activity: Yes    Partners: Male    Birth control/protection: Surgical  Other Topics Concern   Not on file  Social History Narrative   Not on file   Social Determinants of Health   Financial Resource Strain: Not on file  Food Insecurity: Not on file  Transportation Needs: Not on file  Physical Activity: Not on file  Stress: Not on file  Social Connections: Not on file    Allergies:  Allergies  Allergen Reactions   Erythromycin Hives   Tetracyclines & Related Hives    Metabolic Disorder Labs: No results found for: "HGBA1C", "MPG" No results found for: "PROLACTIN" No results found for: "CHOL", "TRIG", "HDL", "CHOLHDL", "VLDL", "LDLCALC" Lab Results  Component Value Date   TSH 1.720 10/26/2021   TSH 1.53 12/30/2020    Therapeutic Level Labs: No results found for: "LITHIUM" No results found for: "VALPROATE" No results found for: "CBMZ"  Current Medications: Current Outpatient Medications  Medication Sig Dispense Refill   b complex vitamins capsule Take 1 capsule by mouth daily.     buPROPion (WELLBUTRIN XL) 150 MG 24 hr tablet Take 1 tablet (150 mg total) by mouth daily. Take total of 450 mg daily. Along with 300 mg tab 90 tablet 1   buPROPion (WELLBUTRIN XL) 300 MG 24 hr tablet TAKE 300 MG DAILY AND 150 MG DAILY (TOTAL OF 450 MG) 90 tablet 1   busPIRone (BUSPAR) 5 MG tablet Take 1 tablet (5 mg total) by mouth 2 (two) times daily. 180 tablet 1   cariprazine (VRAYLAR) 1.5 MG capsule Take 1 capsule (1.5 mg total) by mouth daily. 30 capsule 1   CINNAMON PO Take 1,000 mg by mouth daily.     Coenzyme Q10 (COQ-10 PO) Take 1 capsule by mouth daily.     cyclobenzaprine (FLEXERIL) 10 MG tablet TAKE 1 TABLET BY MOUTH AT BEDTIME AS NEEDED FOR MUSCLE SPASMS. 30 tablet 1    FLUoxetine (PROZAC) 40 MG capsule Take 2 capsules (80 mg total) by mouth daily. Fill after 10/30 180 capsule 1   gabapentin (NEURONTIN) 600 MG tablet Take 600 MG (1 tablet) by mouth in the morning, and then 300 MG (1/2 tablet) by mouth at noon, and then 600 MG (1 tablet) by mouth in the evening before bed. (Patient not taking: Reported on 06/28/2022) 225 tablet 4   lansoprazole (PREVACID) 15 MG capsule Take 15 mg by mouth daily at 12  noon.     LORazepam (ATIVAN) 1 MG tablet Take 1 tablet (1 mg total) by mouth daily as needed for anxiety. 30 tablet 2   metoprolol succinate (TOPROL-XL) 50 MG 24 hr tablet Take 1 tablet (50 mg total) by mouth daily. Take with or immediately following a meal.Take with or immediately following a meal. 90 tablet 2   naproxen (NAPROSYN) 500 MG tablet TAKE 1 TABLET BY MOUTH TWICE A DAY WITH MEALS 180 tablet 3   terbinafine (LAMISIL) 250 MG tablet Take 1 tablet (250 mg total) by mouth daily. (Patient not taking: Reported on 06/28/2022) 90 tablet 0   traZODone (DESYREL) 50 MG tablet Take 0.5-1 tablets (25-50 mg total) by mouth at bedtime. 90 tablet 0   valACYclovir (VALTREX) 1000 MG tablet Take 1 tablet (1,000 mg total) by mouth 2 (two) times daily. (Patient not taking: Reported on 06/28/2022) 60 tablet 4   vitamin B-12 (CYANOCOBALAMIN) 1000 MCG tablet Take 5,000 mcg by mouth daily.  (Patient not taking: Reported on 06/28/2022)     Vitamin D, Cholecalciferol, 1000 UNITS TABS Take 5,000 Units by mouth daily.      No current facility-administered medications for this visit.     Musculoskeletal: Strength & Muscle Tone:  N/A Gait & Station:  N/A Patient leans: N/A  Psychiatric Specialty Exam: Review of Systems  Psychiatric/Behavioral:  Positive for decreased concentration, dysphoric mood and suicidal ideas. Negative for agitation, behavioral problems, confusion, hallucinations, self-injury and sleep disturbance. The patient is nervous/anxious. The patient is not  hyperactive.   All other systems reviewed and are negative.   There were no vitals taken for this visit.There is no height or weight on file to calculate BMI.  General Appearance: Fairly Groomed  Eye Contact:  Good  Speech:  Clear and Coherent  Volume:  Normal  Mood:  Depressed  Affect:  Appropriate, Congruent, and Tearful  Thought Process:  Coherent  Orientation:  Full (Time, Place, and Person)  Thought Content: Logical   Suicidal Thoughts:  Yes.  without intent/plan  Homicidal Thoughts:  No  Memory:  Immediate;   Good  Judgement:  Good  Insight:  Good  Psychomotor Activity:  Normal  Concentration:  Concentration: Good and Attention Span: Good  Recall:  Good  Fund of Knowledge: Good  Language: Good  Akathisia:  No  Handed:  Right  AIMS (if indicated): not done  Assets:  Communication Skills Desire for Improvement  ADL's:  Intact  Cognition: WNL  Sleep:  Fair   Screenings: GAD-7    Flowsheet Row Office Visit from 01/05/2022 in [place] Visit from 11/16/2021 in [place] Visit from 10/26/2021 in [place] Visit from 07/17/2021 in [place] Visit from 07/21/2020 in [place]  Total GAD-7 Score 21 21 12 21 21       PHQ2-9    [place] Visit from 01/05/2022 in [place] Visit from 11/16/2021 in [place] Visit from 10/26/2021 in [place] Visit from 08/31/2021 in [place] Office Visit from 07/17/2021 in [place]  PHQ-2 Total Score 2 4 2 2 2   PHQ-9 Total Score 11 22 11 10 13       [place] Office Visit from 08/31/2021 in [place] [facility] from 07/09/2021 in [place] Video Visit from 11/07/2020 in [place] No Risk Error: Q3, 4,  or 5 should  not be populated when Q2 is No        Assessment and Plan:  Tracie James is a 52 y.o. year old female with a history of depression, history of PVC, r/o seizure (not confirmed on 72 hour EEG), who presents for follow up appointment for below.   1. MDD (major depressive disorder), recurrent episode, moderate (HCC) 2. Anxiety Acute stressors include: work Other stressors include:  losing custody of her 54 year old daughter, conflict with her ex husband  History:   Exam is notable for tearfulness, and she continues to experience depressive symptoms with significant difficulty in concentration, which led to mistake at work.  She reports good relationship with her boyfriend except that there is a tension due to financial strain.  She reports slight improvement in her mood since starting Vraylar.  Will continue current dose at this time to see if it exerts its full benefit for depression.  Will uptitrate BuSpar to optimize treatment for anxiety.  Will continue fluoxetine and bupropion to target depression.  Will continue lorazepam as needed for anxiety.   3. Insomnia, unspecified type Improving after starting trazodone.  Will continue current dose to target insomnia.     Plan Continue fluoxetine 80 mg daily (monitor weight gain) Continue Bupropion 450 mg daily Increase Buspar 5 mg three times a day (was taking 5 mg twice a day, has enough meds to use) Continue Vraylar 1.5 mg daily  EKG QTc 468 msec 06/2022 Continue lorazepam 1 mg as needed for anxiety - she rarely takes this medication- 2 refills left Continue Trazodone 25-50 mg at night as needed for insomnia Next appointment: 2/16 at 11:30 for 30 mins, video - sleep study was not conclusive for sleep apnea in July 2023   She was discussed with HR to see if there is an option of her to take FMLA.  I would support that she will be out of work from today for 3 months due to her mood symptoms, which significantly interferes with her ability to  work at her capacity.    Past trials of medication: Cymbalta, Zoloft, Prozac, bupropion, Lithium (sick), lamotrigine (weight gain), Depakote (spending money), Latuda, Abilify (did not work), quetiapine (weight gain, somnolence). Geodon (PVC occurred)      I have reviewed suicide assessment in detail. No change in the following assessment.    The patient demonstrates the following risk factors for suicide: Chronic risk factors for suicide include: psychiatric disorder of depression and previous suicide attempts of overdosing medication. Acute risk factors for suicide include: family or marital conflict. Protective factors for this patient include: responsibility to others (children, family), coping skills and hope for the future. Patient is future oriented and agrees to come for next appointment. Considering these factors, the overall suicide risk at this point appears to be low. She is future oriented, and is amenable to treatment. Patient is appropriate for outpatient follow up.            Collaboration of Care: Collaboration of Care: Other reviewed notes in Epic  Patient/Guardian was advised Release of Information must be obtained prior to any record release in order to collaborate their care with an outside provider. Patient/Guardian was advised if they have not already done so to contact the registration department to sign all necessary forms in order for [place] to release information regarding their care.   Consent: Patient/Guardian gives verbal consent for treatment and assignment of benefits for services provided during this visit. Patient/Guardian  expressed understanding and agreed to proceed.    [name], MD 08/06/2022, 8:37 AM

## 2022-08-06 ENCOUNTER — Ambulatory Visit (INDEPENDENT_AMBULATORY_CARE_PROVIDER_SITE_OTHER): Payer: 59 | Admitting: Behavioral Health

## 2022-08-06 ENCOUNTER — Telehealth (INDEPENDENT_AMBULATORY_CARE_PROVIDER_SITE_OTHER): Payer: 59 | Admitting: Psychiatry

## 2022-08-06 ENCOUNTER — Encounter: Payer: Self-pay | Admitting: Psychiatry

## 2022-08-06 DIAGNOSIS — G4700 Insomnia, unspecified: Secondary | ICD-10-CM

## 2022-08-06 DIAGNOSIS — F3181 Bipolar II disorder: Secondary | ICD-10-CM

## 2022-08-06 DIAGNOSIS — F419 Anxiety disorder, unspecified: Secondary | ICD-10-CM

## 2022-08-06 DIAGNOSIS — F331 Major depressive disorder, recurrent, moderate: Secondary | ICD-10-CM | POA: Diagnosis not present

## 2022-08-06 NOTE — Progress Notes (Signed)
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 [employer]   Treatment Type: Individual Therapy  Reported Symptoms: Elevated anx/dep due to recent work issues that have caused high levels of stress  Mental Status Exam: Appearance:  Casual     Behavior: Appropriate and Sharing  Motor: Normal  Speech/Language:  Clear and Coherent  Affect: Appropriate  Mood: normal  Thought process: normal  Thought content:   WNL  Sensory/Perceptual disturbances:   WNL  Orientation: oriented to person, place, and time/date  Attention: Good  Concentration: Good  Memory: WNL  Fund of knowledge:  Good  Insight:   Good  Judgment:  Good  Impulse Control: Good   Risk Assessment: Danger to Self:  No Self-injurious Behavior: No Danger to Others: No Duty to Warn:no Physical Aggression / Violence:No  Access to Firearms a concern: No  Gang Involvement:No   Subjective: Pt is upset over an error @ work. She has placed her resignation in @ work due to an error she feels responsible for committing.   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 She is worried for her Hx of Sz in April 2023; her memory & recall.   Pt's gut has been upset-we discussed the elements of good brain health; proper hydration, healthy diet, exercise, & solid sleep.  Pt has a plan for speaking w/her Fredrich Birks & rescinding her Resignation.  Interventions:  SFBT  Diagnosis:Bipolar II disorder ([place])  Plan: Analuisa will use the suggestions provided today to rescind her Resignation. She will take her confidence & capabilities into her discussion w/her Fredrich Birks & proceed from there.  Target Date: 08/19/2022  Progress: 1  Frequency: Twice monthly  Modality: Boykin Reaper, LMFT

## 2022-08-06 NOTE — Progress Notes (Signed)
                Tracie Tejada L Mayo Faulk, LMFT 

## 2022-08-06 NOTE — Patient Instructions (Signed)
Continue fluoxetine 80 mg daily) Continue Bupropion 450 mg daily Increase Buspar 5 mg three times a day  Continue Vraylar 1.5 mg daily   Continue lorazepam 1 mg as needed for anxiety - she rarely takes this medication Continue Trazodone 25-50 mg at night as needed for insomnia Next appointment: 2/16 at 11:30

## 2022-08-17 ENCOUNTER — Other Ambulatory Visit: Payer: Self-pay | Admitting: Psychiatry

## 2022-08-17 MED ORDER — BUSPIRONE HCL 5 MG PO TABS: 5.0000 mg | ORAL_TABLET | Freq: Two times a day (BID) | ORAL | 0 refills | Status: AC

## 2022-08-17 MED ORDER — TRAZODONE HCL 50 MG PO TABS: 25.0000 mg | ORAL_TABLET | Freq: Every day | ORAL | 0 refills | Status: DC

## 2022-08-17 MED ORDER — CARIPRAZINE HCL 1.5 MG PO CAPS: 1.5000 mg | ORAL_CAPSULE | Freq: Every day | ORAL | 0 refills | Status: AC

## 2022-08-17 NOTE — Telephone Encounter (Signed)
Contacted the pharmacy.  She filled Buspar on 12/8 for 90 days, trazodone on 12/21 for 90 days. Will order  those  for 90 days this time due to issues with her insurance.

## 2022-08-19 ENCOUNTER — Ambulatory Visit: Payer: 59 | Admitting: Behavioral Health

## 2022-09-03 ENCOUNTER — Telehealth: Payer: 59 | Admitting: Psychiatry

## 2022-10-01 ENCOUNTER — Other Ambulatory Visit: Payer: Self-pay | Admitting: Psychiatry

## 2022-10-01 DIAGNOSIS — F330 Major depressive disorder, recurrent, mild: Secondary | ICD-10-CM

## 2022-10-15 ENCOUNTER — Other Ambulatory Visit: Payer: Self-pay | Admitting: Psychiatry

## 2022-10-15 DIAGNOSIS — F330 Major depressive disorder, recurrent, mild: Secondary | ICD-10-CM

## 2022-10-17 ENCOUNTER — Other Ambulatory Visit: Payer: Self-pay | Admitting: Psychiatry

## 2022-10-17 DIAGNOSIS — F330 Major depressive disorder, recurrent, mild: Secondary | ICD-10-CM

## 2022-12-07 ENCOUNTER — Other Ambulatory Visit: Payer: Self-pay | Admitting: Psychiatry

## 2022-12-07 DIAGNOSIS — F330 Major depressive disorder, recurrent, mild: Secondary | ICD-10-CM

## 2022-12-08 NOTE — Telephone Encounter (Signed)
Refill ordered per request. Please contact the patient for follow up appointment. I have not seen her since January.

## 2022-12-08 NOTE — Telephone Encounter (Signed)
Called patient to inform her of message she stated that she is unable to follow up with her PCP or Dr Vanetta Shawl due to not having any insurance she will follow up as soon as she is able to have insurance

## 2022-12-08 NOTE — Telephone Encounter (Signed)
I understand. Could either of you please contact her and advise her to schedule a follow-up appointment with her primary care physician if she has difficulty scheduling a follow-up with me? I will not be able to provide another refill moving forward without an evaluation.

## 2022-12-08 NOTE — Telephone Encounter (Signed)
Patient cancelled appt in February due to she lost her job. 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 She is unsure if she can pay out of pocket but will call once her insurance is in effect.

## 2023-01-10 ENCOUNTER — Encounter: Payer: Self-pay | Admitting: Nurse Practitioner

## 2023-01-10 MED ORDER — ONDANSETRON HCL 4 MG PO TABS: 4.0000 mg | ORAL_TABLET | Freq: Three times a day (TID) | ORAL | 4 refills | Status: DC | PRN

## 2023-02-09 ENCOUNTER — Other Ambulatory Visit: Payer: Self-pay | Admitting: Psychiatry

## 2023-02-10 ENCOUNTER — Other Ambulatory Visit: Payer: Self-pay | Admitting: Nurse Practitioner

## 2023-02-10 ENCOUNTER — Other Ambulatory Visit: Payer: Self-pay | Admitting: Psychiatry

## 2023-02-10 DIAGNOSIS — F419 Anxiety disorder, unspecified: Secondary | ICD-10-CM

## 2023-02-10 DIAGNOSIS — F330 Major depressive disorder, recurrent, mild: Secondary | ICD-10-CM

## 2023-02-10 NOTE — Telephone Encounter (Signed)
Needs appt . Expired medication

## 2023-02-10 NOTE — Telephone Encounter (Signed)
Requested medication (s) are due for refill today - yes  Requested medication (s) are on the active medication list -yes  Future visit scheduled -no  Last refill: 6/26/223 #180 3RF  Notes to clinic: Attempted to call patient to schedule appointment- left message to call office, fails lab protocol- over 1 year- 10/26/21  Requested Prescriptions  Pending Prescriptions Disp Refills   naproxen (NAPROSYN) 500 MG tablet [Pharmacy Med Name: NAPROXEN 500 MG TABLET] 180 tablet 3    Sig: TAKE 1 TABLET BY MOUTH TWICE A DAY WITH FOOD     Analgesics:  NSAIDS Failed - 02/10/2023  2:40 AM      Failed - Manual Review: Labs are only required if the patient has taken medication for more than 8 weeks.      Failed - Cr in normal range and within 360 days    Creatinine, Ser  Date Value Ref Range Status  10/26/2021 1.03 (H) 0.57 - 1.00 mg/dL Final         Failed - HGB in normal range and within 360 days    Hemoglobin  Date Value Ref Range Status  10/26/2021 11.7 11.1 - 15.9 g/dL Final         Failed - PLT in normal range and within 360 days    Platelets  Date Value Ref Range Status  10/26/2021 278 150 - 450 x10E3/uL Final         Failed - HCT in normal range and within 360 days    Hematocrit  Date Value Ref Range Status  10/26/2021 34.5 34.0 - 46.6 % Final         Failed - eGFR is 30 or above and within 360 days    GFR calc Af Amer  Date Value Ref Range Status  12/30/2020 71  Final   GFR, Estimated  Date Value Ref Range Status  10/24/2021 49 (L) >60 mL/min Final    Comment:    (NOTE) Calculated using the CKD-EPI Creatinine Equation (2021)    eGFR  Date Value Ref Range Status  10/26/2021 65 >59 mL/min/1.73 Final         Failed - Valid encounter within last 12 months    Recent Outpatient Visits           1 year ago Seizure disorder (HCC)   [place] [facility] [place], Corrie Dandy T, NP   1 year ago Seizure disorder [facility])   [place] [facility]  [place], Corrie Dandy T, NP   1 year ago Seizure disorder [facility])   [place] [facility] [place], Corrie Dandy T, NP   1 year ago Fibromyalgia   [place] [facility] [place], [place] T, NP   1 year ago Palpitations   [place] [facility] [place], [place], [place]              Passed - Patient is not pregnant         Requested Prescriptions  Pending Prescriptions Disp Refills   naproxen (NAPROSYN) 500 MG tablet [Pharmacy Med Name: NAPROXEN 500 MG TABLET] 180 tablet 3    Sig: TAKE 1 TABLET BY MOUTH TWICE A DAY WITH FOOD     Analgesics:  NSAIDS Failed - 02/10/2023  2:40 AM      Failed - Manual Review: Labs are only required if the patient has taken medication for more than 8 weeks.      Failed - Cr in normal range and within 360 days  Creatinine, Ser  Date Value Ref Range Status  10/26/2021 1.03 (H) 0.57 - 1.00 mg/dL Final         Failed - HGB in normal range and within 360 days    Hemoglobin  Date Value Ref Range Status  10/26/2021 11.7 11.1 - 15.9 g/dL Final         Failed - PLT in normal range and within 360 days    Platelets  Date Value Ref Range Status  10/26/2021 278 150 - 450 x10E3/uL Final         Failed - HCT in normal range and within 360 days    Hematocrit  Date Value Ref Range Status  10/26/2021 34.5 34.0 - 46.6 % Final         Failed - eGFR is 30 or above and within 360 days    GFR calc Af Amer  Date Value Ref Range Status  12/30/2020 71  Final   GFR, Estimated  Date Value Ref Range Status  10/24/2021 49 (L) >60 mL/min Final    Comment:    (NOTE) Calculated using the CKD-EPI Creatinine Equation (2021)    eGFR  Date Value Ref Range Status  10/26/2021 65 >59 mL/min/1.73 Final         Failed - Valid encounter within last 12 months    Recent Outpatient Visits           1 year ago Seizure disorder [facility])   [place] [facility] [place], Corrie Dandy T, NP   1 year ago Seizure disorder [facility])   [facility] [place], Corrie Dandy T, NP   1 year ago Seizure disorder [facility])   [place] [facility] [place], Dorie Rank, NP   1 year ago Fibromyalgia   [place] [facility] [place], Corrie Dandy T, NP   1 year ago Palpitations   [place] [facility] [place], [place], [place]              Passed - Patient is not pregnant

## 2023-02-17 ENCOUNTER — Other Ambulatory Visit: Payer: Self-pay | Admitting: Psychiatry

## 2023-02-18 ENCOUNTER — Other Ambulatory Visit: Payer: Self-pay | Admitting: Psychiatry

## 2023-02-18 DIAGNOSIS — F330 Major depressive disorder, recurrent, mild: Secondary | ICD-10-CM

## 2023-02-18 DIAGNOSIS — F419 Anxiety disorder, unspecified: Secondary | ICD-10-CM

## 2023-02-18 MED ORDER — FLUOXETINE HCL 40 MG PO CAPS
80.0000 mg | ORAL_CAPSULE | Freq: Every day | ORAL | 0 refills | Status: DC
Start: 2023-02-18 — End: 2023-05-06

## 2023-02-18 MED ORDER — TRAZODONE HCL 50 MG PO TABS: 25.0000 mg | ORAL_TABLET | Freq: Every day | ORAL | 0 refills | Status: DC

## 2023-02-18 NOTE — Telephone Encounter (Signed)
pt left message that her pharmacy has sent onver several refill request without no responce so she is calling to get a refill on the trazodone, bupropion, and prozac. (asked front desk to call and make patient an appt) pt last seen on 1-19

## 2023-03-29 ENCOUNTER — Ambulatory Visit: Payer: Commercial Managed Care - HMO | Admitting: Nurse Practitioner

## 2023-04-06 ENCOUNTER — Ambulatory Visit: Payer: Self-pay | Admitting: Nurse Practitioner

## 2023-04-20 ENCOUNTER — Ambulatory Visit: Payer: Self-pay | Admitting: Nurse Practitioner

## 2023-04-23 NOTE — Patient Instructions (Signed)
Please call to schedule your mammogram and/or bone density: [facility]  Address: [address], [place], [place] [address] Phone: [phone]  [facility] [address] [place],  [place]  [address] Phone: [phone]   Managing Depression, Adult Depression is a mental health condition that affects your thoughts, feelings, and actions. Being diagnosed with depression can bring you relief if you did not know why you have felt or behaved a certain way. It could also leave you feeling overwhelmed. Finding ways to manage your symptoms can help you feel more positive about your future. How to manage lifestyle changes Being depressed is difficult. Depression can increase the level of everyday stress. Stress can make depression symptoms worse. You may believe your symptoms cannot be managed or will never improve. However, there are many things you can try to help manage your symptoms. There is hope. Managing stress  Stress is your body's reaction to life changes and events, both good and bad. Stress can add to your feelings of depression. Learning to manage your stress can help lessen your feelings of depression. Try some of the following approaches to reducing your stress (stress reduction techniques): Listen to music that you enjoy and that inspires you. Try using a meditation app or take a meditation class. Develop a practice that helps you connect with your spiritual self. Walk in nature, pray, or go to a place of worship. Practice deep breathing. To do this, inhale slowly through your nose. Pause at the top of your inhale for a few seconds and then exhale slowly, letting yourself relax. Repeat this three or four times. Practice yoga to help relax and work your muscles. Choose a stress reduction technique that works for you. These techniques take time and practice to develop. Set aside 5-15 minutes a day to do them.  Therapists can offer training in these techniques. Do these things to help manage stress: Keep a journal. Know your limits. Set healthy boundaries for yourself and others, such as saying "no" when you think something is too much. Pay attention to how you react to certain situations. You may not be able to control everything, but you can change your reaction. Add humor to your life by watching funny movies or shows. Make time for activities that you enjoy and that relax you. Spend less time using electronics, especially at night before bed. The light from screens can make your brain think it is time to get up rather than go to bed.  Medicines Medicines, such as antidepressants, are often a part of treatment for depression. Talk with your pharmacist or health care provider about all the medicines, supplements, and herbal products that you take, their possible side effects, and what medicines and other products are safe to take together. Make sure to report any side effects you may have to your health care provider. Relationships Your health care provider may suggest family therapy, couples therapy, or individual therapy as part of your treatment. How to recognize changes Everyone responds differently to treatment for depression. As you recover from depression, you may start to: Have more interest in doing activities. Feel more hopeful. Have more energy. Eat a more regular amount of food. Have better mental focus. It is important to recognize if your depression is not getting better or is getting worse. The symptoms you had in the beginning may return, such as: Feeling tired. Eating too much or too little. Sleeping too much  or too little. Feeling restless, agitated, or hopeless. Trouble focusing or making decisions. Having unexplained aches and pains. Feeling irritable, angry, or aggressive. If you or your family members notice these symptoms coming back, let your health care provider know  right away. Follow these instructions at home: Activity Try to get some form of exercise each day, such as walking. Try yoga, mindfulness, or other stress reduction techniques. Participate in group activities if you are able. Lifestyle Get enough sleep. Cut down on or stop using caffeine, tobacco, alcohol, and any other harmful substances. Eat a healthy diet that includes plenty of vegetables, fruits, whole grains, low-fat dairy products, and lean protein. Limit foods that are high in solid fats, added sugar, or salt (sodium). General instructions Take over-the-counter and prescription medicines only as told by your health care provider. Keep all follow-up visits. It is important for your health care provider to check on your mood, behavior, and medicines. Your health care provider may need to make changes to your treatment. Where to find support Talking to others  Friends and family members can be sources of support and guidance. Talk to trusted friends or family members about your condition. Explain your symptoms and let them know that you are working with a health care provider to treat your depression. Tell friends and family how they can help. Finances Find mental health providers that fit with your financial situation. Talk with your health care provider if you are worried about access to food, housing, or medicine. Call your insurance company to learn about your co-pays and prescription plan. Where to find more information You can find support in your area from: Anxiety and Depression Association of America (ADAA): adaa.org Mental Health America: mentalhealthamerica.net The [employer] on Mental Illness: nami.org Contact a health care provider if: You stop taking your antidepressant medicines, and you have any of these symptoms: Nausea. Headache. Light-headedness. Chills and body aches. Not being able to sleep (insomnia). You or your friends and family think your depression  is getting worse. Get help right away if: You have thoughts of hurting yourself or others. Get help right away if you feel like you may hurt yourself or others, or have thoughts about taking your own life. Go to your nearest emergency room or: Call 911. Call the National Suicide Prevention Lifeline at (680)560-7970 or 988. This is open 24 hours a day. Text the Crisis Text Line at 3092098538. This information is not intended to replace advice given to you by your health care provider. Make sure you discuss any questions you have with your health care provider. Document Revised: 11/10/2021 Document Reviewed: 11/10/2021 Elsevier Patient Education  2024 [employer].

## 2023-04-25 ENCOUNTER — Ambulatory Visit: Payer: Managed Care, Other (non HMO) | Admitting: Nurse Practitioner

## 2023-04-25 ENCOUNTER — Encounter: Payer: Self-pay | Admitting: Nurse Practitioner

## 2023-04-25 VITALS — BP 123/85 | HR 66 | Temp 98.0°F | Ht 63.6 in | Wt 212.8 lb

## 2023-04-25 DIAGNOSIS — E66813 Obesity, class 3: Secondary | ICD-10-CM

## 2023-04-25 DIAGNOSIS — Z23 Encounter for immunization: Secondary | ICD-10-CM | POA: Diagnosis not present

## 2023-04-25 DIAGNOSIS — Z1211 Encounter for screening for malignant neoplasm of colon: Secondary | ICD-10-CM

## 2023-04-25 DIAGNOSIS — F331 Major depressive disorder, recurrent, moderate: Secondary | ICD-10-CM

## 2023-04-25 DIAGNOSIS — G40909 Epilepsy, unspecified, not intractable, without status epilepticus: Secondary | ICD-10-CM | POA: Diagnosis not present

## 2023-04-25 DIAGNOSIS — R002 Palpitations: Secondary | ICD-10-CM

## 2023-04-25 DIAGNOSIS — Z6841 Body Mass Index (BMI) 40.0 and over, adult: Secondary | ICD-10-CM

## 2023-04-25 DIAGNOSIS — Z1159 Encounter for screening for other viral diseases: Secondary | ICD-10-CM

## 2023-04-25 DIAGNOSIS — R7301 Impaired fasting glucose: Secondary | ICD-10-CM

## 2023-04-25 DIAGNOSIS — M797 Fibromyalgia: Secondary | ICD-10-CM

## 2023-04-25 DIAGNOSIS — F5104 Psychophysiologic insomnia: Secondary | ICD-10-CM

## 2023-04-25 DIAGNOSIS — Z1231 Encounter for screening mammogram for malignant neoplasm of breast: Secondary | ICD-10-CM

## 2023-04-25 DIAGNOSIS — F419 Anxiety disorder, unspecified: Secondary | ICD-10-CM

## 2023-04-25 DIAGNOSIS — D696 Thrombocytopenia, unspecified: Secondary | ICD-10-CM | POA: Diagnosis not present

## 2023-04-25 DIAGNOSIS — Z114 Encounter for screening for human immunodeficiency virus [HIV]: Secondary | ICD-10-CM

## 2023-04-25 DIAGNOSIS — B351 Tinea unguium: Secondary | ICD-10-CM | POA: Insufficient documentation

## 2023-04-25 MED ORDER — TERBINAFINE HCL 250 MG PO TABS: ORAL_TABLET | ORAL | 0 refills | Status: DC

## 2023-04-25 NOTE — Assessment & Plan Note (Signed)
Ongoing issue for years, took Terbinafine in past with benefit for short period.  Most recent liver function testing normal.  Will send in Terbinafine and educated on how to take + aware will need to monitor liver function closely while taking. If takes >6 weeks will check CBC and CMP.  At this time will take for two 4 week periods with break in between.

## 2023-04-25 NOTE — Progress Notes (Signed)
Appointment has been made.

## 2023-04-25 NOTE — Assessment & Plan Note (Signed)
BMI 36.99, has lost weight, praised for this.  Recommended eating smaller high protein, low fat meals more frequently and exercising 30 mins a day 5 times a week with a goal of 10-15lb weight loss in the next 3 months. Patient voiced their understanding and motivation to adhere to these recommendations.

## 2023-04-25 NOTE — Assessment & Plan Note (Signed)
Chronic, followed by psychiatry.  Will continue current medication regimen as prescribed by psychiatry and review notes when available.

## 2023-04-25 NOTE — Progress Notes (Signed)
BP 123/85   Pulse 66   Temp 98 F (36.7 C) (Oral)   Ht 5' 3.6" (1.615 m)   Wt 212 lb 12.8 oz (96.5 kg)   LMP  (LMP Unknown)   SpO2 96%   BMI 36.99 kg/m    Subjective:    Patient ID: Tracie James, female    DOB: 24-Aug-1970, 52 y.o.   MRN: [id number]  HPI: Tracie James is a 52 y.o. female  Chief Complaint  Patient presents with   Anxiety   Depression   Hypertension   Fibromyalgia   Seizure Disorder   IFG   Did pre employee screening at [facility], was found to not be immune and needs MMR.  Has fungal disease to toes and was given a prescription in past for this, 6 months. Terbinafine oral was given and this helped a little.  CHRONIC PAIN/FIBROMYALGIA She was out of work for 2 1/2 months and now has returned to work.  She has found this helped her overall mood and well being. Pain control status: stable Duration: chronic Location: various locations Quality: dull and aching Current Pain Level: none Previous Pain Level: moderate What Activities task can be accomplished with current medication? All ADL with Naproxen Non-narcotic analgesic meds: yes  DEPRESSION/ANXIETY & SEIZURES She is overall feeling better mood wise since taking 2 1/2 months off due to losing job.  Returns to psychiatry in November.    She took herself off seizures medications (Lamictal) and has been doing well, no recent seizures.  None since 11/18/2021. Mood status: stable Satisfied with current treatment?: yes Symptom severity: moderate  Duration of current treatment : chronic Side effects: no Medication compliance: good compliance Psychotherapy/counseling: yes in the past Depressed mood: sometimes, but not as bad Anxious mood: sometimes, but not as bad Anhedonia: no Significant weight loss or gain: no Insomnia: none Fatigue: occasional Feelings of worthlessness or guilt: no Impaired concentration/indecisiveness: no Suicidal ideations: no Hopelessness: no Crying spells: yes    04/25/2023     9:57 AM 01/05/2022    4:16 PM 11/16/2021    3:45 PM 10/26/2021    4:18 PM 08/31/2021    9:13 AM  Depression screen PHQ 2/9  Decreased Interest 2 1 2 1    Down, Depressed, Hopeless 1 1 2 1    PHQ - 2 Score 3 2 4 2    Altered sleeping 0 1 3 1    Tired, decreased energy 1 2 3 2    Change in appetite 3 3 3 3    Feeling bad or failure about yourself  2 1 2 1    Trouble concentrating 1 1 3  0   Moving slowly or fidgety/restless 1 0 2 1   Suicidal thoughts 0 1 2 1    PHQ-9 Score 11 11 22 11    Difficult doing work/chores Somewhat difficult Somewhat difficult        Information is confidential and restricted. Go to Review Flowsheets to unlock data.       04/25/2023    9:58 AM 01/05/2022    4:16 PM 11/16/2021    3:46 PM 10/26/2021    4:19 PM  GAD 7 : Generalized Anxiety Score  Nervous, Anxious, on Edge 3 3 3 2   Control/stop worrying 1 3 3 1   Worry too much - different things 3 3 3 1   Trouble relaxing 3 3 3 2   Restless 2 3 3 2   Easily annoyed or irritable 3 3 3 2   Afraid - awful might happen 3 3 3  2  Total GAD 7 Score 18 21 21 12   Anxiety Difficulty Somewhat difficult Somewhat difficult     Relevant past medical, surgical, family and social history reviewed and updated as indicated. Interim medical history since our last visit reviewed. Allergies and medications reviewed and updated.  Review of Systems  Constitutional:  Negative for activity change, appetite change, diaphoresis, fatigue and fever.  Respiratory:  Negative for cough, chest tightness and shortness of breath.   Cardiovascular:  Negative for chest pain, palpitations and leg swelling.  Gastrointestinal: Negative.   Endocrine: Negative.   Neurological: Negative.   Psychiatric/Behavioral:  Negative for decreased concentration, self-injury, sleep disturbance and suicidal ideas. The patient is nervous/anxious.    Per HPI unless specifically indicated above     Objective:    BP 123/85   Pulse 66   Temp 98 F (36.7 C) (Oral)    Ht 5' 3.6" (1.615 m)   Wt 212 lb 12.8 oz (96.5 kg)   LMP  (LMP Unknown)   SpO2 96%   BMI 36.99 kg/m   Wt Readings from Last 3 Encounters:  04/25/23 212 lb 12.8 oz (96.5 kg)  06/28/22 225 lb 6.4 oz (102.2 kg)  01/05/22 241 lb (109.3 kg)    Physical Exam Vitals and nursing note reviewed.  Constitutional:      General: She is awake. She is not in acute distress.    Appearance: She is well-developed and well-groomed. She is obese. She is not ill-appearing.  HENT:     Head: Normocephalic.     Right Ear: Hearing normal.     Left Ear: Hearing normal.     Nose: Nose normal.  Eyes:     General: Lids are normal.        Right eye: No discharge.        Left eye: No discharge.     Conjunctiva/sclera: Conjunctivae normal.     Pupils: Pupils are equal, round, and reactive to light.  Neck:     Thyroid: No thyromegaly.     Vascular: No carotid bruit.  Cardiovascular:     Rate and Rhythm: Normal rate and regular rhythm.     Pulses:          Dorsalis pedis pulses are 2+ on the right side and 2+ on the left side.       Posterior tibial pulses are 2+ on the right side and 2+ on the left side.     Heart sounds: Normal heart sounds. No murmur heard.    No gallop.  Pulmonary:     Effort: Pulmonary effort is normal. No accessory muscle usage or respiratory distress.     Breath sounds: Normal breath sounds.  Abdominal:     General: Bowel sounds are normal.     Palpations: Abdomen is soft.  Musculoskeletal:     Cervical back: Normal range of motion and neck supple.     Right lower leg: No edema.     Left lower leg: No edema.  Feet:     Right foot:     Protective Sensation: 10 sites tested.  10 sites sensed.     Skin integrity: Skin integrity normal.     Toenail Condition: Fungal disease present.    Left foot:     Protective Sensation: 10 sites tested.  10 sites sensed.     Skin integrity: Skin integrity normal.     Toenail Condition: Fungal disease present. Skin:    General: Skin is  warm and dry.  Neurological:  Mental Status: She is alert and oriented to person, place, and time.     Cranial Nerves: Cranial nerves 2-12 are intact.     Motor: Motor function is intact.     Coordination: Coordination is intact.     Gait: Gait is intact.     Deep Tendon Reflexes: Reflexes are normal and symmetric.     Reflex Scores:      Brachioradialis reflexes are 2+ on the right side and 2+ on the left side.      Patellar reflexes are 2+ on the right side and 2+ on the left side. Psychiatric:        Attention and Perception: Attention normal.        Mood and Affect: Mood normal.        Speech: Speech normal.        Behavior: Behavior normal. Behavior is cooperative.        Thought Content: Thought content normal.     Results for orders placed or performed in visit on 10/26/21  CBC with Differential/Platelet  Result Value Ref Range   WBC 7.6 3.4 - 10.8 x10E3/uL   RBC 4.16 3.77 - 5.28 x10E6/uL   Hemoglobin 11.7 11.1 - 15.9 g/dL   Hematocrit 16.1 09.6 - 46.6 %   MCV 83 79 - 97 fL   MCH 28.1 26.6 - 33.0 pg   MCHC 33.9 31.5 - 35.7 g/dL   RDW 04.5 40.9 - 81.1 %   Platelets 278 150 - 450 x10E3/uL   Neutrophils 62 Not Estab. %   Lymphs 25 Not Estab. %   Monocytes 6 Not Estab. %   Eos 5 Not Estab. %   Basos 1 Not Estab. %   Neutrophils Absolute 4.8 1.4 - 7.0 x10E3/uL   Lymphocytes Absolute 1.9 0.7 - 3.1 x10E3/uL   Monocytes Absolute 0.5 0.1 - 0.9 x10E3/uL   EOS (ABSOLUTE) 0.4 0.0 - 0.4 x10E3/uL   Basophils Absolute 0.1 0.0 - 0.2 x10E3/uL   Immature Granulocytes 1 Not Estab. %   Immature Grans (Abs) 0.0 0.0 - 0.1 x10E3/uL  Comprehensive metabolic panel  Result Value Ref Range   Glucose 73 70 - 99 mg/dL   BUN 16 6 - 24 mg/dL   Creatinine, Ser 9.14 (H) 0.57 - 1.00 mg/dL   eGFR 65 >78 GN/FAO/1.30   BUN/Creatinine Ratio 16 9 - 23   Sodium 139 134 - 144 mmol/L   Potassium 3.9 3.5 - 5.2 mmol/L   Chloride 105 96 - 106 mmol/L   CO2 21 20 - 29 mmol/L   Calcium 8.6 (L) 8.7 -  10.2 mg/dL   Total Protein 6.0 6.0 - 8.5 g/dL   Albumin 4.1 3.8 - 4.9 g/dL   Globulin, Total 1.9 1.5 - 4.5 g/dL   Albumin/Globulin Ratio 2.2 1.2 - 2.2   Bilirubin Total <0.2 0.0 - 1.2 mg/dL   Alkaline Phosphatase 94 44 - 121 IU/L   AST 31 0 - 40 IU/L   ALT 31 0 - 32 IU/L  TSH  Result Value Ref Range   TSH 1.720 0.450 - 4.500 uIU/mL      Assessment & Plan:   Problem List Items Addressed This Visit       Nervous and Auditory   Seizure disorder (HCC)    Initial in 2009 and return in 2023 -- no further seizures since May 2023.  She questions if related to stressors.  Has taken herself off Lamictal and is doing well.  Continue collaboration with neurology  as needed.  Labs today.  Restart medication as needed.      Relevant Orders   Comprehensive metabolic panel     Musculoskeletal and Integument   Onychomycosis    Ongoing issue for years, took Terbinafine in past with benefit for short period.  Most recent liver function testing normal.  Will send in Terbinafine and educated on how to take + aware will need to monitor liver function closely while taking. If takes >6 weeks will check CBC and CMP.  At this time will take for two 4 week periods with break in between.      Relevant Medications   terbinafine (LAMISIL) 250 MG tablet   Other Relevant Orders   CBC with Differential/Platelet   Comprehensive metabolic panel     Hematopoietic and Hemostatic   Thrombocytopenia (HCC)    Noted on past labs, will recheck today.      Relevant Orders   CBC with Differential/Platelet     Other   Anxiety    Chronic, followed by psychiatry.  Will continue current medication regimen as prescribed by psychiatry and review notes when available.      Fibromyalgia    Chronic, stable due to reduction in stressors.  Continue Naproxen as needed.  Took Gabapentin in past, return to this as needed.        Major depressive disorder, recurrent episode, moderate (HCC) - Primary    Chronic, followed  by psychiatry.  Will continue current medication regimen as prescribed by psychiatry and continue to review their notes.        Relevant Orders   Comprehensive metabolic panel   Obesity    BMI 36.99, has lost weight, praised for this.  Recommended eating smaller high protein, low fat meals more frequently and exercising 30 mins a day 5 times a week with a goal of 10-15lb weight loss in the next 3 months. Patient voiced their understanding and motivation to adhere to these recommendations.        Palpitations    Chronic, stable.  Continue Metoprolol and adjust dose as needed.  Labs today.      Relevant Orders   TSH   Lipid Panel w/o Chol/HDL Ratio   Other Visit Diagnoses     IFG (impaired fasting glucose)       Check A1c today.   Relevant Orders   HgB A1c   Need for hepatitis C screening test       Hep C screen on labs today per guidelines for one time screening, discussed with patient.   Relevant Orders   Hepatitis C antibody   Encounter for screening for HIV       Hep C screen on labs today per guidelines for one time screening, discussed with patient.   Relevant Orders   HIV Antibody (routine testing w rflx)   Encounter for screening mammogram for malignant neoplasm of breast       Mammogram ordered and instructed how to schedule.   Relevant Orders   MM 3D SCREENING MAMMOGRAM BILATERAL BREAST   Colon cancer screening       FOBT ordered   Relevant Orders   Fecal occult blood, imunochemical   Need for MMR vaccine       MMR provided today as patient reports at [facility] was not immune   Relevant Orders   MMR vaccine subcutaneous (Completed)   Flu vaccine need       Flu vaccine in office today, educated on this.  Follow up plan: Return in about 6 months (around 10/24/2023) for Depression, Anxiety, Seizures, Fibromyalgia.

## 2023-04-25 NOTE — Assessment & Plan Note (Signed)
Chronic, followed by psychiatry.  Will continue current medication regimen as prescribed by psychiatry and continue to review their notes.

## 2023-04-25 NOTE — Assessment & Plan Note (Signed)
Noted on past labs, will recheck today.

## 2023-04-25 NOTE — Assessment & Plan Note (Signed)
Chronic, stable.  Continue Metoprolol and adjust dose as needed.  Labs today.

## 2023-04-25 NOTE — Assessment & Plan Note (Signed)
Chronic, stable due to reduction in stressors.  Continue Naproxen as needed.  Took Gabapentin in past, return to this as needed.

## 2023-04-25 NOTE — Assessment & Plan Note (Signed)
Initial in 2009 and return in 2023 -- no further seizures since May 2023.  She questions if related to stressors.  Has taken herself off Lamictal and is doing well.  Continue collaboration with neurology as needed.  Labs today.  Restart medication as needed.

## 2023-04-26 ENCOUNTER — Other Ambulatory Visit: Payer: Self-pay | Admitting: Nurse Practitioner

## 2023-04-26 DIAGNOSIS — N1831 Chronic kidney disease, stage 3a: Secondary | ICD-10-CM

## 2023-04-26 LAB — COMPREHENSIVE METABOLIC PANEL
ALT: 37 [IU]/L — ABNORMAL HIGH (ref 0–32)
AST: 32 [IU]/L (ref 0–40)
Albumin: 4.3 g/dL (ref 3.8–4.9)
Alkaline Phosphatase: 114 [IU]/L (ref 44–121)
BUN/Creatinine Ratio: 13 (ref 9–23)
BUN: 18 mg/dL (ref 6–24)
Bilirubin Total: <0.2 mg/dL (ref 0.0–1.2)
CO2: 20 mmol/L (ref 20–29)
Calcium: 9.6 mg/dL (ref 8.7–10.2)
Chloride: 101 mmol/L (ref 96–106)
Creatinine, Ser: 1.39 mg/dL — ABNORMAL HIGH (ref 0.57–1.00)
Globulin, Total: 2.1 g/dL (ref 1.5–4.5)
Glucose: 57 mg/dL — ABNORMAL LOW (ref 70–99)
Potassium: 4.5 mmol/L (ref 3.5–5.2)
Sodium: 137 mmol/L (ref 134–144)
Total Protein: 6.4 g/dL (ref 6.0–8.5)
eGFR: 45 mL/min/{1.73_m2} — ABNORMAL LOW (ref >59)

## 2023-04-26 LAB — CBC WITH DIFFERENTIAL/PLATELET
Basophils Absolute: 0.1 10*3/uL (ref 0.0–0.2)
Basos: 1 %
EOS (ABSOLUTE): 0.4 10*3/uL (ref 0.0–0.4)
Eos: 5 %
Hematocrit: 37.1 % (ref 34.0–46.6)
Hemoglobin: 11.9 g/dL (ref 11.1–15.9)
Immature Grans (Abs): 0 10*3/uL (ref 0.0–0.1)
Immature Granulocytes: 0 %
Lymphocytes Absolute: 1.6 10*3/uL (ref 0.7–3.1)
Lymphs: 22 %
MCH: 27.2 pg (ref 26.6–33.0)
MCHC: 32.1 g/dL (ref 31.5–35.7)
MCV: 85 fL (ref 79–97)
Monocytes Absolute: 0.6 10*3/uL (ref 0.1–0.9)
Monocytes: 8 %
Neutrophils Absolute: 4.5 10*3/uL (ref 1.4–7.0)
Neutrophils: 64 %
Platelets: 262 10*3/uL (ref 150–450)
RBC: 4.37 x10E6/uL (ref 3.77–5.28)
RDW: 13.8 % (ref 11.7–15.4)
WBC: 7.1 10*3/uL (ref 3.4–10.8)

## 2023-04-26 LAB — TSH: TSH: 1.98 u[IU]/mL (ref 0.450–4.500)

## 2023-04-26 LAB — LIPID PANEL W/O CHOL/HDL RATIO
Cholesterol, Total: 195 mg/dL (ref 100–199)
HDL: 66 mg/dL (ref >39)
LDL Chol Calc (NIH): 110 mg/dL — ABNORMAL HIGH (ref 0–99)
Triglycerides: 109 mg/dL (ref 0–149)
VLDL Cholesterol Cal: 19 mg/dL (ref 5–40)

## 2023-04-26 LAB — HIV ANTIBODY (ROUTINE TESTING W REFLEX): HIV Screen 4th Generation wRfx: NONREACTIVE

## 2023-04-26 LAB — HEPATITIS C ANTIBODY: Hep C Virus Ab: NONREACTIVE

## 2023-04-26 LAB — HEMOGLOBIN A1C
Est. average glucose Bld gHb Est-mCnc: 114 mg/dL
Hgb A1c MFr Bld: 5.6 % (ref 4.8–5.6)

## 2023-04-26 NOTE — Progress Notes (Signed)
Contacted via MyChart  -- needs lab only visit in 2 weeks please The 10-year ASCVD risk score (Arnett DK, et al., 2019) is: 1.4%   Values used to calculate the score:     Age: 52 years     Sex: Female     Is Non-Hispanic African American: No     Diabetic: No     Tobacco smoker: No     Systolic Blood Pressure: 123 mmHg     Is BP treated: No     HDL Cholesterol: 66 mg/dL     Total Cholesterol: 195 mg/dL   Good morning Tracie James, your labs have returned: - Kidney function, creatinine and eGFR, has dropped some.  I would like you to increase water intake over next two weeks + avoid Ibuprofen products and lower salt intake.  I want to recheck this in 2 weeks outpatient.  Glucose was also a bit low, but this could be related to fasting or not eating recently. - Liver function shows mild elevation in ALT and normal AST.  We will continue to monitor this. - Blood count (CBC) is normal, as are thyroid and A1c (diabetes).  HIV and Hep C are negative. - Lipid panel shows mild elevation in LDL (bad cholesterol).  We do not need medication for this right now, continue weight loss, healthy diet, and regular activity.  Any questions? Keep being excellent!!  Thank you for allowing me to participate in your care.  I appreciate you. Kindest regards, Tracie James

## 2023-04-27 ENCOUNTER — Other Ambulatory Visit: Payer: Self-pay | Admitting: Psychiatry

## 2023-04-27 ENCOUNTER — Other Ambulatory Visit: Payer: Self-pay | Admitting: Cardiology

## 2023-04-27 DIAGNOSIS — F419 Anxiety disorder, unspecified: Secondary | ICD-10-CM

## 2023-04-27 DIAGNOSIS — F330 Major depressive disorder, recurrent, mild: Secondary | ICD-10-CM

## 2023-04-28 NOTE — Telephone Encounter (Signed)
I haven't seen her since January, and I can't provide any more refills. I see that she was able to see her primary care physician. Please advise her to ask her primary care provider to take over her medication, unless she can schedule a follow-up with me (she had some financial/insurance issues).

## 2023-05-06 ENCOUNTER — Encounter: Payer: Self-pay | Admitting: Psychiatry

## 2023-05-06 ENCOUNTER — Telehealth (INDEPENDENT_AMBULATORY_CARE_PROVIDER_SITE_OTHER): Payer: Managed Care, Other (non HMO) | Admitting: Psychiatry

## 2023-05-06 DIAGNOSIS — G4700 Insomnia, unspecified: Secondary | ICD-10-CM

## 2023-05-06 DIAGNOSIS — F330 Major depressive disorder, recurrent, mild: Secondary | ICD-10-CM | POA: Diagnosis not present

## 2023-05-06 DIAGNOSIS — F419 Anxiety disorder, unspecified: Secondary | ICD-10-CM | POA: Diagnosis not present

## 2023-05-06 MED ORDER — TRAZODONE HCL 50 MG PO TABS: 25.0000 mg | ORAL_TABLET | Freq: Every day | ORAL | 0 refills | Status: DC

## 2023-05-06 MED ORDER — BUPROPION HCL ER (XL) 150 MG PO TB24: 150.0000 mg | ORAL_TABLET | Freq: Every day | ORAL | 1 refills | Status: DC

## 2023-05-06 MED ORDER — BUSPIRONE HCL 5 MG PO TABS: 5.0000 mg | ORAL_TABLET | Freq: Three times a day (TID) | ORAL | 0 refills | Status: DC

## 2023-05-06 MED ORDER — BUPROPION HCL ER (XL) 300 MG PO TB24: 300.0000 mg | ORAL_TABLET | Freq: Every day | ORAL | 1 refills | Status: DC

## 2023-05-06 MED ORDER — FLUOXETINE HCL 40 MG PO CAPS: 80.0000 mg | ORAL_CAPSULE | Freq: Every day | ORAL | 1 refills | Status: DC

## 2023-05-06 NOTE — Patient Instructions (Signed)
Continue fluoxetine 80 mg daily  Continue Bupropion 450 mg daily Cntinue Buspar 5 mg three times a day  Continue Vraylar 1.5 mg daily   Continue lorazepam 1 mg as needed for anxiety Continue Trazodone 25-50 mg at night as needed for insomnia Next appointment 12/13 at 11:30

## 2023-05-06 NOTE — Progress Notes (Signed)
Virtual Visit via Video Note  I connected with Tracie James on 05/06/23 at 11:30 AM EDT by a video enabled telemedicine application and verified that I am speaking with the correct person using two identifiers.  Location: Patient: car Provider: office Persons participated in the visit- patient, provider    I discussed the limitations of evaluation and management by telemedicine and the availability of in person appointments. The patient expressed understanding and agreed to proceed.    I discussed the assessment and treatment plan with the patient. The patient was provided an opportunity to ask questions and all were answered. The patient agreed with the plan and demonstrated an understanding of the instructions.   The patient was advised to call back or seek an in-person evaluation if the symptoms worsen or if the condition fails to improve as anticipated.  I provided 20 minutes of non-face-to-face time during this encounter.   [name], MD    [facility] MD/PA/NP OP Progress Note  05/06/2023 12:09 PM Tracie James  MRN:  [id number]  Chief Complaint:  Chief Complaint  Patient presents with   Follow-up   HPI:  - she is not seen since Jan.   This is a follow-up appointment for depression, anxiety and insomnia.  She states that things are better.  She left the job at [employer], and is working at [employer].  There is no drama.  She goes to work, and go home. 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  Her boyfriend commented her that he did not recognize how much stress she was on as she has been happier.  They have been working on remodeling the kitchen.  When she was asked about her daughter, she states that this is a sad time of the year as there will be her daughter's birthday.  She becomes tearful, stating that she has not heard back from her.  She tries to pretend as if she does not exist.  It is good that people at work does not know about the details about her.  People would not question  about her.  She denies staying depressed.  She sleeps better.  She denies significant anxiety.  She still has 20 tabs of lorazepam left.  She denies SI.  She denies alcohol use or drug use.  She has been able to maintain on her medication as it was in January. She expresses appreciation of the prescription, and [facility] in [facility] reasonable price.  She feels comfortable to stay on the current medication regimen at this time.   Employment: Designer, industrial/product Support: boyfriend of ten years Household: boyfriend of 11 years in 2022 Marital status: divorced Number of children: 2 daughters, oldest is 102 year old, will move to [place]  Wt Readings from Last 3 Encounters:  04/25/23 212 lb 12.8 oz (96.5 kg)  06/28/22 225 lb 6.4 oz (102.2 kg)  01/05/22 241 lb (109.3 kg)      Visit Diagnosis:    ICD-10-CM   1. MDD (major depressive disorder), recurrent episode, mild (HCC)  F33.0 buPROPion (WELLBUTRIN XL) 150 MG 24 hr tablet    buPROPion (WELLBUTRIN XL) 300 MG 24 hr tablet    FLUoxetine (PROZAC) 40 MG capsule    2. Anxiety  F41.9 FLUoxetine (PROZAC) 40 MG capsule    3. Insomnia, unspecified type  G47.00       Past Psychiatric History: Please see initial evaluation for full details. I have reviewed the history. No updates at this time.  Past Medical History:  Past Medical History:  Diagnosis Date   Anxiety    Asthma    Bipolar 2 disorder (HCC)    Depression    GERD (gastroesophageal reflux disease)    Palpitations    PVC (premature ventricular contraction)     Past Surgical History:  Procedure Laterality Date   APPENDECTOMY     TOTAL ABDOMINAL HYSTERECTOMY      Family Psychiatric History: Please see initial evaluation for full details. I have reviewed the history. No updates at this time.     Family History:  Family History  Problem Relation Age of Onset   Depression Mother    Multiple sclerosis Mother    Schizophrenia Maternal Grandmother    Heart disease Maternal  Grandmother        aortic valve replacement    Social History:  Social History   Socioeconomic History   Marital status: Divorced    Spouse name: Not on file   Number of children: Not on file   Years of education: Not on file   Highest education level: Not on file  Occupational History   Not on file  Tobacco Use   Smoking status: Former    Current packs/day: 0.00    Types: Cigarettes    Quit date: 03/31/2014    Years since quitting: 9.1   Smokeless tobacco: Never  Vaping Use   Vaping status: Every Day  Substance and Sexual Activity   Alcohol use: No   Drug use: No   Sexual activity: Yes    Partners: Male    Birth control/protection: Surgical  Other Topics Concern   Not on file  Social History Narrative   Not on file   Social Determinants of Health   Financial Resource Strain: Not on file  Food Insecurity: Not on file  Transportation Needs: Not on file  Physical Activity: Not on file  Stress: Not on file  Social Connections: Not on file    Allergies:  Allergies  Allergen Reactions   Erythromycin Hives   Tetracyclines & Related Hives    Metabolic Disorder Labs: Lab Results  Component Value Date   HGBA1C 5.6 04/25/2023   No results found for: "PROLACTIN" Lab Results  Component Value Date   CHOL 195 04/25/2023   TRIG 109 04/25/2023   HDL 66 04/25/2023   LDLCALC 110 (H) 04/25/2023   Lab Results  Component Value Date   TSH 1.980 04/25/2023   TSH 1.720 10/26/2021    Therapeutic Level Labs: No results found for: "LITHIUM" No results found for: "VALPROATE" No results found for: "CBMZ"  Current Medications: Current Outpatient Medications  Medication Sig Dispense Refill   b complex vitamins capsule Take 1 capsule by mouth daily.     buPROPion (WELLBUTRIN XL) 150 MG 24 hr tablet Take 1 tablet (150 mg total) by mouth daily. Take total of 450 mg daily. Along with 300 mg tab 90 tablet 1   buPROPion (WELLBUTRIN XL) 300 MG 24 hr tablet Take 1 tablet (300  mg total) by mouth daily. TAKE 1 TABLET DAILY AND 150 MG DAILY (TOTAL OF 450 MG) 90 tablet 1   [START ON 05/22/2023] busPIRone (BUSPAR) 5 MG tablet Take 1 tablet (5 mg total) by mouth 3 (three) times daily. 270 tablet 0   Coenzyme Q10 (COQ-10 PO) Take 1 capsule by mouth daily.     cyclobenzaprine (FLEXERIL) 10 MG tablet TAKE 1 TABLET BY MOUTH AT BEDTIME AS NEEDED FOR MUSCLE SPASMS. 30 tablet 1   [  START ON 05/18/2023] FLUoxetine (PROZAC) 40 MG capsule Take 2 capsules (80 mg total) by mouth daily. 180 capsule 1   lansoprazole (PREVACID) 15 MG capsule Take 15 mg by mouth daily at 12 noon.     LORazepam (ATIVAN) 1 MG tablet Take 1 tablet (1 mg total) by mouth daily as needed for anxiety. 30 tablet 2   metoprolol succinate (TOPROL-XL) 50 MG 24 hr tablet Take 1 tablet (50 mg total) by mouth daily. Take with or immediately following a meal.Take with or immediately following a meal--Please call office to schedule appointment prior to next refill. 90 tablet 0   naproxen (NAPROSYN) 500 MG tablet TAKE 1 TABLET BY MOUTH TWICE A DAY WITH FOOD 180 tablet 3   ondansetron (ZOFRAN) 4 MG tablet Take 1 tablet (4 mg total) by mouth every 8 (eight) hours as needed for nausea or vomiting. 30 tablet 4   terbinafine (LAMISIL) 250 MG tablet Take 250 mg orally once daily for 4 weeks, then off for 4 weeks, then resume with 250 mg orally once daily for 4 weeks. 70 tablet 0   [START ON 05/18/2023] traZODone (DESYREL) 50 MG tablet Take 0.5-1 tablets (25-50 mg total) by mouth at bedtime. 90 tablet 0   Vitamin D, Cholecalciferol, 1000 UNITS TABS Take 5,000 Units by mouth daily.      No current facility-administered medications for this visit.     Musculoskeletal: Strength & Muscle Tone:  N/A Gait & Station:  N/A Patient leans: N/A  Psychiatric Specialty Exam: Review of Systems  Psychiatric/Behavioral:  Positive for dysphoric mood. Negative for agitation, behavioral problems, confusion, decreased concentration,  hallucinations, self-injury, sleep disturbance and suicidal ideas. The patient is nervous/anxious. The patient is not hyperactive.   All other systems reviewed and are negative.   There were no vitals taken for this visit.There is no height or weight on file to calculate BMI.  General Appearance: Well Groomed  Eye Contact:  Good  Speech:  Clear and Coherent  Volume:  Normal  Mood:   better  Affect:  Appropriate, Congruent, and calm, smile  Thought Process:  Coherent  Orientation:  Full (Time, Place, and Person)  Thought Content: Logical   Suicidal Thoughts:  No  Homicidal Thoughts:  No  Memory:  Immediate;   Good  Judgement:  Good  Insight:  Good  Psychomotor Activity:  Normal  Concentration:  Concentration: Good and Attention Span: Good  Recall:  Good  Fund of Knowledge: Good  Language: Good  Akathisia:  No  Handed:  Right  AIMS (if indicated): not done  Assets:  Communication Skills Desire for Improvement  ADL's:  Intact  Cognition: WNL  Sleep:  Good   Screenings: GAD-7    Flowsheet Row Office Visit from 04/25/2023 in [facility] Office Visit from 01/05/2022 in [facility] Office Visit from 11/16/2021 in [facility] Office Visit from 10/26/2021 in [facility] Office Visit from 07/17/2021 in [facility]  Total GAD-7 Score 18 21 21 12 21       PHQ2-9    Flowsheet Row Office Visit from 04/25/2023 in [facility] Office Visit from 01/05/2022 in [facility] Office Visit from 11/16/2021 in [facility] Office Visit from 10/26/2021 in [facility] Office Visit from 08/31/2021 in [facility]  PHQ-2 Total Score 3 2 4 2 2   PHQ-9  Total Score 11 11 22 11 10       Flowsheet Row Office Visit from 08/31/2021 in [facility] ED from 07/09/2021 in [facility] Emergency Department at [facility] Video Visit from 11/07/2020 in [facility]  C-SSRS RISK CATEGORY Low Risk No Risk Error: Q3, 4, or 5 should not be populated when Q2 is No        Assessment and Plan:  Tracie James is a 52 y.o. year old female with a history of depression, history of PVC, r/o seizure (not confirmed on 72 hour EEG), who presents for follow up appointment for below.   1. MDD (major depressive disorder), recurrent episode, mild (HCC) 2. Anxiety Acute stressors include:  Other stressors include:  losing custody of her 17 year old daughter, conflict with her ex husband  History:   There has been significant improvement in depressive symptoms and anxiety in the context of changing her job.  Will continue current medication regimen at this time given she reports more sadness on this season in relation to her daughter's birthday.  Will continue fluoxetine, bupropion, Vraylar to target depression.  Will continue BuSpar for anxiety.  Will consider tapering off either of the medication in the future if her mood continues to be stable.  Noted that she really needs to take lorazepam.   3. Insomnia, unspecified type Significantly improving.  Will continue current dose of trazodone as needed for insomnia.     Plan Continue fluoxetine 80 mg daily (monitor weight gain) Continue Bupropion 450 mg daily Continue Buspar 5 mg three times a day  Continue Vraylar 1.5 mg daily  EKG QTc 468 msec 06/2022 Continue lorazepam 1 mg as needed for anxiety - she rarely takes this medication Continue Trazodone 25-50 mg at night as needed for insomnia Next appointment 12/13 at 11:30 for 30 mins, video - sleep study was not conclusive for sleep apnea in July 2023    She was discussed with HR to see if there is an option of her to take FMLA.  I would support that she will be out of work  from today for 3 months due to her mood symptoms, which significantly interferes with her ability to work at her capacity.    Past trials of medication: Cymbalta, Zoloft, Prozac, bupropion, Lithium (sick), lamotrigine (weight gain), Depakote (spending money), Latuda, Abilify (did not work), quetiapine (weight gain, somnolence). Geodon (PVC occurred)      I have reviewed suicide assessment in detail. No change in the following assessment.    The patient demonstrates the following risk factors for suicide: Chronic risk factors for suicide include: psychiatric disorder of depression and previous suicide attempts of overdosing medication. Acute risk factors for suicide include: family or marital conflict. Protective factors for this patient include: responsibility to others (children, family), coping skills and hope for the future. Patient is future oriented and agrees to come for next appointment. Considering these factors, the overall suicide risk at this point appears to be low. She is future oriented, and is amenable to treatment. Patient is appropriate for outpatient follow up.      Collaboration of Care: Collaboration of Care: Other reviewed notes in Epic  Patient/Guardian was advised Release of Information must be obtained prior to any record release in order to collaborate their care with an outside provider. Patient/Guardian was advised if they have not already done so to contact the registration department to sign all necessary forms in order  for [place] to release information regarding their care.   Consent: Patient/Guardian gives verbal consent for treatment and assignment of benefits for services provided during this visit. Patient/Guardian expressed understanding and agreed to proceed.    [name], MD 05/06/2023, 12:09 PM

## 2023-05-20 ENCOUNTER — Ambulatory Visit: Payer: Self-pay | Admitting: Nurse Practitioner

## 2023-05-24 ENCOUNTER — Other Ambulatory Visit: Payer: Self-pay | Admitting: Nurse Practitioner

## 2023-05-24 NOTE — Telephone Encounter (Unsigned)
Copied from CRM [id number]. Topic: General - Other >> May 24, 2023  5:57 PM Everette C wrote: Reason for CRM: Medication Refill - Medication: metoprolol succinate (TOPROL-XL) 50 MG 24 hr tablet [[id number]]  Has the patient contacted their pharmacy? Yes.   (Agent: If no, request that the patient contact the pharmacy for the refill. If patient does not wish to contact the pharmacy document the reason why and proceed with request.) (Agent: If yes, when and what did the pharmacy advise?)  Preferred Pharmacy (with phone number or street name): [id number] - [place], [place] - [address] [place] [address] Phone: [phone] Fax: [phone] Hours: Not open 24 hours   Has the patient been seen for an appointment in the last year OR does the patient have an upcoming appointment? Yes.    Agent: Please be advised that RX refills may take up to 3 business days. We ask that you follow-up with your pharmacy.

## 2023-05-25 ENCOUNTER — Telehealth: Payer: Self-pay

## 2023-05-25 NOTE — Telephone Encounter (Signed)
Noted  

## 2023-05-25 NOTE — Telephone Encounter (Signed)
pt called states that her insurance has changed and that she needs to use the [facility] [address]. she needs her medication sent to this pharmacy. she needs trazodone, wellbutrin 150mg  and the 300mg , buspirone, fluoxetine and lorazepam. pt was last on 10-18 next appt 12-13

## 2023-05-25 NOTE — Telephone Encounter (Signed)
had to leave a message with directions per your order ( i tired twice but no answer) asked if they would call our office back once done.. I will try again tomorrow to see if i can get someone.

## 2023-05-25 NOTE — Telephone Encounter (Signed)
[facility]l send all her prescription to for 90 days with 1 refill and cancel all the psychotropics send out there.  Once that is complete please let me know.  I can send new prescription to new pharmacy.

## 2023-05-26 ENCOUNTER — Telehealth: Payer: Self-pay

## 2023-05-26 LAB — FECAL OCCULT BLOOD, IMMUNOCHEMICAL: Fecal Occult Bld: NEGATIVE

## 2023-05-26 NOTE — Progress Notes (Signed)
Contacted via MyChart   Good morning Tracie James, your stool is negative for blood.  Any questions? Keep being amazing!!  Thank you for allowing me to participate in your care.  I appreciate you. Kindest regards, Rickesha Veracruz

## 2023-05-26 NOTE — Telephone Encounter (Signed)
Sorry I cannot send duplicate refills, please make sure this is cancelled and I can check anyone that does not need cancellation , will be send today . But previous script needs to be cancelled prior to sending in more. I am attaching Vikki Ports also

## 2023-05-26 NOTE — Telephone Encounter (Signed)
[facility] let [facility] know that no [facility] had been transfered to [facility] and that i needed to find out if and when pt had last filled the wellbutrins, buspar, fluoxetine, and the trazodone. I also asked them to call our office back with that information and I also requested that they fax that info as well.    I put pt on wait list so hopefully she can see dr. Vanetta Shawl sooner because of this issues of not getting her medications and doing without.

## 2023-05-26 NOTE — Telephone Encounter (Signed)
Okay; thanks.

## 2023-05-26 NOTE — Telephone Encounter (Signed)
i have called again. i have left 3 messages on the doctor's line and have not received a call back.

## 2023-05-26 NOTE — Telephone Encounter (Signed)
Ok, thanks.

## 2023-05-26 NOTE — Telephone Encounter (Signed)
Val contacted the pharmacy and has sent another phone note -FYI.

## 2023-05-26 NOTE — Telephone Encounter (Signed)
called [facility] to make sure that they received rxs and according the the pharm he stated that [facility] not [facility] any [facility] to them that they have sent over several request and they have not received anything. he states that according to pt she is very upset because she has not had her medication in 2 weeks.

## 2023-05-26 NOTE — Telephone Encounter (Signed)
[facility] spoke to [place] she stated that the patient picked up buspirone on 05/17/23 everything else has been transferred out to [facility] on 05/24/23

## 2023-05-26 NOTE — Telephone Encounter (Signed)
pt states that she had been out of medications for about 2 weeks. she said that [facility] has sent multi [facility] to [facility]xs but they do not respond. she said [facility] and they still have not done it. she said because of all of this she has been without her medications for about 2 weeks now.   Can you please send rxs she is not every [facility].

## 2023-05-26 NOTE — Telephone Encounter (Signed)
Does she want her scripts sent to [facility]? If so, which one?

## 2023-05-26 NOTE — Telephone Encounter (Signed)
finally got a hold of someone and he stated that the rx were all transfered to [facility] and that they have canceled all the rx that i had left on the voicemail messages.

## 2023-05-30 ENCOUNTER — Other Ambulatory Visit: Payer: Self-pay | Admitting: Psychiatry

## 2023-05-30 DIAGNOSIS — F419 Anxiety disorder, unspecified: Secondary | ICD-10-CM

## 2023-05-30 DIAGNOSIS — F330 Major depressive disorder, recurrent, mild: Secondary | ICD-10-CM

## 2023-05-30 MED ORDER — BUPROPION HCL ER (XL) 300 MG PO TB24: 300.0000 mg | ORAL_TABLET | Freq: Every day | ORAL | 1 refills | Status: DC

## 2023-05-30 MED ORDER — FLUOXETINE HCL 40 MG PO CAPS: 80.0000 mg | ORAL_CAPSULE | Freq: Every day | ORAL | 1 refills | Status: DC

## 2023-05-30 MED ORDER — TRAZODONE HCL 50 MG PO TABS: 25.0000 mg | ORAL_TABLET | Freq: Every day | ORAL | 0 refills | Status: DC

## 2023-05-30 MED ORDER — BUPROPION HCL ER (XL) 150 MG PO TB24: 150.0000 mg | ORAL_TABLET | Freq: Every day | ORAL | 1 refills | Status: DC

## 2023-05-30 NOTE — Telephone Encounter (Signed)
wellbutrins, buspar, fluoxetine, and the trazodone

## 2023-05-30 NOTE — Telephone Encounter (Signed)
To verify, which medication she needs at this time?

## 2023-05-30 NOTE — Telephone Encounter (Signed)
Bupropion, fluoxetine, and trazodone were ordered. There was a note, stating that she filled a prescription for Buspar in late October, so I did not send it this time. [facility] to [facility] any orders I may have placed. She does not need to be on the waitlist for an earlier appointment unless she specifically requests one.

## 2023-05-30 NOTE — Telephone Encounter (Signed)
left message that rx were sent to the pharmacy   (the [facility]xs [facility] had already been canceled)

## 2023-05-30 NOTE — Telephone Encounter (Signed)
pt left another message states that she still has not gotten her rx sent to [facility]. she states that if someone does not send her medication to the [facility] she will have to do without the medications

## 2023-05-31 ENCOUNTER — Encounter: Payer: Self-pay | Admitting: Nurse Practitioner

## 2023-06-01 MED ORDER — METOPROLOL SUCCINATE ER 50 MG PO TB24: 50.0000 mg | ORAL_TABLET | Freq: Every day | ORAL | 1 refills | Status: DC

## 2023-06-06 ENCOUNTER — Other Ambulatory Visit: Payer: Managed Care, Other (non HMO)

## 2023-06-07 ENCOUNTER — Other Ambulatory Visit: Payer: Managed Care, Other (non HMO)

## 2023-06-07 ENCOUNTER — Other Ambulatory Visit: Payer: Self-pay | Admitting: Psychiatry

## 2023-06-07 DIAGNOSIS — B351 Tinea unguium: Secondary | ICD-10-CM

## 2023-06-07 DIAGNOSIS — N1831 Chronic kidney disease, stage 3a: Secondary | ICD-10-CM

## 2023-06-08 LAB — COMPREHENSIVE METABOLIC PANEL
ALT: 59 [IU]/L — ABNORMAL HIGH (ref 0–32)
AST: 43 [IU]/L — ABNORMAL HIGH (ref 0–40)
Albumin: 3.8 g/dL (ref 3.8–4.9)
Alkaline Phosphatase: 131 [IU]/L — ABNORMAL HIGH (ref 44–121)
BUN/Creatinine Ratio: 10 (ref 9–23)
BUN: 12 mg/dL (ref 6–24)
Bilirubin Total: 0.2 mg/dL (ref 0.0–1.2)
CO2: 23 mmol/L (ref 20–29)
Calcium: 8.6 mg/dL — ABNORMAL LOW (ref 8.7–10.2)
Chloride: 105 mmol/L (ref 96–106)
Creatinine, Ser: 1.15 mg/dL — ABNORMAL HIGH (ref 0.57–1.00)
Globulin, Total: 2.2 g/dL (ref 1.5–4.5)
Glucose: 88 mg/dL (ref 70–99)
Potassium: 3.9 mmol/L (ref 3.5–5.2)
Sodium: 142 mmol/L (ref 134–144)
Total Protein: 6 g/dL (ref 6.0–8.5)
eGFR: 57 mL/min/{1.73_m2} — ABNORMAL LOW (ref >59)

## 2023-06-08 LAB — CBC WITH DIFFERENTIAL/PLATELET
Basophils Absolute: 0 10*3/uL (ref 0.0–0.2)
Basos: 1 %
EOS (ABSOLUTE): 0.2 10*3/uL (ref 0.0–0.4)
Eos: 5 %
Hematocrit: 31.7 % — ABNORMAL LOW (ref 34.0–46.6)
Hemoglobin: 10.2 g/dL — ABNORMAL LOW (ref 11.1–15.9)
Immature Grans (Abs): 0 10*3/uL (ref 0.0–0.1)
Immature Granulocytes: 0 %
Lymphocytes Absolute: 1.9 10*3/uL (ref 0.7–3.1)
Lymphs: 37 %
MCH: 27 pg (ref 26.6–33.0)
MCHC: 32.2 g/dL (ref 31.5–35.7)
MCV: 84 fL (ref 79–97)
Monocytes Absolute: 0.4 10*3/uL (ref 0.1–0.9)
Monocytes: 7 %
Neutrophils Absolute: 2.6 10*3/uL (ref 1.4–7.0)
Neutrophils: 50 %
Platelets: 184 10*3/uL (ref 150–450)
RBC: 3.78 x10E6/uL (ref 3.77–5.28)
RDW: 14.1 % (ref 11.7–15.4)
WBC: 5.1 10*3/uL (ref 3.4–10.8)

## 2023-06-08 NOTE — Progress Notes (Signed)
Contacted via MyChart - please schedule a visit for 2 weeks with me to do assessment and recheck labs.  Thank you.   Good evening Tracie James, your labs have returned: - Kidney function continues to show some mild reduction but is trending up to normal levels. Any new medications you are taking? Any Ibuprofen use?   - Liver function tests this time have trended up a little, AST and ALT.  Your alkaline phosphatase is also a little elevated.  Do you still have a gall bladder?  Been sick recently? Alcohol or drug use? - Your CBC is showing some mild anemia which is new, lower hemoglobin and hematocrit. Any recent heavy bleeding? I am going to have staff call you and schedule a visit for 2 weeks out so I can further examine you and repeat labs.  Any questions? Keep being stellar!!  Thank you for allowing me to participate in your care.  I appreciate you. Kindest regards, Isla Sabree

## 2023-06-09 ENCOUNTER — Encounter: Payer: Self-pay | Admitting: Nurse Practitioner

## 2023-06-09 NOTE — Progress Notes (Signed)
Called and scheduled appointment on Dec 5,2024 @ 8:40 am.

## 2023-06-18 NOTE — Patient Instructions (Signed)

## 2023-06-23 ENCOUNTER — Ambulatory Visit: Payer: Managed Care, Other (non HMO) | Admitting: Nurse Practitioner

## 2023-06-23 ENCOUNTER — Encounter: Payer: Self-pay | Admitting: Nurse Practitioner

## 2023-06-23 VITALS — BP 121/72 | HR 66 | Temp 97.7°F | Ht 63.5 in | Wt 214.0 lb

## 2023-06-23 DIAGNOSIS — R7989 Other specified abnormal findings of blood chemistry: Secondary | ICD-10-CM | POA: Diagnosis not present

## 2023-06-23 DIAGNOSIS — D696 Thrombocytopenia, unspecified: Secondary | ICD-10-CM | POA: Diagnosis not present

## 2023-06-23 DIAGNOSIS — D649 Anemia, unspecified: Secondary | ICD-10-CM | POA: Diagnosis not present

## 2023-06-23 NOTE — Assessment & Plan Note (Signed)
Stable, has been normal on past few labs.  Monitor closely.

## 2023-06-23 NOTE — Assessment & Plan Note (Signed)
New, noted on recent labs.  No red flag symptoms.  History of IDA.  Will recheck labs today and check iron.  FOBT was negative, she refuses colonoscopy.  If iron low, will start supplement and continue to recommend colonoscopy for screening.  Determine next steps after labs return.

## 2023-06-23 NOTE — Assessment & Plan Note (Signed)
New, noted on recent labs.  Her Terbinafine is on hold.  History of gallstones and still has gall bladder.  Check labs today: CMP, CBC, GGT.  Obtain RUQ ultrasound.  Overall no red flag symptoms.  Determine next steps after all testing returns.

## 2023-06-23 NOTE — Progress Notes (Signed)
BP 121/72   Pulse 66   Temp 97.7 F (36.5 C) (Oral)   Ht 5' 3.5" (1.613 m)   Wt 214 lb (97.1 kg)   LMP  (LMP Unknown)   SpO2 98%   BMI 37.31 kg/m    Subjective:    Patient ID: Tracie James, female    DOB: 03/23/1971, 52 y.o.   MRN: [id number]  HPI: Tracie James is a 52 y.o. female  Chief Complaint  Patient presents with   Labwork Follow up    2 week f/up from most recent lab work    ANEMIA Presents for follow-up on recent labs due to new finding of low H/H.  She denies any bleeding.  Has never had a colonoscopy.  FOBT was negative.  She does still have gall bladder, [employer] would not take this out in past -- was having issues at time.  This was 15 years ago.  Denies any recent symptoms similar to 15 years.  Denies alcohol or drug use.  Does vape.  No frequent Tylenol. Has history of iron deficiency anemia after having first child.  History of hysterectomy.  Was taking Terbinafine, but is on hold at present due to recent mild elevation in LFTs and Alk Phos. Anemia status: stable Etiology of anemia: unknown Duration of anemia treatment: none Severity of anemia: mild Fatigue: at baseline Decreased exercise tolerance: no  Dyspnea on exertion: no Palpitations:  at baseline has palpitations, takes Metoprolol   Bleeding: no Pica: no   Relevant past medical, surgical, family and social history reviewed and updated as indicated. Interim medical history since our last visit reviewed. Allergies and medications reviewed and updated.  Review of Systems  Constitutional:  Positive for fatigue (at baseline). Negative for activity change, appetite change, diaphoresis and fever.  Respiratory:  Negative for cough, chest tightness, shortness of breath and wheezing.   Cardiovascular:  Positive for palpitations (at baseline occasional). Negative for chest pain and leg swelling.  Gastrointestinal:  Negative for abdominal distention, abdominal pain, anal bleeding, blood in stool,  diarrhea, nausea, rectal pain and vomiting.  Endocrine: Negative for cold intolerance and heat intolerance.  Neurological: Negative.   Psychiatric/Behavioral: Negative.     Per HPI unless specifically indicated above     Objective:    BP 121/72   Pulse 66   Temp 97.7 F (36.5 C) (Oral)   Ht 5' 3.5" (1.613 m)   Wt 214 lb (97.1 kg)   LMP  (LMP Unknown)   SpO2 98%   BMI 37.31 kg/m   Wt Readings from Last 3 Encounters:  06/23/23 214 lb (97.1 kg)  04/25/23 212 lb 12.8 oz (96.5 kg)  06/28/22 225 lb 6.4 oz (102.2 kg)    Physical Exam Vitals and nursing note reviewed.  Constitutional:      General: She is awake. She is not in acute distress.    Appearance: Normal appearance. She is well-developed and well-groomed. She is obese. She is not ill-appearing or toxic-appearing.  HENT:     Head: Normocephalic.     Right Ear: Hearing and external ear normal.     Left Ear: Hearing and external ear normal.  Eyes:     General: Lids are normal.        Right eye: No discharge.        Left eye: No discharge.     Conjunctiva/sclera: Conjunctivae normal.     Pupils: Pupils are equal, round, and reactive to light.  Neck:     Thyroid:  No thyromegaly.     Vascular: No carotid bruit.  Cardiovascular:     Rate and Rhythm: Normal rate and regular rhythm.     Heart sounds: Normal heart sounds. No murmur heard.    No gallop.  Pulmonary:     Effort: Pulmonary effort is normal. No accessory muscle usage or respiratory distress.     Breath sounds: Normal breath sounds.  Abdominal:     General: Bowel sounds are normal. There is no distension.     Palpations: Abdomen is soft.     Tenderness: There is no abdominal tenderness.  Musculoskeletal:     Cervical back: Normal range of motion and neck supple.     Right lower leg: No edema.     Left lower leg: No edema.  Lymphadenopathy:     Cervical: No cervical adenopathy.  Skin:    General: Skin is warm and dry.  Neurological:     Mental Status:  She is alert and oriented to person, place, and time.     Deep Tendon Reflexes: Reflexes are normal and symmetric.     Reflex Scores:      Brachioradialis reflexes are 2+ on the right side and 2+ on the left side.      Patellar reflexes are 2+ on the right side and 2+ on the left side. Psychiatric:        Attention and Perception: Attention normal.        Mood and Affect: Mood normal.        Speech: Speech normal.        Behavior: Behavior normal. Behavior is cooperative.        Thought Content: Thought content normal.    Results for orders placed or performed in visit on 06/07/23  Comprehensive metabolic panel  Result Value Ref Range   Glucose 88 70 - 99 mg/dL   BUN 12 6 - 24 mg/dL   Creatinine, Ser 9.56 (H) 0.57 - 1.00 mg/dL   eGFR 57 (L) >21 HY/QMV/7.84   BUN/Creatinine Ratio 10 9 - 23   Sodium 142 134 - 144 mmol/L   Potassium 3.9 3.5 - 5.2 mmol/L   Chloride 105 96 - 106 mmol/L   CO2 23 20 - 29 mmol/L   Calcium 8.6 (L) 8.7 - 10.2 mg/dL   Total Protein 6.0 6.0 - 8.5 g/dL   Albumin 3.8 3.8 - 4.9 g/dL   Globulin, Total 2.2 1.5 - 4.5 g/dL   Bilirubin Total 0.2 0.0 - 1.2 mg/dL   Alkaline Phosphatase 131 (H) 44 - 121 IU/L   AST 43 (H) 0 - 40 IU/L   ALT 59 (H) 0 - 32 IU/L  CBC with Differential/Platelet  Result Value Ref Range   WBC 5.1 3.4 - 10.8 x10E3/uL   RBC 3.78 3.77 - 5.28 x10E6/uL   Hemoglobin 10.2 (L) 11.1 - 15.9 g/dL   Hematocrit 69.6 (L) 29.5 - 46.6 %   MCV 84 79 - 97 fL   MCH 27.0 26.6 - 33.0 pg   MCHC 32.2 31.5 - 35.7 g/dL   RDW 28.4 13.2 - 44.0 %   Platelets 184 150 - 450 x10E3/uL   Neutrophils 50 Not Estab. %   Lymphs 37 Not Estab. %   Monocytes 7 Not Estab. %   Eos 5 Not Estab. %   Basos 1 Not Estab. %   Neutrophils Absolute 2.6 1.4 - 7.0 x10E3/uL   Lymphocytes Absolute 1.9 0.7 - 3.1 x10E3/uL   Monocytes Absolute 0.4 0.1 -  0.9 x10E3/uL   EOS (ABSOLUTE) 0.2 0.0 - 0.4 x10E3/uL   Basophils Absolute 0.0 0.0 - 0.2 x10E3/uL   Immature Granulocytes 0 Not  Estab. %   Immature Grans (Abs) 0.0 0.0 - 0.1 x10E3/uL   Hematology Comments: Note:       Assessment & Plan:   Problem List Items Addressed This Visit       Hematopoietic and Hemostatic   Thrombocytopenia (HCC) - Primary    Stable, has been normal on past few labs.  Monitor closely.      Relevant Orders   CBC with Differential/Platelet     Other   Elevated LFTs    New, noted on recent labs.  Her Terbinafine is on hold.  History of gallstones and still has gall bladder.  Check labs today: CMP, CBC, GGT.  Obtain RUQ ultrasound.  Overall no red flag symptoms.  Determine next steps after all testing returns.      Relevant Orders   Comprehensive metabolic panel   Gamma GT   US Abdomen Limited RUQ (LIVER/GB)   Low hemoglobin    New, noted on recent labs.  No red flag symptoms.  History of IDA.  Will recheck labs today and check iron.  FOBT was negative, she refuses colonoscopy.  If iron low, will start supplement and continue to recommend colonoscopy for screening.  Determine next steps after labs return.      Relevant Orders   Iron   Ferritin   CBC with Differential/Platelet     Follow up plan: Return for as scheduled in April.

## 2023-06-24 LAB — COMPREHENSIVE METABOLIC PANEL
ALT: 35 [IU]/L — ABNORMAL HIGH (ref 0–32)
AST: 35 [IU]/L (ref 0–40)
Albumin: 4.2 g/dL (ref 3.8–4.9)
Alkaline Phosphatase: 104 [IU]/L (ref 44–121)
BUN/Creatinine Ratio: 9 (ref 9–23)
BUN: 11 mg/dL (ref 6–24)
Bilirubin Total: <0.2 mg/dL (ref 0.0–1.2)
CO2: 24 mmol/L (ref 20–29)
Calcium: 9 mg/dL (ref 8.7–10.2)
Chloride: 106 mmol/L (ref 96–106)
Creatinine, Ser: 1.2 mg/dL — ABNORMAL HIGH (ref 0.57–1.00)
Globulin, Total: 2 g/dL (ref 1.5–4.5)
Glucose: 98 mg/dL (ref 70–99)
Potassium: 4.5 mmol/L (ref 3.5–5.2)
Sodium: 141 mmol/L (ref 134–144)
Total Protein: 6.2 g/dL (ref 6.0–8.5)
eGFR: 54 mL/min/{1.73_m2} — ABNORMAL LOW (ref >59)

## 2023-06-24 LAB — CBC WITH DIFFERENTIAL/PLATELET
Basophils Absolute: 0.1 10*3/uL (ref 0.0–0.2)
Basos: 1 %
EOS (ABSOLUTE): 0.3 10*3/uL (ref 0.0–0.4)
Eos: 5 %
Hematocrit: 34.8 % (ref 34.0–46.6)
Hemoglobin: 10.9 g/dL — ABNORMAL LOW (ref 11.1–15.9)
Immature Grans (Abs): 0 10*3/uL (ref 0.0–0.1)
Immature Granulocytes: 0 %
Lymphocytes Absolute: 1.5 10*3/uL (ref 0.7–3.1)
Lymphs: 26 %
MCH: 26.9 pg (ref 26.6–33.0)
MCHC: 31.3 g/dL — ABNORMAL LOW (ref 31.5–35.7)
MCV: 86 fL (ref 79–97)
Monocytes Absolute: 0.4 10*3/uL (ref 0.1–0.9)
Monocytes: 7 %
Neutrophils Absolute: 3.5 10*3/uL (ref 1.4–7.0)
Neutrophils: 61 %
Platelets: 276 10*3/uL (ref 150–450)
RBC: 4.05 x10E6/uL (ref 3.77–5.28)
RDW: 14.1 % (ref 11.7–15.4)
WBC: 5.9 10*3/uL (ref 3.4–10.8)

## 2023-06-24 LAB — FERRITIN: Ferritin: 19 ng/mL (ref 15–150)

## 2023-06-24 LAB — GAMMA GT: GGT: 27 [IU]/L (ref 0–60)

## 2023-06-24 LAB — IRON: Iron: 20 ug/dL — ABNORMAL LOW (ref 27–159)

## 2023-06-24 NOTE — Progress Notes (Signed)
Contacted via MyChart -- needs follow-up in 7 weeks with me please   Good morning Tracie James, your labs have returned: - There is some iron deficiency anemia, I would like you to start taking either Slow Fe or Vitron over the counter.  Take one tablet every day.  If you take Slow Fe take this with orange juice to help it absorb, Vitron has Vitamin C in it. If ongoing anemia we will need to send you to GI and possibly get a colonoscopy to figure out where it is coming from. - Kidney function continues to show Stage 3a kidney disease. - Liver function testing is a bit better, but AST is still up so hold off on taking the nail medication for now until after next visit. Any questions?  I am going to have staff call you to schedule a follow-up in 7 weeks in office with me.

## 2023-06-26 NOTE — Progress Notes (Signed)
Virtual Visit via Video Note  I connected with Tracie James on 07/01/23 at 11:30 AM EST by a video enabled telemedicine application and verified that I am speaking with the correct person using two identifiers.  Location: Patient: home Provider: office Persons participated in the visit- patient, provider    I discussed the limitations of evaluation and management by telemedicine and the availability of in person appointments. The patient expressed understanding and agreed to proceed.   I discussed the assessment and treatment plan with the patient. The patient was provided an opportunity to ask questions and all were answered. The patient agreed with the plan and demonstrated an understanding of the instructions.   The patient was advised to call back or seek an in-person evaluation if the symptoms worsen or if the condition fails to improve as anticipated.  I provided 20 minutes of non-face-to-face time during this encounter.   [name], MD    [facility] MD/PA/NP OP Progress Note  07/01/2023 12:14 PM Tracie James  MRN:  [id number]  Chief Complaint:  Chief Complaint  Patient presents with   Follow-up   HPI:  This is a follow-up appointment for depression, anxiety and insomnia.  She states that she has been doing okay.  She is ready for Christmas to be over.  Although she reports great relationship with her boyfriend, it is different without her child.  Her another daughter has moved out to be with her boyfriend.  She also has her stepmother, who is in the hospital due to necrotic pancreatitis.  She has been going back and forth between [place], and it has been difficult.  She likes the current work place, and has more fun.  Her boyfriend also commented how she is different at the current work place.  She was able to set the Christmas tree up.  Although she may feel anxious at times, it is much better compared to before.  She has not taken lorazepam for the past several months.   She has not taken Vraylar since around January due to issues with insurance.  She has fair sleep.  She denies change in appetite.  She denies SI.  She feels comfortable with the current medication regimen at this time.   Employment: Designer, industrial/product Support: boyfriend of ten years Household: boyfriend of 11 years in 2022 Marital status: divorced Number of children: 2 daughters, oldest is 54 year old, will move to [place]  Visit Diagnosis:    ICD-10-CM   1. MDD (major depressive disorder), recurrent episode, mild (HCC)  F33.0     2. Anxiety  F41.9     3. Insomnia, unspecified type  G47.00       Past Psychiatric History: Please see initial evaluation for full details. I have reviewed the history. No updates at this time.     Past Medical History:  Past Medical History:  Diagnosis Date   Anxiety    Asthma    Bipolar 2 disorder (HCC)    Depression    GERD (gastroesophageal reflux disease)    Palpitations    PVC (premature ventricular contraction)     Past Surgical History:  Procedure Laterality Date   APPENDECTOMY     TOTAL ABDOMINAL HYSTERECTOMY      Family Psychiatric History: Please see initial evaluation for full details. I have reviewed the history. No updates at this time.     Family History:  Family History  Problem Relation Age of Onset   Depression Mother    Multiple sclerosis Mother  Schizophrenia Maternal Grandmother    Heart disease Maternal Grandmother        aortic valve replacement    Social History:  Social History   Socioeconomic History   Marital status: Divorced    Spouse name: Not on file   Number of children: Not on file   Years of education: Not on file   Highest education level: Not on file  Occupational History   Not on file  Tobacco Use   Smoking status: Former    Current packs/day: 0.00    Types: Cigarettes    Quit date: 03/31/2014    Years since quitting: 9.2   Smokeless tobacco: Never  Vaping Use   Vaping status: Every Day  Substance  and Sexual Activity   Alcohol use: No   Drug use: No   Sexual activity: Yes    Partners: Male    Birth control/protection: Surgical  Other Topics Concern   Not on file  Social History Narrative   Not on file   Social Drivers of Health   Financial Resource Strain: Not on file  Food Insecurity: Not on file  Transportation Needs: Not on file  Physical Activity: Not on file  Stress: Not on file  Social Connections: Not on file    Allergies:  Allergies  Allergen Reactions   Erythromycin Hives   Tetracyclines & Related Hives    Metabolic Disorder Labs: Lab Results  Component Value Date   HGBA1C 5.6 04/25/2023   No results found for: "PROLACTIN" Lab Results  Component Value Date   CHOL 195 04/25/2023   TRIG 109 04/25/2023   HDL 66 04/25/2023   LDLCALC 110 (H) 04/25/2023   Lab Results  Component Value Date   TSH 1.980 04/25/2023   TSH 1.720 10/26/2021    Therapeutic Level Labs: No results found for: "LITHIUM" No results found for: "VALPROATE" No results found for: "CBMZ"  Current Medications: Current Outpatient Medications  Medication Sig Dispense Refill   b complex vitamins capsule Take 1 capsule by mouth daily.     buPROPion (WELLBUTRIN XL) 150 MG 24 hr tablet Take 1 tablet (150 mg total) by mouth daily. Take total of 450 mg daily. Along with 300 mg tab 90 tablet 1   buPROPion (WELLBUTRIN XL) 300 MG 24 hr tablet Take 1 tablet (300 mg total) by mouth daily. TAKE 1 TABLET DAILY AND 150 MG DAILY (TOTAL OF 450 MG) 90 tablet 1   busPIRone (BUSPAR) 5 MG tablet Take 1 tablet (5 mg total) by mouth 3 (three) times daily. 270 tablet 0   Coenzyme Q10 (COQ-10 PO) Take 1 capsule by mouth daily.     cyclobenzaprine (FLEXERIL) 10 MG tablet TAKE 1 TABLET BY MOUTH AT BEDTIME AS NEEDED FOR MUSCLE SPASMS. 30 tablet 1   FLUoxetine (PROZAC) 40 MG capsule Take 2 capsules (80 mg total) by mouth daily. 180 capsule 1   lansoprazole (PREVACID) 15 MG capsule Take 15 mg by mouth daily at  12 noon.     LORazepam (ATIVAN) 1 MG tablet Take 1 tablet (1 mg total) by mouth daily as needed for anxiety. 30 tablet 2   metoprolol succinate (TOPROL-XL) 50 MG 24 hr tablet Take 1 tablet (50 mg total) by mouth daily. Take with or immediately following a meal.Take with or immediately following a meal 90 tablet 1   naproxen (NAPROSYN) 500 MG tablet TAKE 1 TABLET BY MOUTH TWICE A DAY WITH FOOD 180 tablet 3   ondansetron (ZOFRAN) 4 MG tablet Take 1  tablet (4 mg total) by mouth every 8 (eight) hours as needed for nausea or vomiting. 30 tablet 4   terbinafine (LAMISIL) 250 MG tablet Take 250 mg orally once daily for 4 weeks, then off for 4 weeks, then resume with 250 mg orally once daily for 4 weeks. (Patient not taking: Reported on 06/23/2023) 70 tablet 0   traZODone (DESYREL) 50 MG tablet Take 0.5-1 tablets (25-50 mg total) by mouth at bedtime. 90 tablet 0   Vitamin D, Cholecalciferol, 1000 UNITS TABS Take 5,000 Units by mouth daily.      No current facility-administered medications for this visit.     Musculoskeletal: Strength & Muscle Tone:  N/A Gait & Station:  N/A Patient leans: N/A  Psychiatric Specialty Exam: Review of Systems  Psychiatric/Behavioral:  Negative for agitation, behavioral problems, confusion, decreased concentration, dysphoric mood, hallucinations, self-injury, sleep disturbance and suicidal ideas. The patient is nervous/anxious. The patient is not hyperactive.   All other systems reviewed and are negative.   There were no vitals taken for this visit.There is no height or weight on file to calculate BMI.  General Appearance: Well Groomed  Eye Contact:  Good  Speech:  Clear and Coherent  Volume:  Normal  Mood:   fine  Affect:  Appropriate, Congruent, and slightly down, but reactive  Thought Process:  Coherent  Orientation:  Full (Time, Place, and Person)  Thought Content: Logical   Suicidal Thoughts:  No  Homicidal Thoughts:  No  Memory:  Immediate;   Good   Judgement:  Good  Insight:  Good  Psychomotor Activity:  Normal  Concentration:  Concentration: Good and Attention Span: Good  Recall:  Good  Fund of Knowledge: Good  Language: Good  Akathisia:  No  Handed:  Right  AIMS (if indicated): not done  Assets:  Communication Skills Desire for Improvement  ADL's:  Intact  Cognition: WNL  Sleep:  Good   Screenings: GAD-7    Flowsheet Row Office Visit from 06/23/2023 in [facility] Office Visit from 04/25/2023 in [facility] Office Visit from 01/05/2022 in [facility] Office Visit from 11/16/2021 in [facility] Office Visit from 10/26/2021 in [facility]  Total GAD-7 Score 21 18 21 21 12       PHQ2-9    Flowsheet Row Office Visit from 06/23/2023 in [facility] Office Visit from 04/25/2023 in [facility] Office Visit from 01/05/2022 in [facility] Office Visit from 11/16/2021 in [facility] Office Visit from 10/26/2021 in [facility]  PHQ-2 Total Score 3 3 2 4 2   PHQ-9 Total Score 13 11 11 22 11       Flowsheet Row Office Visit from 08/31/2021 in [facility] ED from 07/09/2021 in [facility] Video Visit from 11/07/2020 in [facility]  C-SSRS RISK CATEGORY Low Risk No Risk Error: Q3, 4, or 5 should not be populated when Q2 is No        Assessment and Plan:  Demoni Quist is a 52 y.o. year old female with a history of depression, history of PVC, r/o seizure (not confirmed on 72 hour EEG), who presents for follow up appointment for below.   1. MDD (major depressive disorder), recurrent episode, mild (HCC) 2. Anxiety Acute stressors include:  Other stressors  include:  losing  custody of her 70 year old daughter, conflict with her ex husband  History:   Although she continues to experience self-limited anxiety and sadness due to issues around with her daughter, her mood has been more manageable over the past several months.  Will continue fluoxetine, bupropion to target depression.  Will continue BuSpar for anxiety.  Noted that she has not been taking Vraylar for the past several months due to issues with insurance.  Noted that while lorazepam is available, she has not needed this medication for the past few months.   3. Insomnia, unspecified type Overall improving.  Will continue current dose of trazodone as needed for insomnia.     Plan Continue fluoxetine 80 mg daily (monitor weight gain) Continue Bupropion 450 mg daily Continue Buspar 5 mg three times a day  Continue lorazepam 1 mg as needed for anxiety - she rarely takes this medication Continue Trazodone 25-50 mg at night as needed for insomnia Next appointment 2/5 at 4 pm for 30 mins, video - sleep study was not conclusive for sleep apnea in July 2023    Past trials of medication: Cymbalta, Zoloft, Prozac, bupropion, Lithium (sick), lamotrigine (weight gain), Depakote (spending money), Latuda, Abilify (did not work), quetiapine (weight gain, somnolence). Geodon (PVC occurred)      I have reviewed suicide assessment in detail. No change in the following assessment.    The patient demonstrates the following risk factors for suicide: Chronic risk factors for suicide include: psychiatric disorder of depression and previous suicide attempts of overdosing medication. Acute risk factors for suicide include: family or marital conflict. Protective factors for this patient include: responsibility to others (children, family), coping skills and hope for the future. Patient is future oriented and agrees to come for next appointment. Considering these factors, the overall suicide risk at this point appears to be low. She is  future oriented, and is amenable to treatment. Patient is appropriate for outpatient follow up.      Collaboration of Care: Collaboration of Care: Other reviewed notes in Epic  Patient/Guardian was advised Release of Information must be obtained prior to any record release in order to collaborate their care with an outside provider. Patient/Guardian was advised if they have not already done so to contact the registration department to sign all necessary forms in order for [place] to release information regarding their care.   Consent: Patient/Guardian gives verbal consent for treatment and assignment of benefits for services provided during this visit. Patient/Guardian expressed understanding and agreed to proceed.    [name], MD 07/01/2023, 12:14 PM

## 2023-06-27 ENCOUNTER — Ambulatory Visit
Admission: RE | Admit: 2023-06-27 | Discharge: 2023-06-27 | Disposition: A | Discharge status: Home / Self Care | Payer: Managed Care, Other (non HMO) | Source: Ambulatory Visit | Attending: Nurse Practitioner | Admitting: Nurse Practitioner

## 2023-06-27 DIAGNOSIS — R7989 Other specified abnormal findings of blood chemistry: Secondary | ICD-10-CM | POA: Diagnosis present

## 2023-06-27 NOTE — Progress Notes (Signed)
Contacted via MyChart   Good morning Tracie James, I have good news, your ultrasound returned and liver overall normal.  There are no gallstones.  You do have a 4 mm polyp on your gallbladder, but they report no follow-up is needed for this.  Any questions?  We will continue to monitor levels closely.  I wonder if nail medication caused the elevations. Keep being awesome!!  Thank you for allowing me to participate in your care.  I appreciate you. Kindest regards, Kade Demicco

## 2023-07-01 ENCOUNTER — Telehealth (INDEPENDENT_AMBULATORY_CARE_PROVIDER_SITE_OTHER): Payer: 59 | Admitting: Psychiatry

## 2023-07-01 ENCOUNTER — Encounter: Payer: Self-pay | Admitting: Psychiatry

## 2023-07-01 DIAGNOSIS — G4700 Insomnia, unspecified: Secondary | ICD-10-CM | POA: Diagnosis not present

## 2023-07-01 DIAGNOSIS — F419 Anxiety disorder, unspecified: Secondary | ICD-10-CM

## 2023-07-01 DIAGNOSIS — F330 Major depressive disorder, recurrent, mild: Secondary | ICD-10-CM | POA: Diagnosis not present

## 2023-07-01 MED ORDER — BUSPIRONE HCL 5 MG PO TABS: 5.0000 mg | ORAL_TABLET | Freq: Three times a day (TID) | ORAL | 0 refills | Status: DC

## 2023-07-01 NOTE — Patient Instructions (Signed)
Continue fluoxetine 80 mg daily  Continue Bupropion 450 mg daily Continue Buspar 5 mg three times a day  Continue lorazepam 1 mg as needed for anxiety  Continue Trazodone 25-50 mg at night as needed for insomnia Next appointment 2/5 at 4 pm

## 2023-08-08 DIAGNOSIS — D509 Iron deficiency anemia, unspecified: Secondary | ICD-10-CM | POA: Insufficient documentation

## 2023-08-08 NOTE — Patient Instructions (Signed)

## 2023-08-12 ENCOUNTER — Ambulatory Visit: Payer: Managed Care, Other (non HMO) | Admitting: Nurse Practitioner

## 2023-08-12 ENCOUNTER — Encounter: Payer: Self-pay | Admitting: Nurse Practitioner

## 2023-08-12 VITALS — BP 116/58 | HR 81 | Temp 97.3°F | Ht 63.5 in | Wt 206.0 lb

## 2023-08-12 DIAGNOSIS — F419 Anxiety disorder, unspecified: Secondary | ICD-10-CM

## 2023-08-12 DIAGNOSIS — F5101 Primary insomnia: Secondary | ICD-10-CM

## 2023-08-12 DIAGNOSIS — D696 Thrombocytopenia, unspecified: Secondary | ICD-10-CM

## 2023-08-12 DIAGNOSIS — D508 Other iron deficiency anemias: Secondary | ICD-10-CM

## 2023-08-12 DIAGNOSIS — F331 Major depressive disorder, recurrent, moderate: Secondary | ICD-10-CM

## 2023-08-12 NOTE — Assessment & Plan Note (Signed)
Chronic, followed by psychiatry.  Will continue current medication regimen as prescribed by psychiatry and review notes when available.

## 2023-08-12 NOTE — Assessment & Plan Note (Signed)
Chronic, ongoing.  Continue collaboration with psychiatry, recent notes reviewed.

## 2023-08-12 NOTE — Assessment & Plan Note (Signed)
Stable, has been normal on past few labs.  Monitor closely.

## 2023-08-12 NOTE — Assessment & Plan Note (Signed)
Noted on recent labs, recheck today.  Continue supplement at home and adjust as needed.

## 2023-08-12 NOTE — Progress Notes (Signed)
BP (!) 116/58   Pulse 81   Temp (!) 97.3 F (36.3 C) (Oral)   Ht 5' 3.5" (1.613 m)   Wt 206 lb (93.4 kg)   LMP  (LMP Unknown)   SpO2 98%   BMI 35.92 kg/m    Subjective:    Patient ID: Tracie James, female    DOB: 06/29/1971, 53 y.o.   MRN: [id number]  HPI: Tracie James is a 53 y.o. female  Chief Complaint  Patient presents with   Anemia   Depression   DEPRESSION/ANXIETY Follows with Dr. Vanetta Shawl, last visit 07/01/23.  Continues on Wellbutrin, Buspar, Prozac, Ativan, Trazodone.   Mood status: ongoing Satisfied with current treatment?: yes Symptom severity: moderate  Duration of current treatment : chronic Side effects: no Medication compliance: good compliance Psychotherapy/counseling: in past Previous psychiatric medications: multiple in past Depressed mood: sometimes Anxious mood: yes Anhedonia: no Significant weight loss or gain: no Insomnia: sleeping well, working 80 hours a week Fatigue: no Feelings of worthlessness or guilt: no Impaired concentration/indecisiveness: no Suicidal ideations: no Hopelessness: no Crying spells: no    08/12/2023    2:32 PM 06/23/2023    8:40 AM 04/25/2023    9:57 AM 01/05/2022    4:16 PM 11/16/2021    3:45 PM  Depression screen PHQ 2/9  Decreased Interest 2 2 2 1 2   Down, Depressed, Hopeless 1 1 1 1 2   PHQ - 2 Score 3 3 3 2 4   Altered sleeping 0 2 0 1 3  Tired, decreased energy 2 2 1 2 3   Change in appetite 0 2 3 3 3   Feeling bad or failure about yourself  2 3 2 1 2   Trouble concentrating 1 0 1 1 3   Moving slowly or fidgety/restless 2 0 1 0 2  Suicidal thoughts 1 1 0 1 2  PHQ-9 Score 11 13 11 11 22   Difficult doing work/chores Somewhat difficult Somewhat difficult Somewhat difficult Somewhat difficult        08/12/2023    2:33 PM 06/23/2023    8:41 AM 04/25/2023    9:58 AM 01/05/2022    4:16 PM  GAD 7 : Generalized Anxiety Score  Nervous, Anxious, on Edge 3 3 3 3   Control/stop worrying 3 3 1 3   Worry too much -  different things 3 3 3 3   Trouble relaxing 3 3 3 3   Restless 3 3 2 3   Easily annoyed or irritable 3 3 3 3   Afraid - awful might happen 3 3 3 3   Total GAD 7 Score 21 21 18 21   Anxiety Difficulty Somewhat difficult Somewhat difficult Somewhat difficult Somewhat difficult   ANEMIA Diagnosed 06/23/23 and is taking iron supplement. Is having hot to cold flashes after, no in between -- notices night time and first thing in the morning.;  Anemia status: stable Etiology of anemia: unknown Duration of anemia treatment: months Compliance with treatment: good compliance Iron supplementation side effects: no Severity of anemia: moderate Fatigue: no Decreased exercise tolerance: no  Dyspnea on exertion: no Palpitations: no Bleeding: no Pica: no   Relevant past medical, surgical, family and social history reviewed and updated as indicated. Interim medical history since our last visit reviewed. Allergies and medications reviewed and updated.  Review of Systems  Constitutional:  Negative for activity change, appetite change, diaphoresis, fatigue and fever.  Respiratory:  Negative for cough, chest tightness and shortness of breath.   Cardiovascular:  Negative for chest pain, palpitations and leg swelling.  Gastrointestinal:  Negative.   Neurological: Negative.   Psychiatric/Behavioral:  Positive for decreased concentration and sleep disturbance. Negative for self-injury and suicidal ideas. The patient is nervous/anxious.    Per HPI unless specifically indicated above     Objective:    BP (!) 116/58   Pulse 81   Temp (!) 97.3 F (36.3 C) (Oral)   Ht 5' 3.5" (1.613 m)   Wt 206 lb (93.4 kg)   LMP  (LMP Unknown)   SpO2 98%   BMI 35.92 kg/m   Wt Readings from Last 3 Encounters:  08/12/23 206 lb (93.4 kg)  06/23/23 214 lb (97.1 kg)  04/25/23 212 lb 12.8 oz (96.5 kg)    Physical Exam Vitals and nursing note reviewed.  Constitutional:      General: She is awake. She is not in acute  distress.    Appearance: She is well-developed and well-groomed. She is obese. She is not ill-appearing or toxic-appearing.  HENT:     Head: Normocephalic.     Right Ear: Hearing and external ear normal.     Left Ear: Hearing and external ear normal.  Eyes:     General: Lids are normal.        Right eye: No discharge.        Left eye: No discharge.     Conjunctiva/sclera: Conjunctivae normal.     Pupils: Pupils are equal, round, and reactive to light.  Neck:     Thyroid: No thyromegaly.     Vascular: No carotid bruit.  Cardiovascular:     Rate and Rhythm: Normal rate and regular rhythm.     Heart sounds: Normal heart sounds. No murmur heard.    No gallop.  Pulmonary:     Effort: Pulmonary effort is normal. No accessory muscle usage or respiratory distress.     Breath sounds: Normal breath sounds.  Abdominal:     General: Bowel sounds are normal. There is no distension.     Palpations: Abdomen is soft.     Tenderness: There is no abdominal tenderness.  Musculoskeletal:     Cervical back: Normal range of motion and neck supple.     Right lower leg: No edema.     Left lower leg: No edema.  Lymphadenopathy:     Cervical: No cervical adenopathy.  Skin:    General: Skin is warm and dry.  Neurological:     Mental Status: She is alert and oriented to person, place, and time.     Deep Tendon Reflexes: Reflexes are normal and symmetric.     Reflex Scores:      Brachioradialis reflexes are 2+ on the right side and 2+ on the left side.      Patellar reflexes are 2+ on the right side and 2+ on the left side. Psychiatric:        Attention and Perception: Attention normal.        Mood and Affect: Mood normal.        Speech: Speech normal.        Behavior: Behavior normal. Behavior is cooperative.        Thought Content: Thought content normal.    Results for orders placed or performed in visit on 06/23/23  Iron   Collection Time: 06/23/23  9:00 AM  Result Value Ref Range   Iron  20 (L) 27 - 159 ug/dL  Ferritin   Collection Time: 06/23/23  9:00 AM  Result Value Ref Range   Ferritin 19 15 - 150  ng/mL  CBC with Differential/Platelet   Collection Time: 06/23/23  9:00 AM  Result Value Ref Range   WBC 5.9 3.4 - 10.8 x10E3/uL   RBC 4.05 3.77 - 5.28 x10E6/uL   Hemoglobin 10.9 (L) 11.1 - 15.9 g/dL   Hematocrit 75.6 43.3 - 46.6 %   MCV 86 79 - 97 fL   MCH 26.9 26.6 - 33.0 pg   MCHC 31.3 (L) 31.5 - 35.7 g/dL   RDW 29.5 18.8 - 41.6 %   Platelets 276 150 - 450 x10E3/uL   Neutrophils 61 Not Estab. %   Lymphs 26 Not Estab. %   Monocytes 7 Not Estab. %   Eos 5 Not Estab. %   Basos 1 Not Estab. %   Neutrophils Absolute 3.5 1.4 - 7.0 x10E3/uL   Lymphocytes Absolute 1.5 0.7 - 3.1 x10E3/uL   Monocytes Absolute 0.4 0.1 - 0.9 x10E3/uL   EOS (ABSOLUTE) 0.3 0.0 - 0.4 x10E3/uL   Basophils Absolute 0.1 0.0 - 0.2 x10E3/uL   Immature Granulocytes 0 Not Estab. %   Immature Grans (Abs) 0.0 0.0 - 0.1 x10E3/uL  Comprehensive metabolic panel   Collection Time: 06/23/23  9:00 AM  Result Value Ref Range   Glucose 98 70 - 99 mg/dL   BUN 11 6 - 24 mg/dL   Creatinine, Ser 6.06 (H) 0.57 - 1.00 mg/dL   eGFR 54 (L) >30 ZS/[facility]U/9.32   BUN/Creatinine Ratio 9 9 - 23   Sodium 141 134 - 144 mmol/L   Potassium 4.5 3.5 - 5.2 mmol/L   Chloride 106 96 - 106 mmol/L   CO2 24 20 - 29 mmol/L   Calcium 9.0 8.7 - 10.2 mg/dL   Total Protein 6.2 6.0 - 8.5 g/dL   Albumin 4.2 3.8 - 4.9 g/dL   Globulin, Total 2.0 1.5 - 4.5 g/dL   Bilirubin Total <3.5 0.0 - 1.2 mg/dL   Alkaline Phosphatase 104 44 - 121 IU/L   AST 35 0 - 40 IU/L   ALT 35 (H) 0 - 32 IU/L  Gamma GT   Collection Time: 06/23/23  9:00 AM  Result Value Ref Range   GGT 27 0 - 60 IU/L      Assessment & Plan:   Problem List Items Addressed This Visit       Hematopoietic and Hemostatic   Thrombocytopenia (HCC)   Stable, has been normal on past few labs.  Monitor closely.        Other   Anxiety   Chronic, followed by psychiatry.   Will continue current medication regimen as prescribed by psychiatry and review notes when available.      Insomnia   Chronic, ongoing.  Continue collaboration with psychiatry, recent notes reviewed.      Iron deficiency anemia   Noted on recent labs, recheck today.  Continue supplement at home and adjust as needed.      Relevant Orders   CBC with Differential/Platelet   Ferritin   Iron   Major depressive disorder, recurrent episode, moderate (HCC) - Primary   Chronic, followed by psychiatry.  Will continue current medication regimen as prescribed by psychiatry and continue to review their notes.          Follow up plan: Return in about 8 months (around 04/25/2024) for Annual Physical.

## 2023-08-12 NOTE — Assessment & Plan Note (Signed)
Chronic, followed by psychiatry.  Will continue current medication regimen as prescribed by psychiatry and continue to review their notes.

## 2023-08-13 ENCOUNTER — Encounter: Payer: Self-pay | Admitting: Nurse Practitioner

## 2023-08-13 ENCOUNTER — Other Ambulatory Visit: Payer: Self-pay | Admitting: Nurse Practitioner

## 2023-08-13 LAB — CBC WITH DIFFERENTIAL/PLATELET
Basophils Absolute: 0.1 10*3/uL (ref 0.0–0.2)
Basos: 1 %
EOS (ABSOLUTE): 0.3 10*3/uL (ref 0.0–0.4)
Eos: 4 %
Hematocrit: 33.9 % — ABNORMAL LOW (ref 34.0–46.6)
Hemoglobin: 11.1 g/dL (ref 11.1–15.9)
Immature Grans (Abs): 0 10*3/uL (ref 0.0–0.1)
Immature Granulocytes: 0 %
Lymphocytes Absolute: 1.9 10*3/uL (ref 0.7–3.1)
Lymphs: 28 %
MCH: 26.6 pg (ref 26.6–33.0)
MCHC: 32.7 g/dL (ref 31.5–35.7)
MCV: 81 fL (ref 79–97)
Monocytes Absolute: 0.5 10*3/uL (ref 0.1–0.9)
Monocytes: 7 %
Neutrophils Absolute: 4.1 10*3/uL (ref 1.4–7.0)
Neutrophils: 60 %
Platelets: 237 10*3/uL (ref 150–450)
RBC: 4.18 x10E6/uL (ref 3.77–5.28)
RDW: 15.4 % (ref 11.7–15.4)
WBC: 6.8 10*3/uL (ref 3.4–10.8)

## 2023-08-13 LAB — FERRITIN: Ferritin: 26 ng/mL (ref 15–150)

## 2023-08-13 LAB — IRON: Iron: 30 ug/dL (ref 27–159)

## 2023-08-13 MED ORDER — SLOW IRON 160 (50 FE) MG PO TBCR: 1.0000 | EXTENDED_RELEASE_TABLET | Freq: Every day | ORAL | 1 refills | Status: DC

## 2023-08-13 NOTE — Progress Notes (Signed)
Contacted via MyChart -- however does not consistently check, please call to ensure she receives message: Good morning Tracie James, your labs have returned and anemia is improving - hemoglobin and hematocrit trending to normal levels and iron trending up.  However, iron still on lower side of normal.  I am going to send in an iron supplement I would like you to take as it has a little Vitamin C in it which helps your body absorb the iron.  I would also try to take it separate in the morning from your other medications by at least 30 minutes.  Any questions? We will recheck levels next visit. Keep being stellar!!  Thank you for allowing me to participate in your care.  I appreciate you. Kindest regards, Azel Gumina

## 2023-08-20 NOTE — Progress Notes (Signed)
 Virtual Visit via Video Note  I connected with Tracie James on 08/24/23 at  4:00 PM EST by a video enabled telemedicine application and verified that I am speaking with the correct person using two identifiers.  Location: Patient: home Provider: office Persons participated in the visit- patient, provider    I discussed the limitations of evaluation and management by telemedicine and the availability of in person appointments. The patient expressed understanding and agreed to proceed.     I discussed the assessment and treatment plan with the patient. The patient was provided an opportunity to ask questions and all were answered. The patient agreed with the plan and demonstrated an understanding of the instructions.   The patient was advised to call back or seek an in-person evaluation if the symptoms worsen or if the condition fails to improve as anticipated.    Katheren Sleet, MD    [facility] MD/PA/NP OP Progress Note  08/24/2023 4:36 PM Tracie James  MRN:  [id number]  Chief Complaint:  Chief Complaint  Patient presents with   Follow-up   HPI:  This is a follow-up appointment for depression, anxiety and insomnia.  She states that she is doing well.  She really enjoys the work and her coworkers.  She is preparing for baby shower/depression for one of her coworkers.  She feels so much better that people are doing their work.  They laugh a lot, and time flies so soon.  She is planning to attend a graduation ceremony for her daughter, Tracie James.  She has notified her ex-husband that she will be coming.  Although she does not know if Tracie James knows it, she wants to be there.  Her boyfriend has been very supported.  They raised Tracie James from age [age].  She had intense anxiety and had to take clonazepam when she saw a picture of her 2 daughters.  She is doing good otherwise.  She sleeps well.  She feels good about weight loss since discontinuation of lamotrigine .  She denies SI.  She feels comfortable to  stay on the current medication regimen.   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 MDD (major depressive disorder), recurrent, in partial remission (HCC)  F33.41     2. Anxiety  F41.9     3. Insomnia, unspecified type  G47.00       Past Psychiatric History: Please see initial evaluation for full details. I have reviewed the history. No updates at this time.     Past Medical History:  Past Medical History:  Diagnosis Date   Anxiety    Asthma    Bipolar 2 disorder (HCC)    Depression    GERD (gastroesophageal reflux disease)    Palpitations    PVC (premature ventricular contraction)     Past Surgical History:  Procedure Laterality Date   APPENDECTOMY     TOTAL ABDOMINAL HYSTERECTOMY      Family Psychiatric History: Please see initial evaluation for full details. I have reviewed the history. No updates at this time.     Family History:  Family History  Problem Relation Age of Onset   Depression Mother  Multiple sclerosis Mother    Schizophrenia Maternal Grandmother    Heart disease Maternal Grandmother        aortic valve replacement    Social History:  Social History   Socioeconomic History   Marital status: Divorced    Spouse name: Not on file   Number of children: Not on file   Years of education: Not on file   Highest education level: Not on file  Occupational History   Not on file  Tobacco Use   Smoking status: Former    Current packs/day: 0.00    Types: Cigarettes    Quit date: 03/31/2014    Years since quitting: 9.4   Smokeless tobacco: Never  Vaping Use   Vaping status: Every Day  Substance and Sexual Activity   Alcohol use:  No   Drug use: No   Sexual activity: Yes    Partners: Male    Birth control/protection: Surgical  Other Topics Concern   Not on file  Social History Narrative   Not on file   Social Drivers of Health   Financial Resource Strain: Not on file  Food Insecurity: Not on file  Transportation Needs: Not on file  Physical Activity: Not on file  Stress: Not on file  Social Connections: Not on file    Allergies:  Allergies  Allergen Reactions   Erythromycin Hives   Tetracyclines & Related Hives    Metabolic Disorder Labs: Lab Results  Component Value Date   HGBA1C 5.6 04/25/2023   No results found for: PROLACTIN Lab Results  Component Value Date   CHOL 195 04/25/2023   TRIG 109 04/25/2023   HDL 66 04/25/2023   LDLCALC 110 (H) 04/25/2023   Lab Results  Component Value Date   TSH 1.980 04/25/2023   TSH 1.720 10/26/2021    Therapeutic Level Labs: No results found for: LITHIUM No results found for: VALPROATE No results found for: CBMZ  Current Medications: Current Outpatient Medications  Medication Sig Dispense Refill   b complex vitamins capsule Take 1 capsule by mouth daily.     buPROPion  (WELLBUTRIN  XL) 150 MG 24 hr tablet Take 1 tablet (150 mg total) by mouth daily. Take total of 450 mg daily. Along with 300 mg tab 90 tablet 1   buPROPion  (WELLBUTRIN  XL) 300 MG 24 hr tablet Take 1 tablet (300 mg total) by mouth daily. TAKE 1 TABLET DAILY AND 150 MG DAILY (TOTAL OF 450 MG) 90 tablet 1   [START ON 09/29/2023] busPIRone  (BUSPAR ) 5 MG tablet Take 1 tablet (5 mg total) by mouth 3 (three) times daily. 270 tablet 0   Coenzyme Q10 (COQ-10 PO) Take 1 capsule by mouth daily.     cyclobenzaprine  (FLEXERIL ) 10 MG tablet TAKE 1 TABLET BY MOUTH AT BEDTIME AS NEEDED FOR MUSCLE SPASMS. 30 tablet 1   ferrous sulfate  (SLOW IRON ) 160 (50 Fe) MG TBCR SR tablet Take 1 tablet (160 mg total) by mouth daily. 90 tablet 1   FLUoxetine  (PROZAC ) 40 MG capsule Take 2 capsules (80 mg  total) by mouth daily. 180 capsule 1   lansoprazole (PREVACID) 15 MG capsule Take 15 mg by mouth daily at 12 noon.     LORazepam  (ATIVAN ) 1 MG tablet Take 1 tablet (1 mg total) by mouth daily as needed for anxiety. 30 tablet 2   metoprolol  succinate (TOPROL -XL) 50 MG 24 hr tablet Take 1 tablet (50 mg total) by mouth daily. Take with or immediately following a meal.Take with or immediately following  a meal 90 tablet 1   naproxen  (NAPROSYN ) 500 MG tablet TAKE 1 TABLET BY MOUTH TWICE A DAY WITH FOOD 180 tablet 3   ondansetron  (ZOFRAN ) 4 MG tablet Take 1 tablet (4 mg total) by mouth every 8 (eight) hours as needed for nausea or vomiting. 30 tablet 4   [START ON 08/28/2023] traZODone  (DESYREL ) 50 MG tablet Take 0.5-1 tablets (25-50 mg total) by mouth at bedtime. 90 tablet 0   UNABLE TO FIND Take 1 capsule by mouth daily. Cinnamon/Tumeric/Geinseng/Apple Cider Vinegar     Vitamin D , Cholecalciferol , 1000 UNITS TABS Take 5,000 Units by mouth daily.      No current facility-administered medications for this visit.     Musculoskeletal: Strength & Muscle Tone:  N/A Gait & Station:  N/A Patient leans: N/A  Psychiatric Specialty Exam: Review of Systems  Psychiatric/Behavioral:  Negative for agitation, behavioral problems, confusion, decreased concentration, dysphoric mood, hallucinations, self-injury, sleep disturbance and suicidal ideas. The patient is nervous/anxious. The patient is not hyperactive.   All other systems reviewed and are negative.   There were no vitals taken for this visit.There is no height or weight on file to calculate BMI.  General Appearance: Well Groomed  Eye Contact:  Good  Speech:  Clear and Coherent  Volume:  Normal  Mood:   good  Affect:  Appropriate, Congruent, and Tearful  Thought Process:  Coherent  Orientation:  Full (Time, Place, and Person)  Thought Content: Logical   Suicidal Thoughts:  No  Homicidal Thoughts:  No  Memory:  Immediate;   Good  Judgement:  Good   Insight:  Good  Psychomotor Activity:  Normal  Concentration:  Concentration: Good and Attention Span: Good  Recall:  Good  Fund of Knowledge: Good  Language: Good  Akathisia:  No  Handed:  Right  AIMS (if indicated): not done  Assets:  Communication Skills Desire for Improvement  ADL's:  Intact  Cognition: WNL  Sleep:  Good   Screenings: GAD-7    Flowsheet Row Office Visit from 08/12/2023 in [facility] Office Visit from 06/23/2023 in [facility] Office Visit from 04/25/2023 in [facility] Office Visit from 01/05/2022 in [facility] Office Visit from 11/16/2021 in [facility]  Total GAD-7 Score 21 21 18 21 21       PHQ2-9    Flowsheet Row Office Visit from 08/12/2023 in [facility] Office Visit from 06/23/2023 in [facility] Office Visit from 04/25/2023 in [facility] Office Visit from 01/05/2022 in [facility] Office Visit from 11/16/2021 in [facility]  PHQ-2 Total Score 3 3 3 2 4   PHQ-9 Total Score 11 13 11 11 22       Flowsheet Row Office Visit from 08/31/2021 in [facility] ED from 07/09/2021 in [facility] Video Visit from 11/07/2020 in [facility]  C-SSRS RISK CATEGORY Low Risk No Risk Error: Q3, 4, or 5 should not be populated when Q2 is No        Assessment and Plan:  Tracie James is a 52 y.o. year old female with a history of depression, history of PVC, r/o seizure (not confirmed on 72 hour EEG), who presents for follow up appointment for below.   1. MDD (major depressive disorder), recurrent, in partial  remission (HCC) 2. Anxiety Acute stressors include:  Other stressors include:  losing custody of her  31 year old daughter, conflict with her ex husband  History:   Although she continues to experience sadness due to issues outlined her daughter, her mood has been steadily improving over the past few months since starting a new job.  Will continue fluoxetine  and bupropion  to target depression.  Will continue BuSpar  for anxiety.  Will continue lorazepam  as needed for extreme anxiety.   3. Insomnia, unspecified type Stable.  Will continue current dose of trazodone  as needed for insomnia.   # High risk medication use  She rarely fills lorazepam , and 1 prescription last for 1 year.  Will consider UDS in the future visit.     Plan Continue fluoxetine  80 mg daily Continue Bupropion  450 mg daily Continue Buspar  5 mg three times a day  Continue lorazepam  1 mg as needed for anxiety - she rarely takes this medication Continue Trazodone  25-50 mg at night as needed for insomnia Next appointment 4/4 at 10 am for 30 mins, video - sleep study was not conclusive for sleep apnea in July 2023    Past trials of medication: Cymbalta, Zoloft, Prozac , bupropion , Lithium (sick), lamotrigine  (weight gain), Depakote (spending money), Latuda , Abilify  (did not work), quetiapine (weight gain, somnolence). Geodon  (PVC occurred)   The patient demonstrates the following risk factors for suicide: Chronic risk factors for suicide include: psychiatric disorder of depression and previous suicide attempts of overdosing medication. Acute risk factors for suicide include: family or marital conflict. Protective factors for this patient include: responsibility to others (children, family), coping skills and hope for the future. Patient is future oriented and agrees to come for next appointment. Considering these factors, the overall suicide risk at this point appears to be low. She is future oriented, and is amenable to treatment. Patient is appropriate for outpatient follow up.      Collaboration of Care: Collaboration of Care:  Other reviewed notes in Epic  Patient/Guardian was advised Release of Information must be obtained prior to any record release in order to collaborate their care with an outside provider. Patient/Guardian was advised if they have not already done so to contact the registration department to sign all necessary forms in order for us  to release information regarding their care.   Consent: Patient/Guardian gives verbal consent for treatment and assignment of benefits for services provided during this visit. Patient/Guardian expressed understanding and agreed to proceed.    Katheren Sleet, MD 08/24/2023, 4:36 PM

## 2023-08-24 ENCOUNTER — Telehealth: Payer: 59 | Admitting: Psychiatry

## 2023-08-24 ENCOUNTER — Encounter: Payer: Self-pay | Admitting: Psychiatry

## 2023-08-24 DIAGNOSIS — F3341 Major depressive disorder, recurrent, in partial remission: Secondary | ICD-10-CM | POA: Diagnosis not present

## 2023-08-24 DIAGNOSIS — F419 Anxiety disorder, unspecified: Secondary | ICD-10-CM | POA: Diagnosis not present

## 2023-08-24 DIAGNOSIS — G4700 Insomnia, unspecified: Secondary | ICD-10-CM

## 2023-08-24 MED ORDER — BUSPIRONE HCL 5 MG PO TABS: 5.0000 mg | ORAL_TABLET | Freq: Three times a day (TID) | ORAL | 0 refills | Status: DC

## 2023-08-24 MED ORDER — TRAZODONE HCL 50 MG PO TABS: 25.0000 mg | ORAL_TABLET | Freq: Every day | ORAL | 0 refills | Status: DC

## 2023-08-24 NOTE — Patient Instructions (Addendum)
 Continue fluoxetine  80 mg daily Continue Bupropion  450 mg daily Continue Buspar  5 mg three times a day  Continue lorazepam  1 mg as needed for anxiety  Continue Trazodone  25-50 mg at night as needed for insomnia Next appointment 4/4 at 10 am

## 2023-08-29 ENCOUNTER — Other Ambulatory Visit: Payer: Self-pay | Admitting: Psychiatry

## 2023-08-29 DIAGNOSIS — F330 Major depressive disorder, recurrent, mild: Secondary | ICD-10-CM

## 2023-08-29 DIAGNOSIS — F419 Anxiety disorder, unspecified: Secondary | ICD-10-CM

## 2023-09-22 DIAGNOSIS — F419 Anxiety disorder, unspecified: Secondary | ICD-10-CM

## 2023-09-22 DIAGNOSIS — F3341 Major depressive disorder, recurrent, in partial remission: Secondary | ICD-10-CM

## 2023-09-22 MED ORDER — BUSPIRONE HCL 10 MG PO TABS: 10.0000 mg | ORAL_TABLET | Freq: Three times a day (TID) | ORAL | 0 refills | Status: DC

## 2023-09-22 NOTE — Telephone Encounter (Signed)
 Called patient as instructed by provider she stated that she would like to try the increase of the Buspar 10 mg 3 times daily also verified pharmacy please advise.

## 2023-09-22 NOTE — Telephone Encounter (Signed)
 I have sent BuSpar 10 mg 3 times a day to pharmacy.  Will route this message to Dr. Vanetta Shawl also to address once she is back in office.

## 2023-09-22 NOTE — Telephone Encounter (Signed)
 Patient voiced understanding.

## 2023-09-25 ENCOUNTER — Other Ambulatory Visit: Payer: Self-pay | Admitting: Nurse Practitioner

## 2023-09-28 ENCOUNTER — Encounter: Payer: Self-pay | Admitting: Nurse Practitioner

## 2023-10-15 NOTE — Progress Notes (Unsigned)
 Virtual Visit via Video Note  I connected with Tracie James on 10/21/23 at 10:00 AM EDT by a video enabled telemedicine application and verified that I am speaking with the correct person using two identifiers.  Location: Patient: home Provider: office Persons participated in the visit- patient, provider    I discussed the limitations of evaluation and management by telemedicine and the availability of in person appointments. The patient expressed understanding and agreed to proceed.    I discussed the assessment and treatment plan with the patient. The patient was provided an opportunity to ask questions and all were answered. The patient agreed with the plan and demonstrated an understanding of the instructions.   The patient was advised to call back or seek an in-person evaluation if the symptoms worsen or if the condition fails to improve as anticipated.   Tracie Hotter, MD     [facility] MD/PA/NP OP Progress Note  10/21/2023 10:34 AM Tracie James  MRN:  [id number]  Chief Complaint:  Chief Complaint  Patient presents with   Follow-up   HPI:  This is a follow-up appointment for depression, anxiety and insomnia.  -Since the last visit, the BuSpar was up titrated.   She states that there has been a rough patch.  She reached out to the office that she could not concentrate at work..  Things are running through her head, and it did not stop.  She had crying spells, although it has been slightly better since uptitration of this. She thinks it took the edge off.  She will be driving to [place] for graduation.  She does not have confidence in what she is doing.  She wonders what if there is confrontation from her husband or her daughter.  Although she denies SI, she wants to run away.  She feels angry that her other daughter is not going there.  She tries not to say things about the past, but just want to hug her.  She reports significant difficulty in concentration at work.  She also  wonders whether she should leave the work.  Other people are faster, and has better life.  She acknowledges that what she is telling me now is completely the opposite of what she reported during the previous visit.  She has fair sleep.  She has good appetite.  She has intense anxiety, and had taken lorazepam a few times.  She denies SI.  She prefers to stay on the current medication regimen until she is over with a graduation.   Visit Diagnosis:    ICD-10-CM   1. MDD (major depressive disorder), recurrent episode, mild (HCC)  F33.0     2. Anxiety  F41.9     3. Insomnia, unspecified type  G47.00       Past Psychiatric History: Please see initial evaluation for full details. I have reviewed the history. No updates at this time.     Past Medical History:  Past Medical History:  Diagnosis Date   Anxiety    Asthma    Bipolar 2 disorder (HCC)    Depression    GERD (gastroesophageal reflux disease)    Palpitations    PVC (premature ventricular contraction)     Past Surgical History:  Procedure Laterality Date   APPENDECTOMY     TOTAL ABDOMINAL HYSTERECTOMY      Family Psychiatric History: Please see initial evaluation for full details. I have reviewed the history. No updates at this time.     Family History:  Family History  Problem Relation Age of Onset   Depression Mother    Multiple sclerosis Mother    Schizophrenia Maternal Grandmother    Heart disease Maternal Grandmother        aortic valve replacement    Social History:  Social History   Socioeconomic History   Marital status: Divorced    Spouse name: Not on file   Number of children: Not on file   Years of education: Not on file   Highest education level: Not on file  Occupational History   Not on file  Tobacco Use   Smoking status: Former    Current packs/day: 0.00    Types: Cigarettes    Quit date: 03/31/2014    Years since quitting: 9.5   Smokeless tobacco: Never  Vaping Use   Vaping status: Every  Day  Substance and Sexual Activity   Alcohol use: No   Drug use: No   Sexual activity: Yes    Partners: Male    Birth control/protection: Surgical  Other Topics Concern   Not on file  Social History Narrative   Not on file   Social Drivers of Health   Financial Resource Strain: Not on file  Food Insecurity: Not on file  Transportation Needs: Not on file  Physical Activity: Not on file  Stress: Not on file  Social Connections: Not on file    Allergies:  Allergies  Allergen Reactions   Erythromycin Hives   Tetracyclines & Related Hives    Metabolic Disorder Labs: Lab Results  Component Value Date   HGBA1C 5.6 04/25/2023   No results found for: "PROLACTIN" Lab Results  Component Value Date   CHOL 195 04/25/2023   TRIG 109 04/25/2023   HDL 66 04/25/2023   LDLCALC 110 (H) 04/25/2023   Lab Results  Component Value Date   TSH 1.980 04/25/2023   TSH 1.720 10/26/2021    Therapeutic Level Labs: No results found for: "LITHIUM" No results found for: "VALPROATE" No results found for: "CBMZ"  Current Medications: Current Outpatient Medications  Medication Sig Dispense Refill   b complex vitamins capsule Take 1 capsule by mouth daily.     buPROPion (WELLBUTRIN XL) 150 MG 24 hr tablet Take 1 tablet (150 mg total) by mouth daily. Take total of 450 mg daily. Along with 300 mg tab 90 tablet 1   buPROPion (WELLBUTRIN XL) 300 MG 24 hr tablet Take 1 tablet (300 mg total) by mouth daily. TAKE 1 TABLET DAILY AND 150 MG DAILY (TOTAL OF 450 MG) 90 tablet 1   busPIRone (BUSPAR) 10 MG tablet Take 1 tablet (10 mg total) by mouth 3 (three) times daily. 90 tablet 0   Coenzyme Q10 (COQ-10 PO) Take 1 capsule by mouth daily.     cyclobenzaprine (FLEXERIL) 10 MG tablet TAKE 1 TABLET BY MOUTH AT BEDTIME AS NEEDED FOR MUSCLE SPASMS. 30 tablet 1   ferrous sulfate (SLOW IRON) 160 (50 Fe) MG TBCR SR tablet Take 1 tablet (160 mg total) by mouth daily. 90 tablet 1   FLUoxetine (PROZAC) 40 MG  capsule Take 2 capsules (80 mg total) by mouth daily. 180 capsule 1   lansoprazole (PREVACID) 15 MG capsule Take 15 mg by mouth daily at 12 noon.     LORazepam (ATIVAN) 1 MG tablet Take 1 tablet (1 mg total) by mouth daily as needed for anxiety. 30 tablet 2   metoprolol succinate (TOPROL-XL) 50 MG 24 hr tablet Take 1 tablet (50 mg total) by mouth daily. Take with  or immediately following a meal.Take with or immediately following a meal 90 tablet 1   naproxen (NAPROSYN) 500 MG tablet TAKE 1 TABLET BY MOUTH TWICE A DAY WITH FOOD 180 tablet 3   ondansetron (ZOFRAN) 4 MG tablet Take 1 tablet (4 mg total) by mouth every 8 (eight) hours as needed for nausea or vomiting. 30 tablet 4   traZODone (DESYREL) 50 MG tablet Take 0.5-1 tablets (25-50 mg total) by mouth at bedtime. 90 tablet 0   UNABLE TO FIND Take 1 capsule by mouth daily. Cinnamon/Tumeric/Geinseng/Apple Cider Vinegar     Vitamin D, Cholecalciferol, 1000 UNITS TABS Take 5,000 Units by mouth daily.      No current facility-administered medications for this visit.     Musculoskeletal: Strength & Muscle Tone:  N/A Gait & Station:  N/A Patient leans: N/A  Psychiatric Specialty Exam: Review of Systems  Psychiatric/Behavioral:  Positive for dysphoric mood and sleep disturbance. Negative for agitation, behavioral problems, confusion, decreased concentration, hallucinations, self-injury and suicidal ideas. The patient is nervous/anxious. The patient is not hyperactive.   All other systems reviewed and are negative.   There were no vitals taken for this visit.There is no height or weight on file to calculate BMI.  General Appearance: Well Groomed  Eye Contact:  Good  Speech:  Clear and Coherent  Volume:  Normal  Mood:  Anxious  Affect:  Appropriate, Congruent, and Tearful  Thought Process:  Coherent  Orientation:  Full (Time, Place, and Person)  Thought Content: Logical   Suicidal Thoughts:  No  Homicidal Thoughts:  No  Memory:   Immediate;   Good  Judgement:  Good  Insight:  Good  Psychomotor Activity:  Normal  Concentration:  Concentration: Good and Attention Span: Good  Recall:  Good  Fund of Knowledge: Good  Language: Good  Akathisia:  No  Handed:  Right  AIMS (if indicated): not done  Assets:  Communication Skills Desire for Improvement  ADL's:  Intact  Cognition: WNL  Sleep:  Fair   Screenings: GAD-7    Flowsheet Row Office Visit from 08/12/2023 in [facility] Office Visit from 06/23/2023 in [facility] Office Visit from 04/25/2023 in [facility] Office Visit from 01/05/2022 in [facility] Office Visit from 11/16/2021 in [facility]  Total GAD-7 Score 21 21 18 21 21       PHQ2-9    Flowsheet Row Office Visit from 08/12/2023 in [facility] Office Visit from 06/23/2023 in [facility] Office Visit from 04/25/2023 in [facility] Office Visit from 01/05/2022 in [facility] Office Visit from 11/16/2021 in [facility]  PHQ-2 Total Score 3 3 3 2 4   PHQ-9 Total Score 11 13 11 11 22       Flowsheet Row Office Visit from 08/31/2021 in [facility] ED from 07/09/2021 in [facility] Video Visit from 11/07/2020 in [facility]  C-SSRS RISK CATEGORY Low Risk No Risk Error: Q3, 4, or 5 should not be populated when Q2 is No        Assessment and Plan:  Tracie James is a 53 y.o. year old female with a history of depression, history of PVC, r/o seizure (not confirmed on 72 hour EEG), who presents for follow up appointment for below.  1. MDD (major depressive disorder), recurrent episode, mild (HCC) 2. Anxiety Acute stressors include: upcoming graduation  of her daughter  Other stressors include:  losing custody of her 50 year old daughter, conflict with her ex husband  History:   The exam is notable for tearfulness, and there has been significant worsening in anxiety, depressive symptoms in the context of upcoming graduation of her daughter, which has been interfering her ability to work. Although she may benefit from further uptitration of BuSpar, she prefers to stay on the current medication regimen for now.  Will continue fluoxetine and bupropion to target depression.  Will continue BuSpar for anxiety, along with lorazepam as needed for anxiety.   3. Insomnia, unspecified type Overall stable.  Will continue current dose of trazodone as needed for insomnia.   # High risk medication use  She rarely fills lorazepam, and 1 prescription lasts for 1 year.  Will consider screening if it becomes more frequent use.     Plan Continue fluoxetine 80 mg daily Continue Bupropion 450 mg daily Continue Buspar 10 mg three times a day  Continue lorazepam 1 mg as needed for anxiety - she rarely takes this medication Continue Trazodone 25-50 mg at night as needed for insomnia Next appointment 5/16 at 11 am, video - sleep study was not conclusive for sleep apnea in July 2023    Past trials of medication: Cymbalta, Zoloft, Prozac, bupropion, Lithium (sick), lamotrigine (weight gain), Depakote (spending money), Latuda, Abilify (did not work), quetiapine (weight gain, somnolence). Geodon (PVC occurred)   The patient demonstrates the following risk factors for suicide: Chronic risk factors for suicide include: psychiatric disorder of depression and previous suicide attempts of overdosing medication. Acute risk factors for suicide include: family or marital conflict. Protective factors for this patient include: responsibility to others (children, family), coping skills and hope for the future. Patient is future oriented and agrees to come for next appointment.  Considering these factors, the overall suicide risk at this point appears to be low. She is future oriented, and is amenable to treatment. Patient is appropriate for outpatient follow up.    Collaboration of Care: Collaboration of Care: Other reviewed notes in Epic  Patient/Guardian was advised Release of Information must be obtained prior to any record release in order to collaborate their care with an outside provider. Patient/Guardian was advised if they have not already done so to contact the registration department to sign all necessary forms in order for [place] to release information regarding their care.   Consent: Patient/Guardian gives verbal consent for treatment and assignment of benefits for services provided during this visit. Patient/Guardian expressed understanding and agreed to proceed.    Tracie Hotter, MD 10/21/2023, 10:34 AM

## 2023-10-21 ENCOUNTER — Telehealth: Payer: 59 | Admitting: Psychiatry

## 2023-10-21 ENCOUNTER — Encounter: Payer: Self-pay | Admitting: Psychiatry

## 2023-10-21 DIAGNOSIS — F330 Major depressive disorder, recurrent, mild: Secondary | ICD-10-CM

## 2023-10-21 DIAGNOSIS — G4700 Insomnia, unspecified: Secondary | ICD-10-CM

## 2023-10-21 DIAGNOSIS — F419 Anxiety disorder, unspecified: Secondary | ICD-10-CM | POA: Diagnosis not present

## 2023-10-21 DIAGNOSIS — F3341 Major depressive disorder, recurrent, in partial remission: Secondary | ICD-10-CM

## 2023-10-21 MED ORDER — BUPROPION HCL ER (XL) 300 MG PO TB24: 300.0000 mg | ORAL_TABLET | Freq: Every day | ORAL | 1 refills | Status: DC

## 2023-10-21 MED ORDER — BUPROPION HCL ER (XL) 150 MG PO TB24: 150.0000 mg | ORAL_TABLET | Freq: Every day | ORAL | 1 refills | Status: DC

## 2023-10-21 MED ORDER — TRAZODONE HCL 50 MG PO TABS: 25.0000 mg | ORAL_TABLET | Freq: Every day | ORAL | 0 refills | Status: DC

## 2023-10-21 MED ORDER — BUSPIRONE HCL 10 MG PO TABS: 10.0000 mg | ORAL_TABLET | Freq: Three times a day (TID) | ORAL | 0 refills | Status: DC

## 2023-10-21 MED ORDER — FLUOXETINE HCL 40 MG PO CAPS: 80.0000 mg | ORAL_CAPSULE | Freq: Every day | ORAL | 1 refills | Status: DC

## 2023-10-21 NOTE — Patient Instructions (Signed)
 Continue fluoxetine 80 mg daily Continue Bupropion 450 mg daily Continue Buspar 5 mg three times a day  Continue lorazepam 1 mg as needed for anxiety  Continue Trazodone 25-50 mg at night as needed for insomnia Next appointment 5/16 at 11 am,

## 2023-10-24 ENCOUNTER — Ambulatory Visit: Payer: Self-pay | Admitting: Nurse Practitioner

## 2023-10-24 DIAGNOSIS — G40909 Epilepsy, unspecified, not intractable, without status epilepticus: Secondary | ICD-10-CM

## 2023-10-24 DIAGNOSIS — M797 Fibromyalgia: Secondary | ICD-10-CM

## 2023-10-24 DIAGNOSIS — E66813 Obesity, class 3: Secondary | ICD-10-CM

## 2023-10-24 DIAGNOSIS — D696 Thrombocytopenia, unspecified: Secondary | ICD-10-CM

## 2023-10-24 DIAGNOSIS — F419 Anxiety disorder, unspecified: Secondary | ICD-10-CM

## 2023-10-24 DIAGNOSIS — R7989 Other specified abnormal findings of blood chemistry: Secondary | ICD-10-CM

## 2023-10-24 DIAGNOSIS — F331 Major depressive disorder, recurrent, moderate: Secondary | ICD-10-CM

## 2023-10-24 DIAGNOSIS — D508 Other iron deficiency anemias: Secondary | ICD-10-CM

## 2023-10-30 NOTE — Patient Instructions (Signed)
 Managing Depression, Adult Depression is a mental health condition that affects your thoughts, feelings, and actions. Being diagnosed with depression can bring you relief if you did not know why you have felt or behaved a certain way. It could also leave

## 2023-11-04 ENCOUNTER — Encounter: Payer: Self-pay | Admitting: Nurse Practitioner

## 2023-11-04 ENCOUNTER — Ambulatory Visit: Payer: Self-pay | Admitting: Nurse Practitioner

## 2023-11-04 VITALS — BP 123/71 | HR 78 | Temp 97.8°F | Wt 212.0 lb

## 2023-11-04 DIAGNOSIS — Z6841 Body Mass Index (BMI) 40.0 and over, adult: Secondary | ICD-10-CM

## 2023-11-04 DIAGNOSIS — R600 Localized edema: Secondary | ICD-10-CM

## 2023-11-04 DIAGNOSIS — D696 Thrombocytopenia, unspecified: Secondary | ICD-10-CM | POA: Diagnosis not present

## 2023-11-04 DIAGNOSIS — E66813 Obesity, class 3: Secondary | ICD-10-CM | POA: Diagnosis not present

## 2023-11-04 DIAGNOSIS — M25471 Effusion, right ankle: Secondary | ICD-10-CM

## 2023-11-04 DIAGNOSIS — Z13220 Encounter for screening for lipoid disorders: Secondary | ICD-10-CM

## 2023-11-04 DIAGNOSIS — F331 Major depressive disorder, recurrent, moderate: Secondary | ICD-10-CM

## 2023-11-04 DIAGNOSIS — F5101 Primary insomnia: Secondary | ICD-10-CM

## 2023-11-04 DIAGNOSIS — M797 Fibromyalgia: Secondary | ICD-10-CM

## 2023-11-04 DIAGNOSIS — F419 Anxiety disorder, unspecified: Secondary | ICD-10-CM

## 2023-11-04 DIAGNOSIS — R7989 Other specified abnormal findings of blood chemistry: Secondary | ICD-10-CM

## 2023-11-04 DIAGNOSIS — E559 Vitamin D deficiency, unspecified: Secondary | ICD-10-CM

## 2023-11-04 DIAGNOSIS — G40909 Epilepsy, unspecified, not intractable, without status epilepticus: Secondary | ICD-10-CM

## 2023-11-04 DIAGNOSIS — D508 Other iron deficiency anemias: Secondary | ICD-10-CM

## 2023-11-04 DIAGNOSIS — M25472 Effusion, left ankle: Secondary | ICD-10-CM | POA: Insufficient documentation

## 2023-11-04 MED ORDER — ONDANSETRON HCL 4 MG PO TABS: 4.0000 mg | ORAL_TABLET | Freq: Three times a day (TID) | ORAL | 4 refills | Status: DC | PRN

## 2023-11-04 MED ORDER — GABAPENTIN 400 MG PO TABS: 400.0000 mg | ORAL_TABLET | Freq: Every day | ORAL | 11 refills | Status: DC

## 2023-11-04 NOTE — Assessment & Plan Note (Signed)
 Initial in 2009 and return in 2023 -- no further seizures since May 2023.  She questions if related to stressors.  Took herself off Lamictal  several  months ago and has been stable.  Continue collaboration with neurology as needed.  Labs today.  Restart medication as needed.

## 2023-11-04 NOTE — Assessment & Plan Note (Signed)
Chronic, ongoing.  Continue supplement and adjust as needed. ?

## 2023-11-04 NOTE — Assessment & Plan Note (Signed)
 Chronic, followed by psychiatry.  Will continue current medication regimen as prescribed by psychiatry and review notes when available.

## 2023-11-04 NOTE — Progress Notes (Signed)
 BP 123/71   Pulse 78   Temp 97.8 F (36.6 C) (Oral)   Wt 212 lb (96.2 kg)   LMP  (LMP Unknown)   SpO2 96%   BMI 36.97 kg/m    Subjective:    Patient ID: Tracie James, female    DOB: April 27, 1971, 53 y.o.   MRN: [id number]  HPI: Tracie James is a 53 y.o. female  Chief Complaint  Patient presents with   Anemia   Depression   ANEMIA Started iron  supplement in December 2024.  Stool was negative for blood. Continues Vitamin D  supplement daily.  Has been having edema to both ankles and a little bit up.  This has been an ongoing battle, prior to taking Gabapentin .  Takes Naprosyn  twice a day at baseline.  She does not think she puts a lot of salt into diet.  She tends to have edema every day, which improves with elevation and compression.  Anemia status: stable Etiology of anemia: unknown Duration of anemia treatment: months Compliance with treatment: good compliance Iron  supplementation side effects: no Severity of anemia: moderate Fatigue: yes Decreased exercise tolerance: no  Dyspnea on exertion: no Palpitations: no Bleeding: no Pica: no   DEPRESSION/ANXIETY/FIBROMYALGIA Follows with psychiatry, Dr. Edda Goo, last seen 10/21/23.  Also attends therapy sessions, last 08/06/22.  Continues on Wellbutrin , Buspar , Prozac , Trazodone , Ativan . History of seizure like activity 10/24/21 -- saw neurology last 01/20/22, no activity since.  She is going to [place]  soon to see her daughter graduate high school, whether she is wanted there or not.  Her husband took daughter away years ago and she has not seen her since.   Mood status: exacerbated Satisfied with current treatment?: yes Symptom severity: moderate  Duration of current treatment : chronic Side effects: no Medication compliance: good compliance Psychotherapy/counseling: yes current Depressed mood: no Anxious mood: yes Anhedonia: no Significant weight loss or gain: no Insomnia: sometimes -- due to hot flashes, gabapentin   initially worked and then symptoms returned at night Fatigue: yes Feelings of worthlessness or guilt: no Impaired concentration/indecisiveness: no Suicidal ideations: no Hopelessness: yes Crying spells: yes    11/04/2023    1:55 PM 08/12/2023    2:32 PM 06/23/2023    8:40 AM 04/25/2023    9:57 AM 01/05/2022    4:16 PM  Depression screen PHQ 2/9  Decreased Interest 1 2 2 2 1   Down, Depressed, Hopeless 1 1 1 1 1   PHQ - 2 Score 2 3 3 3 2   Altered sleeping 2 0 2 0 1  Tired, decreased energy 3 2 2 1 2   Change in appetite 2 0 2 3 3   Feeling bad or failure about yourself  2 2 3 2 1   Trouble concentrating 3 1 0 1 1  Moving slowly or fidgety/restless 3 2 0 1 0  Suicidal thoughts 1 1 1  0 1  PHQ-9 Score 18 11 13 11 11   Difficult doing work/chores Somewhat difficult Somewhat difficult Somewhat difficult Somewhat difficult Somewhat difficult       11/04/2023    1:54 PM 08/12/2023    2:33 PM 06/23/2023    8:41 AM 04/25/2023    9:58 AM  GAD 7 : Generalized Anxiety Score  Nervous, Anxious, on Edge 3 3 3 3   Control/stop worrying 3 3 3 1   Worry too much - different things 3 3 3 3   Trouble relaxing 3 3 3 3   Restless 3 3 3 2   Easily annoyed or irritable 3 3 3  3  Afraid - awful might happen 3 3 3 3   Total GAD 7 Score 21 21 21 18   Anxiety Difficulty Very difficult Somewhat difficult Somewhat difficult Somewhat difficult   Relevant past medical, surgical, family and social history reviewed and updated as indicated. Interim medical history since our last visit reviewed. Allergies and medications reviewed and updated.  Review of Systems  Constitutional:  Negative for activity change, appetite change, diaphoresis, fatigue and fever.  Respiratory:  Negative for cough, chest tightness and shortness of breath.   Cardiovascular:  Positive for leg swelling (ankles end of day). Negative for chest pain and palpitations.  Gastrointestinal: Negative.   Neurological: Negative.   Psychiatric/Behavioral:   Positive for decreased concentration and sleep disturbance. Negative for self-injury and suicidal ideas. The patient is nervous/anxious.     Per HPI unless specifically indicated above     Objective:    BP 123/71   Pulse 78   Temp 97.8 F (36.6 C) (Oral)   Wt 212 lb (96.2 kg)   LMP  (LMP Unknown)   SpO2 96%   BMI 36.97 kg/m   Wt Readings from Last 3 Encounters:  11/04/23 212 lb (96.2 kg)  08/12/23 206 lb (93.4 kg)  06/23/23 214 lb (97.1 kg)    Physical Exam Vitals and nursing note reviewed.  Constitutional:      General: She is awake. She is not in acute distress.    Appearance: She is well-developed and well-groomed. She is obese. She is not ill-appearing or toxic-appearing.  HENT:     Head: Normocephalic.     Right Ear: Hearing and external ear normal.     Left Ear: Hearing and external ear normal.  Eyes:     General: Lids are normal.        Right eye: No discharge.        Left eye: No discharge.     Conjunctiva/sclera: Conjunctivae normal.     Pupils: Pupils are equal, round, and reactive to light.  Neck:     Thyroid: No thyromegaly.     Vascular: No carotid bruit.  Cardiovascular:     Rate and Rhythm: Normal rate and regular rhythm.     Heart sounds: Normal heart sounds. No murmur heard.    No gallop.  Pulmonary:     Effort: Pulmonary effort is normal. No accessory muscle usage or respiratory distress.     Breath sounds: Normal breath sounds.  Abdominal:     General: Bowel sounds are normal. There is no distension.     Palpations: Abdomen is soft.     Tenderness: There is no abdominal tenderness.  Musculoskeletal:     Cervical back: Normal range of motion and neck supple.     Right lower leg: Edema (trace at ankle sock line) present.     Left lower leg: Edema (trace at ankle sock line) present.  Lymphadenopathy:     Cervical: No cervical adenopathy.  Skin:    General: Skin is warm and dry.  Neurological:     Mental Status: She is alert and oriented to  person, place, and time.     Deep Tendon Reflexes: Reflexes are normal and symmetric.     Reflex Scores:      Brachioradialis reflexes are 2+ on the right side and 2+ on the left side.      Patellar reflexes are 2+ on the right side and 2+ on the left side. Psychiatric:        Attention and Perception: Attention  normal.        Mood and Affect: Mood normal.        Speech: Speech normal.        Behavior: Behavior normal. Behavior is cooperative.        Thought Content: Thought content normal.    Results for orders placed or performed in visit on 08/12/23  CBC with Differential/Platelet   Collection Time: 08/12/23  3:22 PM  Result Value Ref Range   WBC 6.8 3.4 - 10.8 x10E3/uL   RBC 4.18 3.77 - 5.28 x10E6/uL   Hemoglobin 11.1 11.1 - 15.9 g/dL   Hematocrit 86.5 (L) 78.4 - 46.6 %   MCV 81 79 - 97 fL   MCH 26.6 26.6 - 33.0 pg   MCHC 32.7 31.5 - 35.7 g/dL   RDW 69.6 29.5 - 28.4 %   Platelets 237 150 - 450 x10E3/uL   Neutrophils 60 Not Estab. %   Lymphs 28 Not Estab. %   Monocytes 7 Not Estab. %   Eos 4 Not Estab. %   Basos 1 Not Estab. %   Neutrophils Absolute 4.1 1.4 - 7.0 x10E3/uL   Lymphocytes Absolute 1.9 0.7 - 3.1 x10E3/uL   Monocytes Absolute 0.5 0.1 - 0.9 x10E3/uL   EOS (ABSOLUTE) 0.3 0.0 - 0.4 x10E3/uL   Basophils Absolute 0.1 0.0 - 0.2 x10E3/uL   Immature Granulocytes 0 Not Estab. %   Immature Grans (Abs) 0.0 0.0 - 0.1 x10E3/uL  Ferritin   Collection Time: 08/12/23  3:22 PM  Result Value Ref Range   Ferritin 26 15 - 150 ng/mL  Iron    Collection Time: 08/12/23  3:22 PM  Result Value Ref Range   Iron  30 27 - 159 ug/dL      Assessment & Plan:   Problem List Items Addressed This Visit       Nervous and Auditory   Seizure disorder (HCC) - Primary   Initial in 2009 and return in 2023 -- no further seizures since May 2023.  She questions if related to stressors.  Took herself off Lamictal  several  months ago and has been stable.  Continue collaboration with neurology  as needed.  Labs today.  Restart medication as needed.      Relevant Medications   gabapentin  (NEURONTIN ) 400 MG tablet     Hematopoietic and Hemostatic   Thrombocytopenia (HCC)   Stable, has been normal on past few labs.  Monitor closely.      Relevant Orders   CBC with Differential/Platelet     Other   Vitamin D  deficiency   Chronic, ongoing.  Continue supplement and adjust as needed.      Relevant Orders   VITAMIN D  25 Hydroxy (Vit-D Deficiency, Fractures)   Obesity   BMI 36.97.  Recommended eating smaller high protein, low fat meals more frequently and exercising 30 mins a day 5 times a week with a goal of 10-15lb weight loss in the next 3 months. Patient voiced their understanding and motivation to adhere to these recommendations.        Major depressive disorder, recurrent episode, moderate (HCC)   Chronic, followed by psychiatry.  Will continue current medication regimen as prescribed by psychiatry and continue to review their notes.        Iron  deficiency anemia   Ongoing, continue iron  supplement and adjust regimen as needed.  Stool testing for negative for blood.  If ongoing low levels may need referral to hematology.      Relevant Orders  Ferritin   Iron    CBC with Differential/Platelet   Insomnia   Chronic, ongoing.  Continue collaboration with psychiatry, recent notes reviewed.      Fibromyalgia   Chronic, stable due to reduction in stressors.  Continue Naproxen  twice a day and Gabapentin , which is more for hot flashes.      Relevant Medications   gabapentin  (NEURONTIN ) 400 MG tablet   Other Relevant Orders   TSH   Vitamin B12   Edema of both ankles   Present on and off for long while at end of day. Improves with compression and elevation. Suspect may be related to Naprosyn  taken BID. Discussed with her action of this medication and relation edema.  Recommend she continue to wear compression daily.        Relevant Orders   Comprehensive metabolic  panel with GFR   Anxiety   Chronic, followed by psychiatry.  Will continue current medication regimen as prescribed by psychiatry and review notes when available.        Follow up plan: Return for as scheduled in September for physical.

## 2023-11-04 NOTE — Assessment & Plan Note (Signed)
 BMI 36.97.  Recommended eating smaller high protein, low fat meals more frequently and exercising 30 mins a day 5 times a week with a goal of 10-15lb weight loss in the next 3 months. Patient voiced their understanding and motivation to adhere to these recommendations.

## 2023-11-04 NOTE — Assessment & Plan Note (Signed)
 Chronic, ongoing.  Continue collaboration with psychiatry, recent notes reviewed.

## 2023-11-04 NOTE — Assessment & Plan Note (Signed)
 Stable, has been normal on past few labs.  Monitor closely.

## 2023-11-04 NOTE — Assessment & Plan Note (Signed)
 Chronic, stable due to reduction in stressors.  Continue Naproxen  twice a day and Gabapentin , which is more for hot flashes.

## 2023-11-04 NOTE — Assessment & Plan Note (Signed)
 Ongoing, continue iron  supplement and adjust regimen as needed.  Stool testing for negative for blood.  If ongoing low levels may need referral to hematology.

## 2023-11-04 NOTE — Assessment & Plan Note (Signed)
 Chronic, followed by psychiatry.  Will continue current medication regimen as prescribed by psychiatry and continue to review their notes.

## 2023-11-04 NOTE — Assessment & Plan Note (Signed)
 Present on and off for long while at end of day. Improves with compression and elevation. Suspect may be related to Naprosyn  taken BID. Discussed with her action of this medication and relation edema.  Recommend she continue to wear compression daily.

## 2023-11-05 ENCOUNTER — Encounter: Payer: Self-pay | Admitting: Nurse Practitioner

## 2023-11-05 LAB — CBC WITH DIFFERENTIAL/PLATELET
Basophils Absolute: 0.1 10*3/uL (ref 0.0–0.2)
Basos: 1 %
EOS (ABSOLUTE): 0.2 10*3/uL (ref 0.0–0.4)
Eos: 3 %
Hematocrit: 35 % (ref 34.0–46.6)
Hemoglobin: 11.4 g/dL (ref 11.1–15.9)
Immature Grans (Abs): 0 10*3/uL (ref 0.0–0.1)
Immature Granulocytes: 0 %
Lymphocytes Absolute: 1.5 10*3/uL (ref 0.7–3.1)
Lymphs: 24 %
MCH: 27 pg (ref 26.6–33.0)
MCHC: 32.6 g/dL (ref 31.5–35.7)
MCV: 83 fL (ref 79–97)
Monocytes Absolute: 0.4 10*3/uL (ref 0.1–0.9)
Monocytes: 6 %
Neutrophils Absolute: 4.2 10*3/uL (ref 1.4–7.0)
Neutrophils: 66 %
Platelets: 270 10*3/uL (ref 150–450)
RBC: 4.22 x10E6/uL (ref 3.77–5.28)
RDW: 14.9 % (ref 11.7–15.4)
WBC: 6.4 10*3/uL (ref 3.4–10.8)

## 2023-11-05 LAB — COMPREHENSIVE METABOLIC PANEL WITH GFR
ALT: 90 IU/L — ABNORMAL HIGH (ref 0–32)
AST: 68 IU/L — ABNORMAL HIGH (ref 0–40)
Albumin: 4.4 g/dL (ref 3.8–4.9)
Alkaline Phosphatase: 137 IU/L — ABNORMAL HIGH (ref 44–121)
BUN/Creatinine Ratio: 18 (ref 9–23)
BUN: 21 mg/dL (ref 6–24)
Bilirubin Total: <0.2 mg/dL (ref 0.0–1.2)
CO2: 19 mmol/L — ABNORMAL LOW (ref 20–29)
Calcium: 9.6 mg/dL (ref 8.7–10.2)
Chloride: 101 mmol/L (ref 96–106)
Creatinine, Ser: 1.18 mg/dL — ABNORMAL HIGH (ref 0.57–1.00)
Globulin, Total: 2.1 g/dL (ref 1.5–4.5)
Glucose: 97 mg/dL (ref 70–99)
Potassium: 4.9 mmol/L (ref 3.5–5.2)
Sodium: 136 mmol/L (ref 134–144)
Total Protein: 6.5 g/dL (ref 6.0–8.5)
eGFR: 55 mL/min/{1.73_m2} — ABNORMAL LOW (ref >59)

## 2023-11-05 LAB — VITAMIN B12: Vitamin B-12: >2000 pg/mL — ABNORMAL HIGH (ref 232–1245)

## 2023-11-05 LAB — VITAMIN D 25 HYDROXY (VIT D DEFICIENCY, FRACTURES): Vit D, 25-Hydroxy: 73.1 ng/mL (ref 30.0–100.0)

## 2023-11-05 LAB — TSH: TSH: 1.11 u[IU]/mL (ref 0.450–4.500)

## 2023-11-05 LAB — IRON: Iron: 26 ug/dL — ABNORMAL LOW (ref 27–159)

## 2023-11-05 LAB — FERRITIN: Ferritin: 37 ng/mL (ref 15–150)

## 2023-11-05 NOTE — Progress Notes (Signed)
 Contacted via MyChart   Good morning Tracie James, your labs have returned: - Hemoglobin and hematocrit are normal, as is ferritin, but iron  level is still on low side.  Are you taking iron  supplement daily? Not missing doses? Taking with orange juice? Let me know because goal is to get iron  level up which can help fatigue.  If remains low we may have to send you to hematology.  I want to recheck outpatient in 6 weeks to see if coming up. - Kidney function, creatinine and eGFR, still shows slightly lower levels.  I recommend cutting back on Naprosyn  use and we will recheck this in 6 weeks too.  Also increase water intake.  Liver function continues to show some elevations, trending up from last check.  We will continue to monitor this closely and may need to repeat ultrasound this year.  Avoid alcohol use. - Remainder of labs stable.  Any questions? Keep being amazing!!  Thank you for allowing me to participate in your care.  I appreciate you. Kindest regards, Muadh Creasy

## 2023-11-14 ENCOUNTER — Ambulatory Visit: Payer: Self-pay | Admitting: Nurse Practitioner

## 2023-11-16 NOTE — Progress Notes (Signed)
 Cardiology Clinic Note   Date: 11/17/2023 ID: Tracie James, DOB [birth year], MRN [id number]  Primary Cardiologist:  Constancia Delton, MD  Chief Complaint   Tracie James is a 53 y.o. female who presents to the clinic today for routine follow up.   Patient Profile   Tracie James is followed by Dr. Junnie Olives for the history outlined below.      Past medical history significant for: Palpitations/PVCs. Seizure disorder. Depression/anxiety. Fibromyalgia. Chronic pain syndrome.  In summary, patient was first evaluated by Dr. Junnie Olives on 01/30/2021 for palpitations.  She reported she underwent outpatient Holter monitoring for palpitations while in [place]  in 2009 and was found to have frequent PVCs.  She stated echo was done at that time which was normal.  She moved to [place]  in 2011 and continued to have symptoms of palpitations and was started on Toprol  by PCP.  She stopped taking Toprol  6 months prior and became symptomatic with palpitations.  She was restarted on Toprol  and symptoms resolved.  EKG demonstrated NSR.  No testing was indicated at the time of her visit.  Patient was last seen in the office by Ronald Cockayne, NP on 06/28/2022 for routine follow-up.  She was doing well at that time and no changes were made.     History of Present Illness    Today, patient reports in March she had 3 weeks of intermittent palpitations that lasted just a second and resolved on its own. She has had no further episodes since. She was not sick during that time but could have been experiencing increased stress. Patient denies shortness of breath, dyspnea on exertion, orthopnea or PND. No chest pain, pressure, or tightness. She reports lower extremity edema for the last 1.5 years. Lower extremities are normal in the morning and edema progresses throughout the day. She has tried compression stockings but they cut into the back of her leg by her knee so she could not tolerate them. She  does sit much of the day for her job at [employer]. She does not cook with salt but will add salt to her food at the table "but I am not loading it on." She does not follow a regular exercise routine.     ROS: All other systems reviewed and are otherwise negative except as noted in History of Present Illness.  EKGs/Labs Reviewed    EKG Interpretation Date/Time:  Thursday Nov 17 2023 14:17:34 EDT Ventricular Rate:  65 PR Interval:  200 QRS Duration:  80 QT Interval:  412 QTC Calculation: 428 R Axis:   0  Text Interpretation: Normal sinus rhythm Nonspecific ST abnormality When compared with ECG of 22-Jun-2022 15:18, No significant change was found Confirmed by Morey Ar [id number]) on 11/17/2023 2:25:40 PM   11/04/2023: ALT 90; AST 68; BUN 21; Creatinine, Ser 1.18; Potassium 4.9; Sodium 136   11/04/2023: Hemoglobin 11.4; WBC 6.4   11/04/2023: TSH 1.110    Physical Exam    VS:  BP 132/70 (BP Location: Left Arm, Patient Position: Sitting, Cuff Size: Large)   Pulse 67   Ht 5\' 4"  (1.626 m)   Wt 217 lb (98.4 kg)   LMP  (LMP Unknown)   SpO2 95%   BMI 37.25 kg/m  , BMI Body mass index is 37.25 kg/m.  GEN: Well nourished, well developed, in no acute distress. Neck: No JVD or carotid bruits. Cardiac:  RRR. Occasional extrasystole. No murmurs. No rubs or gallops.   Respiratory:  Respirations regular and unlabored. Clear to auscultation  without rales, wheezing or rhonchi. GI: Soft, nontender, nondistended. Extremities: Radials/DP/PT 2+ and equal bilaterally. No clubbing or cyanosis. Trace lower extremity edema bilaterally.   Skin: Warm and dry, no rash. Neuro: Strength intact.  Assessment & Plan   Palpitations/PVCs Patient has a long history of palpitations.  She reported being diagnosed with frequent PVCs after wearing a Holter monitor while living in [place]  in 2009.  In 2022 her Toprol  was inadvertently stopped and she became symptomatic with palpitations.  Metoprolol  was  restarted and symptoms resolved and have been well-controlled since.  Patient reports in March she had 3 weeks of intermittent palpitations lasting a second and resolving on its own. None palpitations since. She reports possibly being under increased stress during that time. EKG shows NSR today. Occasional extrasystole during exam. If palpitations become more persistent or frequent would consider outpatient monitoring.  - Patient is instructed to contact the office if palpitations increase in frequency, duration or intensity.  - Continue Toprol .  Lower extremity edema Patient reports a 1.5 year history of lower extremity edema. Legs are normal in the morning and edema progresses throughout the day. She sits with leg dependent much of the day with her job at [employer]. She was unable to tolerate compression socks, as they cut into the back of her leg. Discussed trying compression sleeves. She does not cook with salt but adds salt at the table. Suggested she measure what she is adding to ensure she is not overusing salt. She reports there is a long hallway at her job and suggested she walk it a couple of times a day to get some exercise and maybe improve edema. Trace lower extremity edema bilaterally on exam.  - Try compression sleeves.  - Limit salt intake.  - Increase physical activity as tolerated.   Disposition: Return in 1 year or sooner as needed.          Signed, Lonell Rives. Dontrail Blackwell, DNP, NP-C

## 2023-11-17 ENCOUNTER — Encounter: Payer: Self-pay | Admitting: Student

## 2023-11-17 ENCOUNTER — Ambulatory Visit: Attending: Student | Admitting: Student

## 2023-11-17 VITALS — BP 132/70 | HR 67 | Ht 64.0 in | Wt 217.0 lb

## 2023-11-17 DIAGNOSIS — R600 Localized edema: Secondary | ICD-10-CM

## 2023-11-17 DIAGNOSIS — R002 Palpitations: Secondary | ICD-10-CM

## 2023-11-17 DIAGNOSIS — I493 Ventricular premature depolarization: Secondary | ICD-10-CM | POA: Diagnosis not present

## 2023-11-17 MED ORDER — METOPROLOL SUCCINATE ER 50 MG PO TB24: 50.0000 mg | ORAL_TABLET | Freq: Every day | ORAL | 3 refills | Status: DC

## 2023-11-17 NOTE — Patient Instructions (Signed)
 Medication Instructions:  Your Physician recommend you continue on your current medication as directed.    *If you need a refill on your cardiac medications before your next appointment, please call your pharmacy*  Lab Work: None ordered at this time   Follow-Up: At [facility], you and your health needs are our priority.  As part of our continuing mission to provide you with exceptional heart care, our providers are all part of one team.  This team includes your primary Cardiologist (physician) and Advanced Practice Providers or APPs (Physician Assistants and Nurse Practitioners) who all work together to provide you with the care you need, when you need it.  Your next appointment:   1 year(s)  Provider:   You may see Debbe Odea, MD or Carlos Levering, NP

## 2023-11-23 ENCOUNTER — Other Ambulatory Visit: Payer: Self-pay | Admitting: Psychiatry

## 2023-11-23 ENCOUNTER — Encounter: Payer: Self-pay | Admitting: Psychiatry

## 2023-11-23 ENCOUNTER — Telehealth (INDEPENDENT_AMBULATORY_CARE_PROVIDER_SITE_OTHER): Payer: Self-pay | Admitting: Psychiatry

## 2023-11-23 DIAGNOSIS — F330 Major depressive disorder, recurrent, mild: Secondary | ICD-10-CM | POA: Diagnosis not present

## 2023-11-23 DIAGNOSIS — G4700 Insomnia, unspecified: Secondary | ICD-10-CM

## 2023-11-23 DIAGNOSIS — F419 Anxiety disorder, unspecified: Secondary | ICD-10-CM | POA: Diagnosis not present

## 2023-11-23 MED ORDER — LORAZEPAM 1 MG PO TABS: 1.0000 mg | ORAL_TABLET | Freq: Every day | ORAL | 0 refills | Status: AC | PRN

## 2023-11-23 NOTE — Progress Notes (Signed)
 Virtual Visit via Video Note  I connected with Tracie James on 11/23/23 at 11:00 AM EDT by a video enabled telemedicine application and verified that I am speaking with the correct person using two identifiers.  Location: Patient: work Provider: home office Persons participated in the visit- patient, provider    I discussed the limitations of evaluation and management by telemedicine and the availability of in person appointments. The patient expressed understanding and agreed to proceed.     I discussed the assessment and treatment plan with the patient. The patient was provided an opportunity to ask questions and all were answered. The patient agreed with the plan and demonstrated an understanding of the instructions.   The patient was advised to call back or seek an in-person evaluation if the symptoms worsen or if the condition fails to improve as anticipated.   Todd Fossa, MD    [facility] MD/PA/NP OP Progress Note  11/23/2023 12:14 PM Tracie James  MRN:  [id number]  Chief Complaint:  Chief Complaint  Patient presents with   Follow-up   HPI:  This is a follow-up appointment for depression, anxiety and insomnia.  She states that there were a lot of things.  She received a graduation take it, and the date was on May 16, which was different from what she was informed by her husband.  She also reports issues with tax credit, which she is unable to get.  She feels angry.  However, she does not feel angry as she used to.  She agrees that she has taken some control over things, which she thinks he would not like.  Tracie James, her daughter will be able to visit there on graduation.  Her boyfriend will be going there.  She reports good relationship with both of them.  She feels sad that it is not 4 of them anymore, although it was not bad considering how her boyfriend getting involved in their care. She acknowledges this loss.  She states that she has been able to focus better at work, and she  believes higher dose of BuSpar  has been helping.  She has insomnia.  She denies change in appetite.  She denies SI.  She denies panic attacks.  She denies alcohol use or drug use.  She feels comfortable to stay on the current medication regimen.  Visit Diagnosis:    ICD-10-CM   1. MDD (major depressive disorder), recurrent episode, mild (HCC)  F33.0     2. Anxiety  F41.9 LORazepam  (ATIVAN ) 1 MG tablet    3. Insomnia, unspecified type  G47.00       Past Psychiatric History: Please see initial evaluation for full details. I have reviewed the history. No updates at this time.     Past Medical History:  Past Medical History:  Diagnosis Date   Anxiety    Asthma    Bipolar 2 disorder (HCC)    Depression    GERD (gastroesophageal reflux disease)    Palpitations    PVC (premature ventricular contraction)     Past Surgical History:  Procedure Laterality Date   APPENDECTOMY     TOTAL ABDOMINAL HYSTERECTOMY      Family Psychiatric History: Please see initial evaluation for full details. I have reviewed the history. No updates at this time.     Family History:  Family History  Problem Relation Age of Onset   Depression Mother    Multiple sclerosis Mother    Schizophrenia Maternal Grandmother    Heart disease Maternal Grandmother  aortic valve replacement    Social History:  Social History   Socioeconomic History   Marital status: Divorced    Spouse name: Not on file   Number of children: Not on file   Years of education: Not on file   Highest education level: Not on file  Occupational History   Not on file  Tobacco Use   Smoking status: Former    Current packs/day: 0.00    Types: Cigarettes    Quit date: 03/31/2014    Years since quitting: 9.6   Smokeless tobacco: Never  Vaping Use   Vaping status: Every Day  Substance and Sexual Activity   Alcohol use: No   Drug use: No   Sexual activity: Yes    Partners: Male    Birth control/protection: Surgical   Other Topics Concern   Not on file  Social History Narrative   Not on file   Social Drivers of Health   Financial Resource Strain: Not on file  Food Insecurity: Not on file  Transportation Needs: Not on file  Physical Activity: Not on file  Stress: Not on file  Social Connections: Not on file    Allergies:  Allergies  Allergen Reactions   Erythromycin Hives   Tetracyclines & Related Hives    Metabolic Disorder Labs: Lab Results  Component Value Date   HGBA1C 5.6 04/25/2023   No results found for: "PROLACTIN" Lab Results  Component Value Date   CHOL 195 04/25/2023   TRIG 109 04/25/2023   HDL 66 04/25/2023   LDLCALC 110 (H) 04/25/2023   Lab Results  Component Value Date   TSH 1.110 11/04/2023   TSH 1.980 04/25/2023    Therapeutic Level Labs: No results found for: "LITHIUM" No results found for: "VALPROATE" No results found for: "CBMZ"  Current Medications: Current Outpatient Medications  Medication Sig Dispense Refill   amoxicillin (AMOXIL) 500 MG capsule Take 500 mg by mouth 3 (three) times daily.     b complex vitamins capsule Take 1 capsule by mouth daily.     [START ON 11/26/2023] buPROPion  (WELLBUTRIN  XL) 150 MG 24 hr tablet Take 1 tablet (150 mg total) by mouth daily. Take total of 450 mg daily. Along with 300 mg tab 90 tablet 1   [START ON 11/26/2023] buPROPion  (WELLBUTRIN  XL) 300 MG 24 hr tablet Take 1 tablet (300 mg total) by mouth daily. TAKE 1 TABLET DAILY AND 150 MG DAILY (TOTAL OF 450 MG) 90 tablet 1   busPIRone  (BUSPAR ) 10 MG tablet Take 1 tablet (10 mg total) by mouth 3 (three) times daily. 270 tablet 0   Coenzyme Q10 (COQ-10 PO) Take 1 capsule by mouth daily.     cyclobenzaprine  (FLEXERIL ) 10 MG tablet TAKE 1 TABLET BY MOUTH AT BEDTIME AS NEEDED FOR MUSCLE SPASMS. 30 tablet 1   ferrous sulfate (SLOW IRON ) 160 (50 Fe) MG TBCR SR tablet Take 1 tablet (160 mg total) by mouth daily. 90 tablet 1   [START ON 11/26/2023] FLUoxetine  (PROZAC ) 40 MG capsule  Take 2 capsules (80 mg total) by mouth daily. 180 capsule 1   gabapentin  (NEURONTIN ) 400 MG tablet Take 1 tablet (400 mg total) by mouth at bedtime. 30 tablet 11   lansoprazole (PREVACID) 15 MG capsule Take 15 mg by mouth daily at 12 noon.     LORazepam  (ATIVAN ) 1 MG tablet Take 1 tablet (1 mg total) by mouth daily as needed for anxiety. 30 tablet 0   metoprolol  succinate (TOPROL -XL) 50 MG 24 hr  tablet Take 1 tablet (50 mg total) by mouth daily. Take with or immediately following a meal.Take with or immediately following a meal 90 tablet 3   naproxen  (NAPROSYN ) 500 MG tablet TAKE 1 TABLET BY MOUTH TWICE A DAY WITH FOOD 180 tablet 3   ondansetron  (ZOFRAN ) 4 MG tablet Take 1 tablet (4 mg total) by mouth every 8 (eight) hours as needed for nausea or vomiting. 30 tablet 4   [START ON 11/26/2023] traZODone  (DESYREL ) 50 MG tablet Take 0.5-1 tablets (25-50 mg total) by mouth at bedtime. 90 tablet 0   UNABLE TO FIND Take 1 capsule by mouth daily. Cinnamon/Tumeric/Geinseng/Apple Cider Vinegar     Vitamin D , Cholecalciferol, 1000 UNITS TABS Take 5,000 Units by mouth daily.      No current facility-administered medications for this visit.     Musculoskeletal: Strength & Muscle Tone:  N/A Gait & Station:  N/A Patient leans: N/A  Psychiatric Specialty Exam: Review of Systems  Psychiatric/Behavioral:  Positive for decreased concentration, dysphoric mood and sleep disturbance. Negative for agitation, behavioral problems, confusion, hallucinations, self-injury and suicidal ideas. The patient is nervous/anxious. The patient is not hyperactive.   All other systems reviewed and are negative.   There were no vitals taken for this visit.There is no height or weight on file to calculate BMI.  General Appearance: Well Groomed  Eye Contact:  Good  Speech:  Clear and Coherent  Volume:  Normal  Mood:  Angry  Affect:  Appropriate, Congruent, and Tearful  Thought Process:  Coherent  Orientation:  Full (Time,  Place, and Person)  Thought Content: Logical   Suicidal Thoughts:  No  Homicidal Thoughts:  No  Memory:  Immediate;   Good  Judgement:  Good  Insight:  Good  Psychomotor Activity:  Normal  Concentration:  Concentration: Good and Attention Span: Good  Recall:  Good  Fund of Knowledge: Good  Language: Good  Akathisia:  No  Handed:  Right  AIMS (if indicated): not done  Assets:  Communication Skills Desire for Improvement  ADL's:  Intact  Cognition: WNL  Sleep:  Poor   Screenings: GAD-7    Flowsheet Row Office Visit from 11/04/2023 in [facility] Office Visit from 08/12/2023 in [facility] Office Visit from 06/23/2023 in [facility] Office Visit from 04/25/2023 in [facility] Office Visit from 01/05/2022 in [facility]  Total GAD-7 Score 21 21 21 18 21       PHQ2-9    Flowsheet Row Office Visit from 11/04/2023 in [facility] Office Visit from 08/12/2023 in [facility] Office Visit from 06/23/2023 in [facility] Office Visit from 04/25/2023 in [facility] Office Visit from 01/05/2022 in [facility]  PHQ-2 Total Score 2 3 3 3 2   PHQ-9 Total Score 18 11 13 11 11       Flowsheet Row Office Visit from 08/31/2021 in [facility] ED from 07/09/2021 in [facility] Video Visit from 11/07/2020 in [facility]  C-SSRS RISK CATEGORY Low Risk No Risk Error: Q3, 4, or 5 should not be populated when Q2 is No        Assessment and Plan:  Kathren Aceves is a 53 y.o. year old female with a history of depression, history of PVC, r/o seizure (not  confirmed on 72 hour EEG), who presents for follow up appointment for below.   1. Anxiety 2.  MDD (major depressive disorder), recurrent episode, mild (HCC) Acute stressors include: upcoming graduation of her daughter  Other stressors include:  losing custody of her 34 year old daughter, conflict with her ex husband  History:   Although the exam is notable for tearfulness, and continues to experience anxiety, her mood symptoms has been overall manageable since the last visit.  She continues to experience ongoing conflict with her husband, who gave her a different date for the graduation ceremony.  She reports good support from her boyfriend, and has close relationship with her oldest daughter.  Will continue current medication regimen.  Will continue fluoxetine  to target depression and anxiety.  Will continue bupropion  adjunctive treatment for depression, and BuSpar  for anxiety.  Will continue clonazepam as needed for anxiety.   3. Insomnia, unspecified type Although she has insomnia, it has been overall manageable.  Will continue trazodone  as needed for insomnia.     # High risk medication use  She rarely fills lorazepam , and 1 prescription lasts for 1 year.  Will consider screening if it becomes more frequent use.     Plan Continue fluoxetine  80 mg daily Continue Bupropion  450 mg daily Continue Buspar  10 mg three times a day  Continue lorazepam  1 mg as needed for anxiety - she rarely takes this medication Continue Trazodone  25-50 mg at night as needed for insomnia Next appointment - 6/5 - sleep study was not conclusive for sleep apnea in July 2023    Past trials of medication: Cymbalta, Zoloft, Prozac , bupropion , Lithium (sick), lamotrigine  (weight gain), Depakote (spending money), Latuda , Abilify (did not work), quetiapine (weight gain, somnolence). Geodon  (PVC occurred)   The patient demonstrates the following risk factors for suicide: Chronic risk factors for suicide include: psychiatric disorder of depression and previous suicide attempts of overdosing medication. Acute risk  factors for suicide include: family or marital conflict. Protective factors for this patient include: responsibility to others (children, family), coping skills and hope for the future. Patient is future oriented and agrees to come for next appointment. Considering these factors, the overall suicide risk at this point appears to be low. She is future oriented, and is amenable to treatment. Patient is appropriate for outpatient follow up.    Collaboration of Care: Collaboration of Care: Other reviewed notes in Epic  Patient/Guardian was advised Release of Information must be obtained prior to any record release in order to collaborate their care with an outside provider. Patient/Guardian was advised if they have not already done so to contact the registration department to sign all necessary forms in order for us  to release information regarding their care.   Consent: Patient/Guardian gives verbal consent for treatment and assignment of benefits for services provided during this visit. Patient/Guardian expressed understanding and agreed to proceed.    Todd Fossa, MD 11/23/2023, 12:14 PM

## 2023-11-25 ENCOUNTER — Other Ambulatory Visit: Payer: Self-pay | Admitting: Nurse Practitioner

## 2023-11-28 NOTE — Telephone Encounter (Signed)
 Requested Prescriptions  Refused Prescriptions Disp Refills   naproxen  (NAPROSYN ) 500 MG tablet [Pharmacy Med Name: NAPROXEN  500MG  TABLETS] 540 tablet     Sig: TAKE 1 TABLET BY MOUTH TWICE DAILY WITH FOOD     Analgesics:  NSAIDS Failed - 11/28/2023 12:26 PM      Failed - Manual Review: Labs are only required if the patient has taken medication for more than 8 weeks.      Failed - Cr in normal range and within 360 days    Creatinine, Ser  Date Value Ref Range Status  11/04/2023 1.18 (H) 0.57 - 1.00 mg/dL Final         Failed - Valid encounter within last 12 months    Recent Outpatient Visits           3 weeks ago Seizure disorder (HCC)   [facility] [place], Sanjuan Crumbly T, NP       Future Appointments             In 4 months Cannady, Jolene T, NP [facility]            Passed - HGB in normal range and within 360 days    Hemoglobin  Date Value Ref Range Status  11/04/2023 11.4 11.1 - 15.9 g/dL Final         Passed - PLT in normal range and within 360 days    Platelets  Date Value Ref Range Status  11/04/2023 270 150 - 450 x10E3/uL Final         Passed - HCT in normal range and within 360 days    Hematocrit  Date Value Ref Range Status  11/04/2023 35.0 34.0 - 46.6 % Final         Passed - eGFR is 30 or above and within 360 days    GFR calc Af Amer  Date Value Ref Range Status  12/30/2020 71  Final   GFR, Estimated  Date Value Ref Range Status  10/24/2021 49 (L) >60 mL/min Final    Comment:    (NOTE) Calculated using the CKD-EPI Creatinine Equation (2021)    eGFR  Date Value Ref Range Status  11/04/2023 55 (L) >59 mL/min/1.73 Final         Passed - Patient is not pregnant

## 2023-12-02 ENCOUNTER — Telehealth: Admitting: Psychiatry

## 2023-12-13 IMAGING — DX DG LUMBAR SPINE COMPLETE 4+V
5 series · 5 of 5 positions shown · non-contrast
Comparison: None.

CLINICAL DATA: Ongoing lower back pain.

EXAM:
LUMBAR SPINE - COMPLETE 4+ VIEW

[dg lumbar spine complete 4 +v (1 of 5)]
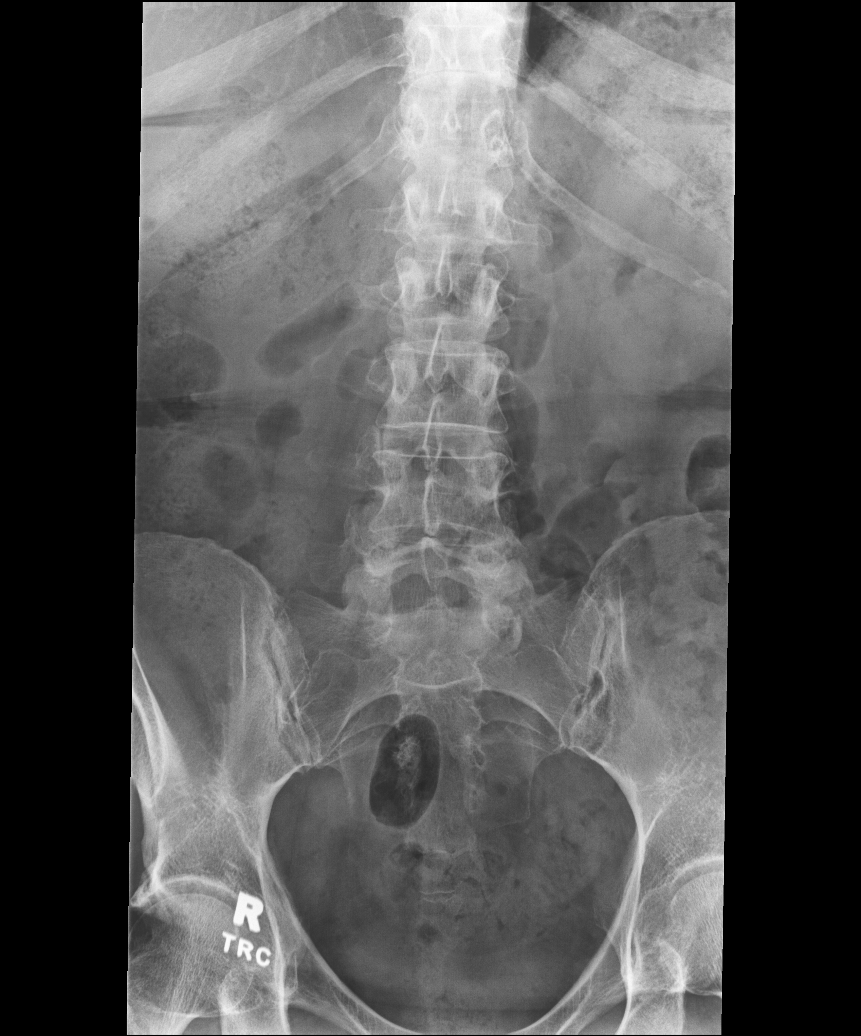

[dg lumbar spine complete 4 +v (2 of 5)]
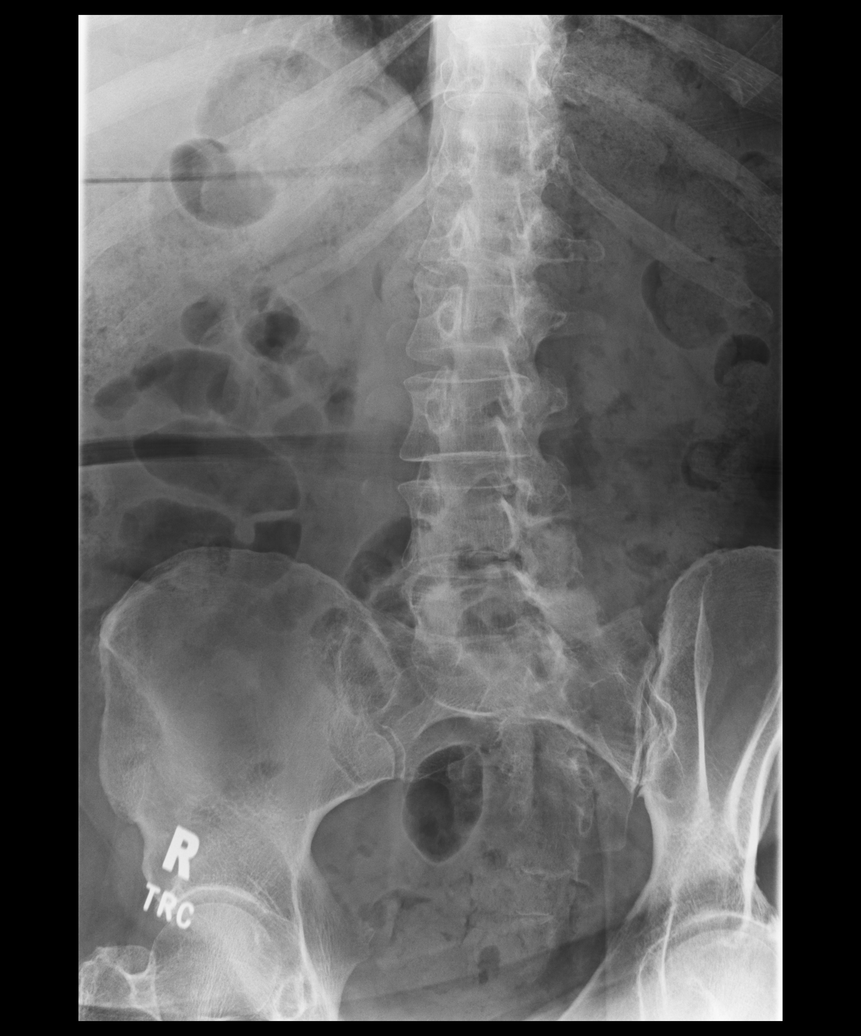

[dg lumbar spine complete 4 +v (3 of 5)]
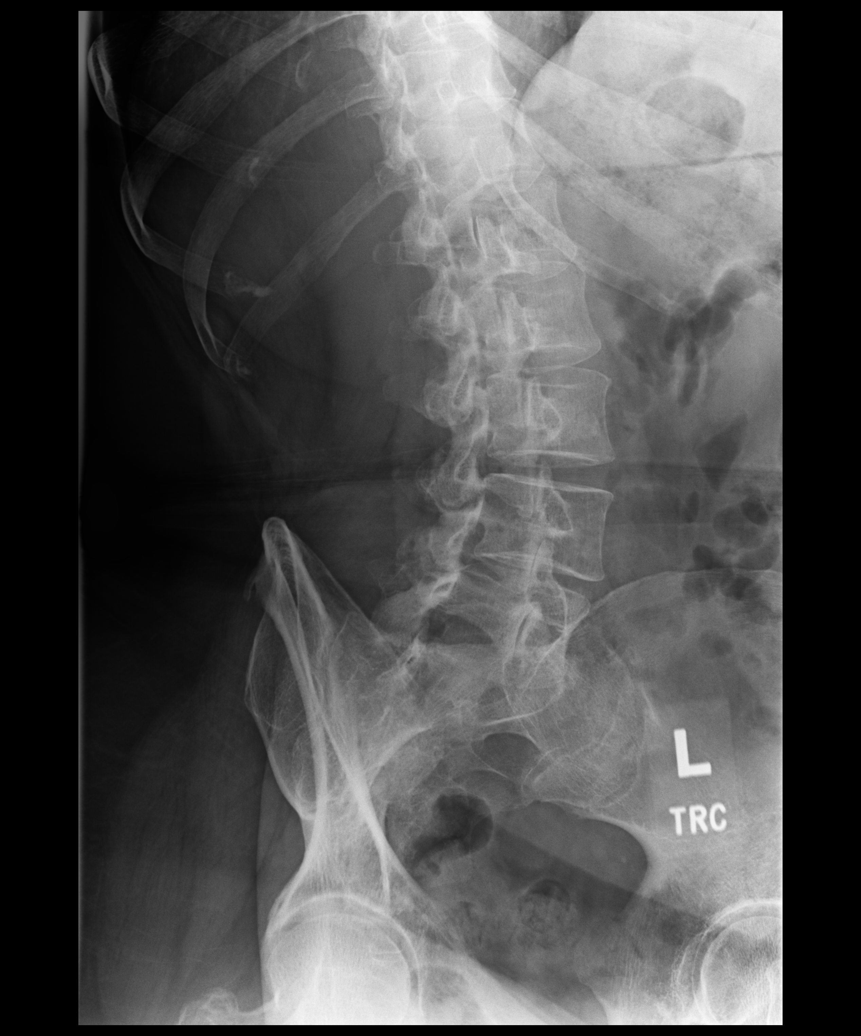

[dg lumbar spine complete 4 +v (4 of 5)]
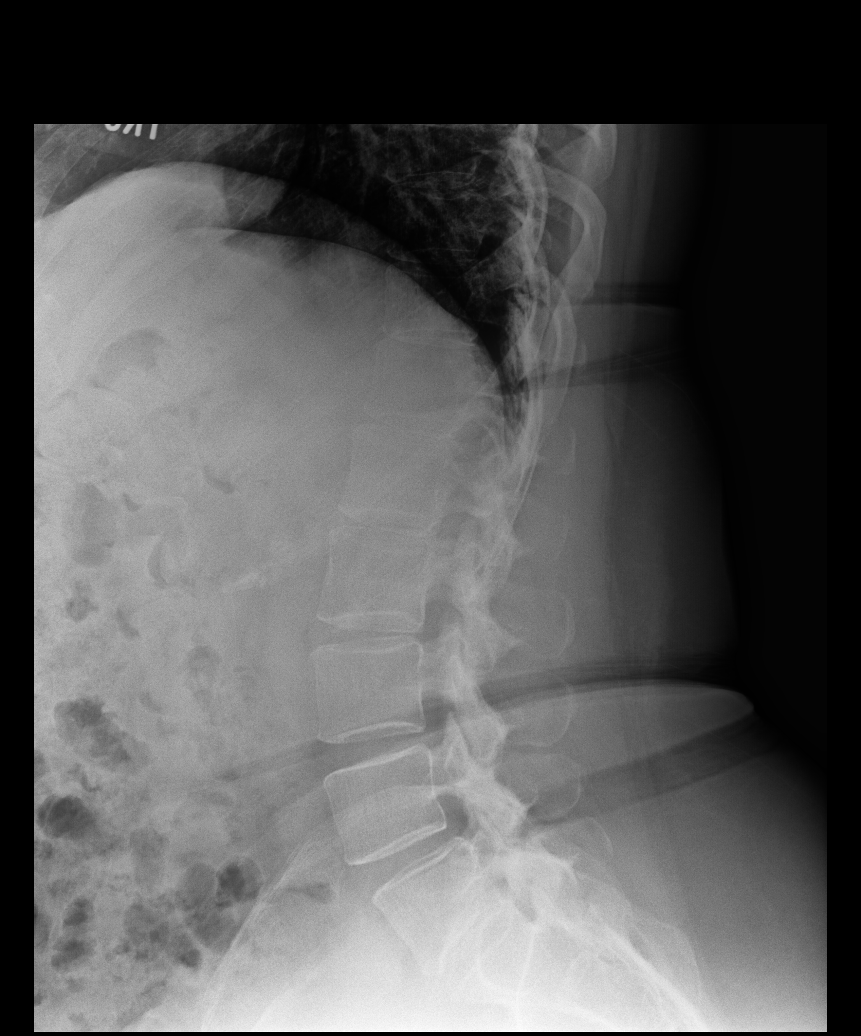

[dg lumbar spine complete 4 +v (5 of 5)]
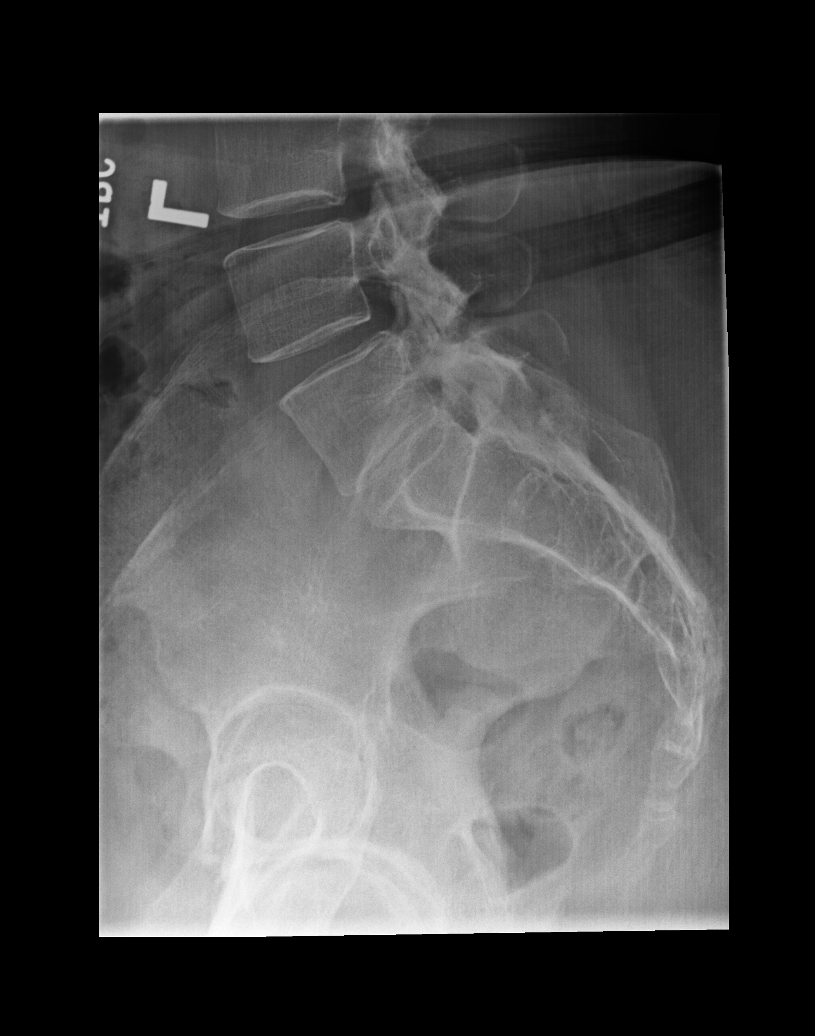

[5 of 5 positions shown; findings below may reference images not displayed]

FINDINGS: There is no evidence of lumbar spine fracture. Alignment is normal.
Intervertebral disc spaces are maintained.
IMPRESSION: Negative.

## 2023-12-17 NOTE — Progress Notes (Unsigned)
 Virtual Visit via Video Note  I connected with Tracie James on 12/22/23 at  8:30 AM EDT by a video enabled telemedicine application and verified that I am speaking with the correct person using two identifiers.  Location: Patient: car Provider: office Persons participated in the visit- patient, provider    I discussed the limitations of evaluation and management by telemedicine and the availability of in person appointments. The patient expressed understanding and agreed to proceed.    I discussed the assessment and treatment plan with the patient. The patient was provided an opportunity to ask questions and all were answered. The patient agreed with the plan and demonstrated an understanding of the instructions.   The patient was advised to call back or seek an in-person evaluation if the symptoms worsen or if the condition fails to improve as anticipated.    Todd Fossa, MD    [facility] MD/PA/NP OP Progress Note  12/22/2023 9:19 AM Tracie James  MRN:  [id number]  Chief Complaint:  Chief Complaint  Patient presents with   Follow-up   HPI:  This is a follow-up appointment for depression, anxiety and insomnia.  She states that she was able to attend the graduation.  Odie Benne was excited to see her.  She was invited to the house, and she also invited Odie Benne to a dinner.  She shares an episode of her being asked to be back immediately through the text message from the step mother of her daughter. She was called horrible, whore, not a good mother by this step mother, and she kept going. Toriana felt it was all of a sudden, and could not understand why she started to act like this.  She was crying and upset.  She was crying over the way back home.  Her ex-husband apologized, telling her that it is none of her (step mother) business. Lucy not has email address of Odie Benne, and they have been communicated through this.  She misses her. She also reports feeling insecure at the job.  She is aware that  productivity has slowed down due to difficulty in concentration.  However, she thinks BuSpar  has been helping.  She also thinks Rodolfo Clan has been better over time.  She has fair sleep.  Although she reports occasional passive SI, she denies any intent or plan.  She agrees to contact emergency resources if any worsening.  She denies alcohol use or drug use.  She has not used lorazepam  for the past several days.   Visit Diagnosis:    ICD-10-CM   1. MDD (major depressive disorder), recurrent episode, mild (HCC)  F33.0     2. Anxiety  F41.9 busPIRone  (BUSPAR ) 10 MG tablet    3. Insomnia, unspecified type  G47.00     4. MDD (major depressive disorder), recurrent, in partial remission (HCC)  F33.41 busPIRone  (BUSPAR ) 10 MG tablet      Past Psychiatric History: Please see initial evaluation for full details. I have reviewed the history. No updates at this time.     Past Medical History:  Past Medical History:  Diagnosis Date   Anxiety    Asthma    Bipolar 2 disorder (HCC)    Depression    GERD (gastroesophageal reflux disease)    Palpitations    PVC (premature ventricular contraction)     Past Surgical History:  Procedure Laterality Date   APPENDECTOMY     TOTAL ABDOMINAL HYSTERECTOMY      Family Psychiatric History: Please see initial evaluation for full details. I  have reviewed the history. No updates at this time.     Family History:  Family History  Problem Relation Age of Onset   Depression Mother    Multiple sclerosis Mother    Schizophrenia Maternal Grandmother    Heart disease Maternal Grandmother        aortic valve replacement    Social History:  Social History   Socioeconomic History   Marital status: Divorced    Spouse name: Not on file   Number of children: Not on file   Years of education: Not on file   Highest education level: Not on file  Occupational History   Not on file  Tobacco Use   Smoking status: Former    Current packs/day: 0.00    Types:  Cigarettes    Quit date: 03/31/2014    Years since quitting: 9.7   Smokeless tobacco: Never  Vaping Use   Vaping status: Every Day  Substance and Sexual Activity   Alcohol use: No   Drug use: No   Sexual activity: Yes    Partners: Male    Birth control/protection: Surgical  Other Topics Concern   Not on file  Social History Narrative   Not on file   Social Drivers of Health   Financial Resource Strain: Not on file  Food Insecurity: Not on file  Transportation Needs: Not on file  Physical Activity: Not on file  Stress: Not on file  Social Connections: Not on file    Allergies:  Allergies  Allergen Reactions   Erythromycin Hives   Tetracyclines & Related Hives    Metabolic Disorder Labs: Lab Results  Component Value Date   HGBA1C 5.6 04/25/2023   No results found for: "PROLACTIN" Lab Results  Component Value Date   CHOL 195 04/25/2023   TRIG 109 04/25/2023   HDL 66 04/25/2023   LDLCALC 110 (H) 04/25/2023   Lab Results  Component Value Date   TSH 1.110 11/04/2023   TSH 1.980 04/25/2023    Therapeutic Level Labs: No results found for: "LITHIUM" No results found for: "VALPROATE" No results found for: "CBMZ"  Current Medications: Current Outpatient Medications  Medication Sig Dispense Refill   amoxicillin (AMOXIL) 500 MG capsule Take 500 mg by mouth 3 (three) times daily.     b complex vitamins capsule Take 1 capsule by mouth daily.     buPROPion  (WELLBUTRIN  XL) 150 MG 24 hr tablet Take 1 tablet (150 mg total) by mouth daily. Take total of 450 mg daily. Along with 300 mg tab 90 tablet 1   buPROPion  (WELLBUTRIN  XL) 300 MG 24 hr tablet Take 1 tablet (300 mg total) by mouth daily. TAKE 1 TABLET DAILY AND 150 MG DAILY (TOTAL OF 450 MG) 90 tablet 1   [START ON 01/25/2024] busPIRone  (BUSPAR ) 10 MG tablet Take 1 tablet (10 mg total) by mouth 3 (three) times daily. 270 tablet 0   Coenzyme Q10 (COQ-10 PO) Take 1 capsule by mouth daily.     cyclobenzaprine  (FLEXERIL )  10 MG tablet TAKE 1 TABLET BY MOUTH AT BEDTIME AS NEEDED FOR MUSCLE SPASMS. 30 tablet 1   ferrous sulfate (SLOW IRON ) 160 (50 Fe) MG TBCR SR tablet Take 1 tablet (160 mg total) by mouth daily. 90 tablet 1   FLUoxetine  (PROZAC ) 40 MG capsule Take 2 capsules (80 mg total) by mouth daily. 180 capsule 1   gabapentin  (NEURONTIN ) 400 MG tablet Take 1 tablet (400 mg total) by mouth at bedtime. 30 tablet 11  lansoprazole (PREVACID) 15 MG capsule Take 15 mg by mouth daily at 12 noon.     LORazepam  (ATIVAN ) 1 MG tablet Take 1 tablet (1 mg total) by mouth daily as needed for anxiety. 30 tablet 0   metoprolol  succinate (TOPROL -XL) 50 MG 24 hr tablet Take 1 tablet (50 mg total) by mouth daily. Take with or immediately following a meal.Take with or immediately following a meal 90 tablet 3   naproxen  (NAPROSYN ) 500 MG tablet TAKE 1 TABLET BY MOUTH TWICE A DAY WITH FOOD 180 tablet 3   ondansetron  (ZOFRAN ) 4 MG tablet Take 1 tablet (4 mg total) by mouth every 8 (eight) hours as needed for nausea or vomiting. 30 tablet 4   traZODone  (DESYREL ) 50 MG tablet Take 0.5-1 tablets (25-50 mg total) by mouth at bedtime. 90 tablet 0   UNABLE TO FIND Take 1 capsule by mouth daily. Cinnamon/Tumeric/Geinseng/Apple Cider Vinegar     Vitamin D , Cholecalciferol, 1000 UNITS TABS Take 5,000 Units by mouth daily.      No current facility-administered medications for this visit.     Musculoskeletal: Strength & Muscle Tone: N/A Gait & Station: N/A Patient leans: N/A  Psychiatric Specialty Exam: Review of Systems  Psychiatric/Behavioral:  Positive for decreased concentration, dysphoric mood, sleep disturbance and suicidal ideas. Negative for agitation, behavioral problems, confusion, hallucinations and self-injury. The patient is nervous/anxious. The patient is not hyperactive.   All other systems reviewed and are negative.   There were no vitals taken for this visit.There is no height or weight on file to calculate BMI.   General Appearance: Well Groomed  Eye Contact:  Good  Speech:  Clear and Coherent  Volume:  Normal  Mood:  fine  Affect:  Appropriate, Congruent, and calm  Thought Process:  Coherent  Orientation:  Full (Time, Place, and Person)  Thought Content: Logical   Suicidal Thoughts:  Yes.  without intent/plan  Homicidal Thoughts:  No  Memory:  Immediate;   Good  Judgement:  Good  Insight:  Good  Psychomotor Activity:  Normal  Concentration:  Concentration: Good and Attention Span: Good  Recall:  Good  Fund of Knowledge: Good  Language: Good  Akathisia:  No  Handed:  Right  AIMS (if indicated): not done  Assets:  Communication Skills Desire for Improvement  ADL's:  Intact  Cognition: WNL  Sleep:  Fair   Screenings: GAD-7    Flowsheet Row Office Visit from 11/04/2023 in [facility] Office Visit from 08/12/2023 in [facility] Office Visit from 06/23/2023 in [facility] Office Visit from 04/25/2023 in [facility] Office Visit from 01/05/2022 in [facility]  Total GAD-7 Score 21 21 21 18 21       PHQ2-9    Flowsheet Row Office Visit from 11/04/2023 in [facility] Office Visit from 08/12/2023 in [facility] Visit from 06/23/2023 in [facility] Office Visit from 04/25/2023 in [facility] Office Visit from 01/05/2022 in [facility]  PHQ-2 Total Score 2 3 3 3 2   PHQ-9 Total Score 18 11 13 11 11       Flowsheet Row Office Visit from 08/31/2021 in [facility] ED from 07/09/2021 in [facility] Video Visit from 11/07/2020 in [facility]  C-SSRS RISK CATEGORY Low Risk  No Risk Error: Q3, 4, or 5 should not be populated when Q2 is  No        Assessment and Plan:  Gladiola Stopher is a 40? y.o. year old female with a history of depression, history of PVC, r/o seizure (not confirmed on 72 hour EEG), who presents for follow up appointment for below.   1. MDD (major depressive disorder), recurrent episode, mild (HCC) 2. Anxiety Acute stressors include: upcoming graduation of her daughter  Other stressors include:  losing custody of her 79 year old daughter, conflict with her ex husband  History:   She experienced significant anxiety, crying spells in the context of a recent incident with her stepmother of her daughter.  Although she reports intermittent passive SI, and reports stress at work, things has been improving over the past week.  Will continue current medication regimen at this time while monitoring closely.  Will continue fluoxetine  to target depression and anxiety.  Will continue bupropion  as adjunctive treatment for depression while monitoring any interaction with fluoxetine .  Will continue BuSpar  for anxiety.  Will continue clonazepam as needed for anxiety.   3. Insomnia, unspecified type Overall stable.  Will continue trazodone  as needed for insomnia.     # High risk medication use  She rarely fills lorazepam , and 1 prescription lasts for 1 year.  Will consider screening if it becomes more frequent use.     Plan Continue fluoxetine  80 mg daily Continue Bupropion  450 mg daily Continue Buspar  10 mg three times a day  Continue lorazepam  1 mg as needed for anxiety - she rarely takes this medication Continue Trazodone  25-50 mg at night as needed for insomnia Next appointment - 7/11 at 9 am, video - sleep study was not conclusive for sleep apnea in July 2023    Past trials of medication: Cymbalta, Zoloft, Prozac , bupropion , Lithium (sick), lamotrigine  (weight gain), Depakote (spending money), Latuda , Abilify (did not work), quetiapine (weight gain, somnolence). Geodon  (PVC occurred)   The patient demonstrates  the following risk factors for suicide: Chronic risk factors for suicide include: psychiatric disorder of depression and previous suicide attempts of overdosing medication. Acute risk factors for suicide include: family or marital conflict. Protective factors for this patient include: responsibility to others (children, family), coping skills and hope for the future. Patient is future oriented and agrees to come for next appointment. Considering these factors, the overall suicide risk at this point appears to be low. She is future oriented, and is amenable to treatment. Patient is appropriate for outpatient follow up.  Collaboration of Care: Collaboration of Care: Other reviewed notes in Epic  Patient/Guardian was advised Release of Information must be obtained prior to any record release in order to collaborate their care with an outside provider. Patient/Guardian was advised if they have not already done so to contact the registration department to sign all necessary forms in order for us  to release information regarding their care.   Consent: Patient/Guardian gives verbal consent for treatment and assignment of benefits for services provided during this visit. Patient/Guardian expressed understanding and agreed to proceed.    Todd Fossa, MD 12/22/2023, 9:19 AM

## 2023-12-22 ENCOUNTER — Encounter: Payer: Self-pay | Admitting: Psychiatry

## 2023-12-22 ENCOUNTER — Telehealth (INDEPENDENT_AMBULATORY_CARE_PROVIDER_SITE_OTHER): Admitting: Psychiatry

## 2023-12-22 DIAGNOSIS — F419 Anxiety disorder, unspecified: Secondary | ICD-10-CM | POA: Diagnosis not present

## 2023-12-22 DIAGNOSIS — F330 Major depressive disorder, recurrent, mild: Secondary | ICD-10-CM

## 2023-12-22 DIAGNOSIS — G4700 Insomnia, unspecified: Secondary | ICD-10-CM

## 2023-12-22 DIAGNOSIS — F3341 Major depressive disorder, recurrent, in partial remission: Secondary | ICD-10-CM

## 2023-12-22 MED ORDER — BUSPIRONE HCL 10 MG PO TABS: 10.0000 mg | ORAL_TABLET | Freq: Three times a day (TID) | ORAL | 0 refills | Status: DC

## 2023-12-22 NOTE — Patient Instructions (Signed)
  Continue fluoxetine  80 mg daily Continue Bupropion  450 mg daily Continue Buspar  10 mg three times a day  Continue lorazepam  1 mg as needed for anxiety  Continue Trazodone  25-50 mg at night as needed for insomnia Next appointment - 7/11 at 9 am

## 2024-01-09 ENCOUNTER — Other Ambulatory Visit: Payer: Self-pay

## 2024-01-09 ENCOUNTER — Emergency Department

## 2024-01-09 ENCOUNTER — Inpatient Hospital Stay (HOSPITAL_COMMUNITY)
Admission: AD | Admit: 2024-01-09 | Discharge: 2024-01-14 | DRG: 880 | Disposition: A | Discharge status: Home / Self Care | Source: Intra-hospital | Attending: Psychiatry | Admitting: Psychiatry

## 2024-01-09 ENCOUNTER — Emergency Department
Admission: EM | Admit: 2024-01-09 | Discharge: 2024-01-09 | Disposition: A | Discharge status: Other Acute Inpatient Hospital | Attending: Emergency Medicine | Admitting: Emergency Medicine

## 2024-01-09 ENCOUNTER — Encounter (HOSPITAL_COMMUNITY): Payer: Self-pay | Admitting: Psychiatry

## 2024-01-09 DIAGNOSIS — F32A Depression, unspecified: Secondary | ICD-10-CM

## 2024-01-09 DIAGNOSIS — Z566 Other physical and mental strain related to work: Secondary | ICD-10-CM

## 2024-01-09 DIAGNOSIS — R748 Abnormal levels of other serum enzymes: Secondary | ICD-10-CM | POA: Diagnosis present

## 2024-01-09 DIAGNOSIS — F17290 Nicotine dependence, other tobacco product, uncomplicated: Secondary | ICD-10-CM | POA: Diagnosis present

## 2024-01-09 DIAGNOSIS — J45909 Unspecified asthma, uncomplicated: Secondary | ICD-10-CM | POA: Diagnosis present

## 2024-01-09 DIAGNOSIS — F329 Major depressive disorder, single episode, unspecified: Secondary | ICD-10-CM | POA: Diagnosis not present

## 2024-01-09 DIAGNOSIS — R000 Tachycardia, unspecified: Secondary | ICD-10-CM | POA: Diagnosis not present

## 2024-01-09 DIAGNOSIS — F419 Anxiety disorder, unspecified: Secondary | ICD-10-CM | POA: Insufficient documentation

## 2024-01-09 DIAGNOSIS — Z3202 Encounter for pregnancy test, result negative: Secondary | ICD-10-CM | POA: Diagnosis present

## 2024-01-09 DIAGNOSIS — K219 Gastro-esophageal reflux disease without esophagitis: Secondary | ICD-10-CM | POA: Diagnosis present

## 2024-01-09 DIAGNOSIS — F411 Generalized anxiety disorder: Principal | ICD-10-CM | POA: Diagnosis present

## 2024-01-09 DIAGNOSIS — G40909 Epilepsy, unspecified, not intractable, without status epilepticus: Secondary | ICD-10-CM | POA: Diagnosis present

## 2024-01-09 DIAGNOSIS — F3181 Bipolar II disorder: Secondary | ICD-10-CM | POA: Diagnosis present

## 2024-01-09 DIAGNOSIS — Z818 Family history of other mental and behavioral disorders: Secondary | ICD-10-CM

## 2024-01-09 DIAGNOSIS — R7401 Elevation of levels of liver transaminase levels: Secondary | ICD-10-CM | POA: Insufficient documentation

## 2024-01-09 DIAGNOSIS — R112 Nausea with vomiting, unspecified: Secondary | ICD-10-CM | POA: Diagnosis not present

## 2024-01-09 DIAGNOSIS — Z8249 Family history of ischemic heart disease and other diseases of the circulatory system: Secondary | ICD-10-CM

## 2024-01-09 DIAGNOSIS — E669 Obesity, unspecified: Secondary | ICD-10-CM | POA: Diagnosis present

## 2024-01-09 DIAGNOSIS — Z9151 Personal history of suicidal behavior: Secondary | ICD-10-CM | POA: Diagnosis not present

## 2024-01-09 DIAGNOSIS — I10 Essential (primary) hypertension: Secondary | ICD-10-CM | POA: Diagnosis present

## 2024-01-09 DIAGNOSIS — R45851 Suicidal ideations: Secondary | ICD-10-CM | POA: Diagnosis present

## 2024-01-09 DIAGNOSIS — R440 Auditory hallucinations: Secondary | ICD-10-CM | POA: Diagnosis present

## 2024-01-09 DIAGNOSIS — R1012 Left upper quadrant pain: Secondary | ICD-10-CM | POA: Diagnosis not present

## 2024-01-09 DIAGNOSIS — Z820 Family history of epilepsy and other diseases of the nervous system: Secondary | ICD-10-CM

## 2024-01-09 DIAGNOSIS — I493 Ventricular premature depolarization: Secondary | ICD-10-CM | POA: Diagnosis present

## 2024-01-09 DIAGNOSIS — Z79899 Other long term (current) drug therapy: Secondary | ICD-10-CM | POA: Insufficient documentation

## 2024-01-09 DIAGNOSIS — G4700 Insomnia, unspecified: Secondary | ICD-10-CM | POA: Diagnosis present

## 2024-01-09 DIAGNOSIS — Z6834 Body mass index (BMI) 34.0-34.9, adult: Secondary | ICD-10-CM | POA: Diagnosis not present

## 2024-01-09 DIAGNOSIS — Z881 Allergy status to other antibiotic agents status: Secondary | ICD-10-CM

## 2024-01-09 DIAGNOSIS — Z9049 Acquired absence of other specified parts of digestive tract: Secondary | ICD-10-CM

## 2024-01-09 DIAGNOSIS — Z90710 Acquired absence of both cervix and uterus: Secondary | ICD-10-CM

## 2024-01-09 DIAGNOSIS — E559 Vitamin D deficiency, unspecified: Secondary | ICD-10-CM | POA: Diagnosis present

## 2024-01-09 DIAGNOSIS — D72829 Elevated white blood cell count, unspecified: Secondary | ICD-10-CM | POA: Diagnosis not present

## 2024-01-09 DIAGNOSIS — M797 Fibromyalgia: Secondary | ICD-10-CM | POA: Diagnosis present

## 2024-01-09 DIAGNOSIS — G894 Chronic pain syndrome: Secondary | ICD-10-CM | POA: Diagnosis present

## 2024-01-09 LAB — COMPREHENSIVE METABOLIC PANEL WITH GFR
ALT: 152 U/L — ABNORMAL HIGH (ref 0–44)
AST: 111 U/L — ABNORMAL HIGH (ref 15–41)
Albumin: 4.3 g/dL (ref 3.5–5.0)
Alkaline Phosphatase: 85 U/L (ref 38–126)
Anion gap: 16 — ABNORMAL HIGH (ref 5–15)
BUN: 14 mg/dL (ref 6–20)
CO2: 19 mmol/L — ABNORMAL LOW (ref 22–32)
Calcium: 9.8 mg/dL (ref 8.9–10.3)
Chloride: 101 mmol/L (ref 98–111)
Creatinine, Ser: 1.08 mg/dL — ABNORMAL HIGH (ref 0.44–1.00)
GFR, Estimated: >60 mL/min (ref >60)
Glucose, Bld: 117 mg/dL — ABNORMAL HIGH (ref 70–99)
Potassium: 3.8 mmol/L (ref 3.5–5.1)
Sodium: 136 mmol/L (ref 135–145)
Total Bilirubin: 1.2 mg/dL (ref 0.0–1.2)
Total Protein: 7.8 g/dL (ref 6.5–8.1)

## 2024-01-09 LAB — URINE DRUG SCREEN, QUALITATIVE (ARMC ONLY)
Amphetamines, Ur Screen: NOT DETECTED
Barbiturates, Ur Screen: NOT DETECTED
Benzodiazepine, Ur Scrn: NOT DETECTED
Cannabinoid 50 Ng, Ur ~~LOC~~: POSITIVE — AB
Cocaine Metabolite,Ur ~~LOC~~: NOT DETECTED
MDMA (Ecstasy)Ur Screen: NOT DETECTED
Methadone Scn, Ur: NOT DETECTED
Opiate, Ur Screen: NOT DETECTED
Phencyclidine (PCP) Ur S: NOT DETECTED
Tricyclic, Ur Screen: NOT DETECTED

## 2024-01-09 LAB — CBC
HCT: 44.5 % (ref 36.0–46.0)
Hemoglobin: 14.2 g/dL (ref 12.0–15.0)
MCH: 27.1 pg (ref 26.0–34.0)
MCHC: 31.9 g/dL (ref 30.0–36.0)
MCV: 84.9 fL (ref 80.0–100.0)
Platelets: 348 10*3/uL (ref 150–400)
RBC: 5.24 MIL/uL — ABNORMAL HIGH (ref 3.87–5.11)
RDW: 15.2 % (ref 11.5–15.5)
WBC: 11.4 10*3/uL — ABNORMAL HIGH (ref 4.0–10.5)
nRBC: 0 % (ref 0.0–0.2)

## 2024-01-09 LAB — URINALYSIS, ROUTINE W REFLEX MICROSCOPIC
Bilirubin Urine: NEGATIVE
Glucose, UA: NEGATIVE mg/dL
Hgb urine dipstick: NEGATIVE
Ketones, ur: 20 mg/dL — AB
Leukocytes,Ua: NEGATIVE
Nitrite: NEGATIVE
Protein, ur: NEGATIVE mg/dL
Specific Gravity, Urine: 1.016 (ref 1.005–1.030)
pH: 5 (ref 5.0–8.0)

## 2024-01-09 LAB — LIPASE, BLOOD: Lipase: 39 U/L (ref 11–51)

## 2024-01-09 LAB — HEPATITIS PANEL, ACUTE
HCV Ab: NONREACTIVE
Hep A IgM: NONREACTIVE
Hep B C IgM: NONREACTIVE
Hepatitis B Surface Ag: NONREACTIVE

## 2024-01-09 LAB — ETHANOL: Alcohol, Ethyl (B): <15 mg/dL (ref <15)

## 2024-01-09 LAB — PREGNANCY, URINE: Preg Test, Ur: NEGATIVE

## 2024-01-09 MED ORDER — BUSPIRONE HCL 5 MG PO TABS
10.0000 mg | ORAL_TABLET | Freq: Three times a day (TID) | ORAL | Status: DC
  Administered 2024-01-09: 10 mg via ORAL
  Filled 2024-01-09: qty 2

## 2024-01-09 MED ORDER — BUPROPION HCL ER (XL) 150 MG PO TB24: 450.0000 mg | ORAL_TABLET | Freq: Every day | ORAL | Status: DC

## 2024-01-09 MED ORDER — PANTOPRAZOLE SODIUM 40 MG PO TBEC: 40.0000 mg | DELAYED_RELEASE_TABLET | Freq: Every day | ORAL | 0 refills | Status: DC

## 2024-01-09 MED ORDER — SODIUM CHLORIDE 0.9 % IV BOLUS
1000.0000 mL | Freq: Once | INTRAVENOUS | Status: AC
  Administered 2024-01-09: 1000 mL via INTRAVENOUS

## 2024-01-09 MED ORDER — OLANZAPINE 10 MG IM SOLR: 10.0000 mg | Freq: Three times a day (TID) | INTRAMUSCULAR | Status: DC | PRN

## 2024-01-09 MED ORDER — IOHEXOL 300 MG/ML  SOLN
100.0000 mL | Freq: Once | INTRAMUSCULAR | Status: AC | PRN
  Administered 2024-01-09: 100 mL via INTRAVENOUS

## 2024-01-09 MED ORDER — OLANZAPINE 5 MG PO TBDP
5.0000 mg | ORAL_TABLET | Freq: Three times a day (TID) | ORAL | Status: DC | PRN
  Administered 2024-01-09: 5 mg via ORAL
  Filled 2024-01-09: qty 1

## 2024-01-09 MED ORDER — TRAZODONE HCL 50 MG PO TABS: 25.0000 mg | ORAL_TABLET | Freq: Every evening | ORAL | Status: DC | PRN

## 2024-01-09 MED ORDER — OLANZAPINE 10 MG IM SOLR: 5.0000 mg | Freq: Three times a day (TID) | INTRAMUSCULAR | Status: DC | PRN

## 2024-01-09 MED ORDER — LORAZEPAM 1 MG PO TABS: 1.0000 mg | ORAL_TABLET | Freq: Two times a day (BID) | ORAL | Status: DC | PRN

## 2024-01-09 MED ORDER — ACETAMINOPHEN 325 MG PO TABS
650.0000 mg | ORAL_TABLET | Freq: Four times a day (QID) | ORAL | Status: DC | PRN
  Administered 2024-01-13: 650 mg via ORAL
  Filled 2024-01-09: qty 2

## 2024-01-09 MED ORDER — ALUM & MAG HYDROXIDE-SIMETH 200-200-20 MG/5ML PO SUSP: 30.0000 mL | ORAL | Status: DC | PRN

## 2024-01-09 MED ORDER — FLUOXETINE HCL 20 MG PO CAPS: 80.0000 mg | ORAL_CAPSULE | Freq: Every day | ORAL | Status: DC

## 2024-01-09 MED ORDER — ONDANSETRON HCL 4 MG/2ML IJ SOLN: 4.0000 mg | Freq: Once | INTRAMUSCULAR | Status: DC

## 2024-01-09 MED ORDER — MAGNESIUM HYDROXIDE 400 MG/5ML PO SUSP: 30.0000 mL | Freq: Every day | ORAL | Status: DC | PRN

## 2024-01-09 NOTE — ED Notes (Signed)
 EMTALA: REQUIRED DOCUMENTATION COMPLETED AND REVIEWED BY WRITER PRIOR TO PT TRANSFER MD REASSESSMENT EMTALA RN SECTION TRANSFER E-SIGN VS WITHIN REQUIRED TIME   Pt walked out by staff to safe transport and packet handed over to safe transport driver. No belongings due to pts spouse took all home other than eye glasses pt currently has on that are RX.

## 2024-01-09 NOTE — ED Notes (Signed)
 MD at the bedside for pt evaluation

## 2024-01-09 NOTE — ED Notes (Signed)
 Pt taken to interview room for psych consult on cart

## 2024-01-09 NOTE — ED Notes (Signed)
Pt belongings given to husband 

## 2024-01-09 NOTE — ED Provider Notes (Signed)
 [facility] Provider Note    Event Date/Time   First MD Initiated Contact with Patient 01/09/24 1148     (approximate)   History   Chief Complaint Mental Health Problem and Abdominal Pain   HPI  Tracie James is a 53 y.o. female with past medical history of depression, anxiety, seizures, fibromyalgia, and chronic pain syndrome who presents to the ED complaining of depression and abdominal pain.  Patient reports that she has been feeling increasingly depressed with thoughts of suicide over the past week.  She denies any specific plan, states I would never do it.  She states this is partially due to discomfort that she has been having in the left upper quadrant of her abdomen.  She describes occasional nausea and vomiting, denies any changes in her bowel movements and has not had any fevers, flank pain, or dysuria.  She denies any history of abdominal surgeries, denies alcohol consumption.     Physical Exam   Triage Vital Signs: ED Triage Vitals  Encounter Vitals Group     BP 01/09/24 1042 (!) 150/107     Girls Systolic BP Percentile --      Girls Diastolic BP Percentile --      Boys Systolic BP Percentile --      Boys Diastolic BP Percentile --      Pulse Rate 01/09/24 1042 (!) 131     Resp 01/09/24 1042 20     Temp 01/09/24 1042 98.6 F (37 C)     Temp Source 01/09/24 1042 Oral     SpO2 01/09/24 1042 98 %     Weight 01/09/24 1042 200 lb (90.7 kg)     Height 01/09/24 1042 5' 4 (1.626 m)     Head Circumference --      Peak Flow --      Pain Score 01/09/24 1038 0     Pain Loc --      Pain Education --      Exclude from Growth Chart --     Most recent vital signs: Vitals:   01/09/24 1042  BP: (!) 150/107  Pulse: (!) 131  Resp: 20  Temp: 98.6 F (37 C)  SpO2: 98%    Constitutional: Alert and oriented. Eyes: Conjunctivae are normal. Head: Atraumatic. Nose: No congestion/rhinnorhea. Mouth/Throat: Mucous membranes are moist.   Cardiovascular: Normal rate, regular rhythm. Grossly normal heart sounds.  2+ radial pulses bilaterally. Respiratory: Normal respiratory effort.  No retractions. Lungs CTAB. Gastrointestinal: Soft and tender to palpation in the left upper quadrant. No distention. Musculoskeletal: No lower extremity tenderness nor edema.  Neurologic:  Normal speech and language. No gross focal neurologic deficits are appreciated.    ED Results / Procedures / Treatments   Labs (all labs ordered are listed, but only abnormal results are displayed) Labs Reviewed  COMPREHENSIVE METABOLIC PANEL WITH GFR - Abnormal; Notable for the following components:      Result Value   CO2 19 (*)    Glucose, Bld 117 (*)    Creatinine, Ser 1.08 (*)    AST 111 (*)    ALT 152 (*)    Anion gap 16 (*)    All other components within normal limits  CBC - Abnormal; Notable for the following components:   WBC 11.4 (*)    RBC 5.24 (*)    All other components within normal limits  URINALYSIS, ROUTINE W REFLEX MICROSCOPIC - Abnormal; Notable for the following components:   Color, Urine YELLOW (*)  APPearance HAZY (*)    Ketones, ur 20 (*)    All other components within normal limits  ETHANOL  LIPASE, BLOOD  URINE DRUG SCREEN, QUALITATIVE ([facility] ONLY)  PREGNANCY, URINE  HEPATITIS PANEL, ACUTE  POC URINE PREG, ED     EKG  ED ECG REPORT I, Carlin Palin, the attending physician, personally viewed and interpreted this ECG.   Date: 01/09/2024  EKG Time: 10:54  Rate: 125  Rhythm: sinus tachycardia  Axis: Normal  Intervals:none  ST&T Change: None  RADIOLOGY CT abdomen/pelvis reviewed and interpreted by me with no inflammatory changes, focal fluid collections, or dilated bowel loops.  PROCEDURES:  Critical Care performed: No  Procedures   MEDICATIONS ORDERED IN ED: Medications  ondansetron  (ZOFRAN ) injection 4 mg (0 mg Intravenous Hold 01/09/24 1350)  sodium chloride 0.9 % bolus 1,000 mL (1,000 mLs  Intravenous New Bag/Given 01/09/24 1344)  iohexol (OMNIPAQUE) 300 MG/ML solution 100 mL (100 mLs Intravenous Contrast Given 01/09/24 1421)     IMPRESSION / MDM / ASSESSMENT AND PLAN / ED COURSE  I reviewed the triage vital signs and the nursing notes.                              53 y.o. female with past medical history of depression, anxiety, seizures, fibromyalgia, and chronic pain syndrome who presents to the ED complaining of increasing left upper quadrant abdominal pain with nausea and vomiting intermittently over the past couple of days, also reports increasing depression with suicidal ideation.  Patient's presentation is most consistent with acute presentation with potential threat to life or bodily function.  Differential diagnosis includes, but is not limited to, gastritis, pancreatitis, hepatitis, cholecystitis, biliary colic, colitis, appendicitis, depression, anxiety, psychosis.  Patient nontoxic-appearing and in no acute distress, vital signs remarkable for tachycardia in triage, but improved at the time of my assessment.  Labs with mild leukocytosis but no significant anemia, electrolyte abnormality, or AKI.  She does have a transaminitis, similar to 2 months ago but slightly progressed.  She does not have any right upper quadrant tenderness, CT imaging shows hiatal hernia but is otherwise unremarkable.  She does have a pulmonary nodule and referral provided for outpatient follow-up.  Symptoms seem more likely related to gastritis and hiatal hernia rather than hepatitis and she would be appropriate for outpatient follow-up with GI for this issue.  Patient may be medically cleared for psychiatric evaluation.  She is calm and cooperative and we will maintain voluntary status.  The patient has been placed in psychiatric observation due to the need to provide a safe environment for the patient while obtaining psychiatric consultation and evaluation, as well as ongoing medical and medication  management to treat the patient's condition.  The patient has not been placed under full IVC at this time.       FINAL CLINICAL IMPRESSION(S) / ED DIAGNOSES   Final diagnoses:  Left upper quadrant abdominal pain  Suicidal ideation  Transaminitis     Rx / DC Orders   ED Discharge Orders          Ordered    pantoprazole (PROTONIX) 40 MG tablet  Daily        01/09/24 1550    AMB  Referral to [facility]        01/09/24 1555             Note:  This document was prepared using Dragon voice recognition software  and may include unintentional dictation errors.   Willo Dunnings, MD 01/09/24 [phone]

## 2024-01-09 NOTE — ED Notes (Signed)
 Pt given meal tray and set up for pt by [place] EDT

## 2024-01-09 NOTE — Consult Note (Signed)
 [facility] Consult Note  Patient Name: Tracie James MRN: [id number] DOB: 1971-03-06 DATE OF Consult: 01/09/2024  PRIMARY PSYCHIATRIC DIAGNOSES  1.  Unspecified anxiety disorder 2.  Depression, unspecified    RECOMMENDATIONS  Recommendations: Medication recommendations: Continue Prozac  80mg  po daily for mood; Continue bupropion  XL 450mg  po daily for mood; Continue Buspar  10mg  po TID; continue trazodone  25mg  po nightly PRN insomnia; continue Ativan  1mg  po daily PRN anxiety. Seizure risk and bupropion  discussed and risks, benefits, alternatives, side effects  Non-Medication/therapeutic recommendations: Psychiatric hospitalization  Is inpatient psychiatric hospitalization recommended for this patient? Yes (Explain why): Suicidal ideation with plan, denies suicidal intent, unable to engage in safety planning  Follow-Up Telepsychiatry C/L services: We will sign off for now. Please re-consult our service if needed for any concerning changes in the patient's condition, discharge planning, or questions. Communication: Treatment team members (and family members if applicable) who were involved in treatment/care discussions and planning, and with whom we spoke or engaged with via secure text/chat, include the following: Team via Epic chat    Tracie James is a 53 year old female with a history of depression, bipolar 2 disorder, anxiety, fibromyalgia, history of PVC, r/o seizure (not confirmed on 72 hour EEG), chronic pain who presents to the ED due to abdominal pain and depression, endorsing suicidal thoughts in part due to abdominal pain. Chart reviewed, psychiatry consulted for evaluation and management. On evaluation, patient noted to be tearful, anxious, cooperative, linear, not appearing internally preoccupied, not responding to internal stimuli, alert and oriented x 4. Patient reports significant worsening of depression and anxiety in the past week. Patient reports she is afraid to go to the  work. States she has been so distressed she has not gotten out of bed for 5 days. Endorses racing thoughts. Patient reports her tooth has been hurting her after an extraction, states she has not been able to eat or sleep due to the pain. And states since she has not been eating she has not taken her medication since Thursday.  Patient endorses depressed mood and feelings of hopelessness. Patient endorses active suicidal ideation with thoughts to shoot herself in the head and has had thoughts to overdose on medication. Denies access to firearms. Denies suicidal intent. Reports suicidal thoughts have intensified form baseline suicidal thoughts. When inquire if patient has things for which she wants to live she states, I don't know. Patient unable to engage in safety planning at the time of evaluation. Given patient's psychiatric decompensation, intensified suicidal thoughts with plan and inability to engage in safety planning at time of evaluation, inpatient psychiatric hospitalization is recommended.   Thank you for involving us  in the care of this patient. If you have any additional questions or concerns, please call [phone] and ask for me or the provider on-call.  TELEPSYCHIATRY ATTESTATION & CONSENT  As the provider for this telehealth consult, I attest that I verified the patient's identity using two separate identifiers, introduced myself to the patient, provided my credentials, disclosed my location, and performed this encounter via a HIPAA-compliant, real-time, face-to-face, two-way, interactive audio and video platform and with the full consent and agreement of the patient (or guardian as applicable.)  Patient physical location: ED in [facility]. Telehealth provider physical location: home office in state of [place]    Video start time: 1628 EST Video end time: 1644 EST   IDENTIFYING DATA  Tracie James is a 53 y.o. year-old female for whom a psychiatric consultation has been  ordered by the primary provider.  The patient was identified using two separate identifiers.  CHIEF COMPLAINT/REASON FOR CONSULT  Mental health crisis and abdominal pain    HISTORY OF PRESENT ILLNESS (HPI)  Tracie James is a 52 year old female with a history of depression, bipolar 2 disorder, anxiety, fibromyalgia, history of PVC, r/o seizure (not confirmed on 72 hour EEG), chronic pain who presents to the ED due to abdominal pain and depression, endorsing suicidal thoughts in part due to abdominal pain. Chart reviewed, psychiatry consulted for evaluation and management.   Per chart review of last psychiatry note 12/22/23, patient with chronic passive suicidal ideation at baseline.   On evaluation, patient noted to be tearful, anxious, cooperative, linear, not appearing internally preoccupied, not responding to internal stimuli, alert and oriented x 4. Patient reports significant worsening of depression and anxiety in the past week. Patient reports she is afraid to go to the work. She states, I'm afraid I'm going to do my job wrong. I don't want to go to work and misdiagnose someone. 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 States she has been so distressed she has not gotten out of bed for 5 days. Endorses racing thoughts. Patient reports her tooth has been hurting her after an extraction, states she has not been able to eat or sleep due to the pain. And states since she has not been eating she has not taken her medication since Thursday. Patient repeatedly states, I'm scared! I'm scared! I'm petrified to go to work. Patient reports nothing happened at work, but she started to feel worried about work. Patient endorses depressed mood and feelings of hopelessness. Patient endorses active suicidal ideation with thoughts to shoot herself in the head and has had thoughts to overdose on medication. Denies access to firearms. Denies suicidal intent. Reports suicidal thoughts have intensified form baseline  suicidal thoughts. When inquire if patient has things for which she wants to live she states, I don't know. Patient reports she has 2 children who don't live with her, only speaks to one of them. States she lives with her boyfriend. Patient unable to engage in safety planning at the time of evaluation.   PAST PSYCHIATRIC HISTORY  Prior med trials: Per psych note: Cymbalta, Zoloft, Prozac , bupropion , Lithium (sick), lamotrigine  (weight gain), Depakote (spending money), Latuda , Abilify (did not work), quetiapine (weight gain, somnolence). Geodon  (PVC occurred)  Prior psych hospitalizations: 2010 for suicidal ideation  Prior overdose attempt Has outpatient psychiatrist   Otherwise as per HPI above.  PAST MEDICAL HISTORY  Past Medical History:  Diagnosis Date   Anxiety    Asthma    Bipolar 2 disorder (HCC)    Depression    GERD (gastroesophageal reflux disease)    Palpitations    PVC (premature ventricular contraction)      HOME MEDICATIONS  Facility Ordered Medications  Medication   ondansetron  (ZOFRAN ) injection 4 mg   [COMPLETED] sodium chloride 0.9 % bolus 1,000 mL   [COMPLETED] iohexol (OMNIPAQUE) 300 MG/ML solution 100 mL   PTA Medications  Medication Sig   pantoprazole (PROTONIX) 40 MG tablet Take 1 tablet (40 mg total) by mouth daily for 14 days.   Vitamin D , Cholecalciferol, 1000 UNITS TABS Take 5,000 Units by mouth daily.    cyclobenzaprine  (FLEXERIL ) 10 MG tablet TAKE 1 TABLET BY MOUTH AT BEDTIME AS NEEDED FOR MUSCLE SPASMS.   b complex vitamins capsule Take 1 capsule by mouth daily.   Coenzyme Q10 (COQ-10 PO) Take 1 capsule by mouth daily.  naproxen  (NAPROSYN ) 500 MG tablet TAKE 1 TABLET BY MOUTH TWICE A DAY WITH FOOD   UNABLE TO FIND Take 1 capsule by mouth daily. Cinnamon/Tumeric/Geinseng/Apple Cider Vinegar   ferrous sulfate (SLOW IRON ) 160 (50 Fe) MG TBCR SR tablet Take 1 tablet (160 mg total) by mouth daily.   buPROPion  (WELLBUTRIN  XL) 150 MG 24 hr tablet Take 1  tablet (150 mg total) by mouth daily. Take total of 450 mg daily. Along with 300 mg tab   buPROPion  (WELLBUTRIN  XL) 300 MG 24 hr tablet Take 1 tablet (300 mg total) by mouth daily. TAKE 1 TABLET DAILY AND 150 MG DAILY (TOTAL OF 450 MG)   FLUoxetine  (PROZAC ) 40 MG capsule Take 2 capsules (80 mg total) by mouth daily.   traZODone  (DESYREL ) 50 MG tablet Take 0.5-1 tablets (25-50 mg total) by mouth at bedtime.   amoxicillin (AMOXIL) 500 MG capsule Take 500 mg by mouth 3 (three) times daily.   gabapentin  (NEURONTIN ) 400 MG tablet Take 1 tablet (400 mg total) by mouth at bedtime.   ondansetron  (ZOFRAN ) 4 MG tablet Take 1 tablet (4 mg total) by mouth every 8 (eight) hours as needed for nausea or vomiting.   metoprolol  succinate (TOPROL -XL) 50 MG 24 hr tablet Take 1 tablet (50 mg total) by mouth daily. Take with or immediately following a meal.Take with or immediately following a meal   [START ON 01/25/2024] busPIRone  (BUSPAR ) 10 MG tablet Take 1 tablet (10 mg total) by mouth 3 (three) times daily.     ALLERGIES  Allergies  Allergen Reactions   Erythromycin Hives   Tetracyclines & Related Hives    SOCIAL & SUBSTANCE USE HISTORY  Social History   Socioeconomic History   Marital status: Divorced    Spouse name: Not on file   Number of children: Not on file   Years of education: Not on file   Highest education level: Not on file  Occupational History   Not on file  Tobacco Use   Smoking status: Former    Current packs/day: 0.00    Types: Cigarettes    Quit date: 03/31/2014    Years since quitting: 9.7   Smokeless tobacco: Never  Vaping Use   Vaping status: Every Day  Substance and Sexual Activity   Alcohol use: No   Drug use: No   Sexual activity: Yes    Partners: Male    Birth control/protection: Surgical  Other Topics Concern   Not on file  Social History Narrative   Not on file   Social Drivers of Health   Financial Resource Strain: Not on file  Food Insecurity: Not on file   Transportation Needs: Not on file  Physical Activity: Not on file  Stress: Not on file  Social Connections: Not on file   Social History   Tobacco Use  Smoking Status Former   Current packs/day: 0.00   Types: Cigarettes   Quit date: 03/31/2014   Years since quitting: 9.7  Smokeless Tobacco Never   Social History   Substance and Sexual Activity  Alcohol Use No   Social History   Substance and Sexual Activity  Drug Use No     FAMILY HISTORY  Family History  Problem Relation Age of Onset   Depression Mother    Multiple sclerosis Mother    Schizophrenia Maternal Grandmother    Heart disease Maternal Grandmother        aortic valve replacement    MENTAL STATUS EXAM (MSE)  Mental Status Exam:  General Appearance: Fairly Groomed  Orientation:  Full (Time, Place, and Person)  Memory:  Recent;   Good Remote;   Good  Concentration:  Concentration: Fair  Recall:  Fair  Attention  Fair  Eye Contact:  Minimal  Speech:  Clear and Coherent  Language:  Good  Volume:  Increased  Mood: I'm scared!  Affect:  Tearful  Thought Process:  Linear  Thought Content:  Tangential  Suicidal Thoughts:  Yes. Endorses suicidal ideation with thoughts to overdose or shoot herself, denies access to firearms. Denies suicidal intent  Homicidal Thoughts:  No  Judgement:  Fair  Insight:  Shallow  Psychomotor Activity:  Increased  Akathisia:  Negative  Fund of Knowledge:  Good    Assets:  Communication Skills Social Support  Cognition:  WNL  ADL's:  Intact  AIMS (if indicated):       VITALS  Blood pressure (!) 150/107, pulse (!) 131, temperature 98.6 F (37 C), temperature source Oral, resp. rate 20, height 5' 4 (1.626 m), weight 90.7 kg, SpO2 98%.  LABS  Admission on 01/09/2024  Component Date Value Ref Range Status   Sodium 01/09/2024 136  135 - 145 mmol/L Final   Potassium 01/09/2024 3.8  3.5 - 5.1 mmol/L Final   Chloride 01/09/2024 101  98 - 111 mmol/L Final   CO2 01/09/2024  19 (L)  22 - 32 mmol/L Final   Glucose, Bld 01/09/2024 117 (H)  70 - 99 mg/dL Final   Glucose reference range applies only to samples taken after fasting for at least 8 hours.   BUN 01/09/2024 14  6 - 20 mg/dL Final   Creatinine, Ser 01/09/2024 1.08 (H)  0.44 - 1.00 mg/dL Final   Calcium 93/76/7974 9.8  8.9 - 10.3 mg/dL Final   Total Protein 93/76/7974 7.8  6.5 - 8.1 g/dL Final   Albumin 93/76/7974 4.3  3.5 - 5.0 g/dL Final   AST 93/76/7974 111 (H)  15 - 41 U/L Final   ALT 01/09/2024 152 (H)  0 - 44 U/L Final   Alkaline Phosphatase 01/09/2024 85  38 - 126 U/L Final   Total Bilirubin 01/09/2024 1.2  0.0 - 1.2 mg/dL Final   GFR, Estimated 01/09/2024 >60  >60 mL/min Final   Comment: (NOTE) Calculated using the CKD-EPI Creatinine Equation (2021)    Anion gap 01/09/2024 16 (H)  5 - 15 Final   Performed at [facility], [address] [place], [place] [address]   Alcohol, Ethyl (B) 01/09/2024 <15  <15 mg/dL Final   Comment: (NOTE) For medical purposes only. Performed at [facility], [address] [place], [place] [address]    WBC 01/09/2024 11.4 (H)  4.0 - 10.5 K/uL Final   RBC 01/09/2024 5.24 (H)  3.87 - 5.11 MIL/uL Final   Hemoglobin 01/09/2024 14.2  12.0 - 15.0 g/dL Final   HCT 93/76/7974 44.5  36.0 - 46.0 % Final   MCV 01/09/2024 84.9  80.0 - 100.0 fL Final   MCH 01/09/2024 27.1  26.0 - 34.0 pg Final   MCHC 01/09/2024 31.9  30.0 - 36.0 g/dL Final   RDW 93/76/7974 15.2  11.5 - 15.5 % Final   Platelets 01/09/2024 348  150 - 400 K/uL Final   nRBC 01/09/2024 0.0  0.0 - 0.2 % Final   Performed at [facility], [address] [place], [place] [address]   Lipase 01/09/2024 39  11 - 51 U/L Final   Performed at [facility], [address] [place], [place] [address]   Color, Urine 01/09/2024 YELLOW (A)  YELLOW Final   APPearance 01/09/2024 HAZY (A)  CLEAR Final   Specific Gravity, Urine 01/09/2024 1.016  1.005 - 1.030 Final   pH 01/09/2024  5.0  5.0 - 8.0 Final   Glucose, UA 01/09/2024 NEGATIVE  NEGATIVE mg/dL Final   Hgb urine dipstick 01/09/2024 NEGATIVE  NEGATIVE Final   Bilirubin Urine 01/09/2024 NEGATIVE  NEGATIVE Final   Ketones, ur 01/09/2024 20 (A)  NEGATIVE mg/dL Final   Protein, ur 93/76/7974 NEGATIVE  NEGATIVE mg/dL Final   Nitrite 93/76/7974 NEGATIVE  NEGATIVE Final   Leukocytes,Ua 01/09/2024 NEGATIVE  NEGATIVE Final   Performed at [facility], [address] [place], [place] [address]   Preg Test, Ur 01/09/2024 NEGATIVE  NEGATIVE Final   Comment:        THE SENSITIVITY OF THIS METHODOLOGY IS >25 mIU/mL. Performed at [facility], [address] [place], [place] [address]     PSYCHIATRIC REVIEW OF SYSTEMS (ROS)  ROS: Notable for the following relevant positive findings: Review of Systems  Psychiatric/Behavioral:  Positive for depression and suicidal ideas. The patient is nervous/anxious and has insomnia.     Additional findings:      Musculoskeletal: No abnormal movements observed      Gait & Station: Laying/Sitting      Pain Screening: Denies      Nutrition & Dental Concerns: n/a  RISK FORMULATION/ASSESSMENT  Is the patient experiencing any suicidal or homicidal ideations: Yes       Explain if yes: Endorses suicidal ideation with thoughts to shoot herself, denies access to firearms, or overdose, denies suicidal intent  Protective factors considered for safety management: Social support, engaged in outpatient mental health treatment   Risk factors/concerns considered for safety management:  Prior attempt Depression  Is there a Astronomer plan with the patient and treatment team to minimize risk factors and promote protective factors: Yes           Explain: Psychiatric hospitalization  Is crisis care placement or psychiatric hospitalization recommended: Yes     Based on my current evaluation and risk assessment, patient is determined at this time to be at:  High  risk  *RISK ASSESSMENT Risk assessment is a dynamic process; it is possible that this patient's condition, and risk level, may change. This should be re-evaluated and managed over time as appropriate. Please re-consult psychiatric consult services if additional assistance is needed in terms of risk assessment and management. If your team decides to discharge this patient, please advise the patient how to best access emergency psychiatric services, or to call 911, if their condition worsens or they feel unsafe in any way.   Erla JAYSON Rase, MD Telepsychiatry Consult Services

## 2024-01-09 NOTE — ED Triage Notes (Signed)
 Pt to ED via POV. Pt states she is having a mental health crisis that has been going on for a week. Pt also states intermittent abd pain.

## 2024-01-09 NOTE — ED Notes (Signed)
 Called to [facility] @ [phone] Per RN Dorian/Accepted to [facility].

## 2024-01-10 DIAGNOSIS — F411 Generalized anxiety disorder: Secondary | ICD-10-CM | POA: Diagnosis not present

## 2024-01-10 MED ORDER — NICOTINE POLACRILEX 2 MG MT GUM: 2.0000 mg | CHEWING_GUM | OROMUCOSAL | Status: DC | PRN

## 2024-01-10 MED ORDER — ARIPIPRAZOLE 5 MG PO TABS
5.0000 mg | ORAL_TABLET | Freq: Every day | ORAL | Status: DC
  Administered 2024-01-10 – 2024-01-11 (×2): 5 mg via ORAL
  Filled 2024-01-10 (×2): qty 1

## 2024-01-10 MED ORDER — HYDROXYZINE HCL 25 MG PO TABS
25.0000 mg | ORAL_TABLET | Freq: Three times a day (TID) | ORAL | Status: DC | PRN
  Administered 2024-01-10 – 2024-01-13 (×4): 25 mg via ORAL
  Filled 2024-01-10 (×4): qty 1

## 2024-01-10 MED ORDER — NICOTINE 21 MG/24HR TD PT24: 21.0000 mg | MEDICATED_PATCH | Freq: Every day | TRANSDERMAL | Status: DC

## 2024-01-10 MED ORDER — TRAZODONE HCL 50 MG PO TABS
50.0000 mg | ORAL_TABLET | Freq: Every evening | ORAL | Status: DC | PRN
  Administered 2024-01-10 – 2024-01-13 (×4): 50 mg via ORAL
  Filled 2024-01-10 (×4): qty 1

## 2024-01-10 MED ORDER — FLUOXETINE HCL 20 MG PO CAPS
20.0000 mg | ORAL_CAPSULE | Freq: Every day | ORAL | Status: DC
  Administered 2024-01-10 – 2024-01-11 (×2): 20 mg via ORAL
  Filled 2024-01-10 (×2): qty 1

## 2024-01-10 NOTE — Progress Notes (Signed)
   01/10/24 1119  Psych Admission Type (Psych Patients Only)  Admission Status Voluntary  Psychosocial Assessment  Patient Complaints Depression  Eye Contact Brief  Facial Expression Anxious  Affect Sad  Speech Logical/coherent  Interaction Assertive  Motor Activity Other (Comment) (wdl)  Appearance/Hygiene In scrubs  Behavior Characteristics Cooperative  Mood Depressed  Thought Process  Coherency WDL  Content WDL  Delusions None reported or observed  Perception WDL  Hallucination None reported or observed  Judgment Poor  Confusion None  Danger to Self  Current suicidal ideation? Denies  Description of Suicide Plan \  Agreement Not to Harm Self Yes  Description of Agreement verbal  Danger to Others  Danger to Others None reported or observed  Danger to Others Abnormal  Harmful Behavior to others No threats or harm toward other people  Destructive Behavior No threats or harm toward property

## 2024-01-10 NOTE — Progress Notes (Signed)
 Tracie James did not attend wrap up group

## 2024-01-10 NOTE — Group Note (Signed)
 Recreation Therapy Group Note   Group Topic:Animal Assisted Therapy   Group Date: 01/10/2024 Start Time: 9053 End Time: 1030 Facilitators: Nari Vannatter-McCall, LRT,CTRS Location: [address] Dayroom   Animal-Assisted Activity (AAA) Program Checklist/Progress Notes Patient Eligibility Criteria Checklist & Daily Group note for Rec Tx Intervention  AAA/T Program Assumption of Risk Form signed by Patient/ or Parent Legal Guardian Yes  Patient understands his/her participation is voluntary Yes  Behavioral Response:    Education: Charity fundraiser, Appropriate Animal Interaction   Education Outcome: Acknowledges education.    Affect/Mood: N/A   Participation Level: Did not attend    Clinical Observations/Individualized Feedback:    Plan: Continue to engage patient in RT group sessions 2-3x/week.   Mihira Tozzi-McCall, LRT,CTRS 01/10/2024 1:25 PM

## 2024-01-10 NOTE — Group Note (Signed)
 LCSW Group Therapy Note   Group Date: 01/10/2024 Start Time: 1100 End Time: 1200   Participation:  did not attend  Type of Therapy:  Group Therapy  Topic:  Shining from Within:  Confidence and Self-Love Journey  Objective:  to support participants in developing confidence and self-love through self-awareness, self-compassion, and practical skills that nurture personal growth.   Group Goals Encourage self-reflection and self-acceptance by identifying personal strengths and achievements. Teach skills to challenge negative self-talk and replace it with supportive, truthful self-talk. Foster resilience and self-worth through [employer], gratitude, and self-care practices.   Summary:  this group explores the connection between confidence and self-love by guiding participants through reflection, mindset shifts, and practical tools like affirmations, strength recognition, and goal-setting. Activities are designed to promote self-compassion, build emotional resilience, and normalize the slow, patient journey of inner growth.   Therapeutic Modalities Used Cognitive Behavioral Therapy (CBT): Challenging and reframing unhelpful self-talk Motivational Interviewing (MI): Encouraging small, achievable goals   Quavis Klutz O Tierria Watson, LCSWA 01/10/2024  7:01 PM

## 2024-01-10 NOTE — Tx Team (Signed)
 Initial Treatment Plan 01/10/2024 2:55 AM Jeniah Jesus [id number]    PATIENT STRESSORS: Occupational concerns   Other: SI     PATIENT STRENGTHS: Supportive family/friends  Work skills    PATIENT IDENTIFIED PROBLEMS: Anxiety  Depression                   DISCHARGE CRITERIA:  Ability to meet basic life and health needs Improved stabilization in mood, thinking, and/or behavior Reduction of life-threatening or endangering symptoms to within safe limits Safe-care adequate arrangements made Verbal commitment to aftercare and medication compliance  PRELIMINARY DISCHARGE PLAN: Participate in family therapy Return to previous living arrangement Return to previous work or school arrangements  PATIENT/FAMILY INVOLVEMENT: This treatment plan has been presented to and reviewed with the patient, Domnique Fetsch. The patient has been given the opportunity to ask questions and make suggestions.  Cruzita Lipa M Henny Strauch, RN 01/10/2024, 2:55 AM

## 2024-01-10 NOTE — Group Note (Addendum)
 Date:  01/10/2024 Time:  11:53 AM  Group Topic/Focus:  Goals Group:   The focus of this group is to help patients establish daily goals to achieve during treatment and discuss how the patient can incorporate goal setting into their daily lives to aide in recovery.    Participation Level:  Did Not Attend  Participation Quality:  na  Affect:  na  Cognitive:  na  Insight: na  Engagement in Group:  na  Modes of Intervention:  na  Additional Comments:  Pt did not attend group  Denece Collet 01/10/2024, 11:53 AM

## 2024-01-10 NOTE — Progress Notes (Signed)
 Patient is a 53 y.o. female with past medical history of depression, anxiety, seizures, fibromyalgia, and chronic pain syndrome who got admitted for increased depression with thoughts of suicide over the past week.  She denies any specific plan, states I would never do it.  She states this is partially due to discomfort that she has been having in the left upper quadrant of her abdomen.  She describes occasional nausea and vomiting, denies any changes in her bowel movements and has not had any fevers, flank pain, or dysuria.  She denies any history of abdominal surgeries, denies alcohol consumption. Patient's skin assessment completed, oriented to the unit now in bed appears asleep. Will continue to monitor and maintain safety.

## 2024-01-10 NOTE — [facility] Suicide Risk Assessment (Signed)
 Suicide Risk Assessment  Admission Assessment    [facility] Admission Suicide Risk Assessment   Nursing information obtained from:  Patient Demographic factors:  Caucasian Current Mental Status:  Suicidal ideation indicated by patient, Self-harm thoughts Loss Factors:  NA Historical Factors:  Family history of mental illness or substance abuse Risk Reduction Factors:  Living with another person, especially a relative  Total Time spent with patient: 45 minutes Principal Problem: GAD (generalized anxiety disorder) Diagnosis:  Principal Problem:   GAD (generalized anxiety disorder)  Subjective Data:  Tracie James is a 53 year old Caucasian female with prior psychiatric diagnoses significant for GAD, MDD, insomnia, and bipolar 2 disorder and past medical history of chronic pain syndrome, fibromyalgia, seizures activities, abdominal pain, obesity, vitamin D  deficiency, and palpitations.  Patient presents voluntarily to [facility]: [facility] from [place]: ED at [facility] depression and abdominal pain in the context of work stressors.    Continued Clinical Symptoms:  Alcohol Use Disorder Identification Test Final Score (AUDIT): 0 The Alcohol Use Disorders Identification Test, Guidelines for Use in Primary Care, Second Edition.  World Science writer [facility]). Score between 0-7:  no or low risk or alcohol related problems. Score between 8-15:  moderate risk of alcohol related problems. Score between 16-19:  high risk of alcohol related problems. Score 20 or above:  warrants further diagnostic evaluation for alcohol dependence and treatment.  CLINICAL FACTORS:   Severe Anxiety and/or Agitation Depression:   Anhedonia Hopelessness Insomnia Severe More than one psychiatric diagnosis Currently Psychotic Previous Psychiatric Diagnoses and Treatments Medical Diagnoses and Treatments/Surgeries  Musculoskeletal: Strength & Muscle Tone: within normal  limits Gait & Station: normal Patient leans: N/A  Psychiatric Specialty Exam:  Presentation  General Appearance:  Appropriate for Environment; Casual; Fairly Groomed  Eye Contact: Fair  Speech: Clear and Coherent  Speech Volume: Normal  Handedness: Right  Mood and Affect  Mood: Anxious; Depressed  Affect: Congruent  Thought Process  Thought Processes: Coherent  Descriptions of Associations:Intact  Orientation:Full (Time, Place and Person)  Thought Content:Logical  History of Schizophrenia/Schizoaffective disorder:No data recorded Duration of Psychotic Symptoms:No data recorded Hallucinations:Hallucinations: Auditory Description of Auditory Hallucinations: Hearing my own voices in my head that will not shut off  Ideas of Reference:None  Suicidal Thoughts:Suicidal Thoughts: Yes, Passive SI Passive Intent and/or Plan: Without Intent; Without Plan; Without Means to Carry Out; Without Access to Means  Homicidal Thoughts:Homicidal Thoughts: No  Sensorium  Memory: Immediate Good; Recent Good  Judgment: Fair  Insight: Fair  Art therapist  Concentration: Fair  Attention Span: Good  Recall: Fair  Fund of Knowledge: Fair  Language: Good  Psychomotor Activity  Psychomotor Activity: Psychomotor Activity: Normal  Assets  Assets: Communication Skills; Desire for Improvement; Physical Health; Resilience  Sleep  Sleep: Sleep: Fair Number of Hours of Sleep: -- (No sleep hours recorded in nursing flowsheet)  Physical Exam: Physical Exam Constitutional:      Appearance: She is obese.  HENT:     Head: Normocephalic.     Right Ear: External ear normal.     Left Ear: External ear normal.     Nose: Nose normal.     Mouth/Throat:     Mouth: Mucous membranes are moist.   Eyes:     Extraocular Movements: Extraocular movements intact.    Cardiovascular:     Rate and Rhythm: Tachycardia present.  Pulmonary:     Effort: Pulmonary  effort is normal.  Abdominal:     Comments: Deferred  Genitourinary:  Comments: Deferred  Musculoskeletal:        General: Normal range of motion.     Cervical back: Normal range of motion.   Skin:    General: Skin is warm.   Neurological:     General: No focal deficit present.     Mental Status: She is alert and oriented to person, place, and time.   Psychiatric:        Mood and Affect: Mood normal.        Behavior: Behavior normal.        Thought Content: Thought content normal.    Review of Systems  Constitutional:  Negative for chills and fever.  HENT:  Negative for sore throat.   Eyes:  Negative for blurred vision.  Respiratory:  Negative for cough, sputum production, shortness of breath and wheezing.   Cardiovascular:  Negative for chest pain and palpitations.  Gastrointestinal:  Negative for heartburn and nausea.  Genitourinary:  Negative for dysuria and urgency.  Musculoskeletal:  Negative for falls.  Skin:  Negative for itching and rash.  Neurological:  Negative for dizziness, tingling and headaches.  Endo/Heme/Allergies:        See allergy listing  Psychiatric/Behavioral:  Positive for depression, hallucinations and suicidal ideas. Negative for substance abuse. The patient is nervous/anxious. The patient does not have insomnia.    Blood pressure (!) 140/84, pulse (!) 117, temperature 98.4 F (36.9 C), temperature source Oral, resp. rate 18, height 5' 4 (1.626 m), weight 90.7 kg, SpO2 100%. Body mass index is 34.33 kg/m.  COGNITIVE FEATURES THAT CONTRIBUTE TO RISK:  Polarized thinking    SUICIDE RISK:   Severe:  Frequent, intense, and enduring suicidal ideation, specific plan, no subjective intent, but some objective markers of intent (i.e., choice of lethal method), the method is accessible, some limited preparatory behavior, evidence of impaired self-control, severe dysphoria/symptomatology, multiple risk factors present, and few if any protective factors,  particularly a lack of social support.  PLAN OF CARE: Treatment Plan Summary: Daily contact with patient to assess and evaluate symptoms and progress in treatment and Medication management  Physician Treatment Plan for Primary Diagnosis:  Assessment::  Tiarna Plotner is a 53 year old Caucasian female with prior psychiatric diagnoses significant for GAD, MDD, insomnia, and bipolar 2 disorder and past medical history of chronic pain syndrome, fibromyalgia, seizures activities, abdominal pain, obesity, vitamin D  deficiency, and palpitations.  Patient presents voluntarily to [facility]: [facility] from [place]: ED at [facility] depression and abdominal pain in the context of work stressors.   GAD (generalized anxiety disorder)  Plans: Medications: -- Discontinue Zyprexa 5 mg p.o. daily for psychosis -- Initiate Abilify  5 mg p.o. daily for mood disorder and augmentation of antidepressant -- Initiate Prozac  tablets 20 mg p.o. daily for depression and anxiety -- Initiate hydroxyzine  tablets  25 mg p.o. 3 times daily as needed for anxiety -- Initiate trazodone  tablet 50 mg p.o. as needed at bedtime for insomnia -- Continue BuSpar  tablets 10 mg p.o. 3 times daily for anxiety  Medication for other medical problems:: -- Continue Toprol  XL 50 mg 24-hour tablets p.o. daily for high blood pressure -- Continue iron  sulfate 160 mg p.o. daily for anemia -- Protonix 40 mg p.o. tablet for GERD --Continue vitamin D  colecalciferol 1000 units p.o. daily for vitamin D  deficiency --Gabapentin  400 mg p.o. daily at bedtime for nerve pain --Continue Augmentin   tablet  500-125 mg tablets   p.o. 3 times daily x 5 days  Other PRN Medications  -Acetaminophen 650 mg every 6 as needed/mild pain  -Maalox 30 mL oral every 4 as needed/digestion  -Magnesium hydroxide 30 mL daily as needed/mild constipation   --The risks/benefits/side-effects/alternatives to this medication were discussed  in detail with the patient and time was given for questions. The patient consents to medication trial.   -- Metabolic profile and EKG monitoring obtained while on an atypical antipsychotic (BMI: Lipid Panel: HbgA1c: QTc:)   - Encouraged patient to participate in unit milieu and in scheduled group therapies   Continue BH Agitation Protocol  -Haldol 5 mg, oral, 3 times daily as needed, mild agitation  --Benadryl 50 mg, oral, 3 times daily as needed, mild agitation                                      OR   --Haldol injection 5 mg, IM, 3 times daily as needed, moderate agitation  --Benadryl injection 50 mg, IM, 3 times daily as needed, moderate agitation  --Ativan  injection 2 mg, IM, 3 times daily as needed, moderate agitation                                      OR  --Haldol injection 10 mg, IM, 3 times daily as needed, severe agitation  --Benadryl injection 50 mg, IM, 3 times daily as needed, severe agitation  --Ativan  injection 2 mg, IM, 3 times daily as needed, severe agitation   Admission labs reviewed:: CMP: Glucose 117, creatinine 1.08, AST 111, ALT 152, otherwise normal.  CBC with differential WBC 11.4, RBC 5.24.  Otherwise normal.  Pregnancy test negative .  BAL less than 15.  UDS: Positive for marijuana  New labs ordered:: Vitamin D  25-hydroxy, vitamin B-12, lipid panel, TSH, hemoglobin A1c.  EKG reviewed:: Sinus tachycardia with fusion complexes, ventricular rate 125, QT/QTc 304/4088.  Safety and Monitoring:  Voluntary admission to inpatient psychiatric unit for safety, stabilization and treatment  Daily contact with patient to assess and evaluate symptoms and progress in treatment  Patient's case to be discussed in multi-disciplinary team meeting  Observation Level : q15 minute checks  Vital signs: q12 hours  Precautions: suicide, but pt currently verbally contracts for safety on unit?   Discharge Planning:  Social work and case management to assist with discharge planning and  identification of hospital follow-up needs prior to discharge  Estimated LOS: 5-7?days  Discharge Concerns: Need to establish a safety plan; Medication compliance and effectiveness  Discharge Goals: Return home with outpatient referrals for mental health follow-up including medication management/psychotherapy.   Long Term Goal(s): Improvement in symptoms so as ready for discharge  Short Term Goals: Ability to identify changes in lifestyle to reduce recurrence of condition will improve, Ability to verbalize feelings will improve, Ability to disclose and discuss suicidal ideas, Ability to demonstrate self-control will improve, Ability to identify and develop effective coping behaviors will improve, Ability to maintain clinical measurements within normal limits will improve, Compliance with prescribed medications will improve, and Ability to identify triggers associated with substance abuse/mental health issues will improve  Physician Treatment Plan for Secondary Diagnosis: Principal Problem:   GAD (generalized anxiety disorder) MDD Bipolar 2 disorder Suicidal ideation Insomnia  I certify that inpatient services furnished can reasonably be expected to improve the patient's condition.   Ellouise JAYSON Azure, FNP 01/10/2024, 5:09  PM

## 2024-01-10 NOTE — H&P (Signed)
 Psychiatric Admission Assessment Adult  Patient Identification: Tracie James MRN:  [id number] Date of Evaluation:  01/10/2024 Chief Complaint:  GAD (generalized anxiety disorder) [F41.1] Principal Diagnosis: GAD (generalized anxiety disorder) Diagnosis:  Principal Problem:   GAD (generalized anxiety disorder) MDD Bipolar 2 disorder Suicidal ideation Insomnia  CC: I had mental health crisis x 1 week.    History of Present Illness: Tracie James is a 53 year old Caucasian female with prior psychiatric diagnoses significant for GAD, MDD, insomnia, and bipolar 2 disorder and past medical history of chronic pain syndrome, fibromyalgia, seizures activities, abdominal pain, obesity, vitamin D  deficiency, and palpitations.  Patient presents voluntarily to [facility] from [facility] ED at [facility] for depression and abdominal pain in the context of work stressors.  After medical evaluation / stabilization & clearance, she was transferred to the [facility] for further psychiatric evaluation & treatments..  During this evaluation, patient presented tearful, anxious,  but cooperative.  She was crying throughout this assessment.  Patient reports significant worsening of depression and anxiety in the past week. Patient reports she is afraid to go to the work due to I'm afraid I'm going to do my job wrong. I don't want to go to work and misdiagnose someone. 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 States she has been so distressed she has not gotten out of bed for 5 days. Endorses racing thoughts. Patient reports her tooth has been hurting   after an extraction, states she has not been able to eat or sleep due to the pain. And states since she has not been eating she has not taken her medication since Thursday.  Patient repeatedly states, I'm scared! I'm scared! I'm petrified to go to work. Patient reports nothing happened at work, but she started to feel  worried about work. Patient endorses depressed mood and feelings of hopelessness. Patient endorses  passive suicidal ideation with thoughts to shoot herself in the head and has had thoughts to overdose on medication. Denies access to firearms. Denies suicidal intent. Reports suicidal thoughts have intensified  from baseline suicidal thoughts. When inquire if patient has things for which she wants to live she states, I don't know. Patient reports she has 2 children who don't live with her, only speaks to one of them. States she lives with her boyfriend. Patient able to contract for safety while in the hospital.  Patient report depression symptoms to include worthlessness, hopelessness, sadness, psychomotor agitation, sleeplessness, poor appetite, anhedonia, self-isolation.  She reports symptoms of anxiety to include worrying too much, increased heart rate, shakiness, and agitation.  She reports symptoms of mania x 2 in the past 10 years.  Denies symptoms of PTSD, OCD.  Patient reports being on trial of lithium, Celebrex, Zoloft, Wellbutrin , Prozac , Vraylar , Abilify, and Seroquel.  Added that Seroquel made her gain weight and put her on a comma state.  Reports no, no, no to Seroquel   Objective: On evaluation, patient noted to be tearful, anxious, cooperative, linear, not appearing internally preoccupied, not responding to internal stimuli, alert and oriented x 4.  Speech clear, coherent with normal volume and pattern.  Able to effectively participate in answering assessment questions.  Thought process and thought content coherent logical and organized.  She denies delusional thinking or paranoia.  Further denies HI, or VH.  Patient continues to endorse passive suicidal ideation, and auditory hallucination of hearing her own voice in her head that she cannot shut up.  Vital signs  reviewed with blood pressure of 140/84, and pulse rate of 117.  Labs reviewed and EKG reviewed as indicated in the treatment plan.   Patient is admitted for mood stabilization, medication management, and safety.  Mode of transport to Hospital: Safe transport Current Outpatient (Home) Medication List: See medication listing PRN medication prior to evaluation: Seeing a medication listing  ED course: Labs and EKG will be obtained and analyze Collateral Information: None obtained at this time POA/Legal Guardian: Patient is her own legal guardian  Past Psychiatric Hx: Previous Psych Diagnoses: MDD, bipolar 2 disorder, GAD, SI, and insomnia Prior inpatient treatment: Yes x 1 in [place]  Current/prior outpatient treatment: Denies Prior rehab hx: Denies Psychotherapy hx: Yes History of suicide: Yes History of homicide or aggression: Denies Psychiatric medication history: Patient has been on trial of lithium, Celebrex, Zoloft, Wellbutrin , Prozac , regular, Abilify, and Seroquel Psychiatric medication compliance history: Reports compliance Neuromodulation history: Denies Current Psychiatrist: At [facility] Current therapist: Denies  Substance Abuse Hx: Alcohol: Denies Tobacco: Denies, endorsing vaping as needed Illicit drugs: Denies Rx drug abuse: Denies Rehab hx: The denies  Past Medical History: Medical Diagnoses: Seizure disorder, asthma, fibromyalgia, chronic pain syndrome Home Rx: Yes Prior Hosp: Denies Prior Surgeries/Trauma: Denies Head trauma, LOC, concussions, seizures: History of seizures Allergies: Erythromycin Drug Ingredient Hives Not Specified 04/13/2012 Past Updates Tetracyclines & Related Drug Class Hives Not Specified 04/13/2012 Past Updates  LMP: Not applicable Contraception: Not applicable PCP: Yes at [facility]  Family History: Medical: Denies Psych: Denies Psych Rx: Denies SA/HA: Denies Substance use family hx: Denies  Social History: Childhood (bring, raised, lives now, parents, siblings, schooling, education): Associate's degree in lab technology   Marital Status: Divorced Sexual orientation: Female from [place] Children: 2 children ages 24 and 59 Employment: Employed as a Art therapist Group: Denies Housing: Domiciled with the boyfriend Finances: Denies Water quality scientist: Denies Special educational needs teacher: Denies affiliation with the [employer]  Associated Signs/Symptoms: Depression Symptoms:  depressed mood, anhedonia, insomnia, fatigue, feelings of worthlessness/guilt, difficulty concentrating, hopelessness, recurrent thoughts of death, suicidal thoughts without plan, anxiety, loss of energy/fatigue, disturbed sleep, weight gain, decreased labido, (Hypo) Manic Symptoms:  Distractibility, Hallucinations, Irritable Mood, Anxiety Symptoms:  Excessive Worry, Psychotic Symptoms:  Hearing voices denies head PTSD Symptoms: NA  Total Time spent with patient: 1.5 hours  Is the patient at risk to self? Yes.    Has the patient been a risk to self in the past 6 months? Yes.    Has the patient been a risk to self within the distant past? Yes.    Is the patient a risk to others? No.  Has the patient been a risk to others in the past 6 months? No.  Has the patient been a risk to others within the distant past? No.   [place] Scale:  Flowsheet Row [facility] ([facility] Most recent reading at 01/09/2024 10:29 PM ED from 01/09/2024 in [facility] at [facility] Most recent reading at 01/09/2024 10:40 AM Office Visit from 08/31/2021 in confidential department Most recent reading at 08/31/2021  9:13 AM  C-SSRS RISK CATEGORY Moderate Risk Moderate Risk Low Risk   Alcohol Screening: 1. How often do you have a drink containing alcohol?: Never 2. How many drinks containing alcohol do you have on a typical day when you are drinking?: 1 or 2 3. How often do you have six or more drinks on one occasion?: Never AUDIT-C Score: 0  4. How often  during the last year have you found that you were not able to stop drinking once you had started?: Never 5. How often during the last year have you failed to do what was normally expected from you because of drinking?: Never 6. How often during the last year have you needed a first drink in the morning to get yourself going after a heavy drinking session?: Never 7. How often during the last year have you had a feeling of guilt of remorse after drinking?: Never 8. How often during the last year have you been unable to remember what happened the night before because you had been drinking?: Never 9. Have you or someone else been injured as a result of your drinking?: No 10. Has a relative or friend or a doctor or another health worker been concerned about your drinking or suggested you cut down?: No Alcohol Use Disorder Identification Test Final Score (AUDIT): 0 Alcohol Brief Interventions/Follow-up: Alcohol education/Brief advice Substance Abuse History in the last 12 months:  No. Consequences of Substance Abuse: Discussed with patient during this admission evaluation.   Medical Consequences: Liver damage, Possible death by overdose   Legal Consequences: Arrests, jail time, Loss of driving privilege.   Family Consequences: Family discord, divorce and or separation.  Previous Psychotropic Medications: Yes  Psychological Evaluations: Yes  Past Medical History:  Past Medical History:  Diagnosis Date   Anxiety    Asthma    Bipolar 2 disorder (HCC)    Depression    GERD (gastroesophageal reflux disease)    Palpitations    PVC (premature ventricular contraction)     Past Surgical History:  Procedure Laterality Date   APPENDECTOMY     TOTAL ABDOMINAL HYSTERECTOMY     Family History:  Family History  Problem Relation Age of Onset   Depression Mother    Multiple sclerosis Mother    Schizophrenia Maternal Grandmother    Heart disease Maternal Grandmother        aortic valve replacement    Tobacco Screening:  Social History   Tobacco Use  Smoking Status Former   Current packs/day: 0.00   Types: Cigarettes   Quit date: 03/31/2014   Years since quitting: 9.7  Smokeless Tobacco Never    BH Tobacco Counseling     Are you interested in Tobacco Cessation Medications?  No value filed. Counseled patient on smoking cessation:  No value filed. Reason Tobacco Screening Not Completed: No value filed.    Social History:  Social History   Substance and Sexual Activity  Alcohol Use No     Social History   Substance and Sexual Activity  Drug Use No    Additional Social History:    Allergies:   Allergies  Allergen Reactions   Erythromycin Hives   Tetracyclines & Related Hives   Lab Results:  Results for orders placed or performed during the hospital encounter of 01/09/24 (from the past 48 hours)  Comprehensive metabolic panel     Status: Abnormal   Collection Time: 01/09/24 10:59 AM  Result Value Ref Range   Sodium 136 135 - 145 mmol/L   Potassium 3.8 3.5 - 5.1 mmol/L   Chloride 101 98 - 111 mmol/L   CO2 19 (L) 22 - 32 mmol/L   Glucose, Bld 117 (H) 70 - 99 mg/dL    Comment: Glucose reference range applies only to samples taken after fasting for at least 8 hours.   BUN 14 6 - 20 mg/dL  Creatinine, Ser 1.08 (H) 0.44 - 1.00 mg/dL   Calcium 9.8 8.9 - 89.6 mg/dL   Total Protein 7.8 6.5 - 8.1 g/dL   Albumin 4.3 3.5 - 5.0 g/dL   AST 888 (H) 15 - 41 U/L   ALT 152 (H) 0 - 44 U/L   Alkaline Phosphatase 85 38 - 126 U/L   Total Bilirubin 1.2 0.0 - 1.2 mg/dL   GFR, Estimated >39 >39 mL/min    Comment: (NOTE) Calculated using the CKD-EPI Creatinine Equation (2021)    Anion gap 16 (H) 5 - 15    Comment: Performed at [facility], [address] [place], [place] [address]  Ethanol     Status: None   Collection Time: 01/09/24 10:59 AM  Result Value Ref Range   Alcohol, Ethyl (B) <15 <15 mg/dL    Comment: (NOTE) For medical purposes only. Performed at  [facility], [address] [place], [place] [address]   cbc     Status: Abnormal   Collection Time: 01/09/24 10:59 AM  Result Value Ref Range   WBC 11.4 (H) 4.0 - 10.5 K/uL   RBC 5.24 (H) 3.87 - 5.11 MIL/uL   Hemoglobin 14.2 12.0 - 15.0 g/dL   HCT 55.4 63.9 - 53.9 %   MCV 84.9 80.0 - 100.0 fL   MCH 27.1 26.0 - 34.0 pg   MCHC 31.9 30.0 - 36.0 g/dL   RDW 84.7 88.4 - 84.4 %   Platelets 348 150 - 400 K/uL   nRBC 0.0 0.0 - 0.2 %    Comment: Performed at [facility], [address] [place], [place] [address]  Urine Drug Screen, Qualitative     Status: Abnormal   Collection Time: 01/09/24 10:59 AM  Result Value Ref Range   Tricyclic, Ur Screen NONE DETECTED NONE DETECTED   Amphetamines, Ur Screen NONE DETECTED NONE DETECTED   MDMA (Ecstasy)Ur Screen NONE DETECTED NONE DETECTED   Cocaine Metabolite,Ur [place] NONE DETECTED NONE DETECTED   Opiate, Ur Screen NONE DETECTED NONE DETECTED   Phencyclidine (PCP) Ur S NONE DETECTED NONE DETECTED   Cannabinoid 50 Ng, Ur [place] POSITIVE (A) NONE DETECTED   Barbiturates, Ur Screen NONE DETECTED NONE DETECTED   Benzodiazepine, Ur Scrn NONE DETECTED NONE DETECTED   Methadone Scn, Ur NONE DETECTED NONE DETECTED    Comment: (NOTE) Tricyclics + metabolites, urine    Cutoff 1000 ng/mL Amphetamines + metabolites, urine  Cutoff 1000 ng/mL MDMA (Ecstasy), urine              Cutoff 500 ng/mL Cocaine Metabolite, urine          Cutoff 300 ng/mL Opiate + metabolites, urine        Cutoff 300 ng/mL Phencyclidine (PCP), urine         Cutoff 25 ng/mL Cannabinoid, urine                 Cutoff 50 ng/mL Barbiturates + metabolites, urine  Cutoff 200 ng/mL Benzodiazepine, urine              Cutoff 200 ng/mL Methadone, urine                   Cutoff 300 ng/mL  The urine drug screen provides only a preliminary, unconfirmed analytical test result and should not be used for non-medical purposes. Clinical consideration and professional judgment  should be applied to any positive drug screen result due to possible interfering substances. A more specific alternate chemical  method must be used in order to obtain a confirmed analytical result. Gas chromatography / mass spectrometry (GC/MS) is the preferred confirm atory method. Performed at [facility], [address] [place], [place] [address]   Lipase, blood     Status: None   Collection Time: 01/09/24 10:59 AM  Result Value Ref Range   Lipase 39 11 - 51 U/L    Comment: Performed at [facility], [address] [place], [place] [address]  Urinalysis, Routine w reflex microscopic -Urine, Clean Catch     Status: Abnormal   Collection Time: 01/09/24 10:59 AM  Result Value Ref Range   Color, Urine YELLOW (A) YELLOW   APPearance HAZY (A) CLEAR   Specific Gravity, Urine 1.016 1.005 - 1.030   pH 5.0 5.0 - 8.0   Glucose, UA NEGATIVE NEGATIVE mg/dL   Hgb urine dipstick NEGATIVE NEGATIVE   Bilirubin Urine NEGATIVE NEGATIVE   Ketones, ur 20 (A) NEGATIVE mg/dL   Protein, ur NEGATIVE NEGATIVE mg/dL   Nitrite NEGATIVE NEGATIVE   Leukocytes,Ua NEGATIVE NEGATIVE    Comment: Performed at [facility], [address] [place], [place] [address]  Pregnancy, urine     Status: None   Collection Time: 01/09/24 10:59 AM  Result Value Ref Range   Preg Test, Ur NEGATIVE NEGATIVE    Comment:        THE SENSITIVITY OF THIS METHODOLOGY IS >25 mIU/mL. Performed at [facility], [address] [place], [place] [address]   Hepatitis panel, acute     Status: None   Collection Time: 01/09/24 10:59 AM  Result Value Ref Range   Hepatitis B Surface Ag NON REACTIVE NON REACTIVE   HCV Ab NON REACTIVE NON REACTIVE    Comment: (NOTE) Nonreactive HCV antibody screen is consistent with no HCV infections,  unless recent infection is suspected or other evidence exists to indicate HCV infection.     Hep A IgM NON REACTIVE NON REACTIVE   Hep B C IgM NON REACTIVE  NON REACTIVE    Comment: Performed at [facility], [address], [place], [place] [address]   Blood Alcohol level:  Lab Results  Component Value Date   [facility] <15 01/09/2024    Metabolic Disorder Labs:  Lab Results  Component Value Date   HGBA1C 5.6 04/25/2023   No results found for: PROLACTIN Lab Results  Component Value Date   CHOL 195 04/25/2023   TRIG 109 04/25/2023   HDL 66 04/25/2023   LDLCALC 110 (H) 04/25/2023   Current Medications: Current Facility-Administered Medications  Medication Dose Route Frequency Provider Last Rate Last Admin   acetaminophen (TYLENOL) tablet 650 mg  650 mg Oral Q6H PRN Trudy Carwin, NP       alum & mag hydroxide-simeth (MAALOX/MYLANTA) 200-200-20 MG/5ML suspension 30 mL  30 mL Oral Q4H PRN Trudy Carwin, NP       magnesium hydroxide (MILK OF MAGNESIA) suspension 30 mL  30 mL Oral Daily PRN Trudy Carwin, NP       OLANZapine (ZYPREXA) injection 10 mg  10 mg Intramuscular TID PRN Trudy Carwin, NP       OLANZapine (ZYPREXA) injection 5 mg  5 mg Intramuscular TID PRN Trudy Carwin, NP       OLANZapine zydis (ZYPREXA) disintegrating tablet 5 mg  5 mg Oral TID PRN Trudy Carwin, NP   5 mg at 01/09/24 2235   PTA Medications: Medications Prior to Admission  Medication Sig Dispense Refill Last  Dose/Taking   amoxicillin-clavulanate (AUGMENTIN) 500-125 MG tablet Take 1 tablet by mouth 3 (three) times daily.   Taking   buPROPion  (WELLBUTRIN  XL) 150 MG 24 hr tablet Take 1 tablet (150 mg total) by mouth daily. Take total of 450 mg daily. Along with 300 mg tab (Patient taking differently: Take 150 mg by mouth at bedtime. Take total of 450 mg daily. Along with 300 mg tab) 90 tablet 1 Past Week   buPROPion  (WELLBUTRIN  XL) 300 MG 24 hr tablet Take 1 tablet (300 mg total) by mouth daily. TAKE 1 TABLET DAILY AND 150 MG DAILY (TOTAL OF 450 MG) (Patient taking differently: Take 300 mg by mouth at bedtime. TAKE 1 TABLET DAILY AND 150 MG DAILY (TOTAL OF 450  MG)) 90 tablet 1 Past Week   [START ON 01/25/2024] busPIRone  (BUSPAR ) 10 MG tablet Take 1 tablet (10 mg total) by mouth 3 (three) times daily. 270 tablet 0 Past Week   ferrous sulfate (SLOW IRON ) 160 (50 Fe) MG TBCR SR tablet Take 1 tablet (160 mg total) by mouth daily. (Patient taking differently: Take 1 tablet by mouth at bedtime.) 90 tablet 1 Past Week   FLUoxetine  (PROZAC ) 40 MG capsule Take 2 capsules (80 mg total) by mouth daily. (Patient taking differently: Take 80 mg by mouth at bedtime.) 180 capsule 1 Past Week   gabapentin  (NEURONTIN ) 400 MG tablet Take 1 tablet (400 mg total) by mouth at bedtime. 30 tablet 11 Past Week   LORazepam  (ATIVAN ) 1 MG tablet Take 1 mg by mouth daily as needed for anxiety.   Past Week   metoprolol  succinate (TOPROL -XL) 50 MG 24 hr tablet Take 1 tablet (50 mg total) by mouth daily. Take with or immediately following a meal.Take with or immediately following a meal (Patient taking differently: Take 50 mg by mouth at bedtime. Take with or immediately following a meal.Take with or immediately following a meal) 90 tablet 3 Past Week   Multiple Vitamin (MULTIVITAMIN WITH MINERALS) TABS tablet Take 1 tablet by mouth at bedtime.   Past Week   naproxen  (NAPROSYN ) 500 MG tablet TAKE 1 TABLET BY MOUTH TWICE A DAY WITH FOOD (Patient taking differently: Take 1,000 mg by mouth at bedtime.) 180 tablet 3 Past Week   pantoprazole (PROTONIX) 40 MG tablet Take 1 tablet (40 mg total) by mouth daily for 14 days. (Patient taking differently: Take 40 mg by mouth at bedtime.) 14 tablet 0 Past Week   traZODone  (DESYREL ) 50 MG tablet Take 0.5-1 tablets (25-50 mg total) by mouth at bedtime. 90 tablet 0 Past Week   Vitamin D , Cholecalciferol, 1000 UNITS TABS Take 5,000 Units by mouth at bedtime.   Past Week   AIMS:    Musculoskeletal: Strength & Muscle Tone: within normal limits Gait & Station: normal Patient leans: N/A  Psychiatric Specialty Exam:  Presentation  General Appearance:   Appropriate for Environment; Casual; Fairly Groomed  Eye Contact: Fair  Speech: Clear and Coherent  Speech Volume: Normal  Handedness: Right  Mood and Affect  Mood: Anxious; Depressed  Affect: Congruent  Thought Process  Thought Processes: Coherent  Duration of Psychotic Symptoms:N/A Past Diagnosis of Schizophrenia or Psychoactive disorder: No data recorded Descriptions of Associations:Intact  Orientation:Full (Time, Place and Person)  Thought Content:Logical  Hallucinations:Hallucinations: Auditory Description of Auditory Hallucinations: Hearing my own voices in my head that will not shut off  Ideas of Reference:None  Suicidal Thoughts:Suicidal Thoughts: Yes, Passive SI Passive Intent and/or Plan: Without Intent; Without Plan; Without Means to  Carry Out; Without Access to Means  Homicidal Thoughts:Homicidal Thoughts: No  Sensorium  Memory: Immediate Good; Recent Good  Judgment: Fair  Insight: Fair  Art therapist  Concentration: Fair  Attention Span: Good  Recall: Fair  Fund of Knowledge: Fair  Language: Good  Psychomotor Activity  Psychomotor Activity: Psychomotor Activity: Normal  Assets  Assets: Communication Skills; Desire for Improvement; Physical Health; Resilience  Sleep  Sleep: Sleep: Fair  Estimated Sleeping Duration (Last 24 Hours): 6.50 hours  Physical Exam: Physical Exam Vitals and nursing note reviewed.  Constitutional:      General: She is not in acute distress.    Appearance: She is obese. She is not ill-appearing.  HENT:     Head: Normocephalic.     Right Ear: External ear normal.     Left Ear: External ear normal.     Nose: Nose normal.     Mouth/Throat:     Mouth: Mucous membranes are moist.     Pharynx: Oropharynx is clear.   Eyes:     Extraocular Movements: Extraocular movements intact.    Cardiovascular:     Rate and Rhythm: Tachycardia present.  Pulmonary:     Effort: Pulmonary  effort is normal.  Abdominal:     Comments: Deferred.  Genitourinary:    Comments: Deferred  Musculoskeletal:        General: Normal range of motion.     Cervical back: Normal range of motion.   Skin:    General: Skin is warm.   Neurological:     General: No focal deficit present.     Mental Status: She is alert and oriented to person, place, and time.   Psychiatric:        Mood and Affect: Mood normal.        Behavior: Behavior normal.    Review of Systems  Constitutional:  Negative for chills and fever.  HENT:  Negative for sore throat.   Eyes:  Negative for blurred vision.  Respiratory:  Negative for cough, sputum production, shortness of breath and wheezing.   Cardiovascular:  Negative for chest pain and palpitations.  Gastrointestinal:  Negative for heartburn and nausea.  Musculoskeletal:  Positive for myalgias (History of fibromyalgia). Negative for falls.  Skin:  Negative for itching and rash.  Neurological:  Positive for seizures (History of seizures, last seizure activity in 2022). Negative for dizziness and headaches.  Endo/Heme/Allergies:        See allergy listing  Psychiatric/Behavioral:  Positive for depression, hallucinations and suicidal ideas. Negative for substance abuse. The patient is nervous/anxious. The patient does not have insomnia.    Blood pressure (!) 140/84, pulse (!) 117, temperature 98.4 F (36.9 C), temperature source Oral, resp. rate 18, height 5' 4 (1.626 m), weight 90.7 kg, SpO2 100%. Body mass index is 34.33 kg/m.  Treatment Plan Summary: Daily contact with patient to assess and evaluate symptoms and progress in treatment and Medication management  Physician Treatment Plan for Primary Diagnosis:  Assessment::  Kalley Achee is a 53 year old Caucasian female with prior psychiatric diagnoses significant for GAD, MDD, insomnia, and bipolar 2 disorder and past medical history of chronic pain syndrome, fibromyalgia, seizures activities,  abdominal pain, obesity, vitamin D  deficiency, and palpitations.  Patient presents voluntarily to [facility] from [place]: ED at [facility] depression and abdominal pain in the context of work stressors.   GAD (generalized anxiety disorder)  Plans: Medications: -- Discontinue Zyprexa 5 mg p.o. daily for psychosis -- Initiate  Abilify  5 mg p.o. daily for mood disorder and augmentation of antidepressant -- Initiate Prozac  tablets 20 mg p.o. daily for depression and anxiety -- Initiate hydroxyzine  tablets  25 mg p.o. 3 times daily as needed for anxiety -- Initiate trazodone  tablet 50 mg p.o. as needed at bedtime for insomnia -- Continue BuSpar  tablets 10 mg p.o. 3 times daily for anxiety  Medication for other medical problems:: -- Continue Toprol  XL 50 mg 24-hour tablets p.o. daily for high blood pressure -- Continue iron  sulfate 160 mg p.o. daily for anemia -- Protonix 40 mg p.o. tablet for GERD --Continue vitamin D  colecalciferol 1000 units p.o. daily for vitamin D  deficiency --Gabapentin  400 mg p.o. daily at bedtime for nerve pain --Continue Augmentin   tablet  500-125 mg tablets   p.o. 3 times daily x 5 days  Other PRN Medications  -Acetaminophen 650 mg every 6 as needed/mild pain  -Maalox 30 mL oral every 4 as needed/digestion  -Magnesium hydroxide 30 mL daily as needed/mild constipation   --The risks/benefits/side-effects/alternatives to this medication were discussed in detail with the patient and time was given for questions. The patient consents to medication trial.   -- Metabolic profile and EKG monitoring obtained while on an atypical antipsychotic (BMI: Lipid Panel: HbgA1c: QTc:)   - Encouraged patient to participate in unit milieu and in scheduled group therapies   Continue BH Agitation Protocol  -Haldol 5 mg, oral, 3 times daily as needed, mild agitation  --Benadryl 50 mg, oral, 3 times daily as needed, mild agitation                                       OR   --Haldol injection 5 mg, IM, 3 times daily as needed, moderate agitation  --Benadryl injection 50 mg, IM, 3 times daily as needed, moderate agitation  --Ativan  injection 2 mg, IM, 3 times daily as needed, moderate agitation                                      OR  --Haldol injection 10 mg, IM, 3 times daily as needed, severe agitation  --Benadryl injection 50 mg, IM, 3 times daily as needed, severe agitation  --Ativan  injection 2 mg, IM, 3 times daily as needed, severe agitation   Admission labs reviewed:: CMP: Glucose 117, creatinine 1.08, AST 111, ALT 152, otherwise normal.  CBC with differential WBC 11.4, RBC 5.24.  Otherwise normal.  Pregnancy test negative .  BAL less than 15.  UDS: Positive for marijuana  New labs ordered:: Vitamin D  25-hydroxy, vitamin B-12, lipid panel, TSH, hemoglobin A1c.  EKG reviewed:: Sinus tachycardia with fusion complexes, ventricular rate 125, QT/QTc 304/4088.  Safety and Monitoring:  Voluntary admission to inpatient psychiatric unit for safety, stabilization and treatment  Daily contact with patient to assess and evaluate symptoms and progress in treatment  Patient's case to be discussed in multi-disciplinary team meeting  Observation Level : q15 minute checks  Vital signs: q12 hours  Precautions: suicide, but pt currently verbally contracts for safety on unit?   Discharge Planning:  Social work and case management to assist with discharge planning and identification of hospital follow-up needs prior to discharge  Estimated LOS: 5-7?days  Discharge Concerns: Need to establish a safety plan; Medication compliance and effectiveness  Discharge Goals: Return home with  outpatient referrals for mental health follow-up including medication management/psychotherapy.   Long Term Goal(s): Improvement in symptoms so as ready for discharge  Short Term Goals: Ability to identify changes in lifestyle to reduce recurrence of condition  will improve, Ability to verbalize feelings will improve, Ability to disclose and discuss suicidal ideas, Ability to demonstrate self-control will improve, Ability to identify and develop effective coping behaviors will improve, Ability to maintain clinical measurements within normal limits will improve, Compliance with prescribed medications will improve, and Ability to identify triggers associated with substance abuse/mental health issues will improve  Physician Treatment Plan for Secondary Diagnosis: Principal Problem:   GAD (generalized anxiety disorder) MDD Bipolar 2 disorder Suicidal ideation Insomnia  I certify that inpatient services furnished can reasonably be expected to improve the patient's condition.    Emmett Arntz C Volanda Mangine, FNP 6/24/20255:14 PM

## 2024-01-11 ENCOUNTER — Encounter (HOSPITAL_COMMUNITY): Payer: Self-pay

## 2024-01-11 DIAGNOSIS — F411 Generalized anxiety disorder: Secondary | ICD-10-CM | POA: Diagnosis not present

## 2024-01-11 LAB — LIPID PANEL
Cholesterol: 313 mg/dL — ABNORMAL HIGH (ref 0–200)
HDL: 63 mg/dL (ref >40)
LDL Cholesterol: 218 mg/dL — ABNORMAL HIGH (ref 0–99)
Total CHOL/HDL Ratio: 5 ratio
Triglycerides: 158 mg/dL — ABNORMAL HIGH (ref <150)
VLDL: 32 mg/dL (ref 0–40)

## 2024-01-11 LAB — HEMOGLOBIN A1C
Hgb A1c MFr Bld: 5 % (ref 4.8–5.6)
Mean Plasma Glucose: 96.8 mg/dL

## 2024-01-11 LAB — TSH: TSH: 2.284 u[IU]/mL (ref 0.350–4.500)

## 2024-01-11 LAB — VITAMIN D 25 HYDROXY (VIT D DEFICIENCY, FRACTURES): Vit D, 25-Hydroxy: 89.01 ng/mL (ref 30–100)

## 2024-01-11 LAB — VITAMIN B12: Vitamin B-12: >7500 pg/mL — ABNORMAL HIGH (ref 180–914)

## 2024-01-11 MED ORDER — VITAMIN D 25 MCG (1000 UNIT) PO TABS
5000.0000 [IU] | ORAL_TABLET | Freq: Every day | ORAL | Status: DC
  Administered 2024-01-11 – 2024-01-14 (×4): 5000 [IU] via ORAL
  Filled 2024-01-11 (×5): qty 5

## 2024-01-11 MED ORDER — ONDANSETRON 4 MG PO TBDP
4.0000 mg | ORAL_TABLET | Freq: Three times a day (TID) | ORAL | Status: DC | PRN
  Administered 2024-01-11: 4 mg via ORAL
  Filled 2024-01-11: qty 1

## 2024-01-11 MED ORDER — FLUOXETINE HCL 20 MG PO CAPS
40.0000 mg | ORAL_CAPSULE | Freq: Every day | ORAL | Status: DC
  Administered 2024-01-12 – 2024-01-14 (×3): 40 mg via ORAL
  Filled 2024-01-11 (×3): qty 2

## 2024-01-11 MED ORDER — BUPROPION HCL ER (XL) 300 MG PO TB24
300.0000 mg | ORAL_TABLET | Freq: Every day | ORAL | Status: DC
  Administered 2024-01-11 – 2024-01-14 (×4): 300 mg via ORAL
  Filled 2024-01-11 (×4): qty 1

## 2024-01-11 MED ORDER — AMOXICILLIN-POT CLAVULANATE 500-125 MG PO TABS
1.0000 | ORAL_TABLET | Freq: Three times a day (TID) | ORAL | Status: DC
  Administered 2024-01-11 – 2024-01-14 (×10): 1 via ORAL
  Filled 2024-01-11 (×12): qty 1

## 2024-01-11 MED ORDER — PANTOPRAZOLE SODIUM 40 MG PO TBEC
40.0000 mg | DELAYED_RELEASE_TABLET | Freq: Every day | ORAL | Status: DC
Start: 2024-01-11 — End: 2024-01-14
  Administered 2024-01-11 – 2024-01-14 (×4): 40 mg via ORAL
  Filled 2024-01-11 (×4): qty 1

## 2024-01-11 MED ORDER — GABAPENTIN 400 MG PO CAPS
400.0000 mg | ORAL_CAPSULE | Freq: Two times a day (BID) | ORAL | Status: DC
Start: 2024-01-11 — End: 2024-01-14
  Administered 2024-01-11 – 2024-01-14 (×6): 400 mg via ORAL
  Filled 2024-01-11 (×6): qty 1

## 2024-01-11 MED ORDER — FERROUS SULFATE 325 (65 FE) MG PO TABS
325.0000 mg | ORAL_TABLET | Freq: Every day | ORAL | Status: DC
  Administered 2024-01-12 – 2024-01-14 (×3): 325 mg via ORAL
  Filled 2024-01-11 (×3): qty 1

## 2024-01-11 MED ORDER — VITAMIN D 25 MCG (1000 UNIT) PO TABS: 5000.0000 [IU] | ORAL_TABLET | Freq: Every day | ORAL | Status: DC

## 2024-01-11 MED ORDER — METOPROLOL SUCCINATE ER 50 MG PO TB24
50.0000 mg | ORAL_TABLET | Freq: Every day | ORAL | Status: DC
  Administered 2024-01-11 – 2024-01-14 (×4): 50 mg via ORAL
  Filled 2024-01-11 (×4): qty 1

## 2024-01-11 MED ORDER — RISPERIDONE 1 MG PO TABS
1.0000 mg | ORAL_TABLET | Freq: Every day | ORAL | Status: DC
  Administered 2024-01-11 – 2024-01-13 (×3): 1 mg via ORAL
  Filled 2024-01-11 (×3): qty 1

## 2024-01-11 MED ORDER — FERROUS SULFATE 325 (65 FE) MG PO TBEC
325.0000 mg | DELAYED_RELEASE_TABLET | Freq: Every day | ORAL | Status: DC
  Filled 2024-01-11: qty 1

## 2024-01-11 MED ORDER — BUSPIRONE HCL 10 MG PO TABS
10.0000 mg | ORAL_TABLET | Freq: Three times a day (TID) | ORAL | Status: DC
  Administered 2024-01-11 – 2024-01-14 (×10): 10 mg via ORAL
  Filled 2024-01-11 (×10): qty 1

## 2024-01-11 NOTE — Progress Notes (Signed)
   01/11/24 2300  Psych Admission Type (Psych Patients Only)  Admission Status Voluntary  Psychosocial Assessment  Patient Complaints Anxiety;Worrying  Eye Contact Fair  Facial Expression Anxious  Affect Sad;Preoccupied  Speech Logical/coherent  Interaction Assertive  Motor Activity Slow  Appearance/Hygiene Unremarkable  Behavior Characteristics Cooperative  Mood Depressed  Aggressive Behavior  Effect No apparent injury  Thought Process  Coherency WDL  Content WDL  Delusions WDL  Perception WDL  Hallucination None reported or observed  Judgment Poor  Confusion WDL  Danger to Self  Current suicidal ideation? Denies  Danger to Others  Danger to Others None reported or observed

## 2024-01-11 NOTE — [facility] Suicide Risk Assessment (Addendum)
 [facility] INPATIENT:  Family/Significant Other Suicide Prevention Education  Suicide Prevention Education:  Education Completed; Tracie James,significant other [phone]  (name of family member/significant other) has been identified by the patient as the family member/significant other with whom the patient will be residing, and identified as the person(s) who will aid the patient in the event of a mental health crisis (suicidal ideations/suicide attempt).  With written consent from the patient, the family member/significant other has been provided the following suicide prevention education, prior to the and/or following the discharge of the patient.  CSW received permission from pt to speak with her significant other regarding safety concerns. Mr. James reported,  she was crying for the past 3-4 days, all women cry so I didn't think anything of it, I have been with her for the past 14 years have not seen her act this way before, I know that she had a root canal recently and was in a lot of pain which turn into a stomach ache, I know she wanted to quit her job because she was not making quota Mr. James reported no safety concerns with pt returning home. Mr. James reported firearms in the home which are secured in the safe, pt does not have the code.   Pt will be returning home with Tracie James.  The suicide prevention education provided includes the following: Suicide risk factors Suicide prevention and interventions National Suicide Hotline telephone number [facility] assessment telephone number [facility] Emergency Assistance 911 [facility] and/or Residential Mobile Crisis Unit telephone number  Request made of family/significant other to: Remove weapons (e.g., guns, rifles, knives), all items previously/currently identified as safety concern.   Remove drugs/medications (over-the-counter, prescriptions, illicit drugs), all items previously/currently identified as a safety  concern.  The family member/significant other verbalizes understanding of the suicide prevention education information provided.  The family member/significant other agrees to remove the items of safety concern listed above.  Tracie James, Tracie James 01/11/2024, 2:03 PM

## 2024-01-11 NOTE — Progress Notes (Signed)
 [facility] MD Progress Note  01/11/2024 12:19 PM Charlet Harr  MRN:  [id number] Subjective:  Tracie James states, I slept well last night and feeling restful. Principal Problem: GAD (generalized anxiety disorder) Diagnosis: Principal Problem:   GAD (generalized anxiety disorder)  Reason for admission:  Tracie James is a 53 year old Caucasian female with prior psychiatric diagnoses significant for GAD, MDD, insomnia, and bipolar 2 disorder and past medical history of chronic pain syndrome, fibromyalgia, seizures activities, abdominal pain, obesity, vitamin D  deficiency, and palpitations.  Patient presents voluntarily to [facility]:  [facility] from [facility] ED at [facility] for depression and abdominal pain in the context of work stressors.   24 hours chart review: Patient's case with unit by the interdisciplinary team.  Signs of blood pressure 160/90 pulse 104.  Reordered Toprol  XL 50 mg po daily for hypertension.  Trazodone  for insomnia and hydroxyzine  for anxiety required.  Today's assessment notes: On assessment today, the pt reports that her mood is less depressed and rates depression as #7/10.  Patient added this is normal for me.  Affect is congruent with mood.  Chart reviewed and findings shared with the treatment team and consult with attending psychiatrist with recommendation to continue with her home medications including Toprol  XL 50 mg p.o. daily for hypertension, Wellbutrin  300 mg p.o. daily for depression, Prozac  40 mg p.o. daily for depression and anxiety, and to discontinue Abilify 5 mg p.o. daily due to history of seizures.  New order for Risperdal 1 mg p.o. nightly initiated for an increasing negative voices in my head. Reports that anxiety is at manageable level Sleep is improving Appetite is fair Concentration is better  Energy level is adequate Denies suicidal thoughts.  Further denies suicidal intent and plan.  Denies having any HI.   Denies having psychotic symptoms.   Denies having side effects to current psychiatric medications.   We discussed changes to current medication regimen, including resuming Toprol  XL 50 mg p.o. for hypertension, Wellbutrin  XL 300 mg p.o. for depression, Prozac  40 mg p.o. for depression, gabapentin  400 mg twice daily for hot flashes, Augmentin 500-125 mg opposed to the extraction, Risperdal 1 mg p.o. at bedtime for negative thought, and BuSpar  10 mg p.o. 3 times daily for anxiety.  Total Time spent with patient: 45 minutes  Past Psychiatric History: Previous Psych Diagnoses: MDD, bipolar 2 disorder, GAD, SI, and insomnia Prior inpatient treatment: Yes x 1 in [place]  Current/prior outpatient treatment: Denies Prior rehab hx: Denies Psychotherapy hx: Yes History of suicide: Yes History of homicide or aggression: Denies Psychiatric medication history: Patient has been on trial of lithium, Celebrex, Zoloft, Wellbutrin , Prozac , regular, Abilify, and Seroquel Psychiatric medication compliance history: Reports compliance Neuromodulation history: Denies Current Psychiatrist: At [facility] Current therapist: Denies  Past Medical History:  Past Medical History:  Diagnosis Date   Anxiety    Asthma    Bipolar 2 disorder (HCC)    Depression    GERD (gastroesophageal reflux disease)    Palpitations    PVC (premature ventricular contraction)     Past Surgical History:  Procedure Laterality Date   APPENDECTOMY     TOTAL ABDOMINAL HYSTERECTOMY     Family History:  Family History  Problem Relation Age of Onset   Depression Mother    Multiple sclerosis Mother    Schizophrenia Maternal Grandmother    Heart disease Maternal Grandmother        aortic valve replacement   Social History:  Social History   Substance and Sexual Activity  Alcohol Use No     Social History   Substance and Sexual Activity  Drug Use No    Social History   Socioeconomic History    Marital status: Divorced    Spouse name: Not on file   Number of children: Not on file   Years of education: Not on file   Highest education level: Not on file  Occupational History   Not on file  Tobacco Use   Smoking status: Former    Current packs/day: 0.00    Types: Cigarettes    Quit date: 03/31/2014    Years since quitting: 9.7   Smokeless tobacco: Never  Vaping Use   Vaping status: Every Day  Substance and Sexual Activity   Alcohol use: No   Drug use: No   Sexual activity: Yes    Partners: Male    Birth control/protection: Surgical  Other Topics Concern   Not on file  Social History Narrative   Not on file   Social Drivers of Health   Financial Resource Strain: Not on file  Food Insecurity: No Food Insecurity (01/09/2024)   Hunger Vital Sign    Worried About Running Out of Food in the Last Year: Never true    Ran Out of Food in the Last Year: Never true  Transportation Needs: No Transportation Needs (01/09/2024)   PRAPARE - Administrator, Civil Service (Medical): No    Lack of Transportation (Non-Medical): No  Physical Activity: Not on file  Stress: Not on file  Social Connections: Socially Integrated (01/09/2024)   Social Connection and Isolation Panel    Frequency of Communication with Friends and Family: Twice a week    Frequency of Social Gatherings with Friends and Family: Twice a week    Attends Religious Services: More than 4 times per year    Active Member of [employer] or Organizations: Patient declined    Attends Engineer, structural: 1 to 4 times per year    Marital Status: Married   Additional Social History:    Sleep: Good Estimated Sleeping Duration (Last 24 Hours): 0.75-1.00 hours  Appetite:  Fair  Current Medications: Current Facility-Administered Medications  Medication Dose Route Frequency Provider Last Rate Last Admin   acetaminophen (TYLENOL) tablet 650 mg  650 mg Oral Q6H PRN Trudy Carwin, NP       alum & mag  hydroxide-simeth (MAALOX/MYLANTA) 200-200-20 MG/5ML suspension 30 mL  30 mL Oral Q4H PRN Trudy Carwin, NP       buPROPion  (WELLBUTRIN  XL) 24 hr tablet 300 mg  300 mg Oral Daily Tykisha Areola C, FNP       busPIRone  (BUSPAR ) tablet 10 mg  10 mg Oral TID Momo Braun C, FNP       [START ON 01/12/2024] FLUoxetine  (PROZAC ) capsule 40 mg  40 mg Oral Daily Destine Zirkle C, FNP       hydrOXYzine  (ATARAX ) tablet 25 mg  25 mg Oral TID PRN Jaron Czarnecki C, FNP   25 mg at 01/10/24 2108   magnesium hydroxide (MILK OF MAGNESIA) suspension 30 mL  30 mL Oral Daily PRN Trudy Carwin, NP       metoprolol  succinate (TOPROL -XL) 24 hr tablet 50 mg  50 mg Oral Daily Renner Sebald C, FNP       OLANZapine (ZYPREXA) injection 10 mg  10 mg Intramuscular TID PRN Trudy Carwin, NP       OLANZapine (ZYPREXA) injection 5  mg  5 mg Intramuscular TID PRN Trudy Carwin, NP       OLANZapine zydis (ZYPREXA) disintegrating tablet 5 mg  5 mg Oral TID PRN Trudy Carwin, NP   5 mg at 01/09/24 2235   risperiDONE (RISPERDAL) tablet 1 mg  1 mg Oral QHS Lavaya Defreitas C, FNP       traZODone  (DESYREL ) tablet 50 mg  50 mg Oral QHS PRN Annarose Ouellet C, FNP   50 mg at 01/10/24 2108    Lab Results:  Results for orders placed or performed during the hospital encounter of 01/09/24 (from the past 48 hours)  VITAMIN D  25 Hydroxy (Vit-D Deficiency, Fractures)     Status: None   Collection Time: 01/11/24  6:35 AM  Result Value Ref Range   Vit D, 25-Hydroxy 89.01 30 - 100 ng/mL    Comment: (NOTE) Vitamin D  deficiency has been defined by the Institute of Medicine  and an Endocrine Society practice guideline as a level of serum 25-OH  vitamin D  less than 20 ng/mL (1,2). The Endocrine Society went on to  further define vitamin D  insufficiency as a level between 21 and 29  ng/mL (2).  1. IOM (Institute of Medicine). 2010. Dietary reference intakes for  calcium and D. [place]: [employer]. 2. Holick MF, Binkley Scalp Level, Bischoff-Ferrari HA, et  al. Evaluation,  treatment, and prevention of vitamin D  deficiency: an Endocrine  Society clinical practice guideline, JCEM. 2011 Jul; 96(7): 1911-30.  Performed at [facility], [address], [place], [place] [address]   Vitamin B12     Status: Abnormal   Collection Time: 01/11/24  6:35 AM  Result Value Ref Range   Vitamin B-12 >7,500 (H) 180 - 914 pg/mL    Comment: RESULT CONFIRMED BY MANUAL DILUTION (NOTE) This assay is not validated for testing neonatal or myeloproliferative syndrome specimens for Vitamin B12 levels. Performed at [facility], [address], [place], [place] [address]   TSH     Status: None   Collection Time: 01/11/24  6:35 AM  Result Value Ref Range   TSH 2.284 0.350 - 4.500 uIU/mL    Comment: Performed by a 3rd Generation assay with a functional sensitivity of <=0.01 uIU/mL. Performed at [facility], [address] [place], [place] [address]   Lipid panel     Status: Abnormal   Collection Time: 01/11/24  6:35 AM  Result Value Ref Range   Cholesterol 313 (H) 0 - 200 mg/dL   Triglycerides 841 (H) <150 mg/dL   HDL 63 >59 mg/dL   Total CHOL/HDL Ratio 5.0 RATIO   VLDL 32 0 - 40 mg/dL   LDL Cholesterol 781 (H) 0 - 99 mg/dL    Comment:        Total Cholesterol/HDL:CHD Risk Coronary Heart Disease Risk Table                     Men   Women  1/2 Average Risk   3.4   3.3  Average Risk       5.0   4.4  2 X Average Risk   9.6   7.1  3 X Average Risk  23.4   11.0        Use the calculated Patient Ratio above and the CHD Risk Table to determine the patient's CHD Risk.        ATP III CLASSIFICATION (LDL):  <100     mg/dL  Optimal  100-129  mg/dL   Near or Above                    Optimal  130-159  mg/dL   Borderline  839-810  mg/dL   High  >809     mg/dL   Very High Performed at [facility], [address], [place], [place] [address]   Hemoglobin A1c     Status: None   Collection  Time: 01/11/24  6:35 AM  Result Value Ref Range   Hgb A1c MFr Bld 5.0 4.8 - 5.6 %    Comment: (NOTE) Diagnosis of Diabetes The following HbA1c ranges recommended by the American Diabetes Association (ADA) may be used as an aid in the diagnosis of diabetes mellitus.  Hemoglobin             Suggested A1C NGSP%              Diagnosis  <5.7                   Non Diabetic  5.7-6.4                Pre-Diabetic  >6.4                   Diabetic  <7.0                   Glycemic control for                       adults with diabetes.     Mean Plasma Glucose 96.8 mg/dL    Comment: Performed at [facility], [address] [place], [place] [address]   Blood Alcohol level:  Lab Results  Component Value Date   [facility] <15 01/09/2024    Metabolic Disorder Labs: Lab Results  Component Value Date   HGBA1C 5.0 01/11/2024   MPG 96.8 01/11/2024   No results found for: PROLACTIN Lab Results  Component Value Date   CHOL 313 (H) 01/11/2024   TRIG 158 (H) 01/11/2024   HDL 63 01/11/2024   CHOLHDL 5.0 01/11/2024   VLDL 32 01/11/2024   LDLCALC 218 (H) 01/11/2024   LDLCALC 110 (H) 04/25/2023   Physical Findings: AIMS:  ,  ,  ,  ,  ,  ,   CIWA:    COWS:     Musculoskeletal: Strength & Muscle Tone: within normal limits Gait & Station: normal Patient leans: N/A  Psychiatric Specialty Exam:  Presentation  General Appearance:  Appropriate for Environment; Casual; Fairly Groomed  Eye Contact: Good  Speech: Clear and Coherent; Normal Rate  Speech Volume: Normal  Handedness: Right  Mood and Affect  Mood: Anxious; Depressed  Affect: Congruent  Thought Process  Thought Processes: Coherent; Goal Directed  Descriptions of Associations:Intact  Orientation:Full (Time, Place and Person)  Thought Content:Logical  History of Schizophrenia/Schizoaffective disorder:No data recorded Duration of Psychotic Symptoms:No data recorded Hallucinations:Hallucinations:  Auditory Description of Auditory Hallucinations: Hearing my own voices in my head that will not shut off, and they are negative voices.  Ideas of Reference:None  Suicidal Thoughts:Suicidal Thoughts: No SI Passive Intent and/or Plan: -- (Denies)  Homicidal Thoughts:Homicidal Thoughts: No  Sensorium  Memory: Immediate Good; Recent Good  Judgment: Fair  Insight: Fair  Art therapist  Concentration: Fair  Attention Span: Fair  Recall: Good  Fund of Knowledge: Good  Language: Good  Psychomotor Activity  Psychomotor Activity:  Psychomotor Activity: Normal  Assets  Assets: Communication Skills; Desire for Improvement; Physical Health; Resilience; Social Support  Sleep  Sleep: Sleep: Good Number of Hours of Sleep: 9  Physical Exam: Physical Exam Vitals and nursing note reviewed.  Constitutional:      General: She is not in acute distress.    Appearance: She is not ill-appearing.  HENT:     Head: Normocephalic.     Right Ear: External ear normal.     Left Ear: External ear normal.     Nose: Nose normal.     Mouth/Throat:     Mouth: Mucous membranes are moist.   Eyes:     Extraocular Movements: Extraocular movements intact.    Cardiovascular:     Rate and Rhythm: Normal rate.     Pulses: Normal pulses.  Pulmonary:     Effort: Pulmonary effort is normal.  Abdominal:     Comments: Deferred  Genitourinary:    Comments: Deferred  Musculoskeletal:        General: Normal range of motion.   Skin:    General: Skin is warm.   Neurological:     General: No focal deficit present.     Mental Status: She is alert and oriented to person, place, and time.   Psychiatric:        Mood and Affect: Mood normal.        Behavior: Behavior normal.        Thought Content: Thought content normal.   Review of Systems  Constitutional:  Negative for chills and fever.  HENT:  Negative for sore throat.   Eyes:  Negative for blurred vision.  Respiratory:   Negative for cough, sputum production, shortness of breath and wheezing.   Cardiovascular:  Negative for chest pain and palpitations.  Gastrointestinal:  Negative for heartburn and nausea.  Genitourinary:  Negative for dysuria and urgency.  Musculoskeletal:  Negative for falls.  Skin:  Negative for itching and rash.  Neurological:  Negative for dizziness and headaches.  Endo/Heme/Allergies:        See allergy listing  Psychiatric/Behavioral:  Positive for depression. Negative for hallucinations, substance abuse and suicidal ideas. The patient is nervous/anxious. The patient does not have insomnia.    Blood pressure (!) 143/81, pulse (!) 106, temperature 98.4 F (36.9 C), temperature source Oral, resp. rate 16, height 5' 4 (1.626 m), weight 90.7 kg, SpO2 100%. Body mass index is 34.33 kg/m.   Treatment Plan Summary: Daily contact with patient to assess and evaluate symptoms and progress in treatment and Medication management Physician Treatment Plan for Primary Diagnosis:  Assessment::  Laquanta Stephenson is a 50 year old Caucasian female with prior psychiatric diagnoses significant for GAD, MDD, insomnia, and bipolar 2 disorder and past medical history of chronic pain syndrome, fibromyalgia, seizures activities, abdominal pain, obesity, vitamin D  deficiency, and palpitations.  Patient presents voluntarily to [facility]: [facility] from [place]: ED at [facility] depression and abdominal pain in the context of work stressors.    GAD (generalized anxiety disorder)   Plans: Medications: -- Discontinue Zyprexa 5 mg p.o. daily for psychosis -- Discontinue Abilify  5 mg p.o. daily for mood disorder and augmentation of antidepressant -- Increase Prozac  tablets from 20 mg p.o. to 40 mg p.o. daily for depression and anxiety -- Continue hydroxyzine  tablets  25 mg p.o. 3 times daily as needed for anxiety -- Continue trazodone  tablet 50 mg p.o. as needed at bedtime for  insomnia -- Continue BuSpar  tablets 10  mg p.o. 3 times daily for anxiety -- Initiate Risperdal 1 mg p.o. daily q. for for auditory hallucinations     Medication for other medical problems:: --Continue Toprol  XL 50 mg 24-hour tablets p.o. daily for high blood pressure -- Continue iron  sulfate 325 mg mg p.o. daily for anemia -- Protonix 40 mg p.o. tablet for GERD --Continue vitamin D  colecalciferol 1000 units p.o. daily for vitamin D  deficiency --Gabapentin  400 mg p.o. daily at bedtime for nerve pain --Continue Augmentin   tablet  500-125 mg tablets   p.o. 3 times daily x 5 days --Ondansetron  4 mg p.o. every 8 hours as needed for nausea or vomiting   Other PRN Medications  -Acetaminophen 650 mg every 6 as needed/mild pain  -Maalox 30 mL oral every 4 as needed/digestion  -Magnesium hydroxide 30 mL daily as needed/mild constipation    --The risks/benefits/side-effects/alternatives to this medication were discussed in detail with the patient and time was given for questions. The patient consents to medication trial.   -- Metabolic profile and EKG monitoring obtained while on an atypical antipsychotic (BMI: Lipid Panel: HbgA1c: QTc:)   - Encouraged patient to participate in unit milieu and in scheduled group therapies    Continue BH Agitation Protocol  -Haldol 5 mg, oral, 3 times daily as needed, mild agitation  --Benadryl 50 mg, oral, 3 times daily as needed, mild agitation                                      OR   --Haldol injection 5 mg, IM, 3 times daily as needed, moderate agitation  --Benadryl injection 50 mg, IM, 3 times daily as needed, moderate agitation  --Ativan  injection 2 mg, IM, 3 times daily as needed, moderate agitation                                      OR  --Haldol injection 10 mg, IM, 3 times daily as needed, severe agitation  --Benadryl injection 50 mg, IM, 3 times daily as needed, severe agitation  --Ativan  injection 2 mg, IM, 3 times daily as needed, severe  agitation    Admission labs reviewed:: CMP: Glucose 117, creatinine 1.08, AST 111, ALT 152, otherwise normal.  CBC with differential WBC 11.4, RBC 5.24.  Otherwise normal.  Pregnancy test negative .  BAL less than 15.  UDS: Positive for marijuana   New labs ordered:: Vitamin D  25-hydroxy, vitamin B-12, lipid panel, TSH, hemoglobin A1c.   EKG reviewed:: Sinus tachycardia with fusion complexes, ventricular rate 125, QT/QTc 304/4088.   Safety and Monitoring:  Voluntary admission to inpatient psychiatric unit for safety, stabilization and treatment  Daily contact with patient to assess and evaluate symptoms and progress in treatment  Patient's case to be discussed in multi-disciplinary team meeting  Observation Level : q15 minute checks  Vital signs: q12 hours  Precautions: suicide, but pt currently verbally contracts for safety on unit?    Discharge Planning:  Social work and case management to assist with discharge planning and identification of hospital follow-up needs prior to discharge  Estimated LOS: 5-7?days  Discharge Concerns: Need to establish a safety plan; Medication compliance and effectiveness  Discharge Goals: Return home with outpatient referrals for mental health follow-up including medication management/psychotherapy.    Long Term Goal(s): Improvement in symptoms so as ready for  discharge   Short Term Goals: Ability to identify changes in lifestyle to reduce recurrence of condition will improve, Ability to verbalize feelings will improve, Ability to disclose and discuss suicidal ideas, Ability to demonstrate self-control will improve, Ability to identify and develop effective coping behaviors will improve, Ability to maintain clinical measurements within normal limits will improve, Compliance with prescribed medications will improve, and Ability to identify triggers associated with substance abuse/mental health issues will improve   Physician Treatment Plan for Secondary  Diagnosis: Principal Problem: GAD (generalized anxiety disorder) MDD Bipolar 2 disorder Suicidal ideation Insomnia   I certify that inpatient services furnished can reasonably be expected to improve the patient's condition.     Ellouise JAYSON Azure, FNP 01/11/2024, 12:19 PM

## 2024-01-11 NOTE — Progress Notes (Addendum)
 Patient denies SI/HI/AVH this morning. Pt rates her depression a 8/10 and anxiety a 10/10. Pt reports that she slept good last night. Pt has been interactive on the unit and participating in groups throughout the day. Pt has been calm and cooperative throughout the day. Patient has been compliant with medications and treatment plan. Q 15 minute safety checks are in place for patient's safety. Patient is currently safe on the unit.   01/11/24 0844  Psych Admission Type (Psych Patients Only)  Admission Status Other (Comment) (q 15 minute safety checks)  Psychosocial Assessment  Patient Complaints Anxiety;Depression  Eye Contact Fair  Facial Expression Animated  Affect Anxious;Sad  Speech Logical/coherent  Interaction Assertive  Motor Activity Fidgety  Appearance/Hygiene Unremarkable  Behavior Characteristics Cooperative  Mood Depressed;Anxious  Thought Process  Coherency WDL  Content WDL  Delusions None reported or observed  Perception WDL  Hallucination None reported or observed  Judgment Impaired  Confusion None  Danger to Self  Current suicidal ideation? Denies  Description of Suicide Plan No plan  Agreement Not to Harm Self Yes  Description of Agreement verbal  Danger to Others  Danger to Others None reported or observed  Danger to Others Abnormal  Harmful Behavior to others No threats or harm toward other people  Destructive Behavior No threats or harm toward property

## 2024-01-11 NOTE — BH IP Treatment Plan (Signed)
 Interdisciplinary Treatment and Diagnostic Plan Update  01/11/2024 Time of Session: 1010 Tracie James MRN: [id number]  Principal Diagnosis: GAD (generalized anxiety disorder)  Secondary Diagnoses: Principal Problem:   GAD (generalized anxiety disorder)   Current Medications:  Current Facility-Administered Medications  Medication Dose Route Frequency Provider Last Rate Last Admin   acetaminophen (TYLENOL) tablet 650 mg  650 mg Oral Q6H PRN Trudy Carwin, NP       alum & mag hydroxide-simeth (MAALOX/MYLANTA) 200-200-20 MG/5ML suspension 30 mL  30 mL Oral Q4H PRN Trudy Carwin, NP       ARIPiprazole (ABILIFY) tablet 5 mg  5 mg Oral Daily Ntuen, Tina C, FNP   5 mg at 01/11/24 0750   FLUoxetine  (PROZAC ) capsule 20 mg  20 mg Oral Daily Ntuen, Tina C, FNP   20 mg at 01/11/24 0750   hydrOXYzine  (ATARAX ) tablet 25 mg  25 mg Oral TID PRN Ntuen, Tina C, FNP   25 mg at 01/10/24 2108   magnesium hydroxide (MILK OF MAGNESIA) suspension 30 mL  30 mL Oral Daily PRN Trudy Carwin, NP       nicotine (NICODERM CQ - dosed in mg/24 hours) patch 21 mg  21 mg Transdermal Daily Ntuen, Tina C, FNP       nicotine polacrilex (NICORETTE) gum 2 mg  2 mg Oral PRN Ntuen, Tina C, FNP       OLANZapine (ZYPREXA) injection 10 mg  10 mg Intramuscular TID PRN Trudy Carwin, NP       OLANZapine (ZYPREXA) injection 5 mg  5 mg Intramuscular TID PRN Trudy Carwin, NP       OLANZapine zydis (ZYPREXA) disintegrating tablet 5 mg  5 mg Oral TID PRN Trudy Carwin, NP   5 mg at 01/09/24 2235   traZODone  (DESYREL ) tablet 50 mg  50 mg Oral QHS PRN Ntuen, Tina C, FNP   50 mg at 01/10/24 2108   PTA Medications: Medications Prior to Admission  Medication Sig Dispense Refill Last Dose/Taking   amoxicillin-clavulanate (AUGMENTIN) 500-125 MG tablet Take 1 tablet by mouth 3 (three) times daily.   Taking   buPROPion  (WELLBUTRIN  XL) 150 MG 24 hr tablet Take 1 tablet (150 mg total) by mouth daily. Take total of 450 mg daily. Along with 300  mg tab (Patient taking differently: Take 150 mg by mouth at bedtime. Take total of 450 mg daily. Along with 300 mg tab) 90 tablet 1 Past Week   buPROPion  (WELLBUTRIN  XL) 300 MG 24 hr tablet Take 1 tablet (300 mg total) by mouth daily. TAKE 1 TABLET DAILY AND 150 MG DAILY (TOTAL OF 450 MG) (Patient taking differently: Take 300 mg by mouth at bedtime. TAKE 1 TABLET DAILY AND 150 MG DAILY (TOTAL OF 450 MG)) 90 tablet 1 Past Week   [START ON 01/25/2024] busPIRone  (BUSPAR ) 10 MG tablet Take 1 tablet (10 mg total) by mouth 3 (three) times daily. 270 tablet 0 Past Week   ferrous sulfate (SLOW IRON ) 160 (50 Fe) MG TBCR SR tablet Take 1 tablet (160 mg total) by mouth daily. (Patient taking differently: Take 1 tablet by mouth at bedtime.) 90 tablet 1 Past Week   FLUoxetine  (PROZAC ) 40 MG capsule Take 2 capsules (80 mg total) by mouth daily. (Patient taking differently: Take 80 mg by mouth at bedtime.) 180 capsule 1 Past Week   gabapentin  (NEURONTIN ) 400 MG tablet Take 1 tablet (400 mg total) by mouth at bedtime. 30 tablet 11 Past Week   LORazepam  (ATIVAN ) 1 MG tablet Take  1 mg by mouth daily as needed for anxiety.   Past Week   metoprolol  succinate (TOPROL -XL) 50 MG 24 hr tablet Take 1 tablet (50 mg total) by mouth daily. Take with or immediately following a meal.Take with or immediately following a meal (Patient taking differently: Take 50 mg by mouth at bedtime. Take with or immediately following a meal.Take with or immediately following a meal) 90 tablet 3 Past Week   Multiple Vitamin (MULTIVITAMIN WITH MINERALS) TABS tablet Take 1 tablet by mouth at bedtime.   Past Week   naproxen  (NAPROSYN ) 500 MG tablet TAKE 1 TABLET BY MOUTH TWICE A DAY WITH FOOD (Patient taking differently: Take 1,000 mg by mouth at bedtime.) 180 tablet 3 Past Week   pantoprazole (PROTONIX) 40 MG tablet Take 1 tablet (40 mg total) by mouth daily for 14 days. (Patient taking differently: Take 40 mg by mouth at bedtime.) 14 tablet 0 Past Week    traZODone  (DESYREL ) 50 MG tablet Take 0.5-1 tablets (25-50 mg total) by mouth at bedtime. 90 tablet 0 Past Week   Vitamin D , Cholecalciferol, 1000 UNITS TABS Take 5,000 Units by mouth at bedtime.   Past Week    Patient Stressors: Occupational concerns   Other: SI    Patient Strengths: Supportive family/friends  Work skills   Treatment Modalities: Medication Management, Group therapy, Case management,  1 to 1 session with clinician, Psychoeducation, Recreational therapy.   Physician Treatment Plan for Primary Diagnosis: GAD (generalized anxiety disorder) Long Term Goal(s): Improvement in symptoms so as ready for discharge   Short Term Goals: Ability to identify changes in lifestyle to reduce recurrence of condition will improve Ability to verbalize feelings will improve Ability to disclose and discuss suicidal ideas Ability to demonstrate self-control will improve Ability to identify and develop effective coping behaviors will improve Ability to maintain clinical measurements within normal limits will improve Compliance with prescribed medications will improve Ability to identify triggers associated with substance abuse/mental health issues will improve  Medication Management: Evaluate patient's response, side effects, and tolerance of medication regimen.  Therapeutic Interventions: 1 to 1 sessions, Unit Group sessions and Medication administration.  Evaluation of Outcomes: Not Progressing  Physician Treatment Plan for Secondary Diagnosis: Principal Problem:   GAD (generalized anxiety disorder)  Long Term Goal(s): Improvement in symptoms so as ready for discharge   Short Term Goals: Ability to identify changes in lifestyle to reduce recurrence of condition will improve Ability to verbalize feelings will improve Ability to disclose and discuss suicidal ideas Ability to demonstrate self-control will improve Ability to identify and develop effective coping behaviors will  improve Ability to maintain clinical measurements within normal limits will improve Compliance with prescribed medications will improve Ability to identify triggers associated with substance abuse/mental health issues will improve     Medication Management: Evaluate patient's response, side effects, and tolerance of medication regimen.  Therapeutic Interventions: 1 to 1 sessions, Unit Group sessions and Medication administration.  Evaluation of Outcomes: Not Progressing   RN Treatment Plan for Primary Diagnosis: GAD (generalized anxiety disorder) Long Term Goal(s): Knowledge of disease and therapeutic regimen to maintain health will improve  Short Term Goals: Ability to remain free from injury will improve, Ability to verbalize frustration and anger appropriately will improve, Ability to demonstrate self-control, Ability to participate in decision making will improve, Ability to verbalize feelings will improve, Ability to disclose and discuss suicidal ideas, Ability to identify and develop effective coping behaviors will improve, and Compliance with prescribed medications will improve  Medication Management: RN will administer medications as ordered by provider, will assess and evaluate patient's response and provide education to patient for prescribed medication. RN will report any adverse and/or side effects to prescribing provider.  Therapeutic Interventions: 1 on 1 counseling sessions, Psychoeducation, Medication administration, Evaluate responses to treatment, Monitor vital signs and CBGs as ordered, Perform/monitor CIWA, COWS, AIMS and Fall Risk screenings as ordered, Perform wound care treatments as ordered.  Evaluation of Outcomes: Not Progressing   LCSW Treatment Plan for Primary Diagnosis: GAD (generalized anxiety disorder) Long Term Goal(s): Safe transition to appropriate next level of care at discharge, Engage patient in therapeutic group addressing interpersonal  concerns.  Short Term Goals: Engage patient in aftercare planning with referrals and resources, Increase social support, Increase ability to appropriately verbalize feelings, Increase emotional regulation, Facilitate acceptance of mental health diagnosis and concerns, Facilitate patient progression through stages of change regarding substance use diagnoses and concerns, Identify triggers associated with mental health/substance abuse issues, and Increase skills for wellness and recovery  Therapeutic Interventions: Assess for all discharge needs, 1 to 1 time with Social worker, Explore available resources and support systems, Assess for adequacy in community support network, Educate family and significant other(s) on suicide prevention, Complete Psychosocial Assessment, Interpersonal group therapy.  Evaluation of Outcomes: Not Progressing   Progress in Treatment: Attending groups: No. Participating in groups: No. Taking medication as prescribed: Yes. Toleration medication: Yes. Family/Significant other contact made: No, will contact:  consents pending Patient understands diagnosis: Yes. Discussing patient identified problems/goals with staff: Yes. Medical problems stabilized or resolved: Yes. Denies suicidal/homicidal ideation: Yes. Issues/concerns per patient self-inventory: No.   New problem(s) identified: Yes, Describe:  restart home meds while she is in the hospital  New Short Term/Long Term Goal(s): stabilization, elimination of SI thoughts, development of comprehensive mental wellness plan.    Patient Goals:  Stop being so scared to go to work, I am so afraid I am going to make a mistake. Lower my anxiety and make the bad thoughts stop  Discharge Plan or Barriers: Patient recently admitted. CSW will continue to follow and assess for appropriate referrals and possible discharge planning.    Reason for Continuation of Hospitalization: Anxiety Medication stabilization  Estimated  Length of Stay: 3-5 days  Last 3 [place] Suicide Severity Risk Score: Flowsheet Row [facility] (Current) [facility] Most recent reading at 01/09/2024 10:29 PM ED from 01/09/2024 in [facility] at [facility] Most recent reading at 01/09/2024 10:40 AM Office Visit from 08/31/2021 in confidential department Most recent reading at 08/31/2021  9:13 AM  C-SSRS RISK CATEGORY Moderate Risk Moderate Risk Low Risk    Last PHQ 2/9 Scores:    11/04/2023    1:55 PM 08/12/2023    2:32 PM 06/23/2023    8:40 AM  Depression screen PHQ 2/9  Decreased Interest 1 2 2   Down, Depressed, Hopeless 1 1 1   PHQ - 2 Score 2 3 3   Altered sleeping 2 0 2  Tired, decreased energy 3 2 2   Change in appetite 2 0 2  Feeling bad or failure about yourself  2 2 3   Trouble concentrating 3 1 0  Moving slowly or fidgety/restless 3 2 0  Suicidal thoughts 1 1 1   PHQ-9 Score 18 11 13   Difficult doing work/chores Somewhat difficult Somewhat difficult Somewhat difficult    Scribe for Treatment Team: Jenkins LULLA Primer, LCSWA 01/11/2024 11:14 AM

## 2024-01-11 NOTE — BH Assessment (Signed)
 Pt was standing at end of hall looking out door window at beginning of shift. Pt encouraged to attend wrap-up group. Pt was very anxious standing with arms crossed across body, asking what group was about and where it was, stateing Im ready to get out of here. Pt became tearful. Expllained to patient regarding group and encouraged to to attend to help her get her mind off of whatever was making her sad. Pt agreed and was given her prozac  and abilify. When pt came to window for night meds she was more settled and calm. Verbally contracted for safety. 15 min checks maintained.

## 2024-01-11 NOTE — Plan of Care (Signed)
   Problem: Education: Goal: Emotional status will improve Outcome: Progressing Goal: Mental status will improve Outcome: Progressing

## 2024-01-11 NOTE — Group Note (Signed)
 Date:  01/11/2024 Time:  10:36 AM  Group Topic/Focus:  Goals Group:   The focus of this group is to help patients establish daily goals to achieve during treatment and discuss how the patient can incorporate goal setting into their daily lives to aide in recovery.    Participation Level:  Did Not Attend  Participation Quality:    Affect:    Cognitive:    Insight:   Engagement in Group:    Modes of Intervention:    Additional Comments:  Pt were aware of group time. Pt refused to attend group.  Cassius LOISE Dawn 01/11/2024, 10:36 AM

## 2024-01-11 NOTE — Group Note (Signed)
 Date:  01/11/2024 Time:  9:06 PM  Group Topic/Focus:  Narcotics Anonymous (NA) Meeting    Participation Level:  Did Not Attend  Participation Quality:  N/A  Affect:  N/A  Cognitive:  N/A  Insight: None  Engagement in Group:  N/A  Modes of Intervention:  N/A  Additional Comments:  Patient did not  Tracie James 01/11/2024, 9:06 PM

## 2024-01-11 NOTE — Group Note (Unsigned)
 Date:  01/11/2024 Time:  9:29 AM  Group Topic/Focus:  Goals Group:   The focus of this group is to help patients establish daily goals to achieve during treatment and discuss how the patient can incorporate goal setting into their daily lives to aide in recovery.     Participation Level:  {BHH PARTICIPATION OZCZO:77735}  Participation Quality:  {BHH PARTICIPATION QUALITY:22265}  Affect:  {BHH AFFECT:22266}  Cognitive:  {BHH COGNITIVE:22267}  Insight: {BHH Insight2:20797}  Engagement in Group:  {BHH ENGAGEMENT IN HMNLE:77731}  Modes of Intervention:  {BHH MODES OF INTERVENTION:22269}  Additional Comments:  ***  Cassius LOISE Dawn 01/11/2024, 9:29 AM

## 2024-01-11 NOTE — Plan of Care (Signed)
   Problem: Education: Goal: Knowledge of [place] [facility] Education information/materials will improve Outcome: Progressing Goal: Emotional status will improve Outcome: Progressing Goal: Mental status will improve Outcome: Progressing

## 2024-01-11 NOTE — BHH Counselor (Signed)
 Adult Comprehensive Assessment  Patient ID: Abbye Arboleda, female   DOB: Nov 04, 1970, 53 y.o.   MRN: [id number]  Information Source: Information source: Patient (PSA completed with pt)  Current Stressors:  Patient states their primary concerns and needs for treatment are::  I have been having panic attacks, I am scientist at [employer] Patient states their goals for this hospitilization and ongoing recovery are::  I want to get better Educational / Learning stressors: None reported Employment / Job issues: None reported Family Relationships: None reported Surveyor, quantity / Lack of resources (include bankruptcy): None reported Housing / Lack of housing: None reported Physical health (include injuries & life threatening diseases):  yes, fibromylagia and hot flashes Social relationships: None reported Substance abuse: None reported Bereavement / Loss: None reported  Living/Environment/Situation:  Living Arrangements: Spouse/significant other Living conditions (as described by patient or guardian):  I live with my boyfriend of 14 yrs Who else lives in the home?:  me and my boyfriend How long has patient lived in current situation?:  14 yrs What is atmosphere in current home: Loving, Comfortable, Supportive  Family History:  Marital status: Single Are you sexually active?: Yes What is your sexual orientation?: Heterosexual  Has your sexual activity been affected by drugs, alcohol, medication, or emotional stress?: None reported Does patient have children?: Yes How many children?: 2 How is patient's relationship with their children?: The patient notes she has 2 girls. The patient notes,  Pretty good relationship currently with the girls.  Childhood History:  By whom was/is the patient raised?: Mother Additional childhood history information: Spent summers with father Description of patient's relationship with caregiver when they were a child: Mother: typical relationship with  mother,   Father:  Good relationship How were you disciplined when you got in trouble as a child/adolescent?: Spanked  Does patient have siblings?: Yes Number of Siblings: 1 Description of patient's current relationship with siblings: The patient notes she has a younger brother. The patient notes,  We are not speaking. 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[employer], [employer] How Long has Patient Been Employed?: 29yrs 6months Are You Satisfied With Your Job?: No Do You Work More Than One Job?: No Work Stressors:  I have a strained relationship with my supervisor Patient's Job has Been Impacted by Current Illness: No What is the Longest Time Patient has Held a Job?: 14 years Where was the Patient Employed at that Time?: [employer]:   Does patient have a Lawyer or guardian?: No  Alcohol/Substance Abuse:   What has been your use of drugs/alcohol within the last 12 months?: None If attempted suicide, did drugs/alcohol play a role in this?: No Alcohol/Substance Abuse Treatment Hx: Denies past history If yes, describe treatment: none Has alcohol/substance abuse ever caused legal problems?: No  Social Support System:   Patient's Community Support System: Good Describe Community Support System:  her boyfriend and psychiatry Type of faith/religion: None How does patient's faith help to cope with  current illness?:  None reported  Leisure/Recreation:   Do You Have Hobbies?: Yes Leisure and Hobbies: scrapbooking, and making cards  Strengths/Needs:   What is the patient's perception of their strengths?:  I am not sure, but I think I am good with people Patient states these barriers may affect/interfere with their treatment:  no barriers Patient states these barriers may affect their return to the community: None reported Other important information patient would like considered in planning for their treatment: none reported  Discharge Plan:   Currently receiving community mental health services: Yes (From Whom) ( I see a psychiarist) Patient states concerns and preferences for aftercare planning are:  OPT and continue with med mgmt Patient states they will know when they are safe and ready for discharge when:  I don't know, but I think I my thoughts are more quiet Does patient have access to transportation?: Yes Does patient have financial barriers related to discharge medications?: No Patient description of barriers related to discharge medications:  no barriers Will patient be returning to same living situation after discharge?: Yes  Summary/Recommendations:   Summary and Recommendations (to be completed by the evaluator): Messiah is a 53 year old female voluntarily admitted to [facility] after presenting to [facility] due to worsening depression  and dental pain. Pt reported stressors conflict on her job and physical health. Pt denies SI/HI/AVH. Pt reported she does not use substances. Pt reported that she is being followed by Dr. Hisada for medication management and requesting a referral to [facility] for outpatient therapy. Patient will benefit from crisis stabilization, medication evaluation, group therapy and psychoeducation, in addition to case management for discharge planning. At discharge it is recommended that Patient adhere to the established discharge plan and  continue in treatment.  Luella Gardenhire R. 01/11/2024

## 2024-01-11 NOTE — Plan of Care (Signed)
   Problem: Education: Goal: Emotional status will improve Outcome: Progressing Goal: Mental status will improve Outcome: Progressing   Problem: Activity: Goal: Interest or engagement in activities will improve Outcome: Progressing Goal: Sleeping patterns will improve Outcome: Progressing

## 2024-01-11 NOTE — Group Note (Signed)
 Recreation Therapy Group Note   Group Topic:Communication  Group Date: 01/11/2024 Start Time: 9065 End Time: 1008 Facilitators: Kyan Giannone-McCall, LRT,CTRS Location: 300 [address] Dayroom   Group Topic: Communication, Problem Solving   Goal Area(s) Addresses:  Patient will effectively listen to complete activity.  Patient will identify communication skills used to make activity successful.  Patient will identify how skills used during activity can be used to reach post d/c goals.    Behavioral Response:    Intervention: Building surveyor Activity - Geometric pattern cards, pencils, blank paper    Activity: Geometric Drawings.  Three volunteers from the peer group will be shown an abstract picture with a particular arrangement of geometrical shapes.  Each round, one 'speaker' will describe the pattern, as accurately as possible without revealing the image to the group.  The remaining group members will listen and draw the picture to reflect how it is described to them. Patients with the role of 'listener' cannot ask clarifying questions but, may request that the speaker repeat a direction. Once the drawings are complete, the presenter will show the rest of the group the picture and compare how close each person came to drawing the picture. LRT will facilitate a post-activity discussion regarding effective communication and the importance of planning, listening, and asking for clarification in daily interactions with others.  Education: Environmental consultant, Active listening, Support systems, Discharge planning  Education Outcome: Acknowledges understanding/In group clarification offered/Needs additional education.    Affect/Mood: N/A   Participation Level: Did not attend    Clinical Observations/Individualized Feedback:     Plan: Continue to engage patient in RT group sessions 2-3x/week.   Tracie James, LRT,CTRS 01/11/2024 12:36 PM

## 2024-01-12 DIAGNOSIS — F411 Generalized anxiety disorder: Secondary | ICD-10-CM | POA: Diagnosis not present

## 2024-01-12 NOTE — Plan of Care (Signed)
 Nurse discussed anxiety, depression and coping skills with patient.

## 2024-01-12 NOTE — Progress Notes (Signed)
 [facility] MD Progress Note  01/12/2024 1:44 PM Ashaki Frosch  MRN:  [id number] Subjective:  [name] states, I slept well last night and feeling restful. Principal Problem: GAD (generalized anxiety disorder) Diagnosis: Principal Problem:   GAD (generalized anxiety disorder)  Reason for admission:  Tracie James is a 53 year old Caucasian female with prior psychiatric diagnoses significant for GAD, MDD, insomnia, and bipolar 2 disorder and past medical history of chronic pain syndrome, fibromyalgia, seizures activities, abdominal pain, obesity, vitamin D  deficiency, and palpitations.  Patient presents voluntarily to [facility] from [facility] ED at [facility] for depression and abdominal pain in the context of work stressors.   24 hours chart review: Patient's case with unit by the interdisciplinary team.  Signs of blood pressure 149/79 pulse 80. Trazodone  for insomnia, Zofran  for nausea, and hydroxyzine  for anxiety required.  Today's assessment notes: [name] is seen and examined during rounds. She reports that her mood is less depressed and rates depression and anxiety as #0/10, with 10 being high severity.  Affect is congruent with mood. Chart reviewed and findings shared with the treatment team and consult with attending psychiatrist.  Continues on her psychotropic medications without complaint of side effects.  No EPS, cogwheel rigidity, tardive dyskinesia observed during this assessment.  Patient denies SI, HI, or hearing negative voices in my head.  Reports Risperdal started yesterday is effective with her auditory hallucination.  Vitamin B12 with results of 7500 and elevated liver enzymes and abnormal lipid panel.  CMP to be repeated tomorrow with lipid panel for follow-up.  We moved Reports that anxiety is at manageable level Sleep is improving Appetite is fair Concentration is better  Energy level is adequate Denies suicidal  thoughts.  Further denies suicidal intent and plan.  Denies having any HI.  Denies having psychotic symptoms.   Denies having side effects to current psychiatric medications.   We discussed compliance to current medication regimen.  Total Time spent with patient: 45 minutes  Past Psychiatric History: Previous Psych Diagnoses: MDD, bipolar 2 disorder, GAD, SI, and insomnia Prior inpatient treatment: Yes x 1 in [place]  Current/prior outpatient treatment: Denies Prior rehab hx: Denies Psychotherapy hx: Yes History of suicide: Yes History of homicide or aggression: Denies Psychiatric medication history: Patient has been on trial of lithium, Celebrex, Zoloft, Wellbutrin , Prozac , regular, Abilify, and Seroquel Psychiatric medication compliance history: Reports compliance Neuromodulation history: Denies Current Psychiatrist: At [facility] Current therapist: Denies  Past Medical History:  Past Medical History:  Diagnosis Date   Anxiety    Asthma    Bipolar 2 disorder (HCC)    Depression    GERD (gastroesophageal reflux disease)    Palpitations    PVC (premature ventricular contraction)     Past Surgical History:  Procedure Laterality Date   APPENDECTOMY     TOTAL ABDOMINAL HYSTERECTOMY     Family History:  Family History  Problem Relation Age of Onset   Depression Mother    Multiple sclerosis Mother    Schizophrenia Maternal Grandmother    Heart disease Maternal Grandmother        aortic valve replacement   Social History:  Social History   Substance and Sexual Activity  Alcohol Use No     Social History   Substance and Sexual Activity  Drug Use No    Social History   Socioeconomic History   Marital status: Divorced    Spouse name: Not on file   Number of  children: Not on file   Years of education: Not on file   Highest education level: Not on file  Occupational History   Not on file  Tobacco Use   Smoking status: Former    Current  packs/day: 0.00    Types: Cigarettes    Quit date: 03/31/2014    Years since quitting: 9.7   Smokeless tobacco: Never  Vaping Use   Vaping status: Every Day  Substance and Sexual Activity   Alcohol use: No   Drug use: No   Sexual activity: Yes    Partners: Male    Birth control/protection: Surgical  Other Topics Concern   Not on file  Social History Narrative   Not on file   Social Drivers of Health   Financial Resource Strain: Not on file  Food Insecurity: No Food Insecurity (01/09/2024)   Hunger Vital Sign    Worried About Running Out of Food in the Last Year: Never true    Ran Out of Food in the Last Year: Never true  Transportation Needs: No Transportation Needs (01/09/2024)   PRAPARE - Administrator, Civil Service (Medical): No    Lack of Transportation (Non-Medical): No  Physical Activity: Not on file  Stress: Not on file  Social Connections: Socially Integrated (01/09/2024)   Social Connection and Isolation Panel    Frequency of Communication with Friends and Family: Twice a week    Frequency of Social Gatherings with Friends and Family: Twice a week    Attends Religious Services: More than 4 times per year    Active Member of [employer] or Organizations: Patient declined    Attends Engineer, structural: 1 to 4 times per year    Marital Status: Married   Additional Social History:    Sleep: Good Estimated Sleeping Duration (Last 24 Hours): 6.50-7.50 hours  Appetite:  Fair  Current Medications: Current Facility-Administered Medications  Medication Dose Route Frequency Provider Last Rate Last Admin   acetaminophen (TYLENOL) tablet 650 mg  650 mg Oral Q6H PRN Trudy Carwin, NP       alum & mag hydroxide-simeth (MAALOX/MYLANTA) 200-200-20 MG/5ML suspension 30 mL  30 mL Oral Q4H PRN Trudy Carwin, NP       amoxicillin-clavulanate (AUGMENTIN) 500-125 MG per tablet 1 tablet  1 tablet Oral TID Dakoda Laventure C, FNP   1 tablet at 01/12/24 1253   buPROPion   (WELLBUTRIN  XL) 24 hr tablet 300 mg  300 mg Oral Daily Ramanda Paules C, FNP   300 mg at 01/12/24 9248   busPIRone  (BUSPAR ) tablet 10 mg  10 mg Oral TID Tesneem Dufrane C, FNP   10 mg at 01/12/24 1253   cholecalciferol (VITAMIN D3) 25 MCG (1000 UNIT) tablet 5,000 Units  5,000 Units Oral Daily Madalen Gavin C, FNP   5,000 Units at 01/12/24 0751   ferrous sulfate tablet 325 mg  325 mg Oral Q breakfast Zouev, Dmitri, MD   325 mg at 01/12/24 0753   FLUoxetine  (PROZAC ) capsule 40 mg  40 mg Oral Daily Rome Schlauch C, FNP   40 mg at 01/12/24 0751   gabapentin  (NEURONTIN ) capsule 400 mg  400 mg Oral BID Jalei Shibley C, FNP   400 mg at 01/12/24 0751   hydrOXYzine  (ATARAX ) tablet 25 mg  25 mg Oral TID PRN Olga Seyler C, FNP   25 mg at 01/11/24 2117   magnesium hydroxide (MILK OF MAGNESIA) suspension 30 mL  30 mL Oral Daily PRN Trudy Carwin, NP  metoprolol  succinate (TOPROL -XL) 24 hr tablet 50 mg  50 mg Oral Daily Alessander Sikorski C, FNP   50 mg at 01/12/24 0753   OLANZapine (ZYPREXA) injection 10 mg  10 mg Intramuscular TID PRN Trudy Carwin, NP       OLANZapine (ZYPREXA) injection 5 mg  5 mg Intramuscular TID PRN Trudy Carwin, NP       OLANZapine zydis (ZYPREXA) disintegrating tablet 5 mg  5 mg Oral TID PRN Trudy Carwin, NP   5 mg at 01/09/24 2235   ondansetron  (ZOFRAN -ODT) disintegrating tablet 4 mg  4 mg Oral Q8H PRN Nur Krasinski C, FNP   4 mg at 01/11/24 1521   pantoprazole (PROTONIX) EC tablet 40 mg  40 mg Oral Daily Francesa Eugenio C, FNP   40 mg at 01/12/24 0751   risperiDONE (RISPERDAL) tablet 1 mg  1 mg Oral QHS Lenise Jr C, FNP   1 mg at 01/11/24 2307   traZODone  (DESYREL ) tablet 50 mg  50 mg Oral QHS PRN Maisa Bedingfield C, FNP   50 mg at 01/11/24 2117    Lab Results:  Results for orders placed or performed during the hospital encounter of 01/09/24 (from the past 48 hours)  VITAMIN D  25 Hydroxy (Vit-D Deficiency, Fractures)     Status: None   Collection Time: 01/11/24  6:35 AM  Result Value Ref Range   Vit  D, 25-Hydroxy 89.01 30 - 100 ng/mL    Comment: (NOTE) Vitamin D  deficiency has been defined by the Institute of Medicine  and an Endocrine Society practice guideline as a level of serum 25-OH  vitamin D  less than 20 ng/mL (1,2). The Endocrine Society went on to  further define vitamin D  insufficiency as a level between 21 and 29  ng/mL (2).  1. IOM (Institute of Medicine). 2010. Dietary reference intakes for  calcium and D. [place]: [employer]. 2. Holick MF, Binkley [place], Bischoff-Ferrari HA, et al. Evaluation,  treatment, and prevention of vitamin D  deficiency: an Endocrine  Society clinical practice guideline, JCEM. 2011 Jul; 96(7): 1911-30.  Performed at [facility], [address], [place], [place] [address]   Vitamin B12     Status: Abnormal   Collection Time: 01/11/24  6:35 AM  Result Value Ref Range   Vitamin B-12 >7,500 (H) 180 - 914 pg/mL    Comment: RESULT CONFIRMED BY MANUAL DILUTION (NOTE) This assay is not validated for testing neonatal or myeloproliferative syndrome specimens for Vitamin B12 levels. Performed at [facility], [address], [place], [place] [address]   TSH     Status: None   Collection Time: 01/11/24  6:35 AM  Result Value Ref Range   TSH 2.284 0.350 - 4.500 uIU/mL    Comment: Performed by a 3rd Generation assay with a functional sensitivity of <=0.01 uIU/mL. Performed at [facility], [address] [place], [place] [address]   Lipid panel     Status: Abnormal   Collection Time: 01/11/24  6:35 AM  Result Value Ref Range   Cholesterol 313 (H) 0 - 200 mg/dL   Triglycerides 841 (H) <150 mg/dL   HDL 63 >59 mg/dL   Total CHOL/HDL Ratio 5.0 RATIO   VLDL 32 0 - 40 mg/dL   LDL Cholesterol 781 (H) 0 - 99 mg/dL    Comment:        Total Cholesterol/HDL:CHD Risk Coronary Heart Disease Risk Table  Men   Women  1/2 Average Risk   3.4   3.3  Average Risk       5.0    4.4  2 X Average Risk   9.6   7.1  3 X Average Risk  23.4   11.0        Use the calculated Patient Ratio above and the CHD Risk Table to determine the patient's CHD Risk.        ATP III CLASSIFICATION (LDL):  <100     mg/dL   Optimal  899-870  mg/dL   Near or Above                    Optimal  130-159  mg/dL   Borderline  839-810  mg/dL   High  >809     mg/dL   Very High Performed at [facility], [address] [place], [place] [address]   Hemoglobin A1c     Status: None   Collection Time: 01/11/24  6:35 AM  Result Value Ref Range   Hgb A1c MFr Bld 5.0 4.8 - 5.6 %    Comment: (NOTE) Diagnosis of Diabetes The following HbA1c ranges recommended by the American Diabetes Association (ADA) may be used as an aid in the diagnosis of diabetes mellitus.  Hemoglobin             Suggested A1C NGSP%              Diagnosis  <5.7                   Non Diabetic  5.7-6.4                Pre-Diabetic  >6.4                   Diabetic  <7.0                   Glycemic control for                       adults with diabetes.     Mean Plasma Glucose 96.8 mg/dL    Comment: Performed at [facility], [address] [place], [place] [address]   Blood Alcohol level:  Lab Results  Component Value Date   [facility] <15 01/09/2024    Metabolic Disorder Labs: Lab Results  Component Value Date   HGBA1C 5.0 01/11/2024   MPG 96.8 01/11/2024   No results found for: PROLACTIN Lab Results  Component Value Date   CHOL 313 (H) 01/11/2024   TRIG 158 (H) 01/11/2024   HDL 63 01/11/2024   CHOLHDL 5.0 01/11/2024   VLDL 32 01/11/2024   LDLCALC 218 (H) 01/11/2024   LDLCALC 110 (H) 04/25/2023   Physical Findings: AIMS:  ,  ,  ,  ,  ,  ,   CIWA:    COWS:     Musculoskeletal: Strength & Muscle Tone: within normal limits Gait & Station: normal Patient leans: N/A  Psychiatric Specialty Exam:  Presentation  General Appearance:  Appropriate for Environment; Casual;  Fairly Groomed  Eye Contact: Good  Speech: Clear and Coherent  Speech Volume: Normal  Handedness: Right  Mood and Affect  Mood: Euthymic  Affect: Congruent  Thought Process  Thought Processes: Coherent  Descriptions of Associations:Intact  Orientation:Full (Time, Place and Person)  Thought Content:Logical  History of Schizophrenia/Schizoaffective disorder:No data recorded Duration of Psychotic Symptoms:No  data recorded Hallucinations:Hallucinations: None Description of Auditory Hallucinations: Denies  Ideas of Reference:None  Suicidal Thoughts:Suicidal Thoughts: No SI Passive Intent and/or Plan: -- (Denies)  Homicidal Thoughts:Homicidal Thoughts: No  Sensorium  Memory: Immediate Good; Recent Good  Judgment: Fair  Insight: Fair  Art therapist  Concentration: Good  Attention Span: Good  Recall: Fair  Fund of Knowledge: Fair  Language: Good  Psychomotor Activity  Psychomotor Activity: Psychomotor Activity: Normal  Assets  Assets: Communication Skills; Desire for Improvement; Physical Health; Resilience  Sleep  Sleep: Sleep: Good Number of Hours of Sleep: 7.25  Physical Exam: Physical Exam Vitals and nursing note reviewed.  Constitutional:      General: She is not in acute distress.    Appearance: She is not ill-appearing.  HENT:     Head: Normocephalic.     Right Ear: External ear normal.     Left Ear: External ear normal.     Nose: Nose normal.     Mouth/Throat:     Mouth: Mucous membranes are moist.   Eyes:     Extraocular Movements: Extraocular movements intact.    Cardiovascular:     Rate and Rhythm: Normal rate.     Pulses: Normal pulses.  Pulmonary:     Effort: Pulmonary effort is normal. No respiratory distress.  Abdominal:     Comments: Deferred  Genitourinary:    Comments: Deferred  Musculoskeletal:        General: Normal range of motion.   Skin:    General: Skin is warm.   Neurological:      General: No focal deficit present.     Mental Status: She is alert and oriented to person, place, and time.   Psychiatric:        Mood and Affect: Mood normal.        Behavior: Behavior normal.        Thought Content: Thought content normal.    Review of Systems  Constitutional:  Negative for chills and fever.  HENT:  Negative for sore throat.   Eyes:  Negative for blurred vision.  Respiratory:  Negative for cough, sputum production, shortness of breath and wheezing.   Cardiovascular:  Negative for chest pain and palpitations.  Gastrointestinal:  Negative for heartburn and nausea.  Genitourinary:  Negative for dysuria and urgency.  Musculoskeletal:  Negative for falls.  Skin:  Negative for itching and rash.  Neurological:  Negative for dizziness and headaches.  Endo/Heme/Allergies:        See allergy listing  Psychiatric/Behavioral:  Positive for depression. Negative for hallucinations, substance abuse and suicidal ideas. The patient is nervous/anxious. The patient does not have insomnia.    Blood pressure (!) 149/79, pulse 80, temperature 98 F (36.7 C), temperature source Oral, resp. rate 12, height 5' 4 (1.626 m), weight 90.7 kg, SpO2 100%. Body mass index is 34.33 kg/m.  Treatment Plan Summary: Daily contact with patient to assess and evaluate symptoms and progress in treatment and Medication management Physician Treatment Plan for Primary Diagnosis:  Assessment::  Abagale Barasch is a 53 year old Caucasian female with prior psychiatric diagnoses significant for GAD, MDD, insomnia, and bipolar 2 disorder and past medical history of chronic pain syndrome, fibromyalgia, seizures activities, abdominal pain, obesity, vitamin D  deficiency, and palpitations.  Patient presents voluntarily to [facility]: [facility] from [place]: ED at [facility] depression and abdominal pain in the context of work stressors.    GAD (generalized anxiety disorder)    Plans: Medications: -- Discontinue Zyprexa  5 mg p.o. daily for psychosis -- Discontinue Abilify  5 mg p.o. daily for mood disorder and augmentation of antidepressant -- Increase Prozac  tablets from 20 mg p.o. to 40 mg p.o. daily for depression and anxiety -- Continue hydroxyzine  tablets  25 mg p.o. 3 times daily as needed for anxiety -- Continue trazodone  tablet 50 mg p.o. as needed at bedtime for insomnia -- Continue BuSpar  tablets 10 mg p.o. 3 times daily for anxiety -- Continue  Risperdal 1 mg p.o. daily q. for for auditory hallucinations  Medication for other medical problems:: --Continue Toprol  XL 50 mg 24-hour tablets p.o. daily for high blood pressure -- Continue iron  sulfate 325 mg mg p.o. daily for anemia -- Protonix 40 mg p.o. tablet for GERD --Continue vitamin D  colecalciferol 1000 units p.o. daily for vitamin D  deficiency --Gabapentin  400 mg p.o. daily at bedtime for nerve pain --Continue Augmentin   tablet  500-125 mg tablets   p.o. 3 times daily x 5 days --Ondansetron  4 mg p.o. every 8 hours as needed for nausea or vomiting   Other PRN Medications  -Acetaminophen 650 mg every 6 as needed/mild pain  -Maalox 30 mL oral every 4 as needed/digestion  -Magnesium hydroxide 30 mL daily as needed/mild constipation    --The risks/benefits/side-effects/alternatives to this medication were discussed in detail with the patient and time was given for questions. The patient consents to medication trial.   -- Metabolic profile and EKG monitoring obtained while on an atypical antipsychotic (BMI: Lipid Panel: HbgA1c: QTc:)   - Encouraged patient to participate in unit milieu and in scheduled group therapies    Continue BH Agitation Protocol  -Haldol 5 mg, oral, 3 times daily as needed, mild agitation  --Benadryl 50 mg, oral, 3 times daily as needed, mild agitation                                      OR   --Haldol injection 5 mg, IM, 3 times daily as needed, moderate agitation   --Benadryl injection 50 mg, IM, 3 times daily as needed, moderate agitation  --Ativan  injection 2 mg, IM, 3 times daily as needed, moderate agitation                                      OR  --Haldol injection 10 mg, IM, 3 times daily as needed, severe agitation  --Benadryl injection 50 mg, IM, 3 times daily as needed, severe agitation  --Ativan  injection 2 mg, IM, 3 times daily as needed, severe agitation    Admission labs reviewed:: CMP: Glucose 117, creatinine 1.08, AST 111, ALT 152, otherwise normal.  CBC with differential WBC 11.4, RBC 5.24.  Otherwise normal.  Pregnancy test negative .  BAL less than 15.  UDS: Positive for marijuana   New labs ordered:: Vitamin D  25-hydroxy:89.01, vitamin B-12:7,500 elevated, lipid panel, TSH:2.284, hemoglobin A1c: 5.0.   EKG reviewed:: Sinus tachycardia with fusion complexes, ventricular rate 125, QT/QTc 304/4088.   Safety and Monitoring:  Voluntary admission to inpatient psychiatric unit for safety, stabilization and treatment  Daily contact with patient to assess and evaluate symptoms and progress in treatment  Patient's case to be discussed in multi-disciplinary team meeting  Observation Level : q15 minute checks  Vital signs: q12 hours  Precautions: suicide, but pt currently verbally contracts for safety on  unit?    Discharge Planning:  Social work and case management to assist with discharge planning and identification of hospital follow-up needs prior to discharge  Estimated LOS: 5-7?days  Discharge Concerns: Need to establish a safety plan; Medication compliance and effectiveness  Discharge Goals: Return home with outpatient referrals for mental health follow-up including medication management/psychotherapy.    Long Term Goal(s): Improvement in symptoms so as ready for discharge   Short Term Goals: Ability to identify changes in lifestyle to reduce recurrence of condition will improve, Ability to verbalize feelings will improve, Ability  to disclose and discuss suicidal ideas, Ability to demonstrate self-control will improve, Ability to identify and develop effective coping behaviors will improve, Ability to maintain clinical measurements within normal limits will improve, Compliance with prescribed medications will improve, and Ability to identify triggers associated with substance abuse/mental health issues will improve   Physician Treatment Plan for Secondary Diagnosis: Principal Problem: GAD (generalized anxiety disorder) MDD Bipolar 2 disorder Suicidal ideation Insomnia   I certify that inpatient services furnished can reasonably be expected to improve the patient's condition.     Ellouise JAYSON Azure, FNP 01/12/2024, 1:44 PM Patient ID: Tracie James, female   DOB: 07-07-1971, 53 y.o.   MRN: [id number]

## 2024-01-12 NOTE — Progress Notes (Signed)
   01/12/24 2000  Psych Admission Type (Psych Patients Only)  Admission Status Voluntary  Psychosocial Assessment  Patient Complaints Anxiety;Worrying  Eye Contact Fair  Facial Expression Anxious  Affect Sad;Preoccupied  Speech Logical/coherent  Interaction Assertive  Motor Activity Slow  Appearance/Hygiene Unremarkable  Behavior Characteristics Cooperative;Anxious  Mood Anxious  Aggressive Behavior  Effect No apparent injury  Thought Process  Coherency WDL  Content WDL  Delusions WDL  Perception WDL  Hallucination None reported or observed  Judgment Poor  Confusion WDL  Danger to Self  Current suicidal ideation? Denies  Danger to Others  Danger to Others None reported or observed

## 2024-01-12 NOTE — Group Note (Signed)
 Occupational Therapy Group Note  Group Topic: Sleep Hygiene  Group Date: 01/12/2024 Start Time: 1400 End Time: 1430 Facilitators: Dot [place] MATSU, OT   Group Description: Group encouraged increased participation and engagement through topic focused on sleep hygiene. Patients reflected on the quality of sleep they typically receive and identified areas that need improvement. Group was given background information on sleep and sleep hygiene, including common sleep disorders. Group members also received information on how to improve one's sleep and introduced a sleep diary as a tool that can be utilized to track sleep quality over a length of time. Group session ended with patients identifying one or more strategies they could utilize or implement into their sleep routine in order to improve overall sleep quality.        Therapeutic Goal(s):  Identify one or more strategies to improve overall sleep hygiene  Identify one or more areas of sleep that are negatively impacted (sleep too much, too little, etc)     Participation Level: Engaged   Participation Quality: Independent   Behavior: Appropriate   Speech/Thought Process: Relevant   Affect/Mood: Appropriate   Insight: Fair   Judgement: Fair      Modes of Intervention: Education  Patient Response to Interventions:  Attentive   Plan: Continue to engage patient in OT groups 2 - 3x/week.  01/12/2024  [place] MATSU Dot, OT   Tracie James, OT

## 2024-01-12 NOTE — Group Note (Signed)
 LCSW Group Therapy Note   Group Date: 01/12/2024 Start Time: 1100 End Time: 1200   Participation:  did not attend  Type of Therapy:  Group Therapy  Topic:  Healing Hearts: A Safe Space for Grief  Objective:  The objective of this group, Healing Hearts: A Safe Space for Grief, is to create a compassionate environment where participants can process their grief, explore different stages of grief, and discover ways to honor their loved ones through personal rituals.  3 Goals: Provide a safe and supportive space where participants feel comfortable sharing their feelings and experiences of grief without judgment. Educate participants about the stages of grief and emphasize that there is no right way to grieve or a fixed timeline for healing. Introduce the concept of rituals as a means to process grief, allowing individuals to honor their loved ones in a personal and meaningful way.  Summary:  In Healing Hearts: A Safe Space for Grief, we explored the unique and personal journey of grief, emphasizing that everyone experiences it differently. We discussed the 5 stages of grief (denial, anger, bargaining, depression, and acceptance), with the understanding that grief is not linear. Rituals were introduced as a way to help cope with loss, offering comfort and connection through meaningful actions such as lighting candles or taking memory walks. Participants were encouraged to express their emotions, focus on self-care, and reflect on moments of gratitude for their loved ones, recognizing that healing is a process and there is no timeline for grief.  Therapeutic Modalities: Elements of CBT, Cognitive Reframing: challenge negative thoughts (e.g., guilt, self-blame) by identifying and replacing them with healthier perspectives. Normalizing Grief: Educate that grief doesn't have a fixed timeline and everyone experiences it differently).  DBT Techniques:  Radical Acceptance: Accept the pain of loss  without judgment, acknowledging that grief is a natural and necessary process.   Group Support: Foster peer sharing, normalizing diverse grief experiences in a safe, supportive environment.   Eriyanna Kofoed O Demetre Monaco, LCSWA 01/12/2024  5:36 PM

## 2024-01-12 NOTE — Plan of Care (Signed)
   Problem: Education: Goal: Emotional status will improve Outcome: Progressing Goal: Mental status will improve Outcome: Progressing   Problem: Activity: Goal: Interest or engagement in activities will improve Outcome: Progressing Goal: Sleeping patterns will improve Outcome: Progressing

## 2024-01-12 NOTE — [facility] Group Notes (Signed)
 Spirituality Group   Focus of discussion: Gratitude and Strength Awareness   Process: Following theoretical framework of group therapy of Irvin Yalom and further informed by Rogerian and Relational Cultural Theory approaches, participants invited to name:   Sources of gratitude (internal>external)   Articulate gratitude for self   Name a personal strength/gift/skill   Locate points of resonance among group members/engage the here and now   Conclude with grounding/breathwork    Observations: Tracie James was reserved during group discussion but at times visibly emotive, somewhat tearful. Will seek to follow up 6/26.  Thorsten Climer L. Fredrica, M.Div [phone]

## 2024-01-12 NOTE — Progress Notes (Signed)
 D:  Patient's self inventory sheet, patient sleeps good, no sleep medication.  Fair appetite, normal energy level, good concentration.  Rated depression, hopeless, and anxiety #7.  Denied withdrawals.  Denied SI.  Physical problems, ache, neck, shoulders, back, hips, worst pain in past 24 hours is #5.  Goal is discharge.  No discharge plans yet. A:  Medications administered per MD orders.  Emotional support and encouragement given patient. R:  Denied SI and HI, contracts for safety.  Denied A/V hallucinations.  Safety maintained with 15 minute checks.

## 2024-01-12 NOTE — Group Note (Signed)
 Date:  01/12/2024 Time:  9:14 PM  Group Topic/Focus:  Wrap-Up Group:   The focus of this group is to help patients review their daily goal of treatment and discuss progress on daily workbooks.    Participation Level:  Active  Participation Quality:  Appropriate and Attentive  Affect:  Appropriate  Cognitive:  Alert and Appropriate  Insight: Appropriate and Good  Engagement in Group:  Engaged  Modes of Intervention:  Discussion and Education  Additional Comments:  Pt attended and participated in wrap up group this evening and rated their day an 8/10. Pt stated that they were able to go outside, which positively impacted their day. Pt goal is to be D/C. Pt has no concerns to relay at this time.   Tracie James 01/12/2024, 9:14 PM

## 2024-01-13 DIAGNOSIS — F411 Generalized anxiety disorder: Secondary | ICD-10-CM | POA: Diagnosis not present

## 2024-01-13 LAB — COMPREHENSIVE METABOLIC PANEL WITH GFR
ALT: 150 U/L — ABNORMAL HIGH (ref 0–44)
AST: 81 U/L — ABNORMAL HIGH (ref 15–41)
Albumin: 3.7 g/dL (ref 3.5–5.0)
Alkaline Phosphatase: 82 U/L (ref 38–126)
Anion gap: 12 (ref 5–15)
BUN: 16 mg/dL (ref 6–20)
CO2: 25 mmol/L (ref 22–32)
Calcium: 9.8 mg/dL (ref 8.9–10.3)
Chloride: 99 mmol/L (ref 98–111)
Creatinine, Ser: 1.02 mg/dL — ABNORMAL HIGH (ref 0.44–1.00)
GFR, Estimated: >60 mL/min (ref >60)
Glucose, Bld: 100 mg/dL — ABNORMAL HIGH (ref 70–99)
Potassium: 4.3 mmol/L (ref 3.5–5.1)
Sodium: 136 mmol/L (ref 135–145)
Total Bilirubin: 0.7 mg/dL (ref 0.0–1.2)
Total Protein: 6.7 g/dL (ref 6.5–8.1)

## 2024-01-13 LAB — LIPID PANEL
Cholesterol: 230 mg/dL — ABNORMAL HIGH (ref 0–200)
HDL: 58 mg/dL (ref >40)
LDL Cholesterol: 148 mg/dL — ABNORMAL HIGH (ref 0–99)
Total CHOL/HDL Ratio: 4 ratio
Triglycerides: 120 mg/dL (ref <150)
VLDL: 24 mg/dL (ref 0–40)

## 2024-01-13 LAB — VITAMIN B12: Vitamin B-12: >7500 pg/mL — ABNORMAL HIGH (ref 180–914)

## 2024-01-13 NOTE — Progress Notes (Signed)
 D:  Patient's self inventory sheet, patient has fair sleep, no sleep medication given.  Good appetite, normal energy level, good concentration.  Rated anxiety and depression 5, hopeless #3.  Denied withdrawals.  Denied SI.  Physical problems, body aches, worst pain #3.  Received tylenol .  Goal is discharge.  You have been super to me, and everyone here as I can see.  No discussion on discharge yet. A:  Medications administered per MD orders.  Emotional support and encouragement given patient. R:  Denied SI and HI, contracts for safety.  Denied A/V hallucinations.  Safety maintained with 15 minute checks.

## 2024-01-13 NOTE — Group Note (Signed)
 Recreation Therapy Group Note   Group Topic:Team Building  Group Date: 01/13/2024 Start Time: 0930 End Time: 1000 Facilitators: Amiley Shishido-McCall, LRT,CTRS Location: [address] Dayroom   Group Topic: Communication, Team Building, Problem Solving  Goal Area(s) Addresses:  Patient will effectively work with peer towards shared goal.  Patient will identify skills used to make activity successful.  Patient will identify how skills used during activity can be used to reach post d/c goals.   Behavioral Response: Engaged  Intervention: STEM Activity  Activity: Straw Bridge. In teams of 3-5, patients were given 15 plastic drinking straws and an equal length of masking tape. Using the materials provided, patients were instructed to build a free standing bridge-like structure to suspend an everyday item (ex: puzzle box) off of the floor or table surface. All materials were required to be used by the team in their design. LRT facilitated post-activity discussion reviewing team process. Patients were encouraged to reflect how the skills used in this activity can be generalized to daily life post discharge.   Education: Pharmacist, community, Scientist, physiological, Discharge Planning   Education Outcome: Acknowledges education/In group clarification offered/Needs additional education.    Affect/Mood: Appropriate   Participation Level: Engaged   Participation Quality: Independent   Behavior: Appropriate   Speech/Thought Process: Focused   Insight: Good   Judgement: Good   Modes of Intervention: STEM Activity   Patient Response to Interventions:  Engaged   Education Outcome:  In group clarification offered    Clinical Observations/Individualized Feedback: Pt was focused and engaged during group. Pt and peers were determined to come up with a structure, which they were able to do. Pt was bright and interactive.    Plan: Continue to engage patient in RT group sessions  2-3x/week.   Lasean Rahming-McCall, LRT,CTRS 01/13/2024 12:42 PM

## 2024-01-13 NOTE — Plan of Care (Signed)
   Problem: Education: Goal: Emotional status will improve Outcome: Progressing Goal: Mental status will improve Outcome: Progressing   Problem: Activity: Goal: Interest or engagement in activities will improve Outcome: Progressing Goal: Sleeping patterns will improve Outcome: Progressing

## 2024-01-13 NOTE — Progress Notes (Addendum)
 [facility] MD Progress Note  01/13/2024 3:47 PM Tracie James  MRN:  [id number] Subjective:  Tracie James states, I slept well last night and feeling restful. Principal Problem: GAD (generalized anxiety disorder) Diagnosis: Principal Problem:   GAD (generalized anxiety disorder)  Reason for admission:  Tracie James is a 53 year old Caucasian female with prior psychiatric diagnoses significant for GAD, MDD, insomnia, and bipolar 2 disorder and past medical history of chronic pain syndrome, fibromyalgia, seizures activities, abdominal pain, obesity, vitamin D  deficiency, and palpitations.  Patient presents voluntarily to [facility] from [facility] ED at [facility] for depression and abdominal pain in the context of work stressors.   24 hours chart review: Patient's case with unit by the interdisciplinary team.  Signs of blood pressure 119/78, pulse 73. Trazodone  for insomnia, Tylenol  for mild pain, and hydroxyzine  for anxiety required.  Today's assessment notes: On assessment today, the pt reports that their mood is euthymic, improved since admission, and stable. Denies feeling down, depressed, or sad.  Patient compliant with current psychotropic medications without any side effects.  No EPS, cogwheel rigidity, tardive dyskinesia observed during this assessment.  Patient denies SI, HI, or hearing negative voices in my head.  Reports Risperdal  is effective with her auditory hallucination.  Vitamin B12 with results of 7500 and elevated liver enzymes and abnormal lipid panel.  Patient reports she takes vitamin B12 at home, and plan to reduce the dose since our B12 results is high at 7500 and with elevated liver enzymes.  CMP to be repeated today with lipid panel for follow-up.   Reports that anxiety symptoms are at manageable level.  Sleep is stable. Appetite is stable.  Concentration is without complaint.  Energy level is adequate. Denies having any  suicidal thoughts. Denies having any suicidal intent and plan.  Denies having any HI.  Denies having psychotic symptoms.   Denies having side effects to current psychiatric medications.   Discussed discharge planning:  How to identify the signs of impending crisis, use of internal coping strategies, reaching out to friends and family that can help navigate a crisis, and a list of mental health professionals and agencies to call. Further to follow up on her mental health appointments and her PCP appointments.   Total Time spent with patient: 45 minutes  Past Psychiatric History: Previous Psych Diagnoses: MDD, bipolar 2 disorder, GAD, SI, and insomnia Prior inpatient treatment: Yes x 1 in [place]  Current/prior outpatient treatment: Denies Prior rehab hx: Denies Psychotherapy hx: Yes History of suicide: Yes History of homicide or aggression: Denies Psychiatric medication history: Patient has been on trial of lithium, Celebrex, Zoloft, Wellbutrin , Prozac , regular, Abilify , and Seroquel Psychiatric medication compliance history: Reports compliance Neuromodulation history: Denies Current Psychiatrist: At [facility] Current therapist: Denies  Past Medical History:  Past Medical History:  Diagnosis Date   Anxiety    Asthma    Bipolar 2 disorder (HCC)    Depression    GERD (gastroesophageal reflux disease)    Palpitations    PVC (premature ventricular contraction)     Past Surgical History:  Procedure Laterality Date   APPENDECTOMY     TOTAL ABDOMINAL HYSTERECTOMY     Family History:  Family History  Problem Relation Age of Onset   Depression Mother    Multiple sclerosis Mother    Schizophrenia Maternal Grandmother    Heart disease Maternal Grandmother        aortic valve replacement   Social History:  Social  History   Substance and Sexual Activity  Alcohol Use No     Social History   Substance and Sexual Activity  Drug Use No    Social History    Socioeconomic History   Marital status: Divorced    Spouse name: Not on file   Number of children: Not on file   Years of education: Not on file   Highest education level: Not on file  Occupational History   Not on file  Tobacco Use   Smoking status: Former    Current packs/day: 0.00    Types: Cigarettes    Quit date: 03/31/2014    Years since quitting: 9.7   Smokeless tobacco: Never  Vaping Use   Vaping status: Every Day  Substance and Sexual Activity   Alcohol use: No   Drug use: No   Sexual activity: Yes    Partners: Male    Birth control/protection: Surgical  Other Topics Concern   Not on file  Social History Narrative   Not on file   Social Drivers of Health   Financial Resource Strain: Not on file  Food Insecurity: No Food Insecurity (01/09/2024)   Hunger Vital Sign    Worried About Running Out of Food in the Last Year: Never true    Ran Out of Food in the Last Year: Never true  Transportation Needs: No Transportation Needs (01/09/2024)   PRAPARE - Administrator, Civil Service (Medical): No    Lack of Transportation (Non-Medical): No  Physical Activity: Not on file  Stress: Not on file  Social Connections: Socially Integrated (01/09/2024)   Social Connection and Isolation Panel    Frequency of Communication with Friends and Family: Twice a week    Frequency of Social Gatherings with Friends and Family: Twice a week    Attends Religious Services: More than 4 times per year    Active Member of [employer] or Organizations: Patient declined    Attends Engineer, structural: 1 to 4 times per year    Marital Status: Married   Additional Social History:    Sleep: Good Estimated Sleeping Duration (Last 24 Hours): 6.25-7.50 hours  Appetite:  Fair  Current Medications: Current Facility-Administered Medications  Medication Dose Route Frequency Provider Last Rate Last Admin   acetaminophen  (TYLENOL ) tablet 650 mg  650 mg Oral Q6H PRN Trudy Carwin, NP   650 mg at 01/13/24 0847   alum & mag hydroxide-simeth (MAALOX/MYLANTA) 200-200-20 MG/5ML suspension 30 mL  30 mL Oral Q4H PRN Trudy Carwin, NP       amoxicillin -clavulanate (AUGMENTIN ) 500-125 MG per tablet 1 tablet  1 tablet Oral TID Rooney Swails C, FNP   1 tablet at 01/13/24 1158   buPROPion  (WELLBUTRIN  XL) 24 hr tablet 300 mg  300 mg Oral Daily Martine Bleecker C, FNP   300 mg at 01/13/24 0733   busPIRone  (BUSPAR ) tablet 10 mg  10 mg Oral TID Lavan Imes C, FNP   10 mg at 01/13/24 1159   cholecalciferol  (VITAMIN D3) 25 MCG (1000 UNIT) tablet 5,000 Units  5,000 Units Oral Daily Asaph Serena C, FNP   5,000 Units at 01/13/24 0734   ferrous sulfate  tablet 325 mg  325 mg Oral Q breakfast Zouev, Dmitri, MD   325 mg at 01/13/24 0846   FLUoxetine  (PROZAC ) capsule 40 mg  40 mg Oral Daily Ammiel Guiney C, FNP   40 mg at 01/13/24 9266   gabapentin  (NEURONTIN ) capsule 400 mg  400 mg Oral  BID Xiong Haidar C, FNP   400 mg at 01/13/24 9265   hydrOXYzine  (ATARAX ) tablet 25 mg  25 mg Oral TID PRN Marrianne Sica C, FNP   25 mg at 01/12/24 2103   magnesium  hydroxide (MILK OF MAGNESIA) suspension 30 mL  30 mL Oral Daily PRN Trudy Carwin, NP       metoprolol  succinate (TOPROL -XL) 24 hr tablet 50 mg  50 mg Oral Daily Brayan Votaw C, FNP   50 mg at 01/13/24 9153   OLANZapine  (ZYPREXA ) injection 10 mg  10 mg Intramuscular TID PRN Trudy Carwin, NP       OLANZapine  (ZYPREXA ) injection 5 mg  5 mg Intramuscular TID PRN Trudy Carwin, NP       OLANZapine  zydis (ZYPREXA ) disintegrating tablet 5 mg  5 mg Oral TID PRN Trudy Carwin, NP   5 mg at 01/09/24 2235   ondansetron  (ZOFRAN -ODT) disintegrating tablet 4 mg  4 mg Oral Q8H PRN Wacey Zieger C, FNP   4 mg at 01/11/24 1521   pantoprazole  (PROTONIX ) EC tablet 40 mg  40 mg Oral Daily Teneisha Gignac C, FNP   40 mg at 01/13/24 0734   risperiDONE  (RISPERDAL ) tablet 1 mg  1 mg Oral QHS Vietta Bonifield C, FNP   1 mg at 01/12/24 2102   traZODone  (DESYREL ) tablet 50 mg  50 mg Oral QHS PRN  Imari Reen C, FNP   50 mg at 01/12/24 2103    Lab Results:  No results found for this or any previous visit (from the past 48 hours).  Blood Alcohol level:  Lab Results  Component Value Date   [facility] <15 01/09/2024    Metabolic Disorder Labs: Lab Results  Component Value Date   HGBA1C 5.0 01/11/2024   MPG 96.8 01/11/2024   No results found for: PROLACTIN Lab Results  Component Value Date   CHOL 313 (H) 01/11/2024   TRIG 158 (H) 01/11/2024   HDL 63 01/11/2024   CHOLHDL 5.0 01/11/2024   VLDL 32 01/11/2024   LDLCALC 218 (H) 01/11/2024   LDLCALC 110 (H) 04/25/2023   Physical Findings: AIMS:  ,  ,  ,  ,  ,  ,   CIWA:    COWS:     Musculoskeletal: Strength & Muscle Tone: within normal limits Gait & Station: normal Patient leans: N/A  Psychiatric Specialty Exam:  Presentation  General Appearance:  Appropriate for Environment; Casual  Eye Contact: Good  Speech: Clear and Coherent; Normal Rate  Speech Volume: Normal  Handedness: Right  Mood and Affect  Mood: Euthymic  Affect: Congruent  Thought Process  Thought Processes: Coherent  Descriptions of Associations:Intact  Orientation:Full (Time, Place and Person)  Thought Content:Logical  History of Schizophrenia/Schizoaffective disorder:No data recorded Duration of Psychotic Symptoms:No data recorded Hallucinations:Hallucinations: None Description of Auditory Hallucinations: Denies  Ideas of Reference:None  Suicidal Thoughts:Suicidal Thoughts: No SI Passive Intent and/or Plan: -- (denies)  Homicidal Thoughts:Homicidal Thoughts: No  Sensorium  Memory: Immediate Good; Recent Good  Judgment: Fair  Insight: Fair  Art therapist  Concentration: Good  Attention Span: Good  Recall: Good  Fund of Knowledge: Good  Language: Good  Psychomotor Activity  Psychomotor Activity: Psychomotor Activity: Normal  Assets  Assets: Communication Skills; Desire for Improvement;  Physical Health; Resilience  Sleep  Sleep: Sleep: Good Number of Hours of Sleep: 8  Physical Exam: Physical Exam Vitals and nursing note reviewed.  Constitutional:      General: She is not in acute distress.    Appearance:  She is not ill-appearing.  HENT:     Head: Normocephalic.     Right Ear: External ear normal.     Left Ear: External ear normal.     Nose: Nose normal.     Mouth/Throat:     Mouth: Mucous membranes are moist.   Eyes:     Extraocular Movements: Extraocular movements intact.    Cardiovascular:     Rate and Rhythm: Normal rate.     Pulses: Normal pulses.  Pulmonary:     Effort: Pulmonary effort is normal. No respiratory distress.  Abdominal:     Comments: Deferred  Genitourinary:    Comments: Deferred  Musculoskeletal:        General: Normal range of motion.   Skin:    General: Skin is warm.   Neurological:     General: No focal deficit present.     Mental Status: She is alert and oriented to person, place, and time.   Psychiatric:        Mood and Affect: Mood normal.        Behavior: Behavior normal.        Thought Content: Thought content normal.    Review of Systems  Constitutional:  Negative for chills and fever.  HENT:  Negative for sore throat.   Eyes:  Negative for blurred vision.  Respiratory:  Negative for cough, sputum production, shortness of breath and wheezing.   Cardiovascular:  Negative for chest pain and palpitations.  Gastrointestinal:  Negative for heartburn and nausea.  Genitourinary:  Negative for dysuria and urgency.  Musculoskeletal:  Negative for falls.  Skin:  Negative for itching and rash.  Neurological:  Negative for dizziness and headaches.  Endo/Heme/Allergies:        See allergy listing  Psychiatric/Behavioral:  Positive for depression. Negative for hallucinations, substance abuse and suicidal ideas. The patient is nervous/anxious. The patient does not have insomnia.    Blood pressure 119/78, pulse 73,  temperature 97.9 F (36.6 C), temperature source Oral, resp. rate 12, height 5' 4 (1.626 m), weight 90.7 kg, SpO2 100%. Body mass index is 34.33 kg/m.  Treatment Plan Summary: Daily contact with patient to assess and evaluate symptoms and progress in treatment and Medication management Physician Treatment Plan for Primary Diagnosis:  Assessment::  Tracie James is a 53 year old Caucasian female with prior psychiatric diagnoses significant for GAD, MDD, insomnia, and bipolar 2 disorder and past medical history of chronic pain syndrome, fibromyalgia, seizures activities, abdominal pain, obesity, vitamin D  deficiency, and palpitations.  Patient presents voluntarily to [facility]: [facility] from [place]: ED at [facility] depression and abdominal pain in the context of work stressors.    GAD (generalized anxiety disorder)   Plans: Medications: -- Discontinue Zyprexa  5 mg p.o. daily for psychosis -- Discontinue Abilify   5 mg p.o. daily for mood disorder and augmentation of antidepressant -- Continue Prozac  tablets from 20 mg p.o. to 40 mg p.o. daily for depression and anxiety -- Continue hydroxyzine  tablets  25 mg p.o. 3 times daily as needed for anxiety -- Continue trazodone  tablet 50 mg p.o. as needed at bedtime for insomnia -- Continue BuSpar  tablets 10 mg p.o. 3 times daily for anxiety -- Continue  Risperdal  1 mg p.o. daily q. for for auditory hallucinations  Medication for other medical problems:: --Continue Toprol  XL 50 mg 24-hour tablets p.o. daily for high blood pressure --Continue iron  sulfate 325 mg mg p.o. daily for anemia -- Seen since Protonix  40 mg p.o. tablet  for GERD --Continue vitamin D  colecalciferol 1000 units p.o. daily for vitamin D  deficiency --Gabapentin  400 mg p.o. daily at bedtime for nerve pain --Continue Augmentin    tablet  500-125 mg tablets   p.o. 3 times daily x 5 days --Ondansetron  4 mg p.o. every 8 hours as needed for nausea or  vomiting   Other PRN Medications  -Acetaminophen  650 mg every 6 as needed/mild pain  -Maalox 30 mL oral every 4 as needed/digestion  -Magnesium  hydroxide 30 mL daily as needed/mild constipation    --The risks/benefits/side-effects/alternatives to this medication were discussed in detail with the patient and time was given for questions. The patient consents to medication trial.   -- Metabolic profile and EKG monitoring obtained while on an atypical antipsychotic (BMI: Lipid Panel: HbgA1c: QTc:)   - Encouraged patient to participate in unit milieu and in scheduled group therapies    Continue BH Agitation Protocol  -Haldol 5 mg, oral, 3 times daily as needed, mild agitation  --Benadryl 50 mg, oral, 3 times daily as needed, mild agitation                                      OR   --Haldol injection 5 mg, IM, 3 times daily as needed, moderate agitation  --Benadryl injection 50 mg, IM, 3 times daily as needed, moderate agitation  --Ativan  injection 2 mg, IM, 3 times daily as needed, moderate agitation                                      OR  --Haldol injection 10 mg, IM, 3 times daily as needed, severe agitation  --Benadryl injection 50 mg, IM, 3 times daily as needed, severe agitation  --Ativan  injection 2 mg, IM, 3 times daily as needed, severe agitation    Admission labs reviewed:: CMP: Glucose 117, creatinine 1.08, AST 111, ALT 152, otherwise normal.  CBC with differential WBC 11.4, RBC 5.24.  Otherwise normal.  Pregnancy test negative .  BAL less than 15.  UDS: Positive for marijuana   New labs ordered: Vitamin D  25-hydroxy:89.01, vitamin B-12:7,500 elevated, lipid panel, TSH:2.284, hemoglobin A1c: 5.0.   EKG reviewed: Sinus tachycardia with fusion complexes, ventricular rate 125, QT/QTc 304/4088.   Safety and Monitoring:  Voluntary admission to inpatient psychiatric unit for safety, stabilization and treatment  Daily contact with patient to assess and evaluate symptoms and progress in  treatment  Patient's case to be discussed in multi-disciplinary team meeting  Observation Level : q15 minute checks  Vital signs: q12 hours  Precautions: suicide, but pt currently verbally contracts for safety on unit?    Discharge Planning:  Social work and case management to assist with discharge planning and identification of hospital follow-up needs prior to discharge  Estimated LOS: 5-7?days  Discharge Concerns: Need to establish a safety plan; Medication compliance and effectiveness  Discharge Goals: Return home with outpatient referrals for mental health follow-up including medication management/psychotherapy.    Long Term Goal(s): Improvement in symptoms so as ready for discharge   Short Term Goals: Ability to identify changes in lifestyle to reduce recurrence of condition will improve, Ability to verbalize feelings will improve, Ability to disclose and discuss suicidal ideas, Ability to demonstrate self-control will improve, Ability to identify and develop effective coping behaviors will improve, Ability to maintain clinical measurements within normal  limits will improve, Compliance with prescribed medications will improve, and Ability to identify triggers associated with substance abuse/mental health issues will improve   Physician Treatment Plan for Secondary Diagnosis: Principal Problem: GAD (generalized anxiety disorder) MDD Bipolar 2 disorder Suicidal ideation Insomnia   I certify that inpatient services furnished can reasonably be expected to improve the patient's condition.     Tracie JAYSON Azure, FNP 01/13/2024, 3:47 PM Patient ID: Tracie James, female   DOB: 06/28/1971, 53 y.o.   MRN: [id number] Patient ID: Tracie James, female   DOB: 1971/02/27, 53 y.o.   MRN: [id number]

## 2024-01-13 NOTE — Plan of Care (Signed)
 Nurse discussed anxiety, depression and coping skills with patient.

## 2024-01-14 DIAGNOSIS — F411 Generalized anxiety disorder: Secondary | ICD-10-CM | POA: Diagnosis not present

## 2024-01-14 MED ORDER — FLUOXETINE HCL 40 MG PO CAPS: 40.0000 mg | ORAL_CAPSULE | Freq: Every day | ORAL | 3 refills | Status: DC

## 2024-01-14 MED ORDER — HYDROXYZINE HCL 25 MG PO TABS: 25.0000 mg | ORAL_TABLET | Freq: Three times a day (TID) | ORAL | 0 refills | Status: DC | PRN

## 2024-01-14 MED ORDER — BUSPIRONE HCL 10 MG PO TABS: 10.0000 mg | ORAL_TABLET | Freq: Three times a day (TID) | ORAL | 0 refills | Status: DC

## 2024-01-14 MED ORDER — TRAZODONE HCL 50 MG PO TABS: 50.0000 mg | ORAL_TABLET | Freq: Every evening | ORAL | 0 refills | Status: DC | PRN

## 2024-01-14 MED ORDER — FERROUS SULFATE 325 (65 FE) MG PO TABS: 325.0000 mg | ORAL_TABLET | Freq: Every day | ORAL | 3 refills | Status: AC

## 2024-01-14 MED ORDER — BUPROPION HCL ER (XL) 300 MG PO TB24: 300.0000 mg | ORAL_TABLET | Freq: Every day | ORAL | 0 refills | Status: DC

## 2024-01-14 MED ORDER — VITAMIN D3 25 MCG PO TABS: 5000.0000 [IU] | ORAL_TABLET | Freq: Every day | ORAL | 0 refills | Status: AC

## 2024-01-14 MED ORDER — PANTOPRAZOLE SODIUM 40 MG PO TBEC: 40.0000 mg | DELAYED_RELEASE_TABLET | Freq: Every evening | ORAL | 0 refills | Status: DC

## 2024-01-14 MED ORDER — METOPROLOL SUCCINATE ER 50 MG PO TB24: 50.0000 mg | ORAL_TABLET | Freq: Every day | ORAL | 0 refills | Status: DC

## 2024-01-14 MED ORDER — RISPERIDONE 1 MG PO TABS: 1.0000 mg | ORAL_TABLET | Freq: Every day | ORAL | 0 refills | Status: DC

## 2024-01-14 MED ORDER — GABAPENTIN 400 MG PO CAPS: 400.0000 mg | ORAL_CAPSULE | Freq: Two times a day (BID) | ORAL | 0 refills | Status: DC

## 2024-01-14 NOTE — Progress Notes (Signed)
  [facility] Adult Case Management Discharge Plan :  Will you be returning to the same living situation after discharge:  Yes,  pt returning home at discharge At discharge, do you have transportation home?: Yes,  pt will be picked up around 1130AM Do you have the ability to pay for your medications: Yes,  pt has active health insurance coverage  Release of information consent forms completed and in the chart;  Patient's signature needed at discharge.  Patient to Follow up at:  Follow-up Information     [facility] Follow up on 01/27/2024.   Specialty: Behavioral Health Why: You have an appointment for medication management services on 01/27/24 at 9:00 am.  You are on the wait list for therapy services. Contact information: [address] [place]  [address] [phone]        [facility] Follow up on 01/16/2024.   Why: You may call this provider on 01/16/24 at 9:00 am to establish care for interim therapy services.  Please use option # 8 . Contact information: [address] [place] [address] [phone]                 Next level of care provider has access to [facility] Link:no  Safety Planning and Suicide Prevention discussed: Chaney Agent Adams,significant other [phone]     Has patient been referred to the Quitline?: Patient does not use tobacco/nicotine  products - quit date 03/31/14  Patient has been referred for addiction treatment: No known substance use disorder.  Jenkins LULLA Primer, LCSWA 01/14/2024, 8:46 AM

## 2024-01-14 NOTE — Discharge Summary (Addendum)
 Physician Discharge Summary Note  Patient:  Tracie James is an 53 y.o., female MRN:  [id number] DOB:  1970/12/17 Patient phone:  [phone] (home)  Patient address:   [address] [place] [address],   Total Time spent with patient: 45 minutes  Date of Admission:  01/09/2024 Date of Discharge:   01/14/2024  Reason for Admission:   Verniece Linville is a 53 year old Caucasian female with prior psychiatric diagnoses significant for GAD, MDD, insomnia, and bipolar 2 disorder and past medical history of chronic pain syndrome, fibromyalgia, seizures activities, abdominal pain, obesity, vitamin D  deficiency, and palpitations.  Patient presents voluntarily to [facility]:  [facility] from [facility] ED at [facility] for depression and abdominal pain in the context of work stressors.    Principal Problem: GAD (generalized anxiety disorder) Discharge Diagnoses: Principal Problem:   GAD (generalized anxiety disorder)  Past Psychiatric History: Previous Psych Diagnoses: MDD, bipolar 2 disorder, GAD, SI, and insomnia Prior inpatient treatment: Yes x 1 in [place]  Current/prior outpatient treatment: Denies Prior rehab hx: Denies Psychotherapy hx: Yes History of suicide: Yes History of homicide or aggression: Denies Psychiatric medication history: Patient has been on trial of lithium, Celebrex, Zoloft, Wellbutrin , Prozac , regular, Abilify , and Seroquel Psychiatric medication compliance history: Reports compliance Neuromodulation history: Denies Current Psychiatrist: At [facility] Current therapist: Denies  Past Medical History:  Past Medical History:  Diagnosis Date   Anxiety    Asthma    Bipolar 2 disorder (HCC)    Depression    GERD (gastroesophageal reflux disease)    Palpitations    PVC (premature ventricular contraction)     Past Surgical History:  Procedure Laterality Date   APPENDECTOMY     TOTAL  ABDOMINAL HYSTERECTOMY     Family History:  Family History  Problem Relation Age of Onset   Depression Mother    Multiple sclerosis Mother    Schizophrenia Maternal Grandmother    Heart disease Maternal Grandmother        aortic valve replacement   Family Psychiatric  History: See H&P Social History:  Social History   Substance and Sexual Activity  Alcohol Use No     Social History   Substance and Sexual Activity  Drug Use No    Social History   Socioeconomic History   Marital status: Divorced    Spouse name: Not on file   Number of children: Not on file   Years of education: Not on file   Highest education level: Not on file  Occupational History   Not on file  Tobacco Use   Smoking status: Former    Current packs/day: 0.00    Types: Cigarettes    Quit date: 03/31/2014    Years since quitting: 9.7   Smokeless tobacco: Never  Vaping Use   Vaping status: Every Day  Substance and Sexual Activity   Alcohol use: No   Drug use: No   Sexual activity: Yes    Partners: Male    Birth control/protection: Surgical  Other Topics Concern   Not on file  Social History Narrative   Not on file   Social Drivers of Health   Financial Resource Strain: Not on file  Food Insecurity: No Food Insecurity (01/09/2024)   Hunger Vital Sign    Worried About Running Out of Food in the Last Year: Never true    Ran Out of Food in the Last Year: Never true  Transportation Needs: No Transportation Needs (  01/09/2024)   PRAPARE - Administrator, Civil Service (Medical): No    Lack of Transportation (Non-Medical): No  Physical Activity: Not on file  Stress: Not on file  Social Connections: Socially Integrated (01/09/2024)   Social Connection and Isolation Panel    Frequency of Communication with Friends and Family: Twice a week    Frequency of Social Gatherings with Friends and Family: Twice a week    Attends Religious Services: More than 4 times per year    Active Member of  [employer] or Organizations: Patient declined    Attends Banker Meetings: 1 to 4 times per year    Marital Status: Married   Hospital Course:  During the patient's hospitalization, patient had extensive initial psychiatric evaluation, and follow-up psychiatric evaluations every day.  Psychiatric diagnoses provided upon initial assessment:  Diagnosis:  Principal Problem: GAD (generalized anxiety disorder) MDD Bipolar 2 disorder Suicidal ideation Insomnia  Patient's psychiatric medications were adjusted on admission:  -- Discontinue Zyprexa  5 mg p.o. daily for psychosis -- Initiate Abilify   5 mg p.o. daily for mood disorder and augmentation of antidepressant -- Initiate Prozac  tablets 20 mg p.o. daily for depression and anxiety -- Initiate hydroxyzine  tablets  25 mg p.o. 3 times daily as needed for anxiety -- Initiate trazodone  tablet 50 mg p.o. as needed at bedtime for insomnia -- Continue BuSpar  tablets 10 mg p.o. 3 times daily for anxiety  During the hospitalization, other adjustments were made to the patient's psychiatric medication regimen:  Abilify  5 mg p.o. daily for mood disorder was discontinued due to history of seizures Risperdal  1 mg p.o. q. nightly was initiated denies  Patient's care was discussed during the interdisciplinary team meeting every day during the hospitalization.  The patient denies having side effects to prescribed psychiatric medication.  Gradually, patient started adjusting to milieu. The patient was evaluated each day by a clinical provider to ascertain response to treatment. Improvement was noted by the patient's report of decreasing symptoms, improved sleep and appetite, affect, medication tolerance, behavior, and participation in unit programming.  Patient was asked each day to complete a self inventory noting mood, mental status, pain, new symptoms, anxiety and concerns.    Symptoms were reported as significantly decreased or resolved  completely by discharge.   On day of discharge, the patient reports that their mood is stable. The patient denied having suicidal thoughts for more than 48 hours prior to discharge.  Patient denies having homicidal thoughts.  Patient denies having auditory hallucinations.  Patient denies any visual hallucinations or other symptoms of psychosis. The patient was motivated to continue taking medication with a goal of continued improvement in mental health.   The patient reports their target psychiatric symptoms of mood disorder responded well to the psychiatric medications, and the patient reports overall benefit other psychiatric hospitalization. Supportive psychotherapy was provided to the patient. The patient also participated in regular group therapy while hospitalized. Coping skills, problem solving as well as relaxation therapies were also part of the unit programming.  Labs were reviewed with the patient, and abnormal results were discussed with the patient.  The patient is able to verbalize their individual safety plan to this provider.  # It is recommended to the patient to continue psychiatric medications as prescribed, after discharge from the hospital.    # It is recommended to the patient to follow up with your outpatient psychiatric provider and PCP.  # It was discussed with the patient, the impact of alcohol,  drugs, tobacco have been there overall psychiatric and medical wellbeing, and total abstinence from substance use was recommended the patient.ed.  # Prescriptions provided or sent directly to preferred pharmacy at discharge. Patient agreeable to plan. Given opportunity to ask questions. Appears to feel comfortable with discharge.    # In the event of worsening symptoms, the patient is instructed to call the crisis hotline, 911 and or go to the nearest ED for appropriate evaluation and treatment of symptoms. To follow-up with primary care provider for other medical issues, concerns  and or health care needs  # Patient was discharged to home with a plan to follow up as noted below.   Physical Findings: AIMS:  , ,  ,  ,  ,  ,   CIWA:    COWS:     Musculoskeletal: Strength & Muscle Tone: within normal limits Gait & Station: normal Patient leans: N/A  Psychiatric Specialty Exam:  Presentation  General Appearance:  Appropriate for Environment; Casual  Eye Contact: Good  Speech: Clear and Coherent; Normal Rate  Speech Volume: Normal  Handedness: Right  Mood and Affect  Mood: Euthymic  Affect: Congruent  Thought Process  Thought Processes: Coherent  Descriptions of Associations:Intact  Orientation:Full (Time, Place and Person)  Thought Content:Logical  History of Schizophrenia/Schizoaffective disorder:No data recorded Duration of Psychotic Symptoms:No data recorded Hallucinations:Hallucinations: None Description of Auditory Hallucinations: Denies  Ideas of Reference:None  Suicidal Thoughts:Suicidal Thoughts: No SI Passive Intent and/or Plan: -- (denies)  Homicidal Thoughts:Homicidal Thoughts: No  Sensorium  Memory: Immediate Good; Recent Good  Judgment: Fair  Insight: Fair  Art therapist  Concentration: Good  Attention Span: Good  Recall: Good  Fund of Knowledge: Good  Language: Good  Psychomotor Activity  Psychomotor Activity: Psychomotor Activity: Normal  Assets  Assets: Communication Skills; Desire for Improvement; Physical Health; Resilience  Sleep  Sleep: Sleep: Good  Estimated Sleeping Duration (Last 24 Hours): 6.00-6.75 hours  Physical Exam: Physical Exam Vitals and nursing note reviewed.  Constitutional:      General: She is not in acute distress.    Appearance: She is obese. She is not ill-appearing.  HENT:     Head: Normocephalic.     Right Ear: External ear normal.     Left Ear: External ear normal.     Nose: Nose normal.     Mouth/Throat:     Mouth: Mucous membranes are  moist.   Eyes:     Extraocular Movements: Extraocular movements intact.    Cardiovascular:     Rate and Rhythm: Normal rate.     Pulses: Normal pulses.  Pulmonary:     Effort: Pulmonary effort is normal.  Abdominal:     Comments: Deferred   Genitourinary:    Comments: Deferred    Musculoskeletal:        General: Normal range of motion.     Cervical back: Normal range of motion.   Skin:    General: Skin is warm.   Neurological:     General: No focal deficit present.     Mental Status: She is alert and oriented to person, place, and time.   Psychiatric:        Mood and Affect: Mood normal.        Behavior: Behavior normal.        Thought Content: Thought content normal.        Judgment: Judgment normal.    Review of Systems  Constitutional:  Negative for chills and fever.  HENT:  Negative for sore throat.   Eyes:  Negative for blurred vision.  Respiratory:  Negative for cough, sputum production, shortness of breath and wheezing.   Cardiovascular:  Negative for chest pain and palpitations.  Gastrointestinal:  Negative for abdominal pain, constipation, diarrhea, heartburn, nausea and vomiting.       Patient completed prophylactic antibiotic therapy for third extraction  Genitourinary:  Negative for dysuria, frequency and urgency.  Musculoskeletal:  Negative for falls.  Skin:  Negative for itching and rash.  Neurological:  Negative for dizziness, tingling and headaches.  Endo/Heme/Allergies:        See allergy listing  Psychiatric/Behavioral:  Positive for depression (Stable with medications). Negative for hallucinations, substance abuse and suicidal ideas. The patient is nervous/anxious (Improved with medications). The patient does not have insomnia.    Blood pressure 101/63, pulse 87, temperature 97.6 F (36.4 C), temperature source Oral, resp. rate 17, height 5' 4 (1.626 m), weight 90.7 kg, SpO2 100%. Body mass index is 34.33 kg/m.   Social History   Tobacco  Use  Smoking Status Former   Current packs/day: 0.00   Types: Cigarettes   Quit date: 03/31/2014   Years since quitting: 9.7  Smokeless Tobacco Never   Tobacco Cessation:  N/A, patient does not currently use tobacco products  Blood Alcohol level:  Lab Results  Component Value Date   [facility] <15 01/09/2024   Metabolic Disorder Labs:  Lab Results  Component Value Date   HGBA1C 5.0 01/11/2024   MPG 96.8 01/11/2024   No results found for: PROLACTIN Lab Results  Component Value Date   CHOL 230 (H) 01/13/2024   TRIG 120 01/13/2024   HDL 58 01/13/2024   CHOLHDL 4.0 01/13/2024   VLDL 24 01/13/2024   LDLCALC 148 (H) 01/13/2024   LDLCALC 218 (H) 01/11/2024   See Psychiatric Specialty Exam and Suicide Risk Assessment completed by Attending Physician prior to discharge.  Discharge destination:  Home  Is patient on multiple antipsychotic therapies at discharge:  No   Has Patient had three or more failed trials of antipsychotic monotherapy by history:  No  Recommended Plan for Multiple Antipsychotic Therapies: NA  Discharge Instructions     Increase activity slowly   Complete by: As directed       Allergies as of 01/14/2024       Reactions   Erythromycin Hives   Tetracyclines & Related Hives        Medication List     STOP taking these medications    amoxicillin -clavulanate 500-125 MG tablet Commonly known as: AUGMENTIN    gabapentin  400 MG tablet Commonly known as: NEURONTIN  Replaced by: gabapentin  400 MG capsule   LORazepam  1 MG tablet Commonly known as: ATIVAN    multivitamin with minerals Tabs tablet   naproxen  500 MG tablet Commonly known as: NAPROSYN    Slow Iron  160 (50 Fe) MG Tbcr SR tablet Generic drug: ferrous sulfate        TAKE these medications      Indication  buPROPion  300 MG 24 hr tablet Commonly known as: WELLBUTRIN  XL Take 1 tablet (300 mg total) by mouth daily. Start taking on: January 15, 2024 What changed:  medication  strength how much to take additional instructions Another medication with the same name was removed. Continue taking this medication, and follow the directions you see here.  Indication: Major Depressive Disorder   busPIRone  10 MG tablet Commonly known as: BUSPAR  Take 1 tablet (10 mg total) by mouth 3 (three) times daily.  Indication: Anxiety  Disorder   ferrous sulfate  325 (65 FE) MG tablet Take 1 tablet (325 mg total) by mouth daily with breakfast. Start taking on: January 15, 2024  Indication: Iron  Deficiency   FLUoxetine  40 MG capsule Commonly known as: PROZAC  Take 1 capsule (40 mg total) by mouth daily. Start taking on: January 15, 2024 What changed: how much to take  Indication: Major Depressive Disorder   gabapentin  400 MG capsule Commonly known as: NEURONTIN  Take 1 capsule (400 mg total) by mouth 2 (two) times daily. Replaces: gabapentin  400 MG tablet  Indication: Generalized Anxiety Disorder   hydrOXYzine  25 MG tablet Commonly known as: ATARAX  Take 1 tablet (25 mg total) by mouth 3 (three) times daily as needed for anxiety.  Indication: Feeling Anxious   metoprolol  succinate 50 MG 24 hr tablet Commonly known as: TOPROL -XL Take 1 tablet (50 mg total) by mouth daily. Take with or immediately following a meal. Start taking on: January 15, 2024 What changed: additional instructions  Indication: High Blood Pressure   pantoprazole  40 MG tablet Commonly known as: Protonix  Take 1 tablet (40 mg total) by mouth at bedtime for 14 days.  Indication: Gastroesophageal Reflux Disease   risperiDONE  1 MG tablet Commonly known as: RISPERDAL  Take 1 tablet (1 mg total) by mouth at bedtime.  Indication: Major Depressive Disorder   traZODone  50 MG tablet Commonly known as: DESYREL  Take 1 tablet (50 mg total) by mouth at bedtime as needed for sleep. What changed:  how much to take when to take this reasons to take this  Indication: Trouble Sleeping   vitamin D3 25 MCG  tablet Commonly known as: CHOLECALCIFEROL  Take 5 tablets (5,000 Units total) by mouth daily. Start taking on: January 15, 2024 What changed:  medication strength when to take this  Indication: Vitamin D  Deficiency        Follow-up Information     [facility] Follow up on 01/27/2024.   Specialty: Behavioral Health Why: You have an appointment for medication management services on 01/27/24 at 9:00 am.  You are on the wait list for therapy services. Contact information: [address] [place]  [address] [phone]        [facility] Follow up on 01/16/2024.   Why: You may call this provider on 01/16/24 at 9:00 am to establish care for interim therapy services.  Please use option # 8 . Contact information: [address] [place] [address] [phone]                 Follow-up recommendations:   The patient is being discharged with her family.   Patient is to take her discharge medications as ordered. ?See follow up above.   We recommend that she participates in individual therapy to target uncontrollable agitation and substance abuse.    We recommend that she participates in therapy to target the conflict with her family, to improve communication skills and conflict resolution skills. ?patient is to initiate/implement a contingency based behavioral model to address patient's behavior.   We recommend that she gets AIMS scale, height, weight, blood pressure, fasting lipid panel, fasting blood sugar in three months from discharge if she's on atypical antipsychotics.    Patient will benefit from monitoring of recurrent suicidal ideation since patient is on antidepressant medication.   The patient should abstain from all illicit substances and alcohol.   If the patient's symptoms worsen or do not continue to improve or if  the patient becomes actively suicidal or homicidal then it  is recommended that the patient return to the closest hospital emergency room or call 911 for further evaluation and treatment. National Suicide Prevention Lifeline 1800-SUICIDE or 769-212-9447.   Please follow up with your primary medical doctor for all other medical needs.    The patient has been educated on the possible side effects to medications and she/her guardian is to contact a medical professional and inform outpatient provider of any new side effects of medication.   She is to take regular diet and activity as tolerated. ?Will benefit from moderate daily exercise.   Patient was educated about removing/locking any firearms, medications or dangerous products from the home.   Activity:  As tolerated   Diet:  Regular Diet  Signed:  Ellouise JAYSON Azure, FNP 01/14/2024, 9:39 AM

## 2024-01-14 NOTE — Progress Notes (Signed)
   01/14/24 1000  Psych Admission Type (Psych Patients Only)  Admission Status Voluntary  Psychosocial Assessment  Patient Complaints Anxiety  Eye Contact Fair  Facial Expression Anxious  Affect Anxious  Speech Logical/coherent  Interaction Assertive  Motor Activity Slow  Appearance/Hygiene Unremarkable  Behavior Characteristics Cooperative;Appropriate to situation  Mood Anxious;Pleasant  Aggressive Behavior  Effect No apparent injury  Thought Process  Coherency WDL  Content WDL  Delusions WDL  Perception WDL  Hallucination None reported or observed  Judgment Poor  Confusion WDL  Danger to Self  Current suicidal ideation? Denies  Danger to Others  Danger to Others None reported or observed  Danger to Others Abnormal  Harmful Behavior to others No threats or harm toward other people

## 2024-01-14 NOTE — [facility] Suicide Risk Assessment (Signed)
 Suicide Risk Assessment  Discharge Assessment    [facility] Discharge Suicide Risk Assessment   Principal Problem: GAD (generalized anxiety disorder) Discharge Diagnoses: Principal Problem:   GAD (generalized anxiety disorder)  Reason for admission:  Tracie James is a 53 year old Caucasian female with prior psychiatric diagnoses significant for GAD, MDD, insomnia, and bipolar 2 disorder and past medical history of chronic pain syndrome, fibromyalgia, seizures activities, abdominal pain, obesity, vitamin D  deficiency, and palpitations.  Patient presents voluntarily to [facility]:  [facility] from [facility] ED at [facility] for depression and abdominal pain in the context of work stressors.   Total Time spent with patient: 45 minutes  Musculoskeletal: Strength & Muscle Tone: within normal limits Gait & Station: normal Patient leans: N/A  Psychiatric Specialty Exam  Presentation  General Appearance:  Appropriate for Environment; Casual  Eye Contact: Good  Speech: Clear and Coherent; Normal Rate  Speech Volume: Normal  Handedness: Right  Mood and Affect  Mood: Euthymic  Duration of Depression Symptoms: No data recorded Affect: Congruent  Thought Process  Thought Processes: Coherent  Descriptions of Associations:Intact  Orientation:Full (Time, Place and Person)  Thought Content:Logical  History of Schizophrenia/Schizoaffective disorder:No data recorded Duration of Psychotic Symptoms:No data recorded Hallucinations:Hallucinations: None Description of Auditory Hallucinations: Denies  Ideas of Reference:None  Suicidal Thoughts:Suicidal Thoughts: No SI Passive Intent and/or Plan: -- (denies)  Homicidal Thoughts:Homicidal Thoughts: No  Sensorium  Memory: Immediate Good; Recent Good  Judgment: Fair  Insight: Fair  Art therapist  Concentration: Good  Attention Span: Good  Recall: Good  Fund of  Knowledge: Good  Language: Good  Psychomotor Activity  Psychomotor Activity: Psychomotor Activity: Normal  Assets  Assets: Communication Skills; Desire for Improvement; Physical Health; Resilience  Sleep  Sleep: Sleep: Good  Estimated Sleeping Duration (Last 24 Hours): 6.00-6.75 hours  Physical Exam: Physical Exam Vitals and nursing note reviewed.  Constitutional:      General: She is not in acute distress.    Appearance: She is not ill-appearing.  HENT:     Head: Normocephalic.     Right Ear: External ear normal.     Left Ear: External ear normal.     Nose: Nose normal.     Mouth/Throat:     Mouth: Mucous membranes are moist.     Pharynx: Oropharynx is clear.   Eyes:     Extraocular Movements: Extraocular movements intact.    Cardiovascular:     Rate and Rhythm: Normal rate.     Pulses: Normal pulses.  Pulmonary:     Effort: Pulmonary effort is normal. No respiratory distress.  Abdominal:     Comments: Deferred   Musculoskeletal:        General: Normal range of motion.     Cervical back: Normal range of motion.     Comments: Deferred   Skin:    General: Skin is warm.   Neurological:     General: No focal deficit present.     Mental Status: She is alert and oriented to person, place, and time.   Psychiatric:        Mood and Affect: Mood normal.        Behavior: Behavior normal.        Thought Content: Thought content normal.        Judgment: Judgment normal.    Review of Systems  Constitutional:  Negative for chills and fever.  HENT:  Negative for sore throat.   Eyes:  Negative for blurred  vision.  Respiratory:  Negative for cough, sputum production, shortness of breath and wheezing.   Cardiovascular:  Negative for chest pain and palpitations.  Gastrointestinal:  Negative for heartburn and nausea.  Genitourinary:  Negative for dysuria and urgency.  Musculoskeletal:  Negative for falls.  Skin:  Negative for itching and rash.  Neurological:   Negative for dizziness and headaches.  Endo/Heme/Allergies:        See allergy listing  Psychiatric/Behavioral:  Positive for depression (Stable with medication). Negative for hallucinations, substance abuse and suicidal ideas. The patient is nervous/anxious (Improved with medication). The patient does not have insomnia.    Blood pressure 101/63, pulse 87, temperature 97.6 F (36.4 C), temperature source Oral, resp. rate 17, height 5' 4 (1.626 m), weight 90.7 kg, SpO2 100%. Body mass index is 34.33 kg/m.  Mental Status Per Nursing Assessment::   On Admission:  Suicidal ideation indicated by patient, Self-harm thoughts  Demographic Factors:  Caucasian  Loss Factors: NA  Historical Factors: Prior suicide attempts and Impulsivity  Risk Reduction Factors:   Positive social support, Positive therapeutic relationship, and Positive coping skills or problem solving skills  Continued Clinical Symptoms:  Depression:   Impulsivity Recent sense of peace/wellbeing More than one psychiatric diagnosis Previous Psychiatric Diagnoses and Treatments Medical Diagnoses and Treatments/Surgeries  Cognitive Features That Contribute To Risk:  Polarized thinking    Suicide Risk:  Mild:  Suicidal ideation of limited frequency, intensity, duration, and specificity.  There are no identifiable plans, no associated intent, mild dysphoria and related symptoms, good self-control (both objective and subjective assessment), few other risk factors, and identifiable protective factors, including available and accessible social support.   Follow-up Information     [facility] Follow up on 01/27/2024.   Specialty: Behavioral Health Why: You have an appointment for medication management services on 01/27/24 at 9:00 am.  You are on the wait list for therapy services. Contact information: [address] [place]  [address] [phone]         [facility] Follow up on 01/16/2024.   Why: You may call this provider on 01/16/24 at 9:00 am to establish care for interim therapy services.  Please use option # 8 . Contact information: [address] [place] [address] [phone]                Plan Of Care/Follow-up recommendations:  The patient is being discharged with her family.   Patient is to take her discharge medications as ordered. ?See follow up above.   We recommend that she participates in individual therapy to target uncontrollable agitation and substance abuse.    We recommend that she participates in therapy to target the conflict with her family, to improve communication skills and conflict resolution skills. ?patient is to initiate/implement a contingency based behavioral model to address patient's behavior.   We recommend that she gets AIMS scale, height, weight, blood pressure, fasting lipid panel, fasting blood sugar in three months from discharge if she's on atypical antipsychotics.    Patient will benefit from monitoring of recurrent suicidal ideation since patient is on antidepressant medication.   The patient should abstain from all illicit substances and alcohol.   If the patient's symptoms worsen or do not continue to improve or if the patient becomes actively suicidal or homicidal then it is recommended that the patient return to the closest hospital emergency room or call 911 for further evaluation and treatment. National  Suicide Prevention Lifeline 1800-SUICIDE or [phone].   Please follow up with your primary medical doctor for all other medical needs.    The patient has been educated on the possible side effects to medications and she/her guardian is to contact a medical professional and inform outpatient provider of any new side effects of medication.   She is to take regular diet and activity as tolerated. ?Will benefit from moderate daily exercise.   Patient  was educated about removing/locking any firearms, medications or dangerous products from the home.   Activity:  As tolerated   Diet:  Regular Diet   Ellouise JAYSON Azure, FNP 01/14/2024, 9:02 AM

## 2024-01-14 NOTE — Progress Notes (Signed)
Pt discharged to lobby. Pt was stable and appreciative at that time. All papers and prescriptions were given and valuables returned. Suicide safety plan completed. Verbal understanding expressed. Denies SI/HI and A/VH. Pt given opportunity to express concerns and ask questions.

## 2024-01-18 ENCOUNTER — Telehealth: Payer: Self-pay | Admitting: Psychiatry

## 2024-01-18 NOTE — Telephone Encounter (Signed)
 the patient has an appt for tomorrow the [facility] paperwork is in your basket up front.

## 2024-01-18 NOTE — Telephone Encounter (Signed)
 Patient stopped by the office to drop of FMLA paperwork. It's for her absence during [facility] hospitalization for her job at [employer]. States it needs to be complete by February 05, 2024. Paperwork given to [place], [place]. ROI signed and scanned. Call patient to pick it up once complete

## 2024-01-19 ENCOUNTER — Other Ambulatory Visit: Payer: Self-pay

## 2024-01-19 ENCOUNTER — Ambulatory Visit (INDEPENDENT_AMBULATORY_CARE_PROVIDER_SITE_OTHER): Admitting: Psychiatry

## 2024-01-19 ENCOUNTER — Encounter: Payer: Self-pay | Admitting: Psychiatry

## 2024-01-19 VITALS — BP 101/63 | HR 75 | Temp 97.4°F | Ht 64.0 in | Wt 222.4 lb

## 2024-01-19 DIAGNOSIS — F332 Major depressive disorder, recurrent severe without psychotic features: Secondary | ICD-10-CM | POA: Diagnosis not present

## 2024-01-19 DIAGNOSIS — Z79899 Other long term (current) drug therapy: Secondary | ICD-10-CM | POA: Diagnosis not present

## 2024-01-19 DIAGNOSIS — G4700 Insomnia, unspecified: Secondary | ICD-10-CM | POA: Diagnosis not present

## 2024-01-19 DIAGNOSIS — F419 Anxiety disorder, unspecified: Secondary | ICD-10-CM | POA: Diagnosis not present

## 2024-01-19 NOTE — Patient Instructions (Addendum)
 Continue fluoxetine  40 mg daily Continue bupropion  300 mg daily Discontinue risperidone   Continue gabapentin  400 mg at night Continue Buspar  10 mg three times a day  Continue lorazepam  1 mg as needed for anxiety  Continue Trazodone  25-50 mg at night as needed for insomnia Referral to [facility] Next appointment - 7/11 at 9 am

## 2024-01-19 NOTE — Telephone Encounter (Signed)
 Form is completed.

## 2024-01-19 NOTE — Progress Notes (Addendum)
 BH MD/PA/NP OP Progress Note  01/19/2024 6:50 PM Tracie James  MRN:  [id number]  Chief Complaint:  Chief Complaint  Patient presents with   Follow-up   HPI:  Chart reviewed.  According to the chart review, she was admitted 6/23-28/2025 at [facility] for significant worsening in depression, anxiety, SI with thoughts to shoot herself in the head, overdosing medication.    Discharged medication- fluoxetine  40 mg daily, bupropion  300 mg daily, buspirone  10 mg TID, risperidone  1 mg at night,  gabapentin  100 mg BID, hydroxyzine  25 mg three times a day, trazodone  50 mg at night as needed for insomnia  She was seen by Dr. Lane, neurologist, last in 01/2024.   SEIZURE-LIKE ACTIVITY/ ANXIETY/ DEPRESSION  Patient stated she had seizure like activity during on 12/27/2021. Patient was in the passenger seat of a car. Her family had went into the funeral home, and when they came out she had a seizure. Denies tongue biting, or urine incontinence during event. She had a normal routine EEG. She feels like her anxiety is about the same. Husband states her anxiety has been high. Takes Wellbutrin  450 mg daily, Buspar  5 mg twice a day, Prozac  40, and Lamictal  100 mg twice a day. Also takes Gabapentin  600 mg in the morning, 100 mg in the afternoon and 300 mg at night.  01/06/2022: IMPRESSION: This routine EEG in the awake and asleep states is within normal limits.  12/27/2021: IMPRESSION: This routine EEG in the awake and asleep states is within normal limits.  Tracie James is a 53 y.o. female presenting for evaluation of  SEIZURE-LIKE ACTIVITY/ ANXIETY/ DEPRESSION  - Ongoing  - Patient with seizure like activity on 12/27/2021, denies incontinence and tongue biting during event. Has continued anxiety. - Reviewed routine and extended EEG, wnl. - Start Abilify  5 mg nightly. - Increase Lamictal  to 100 mg in morning and 150 mg in evening for two weeks, then increase to 150 mg twice daily. - Will request records  from sleep lab -- called and requested today, 01/20/22. - Will check Lamictal  levels at next visit - Can consider increasing abilify  to 10 mg at future visit     This is a aftercare visit since the recent admission.  She states that she went spiral down since the previous visit.  She was fighting with the insurance.  She struggled with pain, had abscess.  She was prescribed Augmentin , which causes nausea and decrease in appetite.  She could not take medication for 5 days.  She could not go to work as she was afraid that she could hurt patient accidentally as she could hardly function. She was worried about everything, including what she was going to do at the job.  She was arguing with herself, lying in the bed.  She dug rabbit hole.  She thought of coming to the hospital as she was not doing anything.  She agrees that she had SI, although she did not act on this.  She finds admission to be helpful.  She has been working on [employer],  decorating the house.  She has her nails done.  She went out with her boyfriend.  However, she feels sleepy, and is struggling to get things accomplished.  She has never been this way.  She feels stupid.  The patient has mood symptoms as in PHQ-9/GAD-7.  Although she was struggling with insomnia, she was able to sleep up to 6 hours last night.  She denies any SI since being discharged from the  hospital.  She denies AH, VH, paranoia.  When she was asked about hallucinations which was documented during the hospitalization, she states that it was her internal thoughts, and adamantly denies any hallucinations.   Mania/hypomania-it was discussed in details about the previous diagnosis/history of bipolar disorder.  She denies any history of euphoria, decreased need for sleep.  There was 2 days of spending money, buying things for other people as she wanted to feel better.  She denies any hallucinations.   Physical-she has not had any seizure in the last few years.  According  to the chart review, Abilify  was recommended by her neurologist.  Although she was not aware of this, she has not had any appointments with neurologist.  She states that her boyfriend mentions she has been off balance lately.   Employment: Designer, industrial/product Support: boyfriend of ten years Household: boyfriend of 11 years in 2022 Marital status: divorced Number of children: 2 daughters, oldest is 22 year old, will move to [place]   Substance use  Tobacco Alcohol Other substances/  Current  denies Vape THC three weeks ago  Past     Past Treatment        Visit Diagnosis:    ICD-10-CM   1. Severe episode of recurrent major depressive disorder, without psychotic features (HCC)  F33.2     2. Anxiety  F41.9     3. Insomnia, unspecified type  G47.00     4. High risk medication use  Z79.899       Past Psychiatric History: Please see initial evaluation for full details. I have reviewed the history. No updates at this time.     Past Medical History:  Past Medical History:  Diagnosis Date   Anxiety    Asthma    Bipolar 2 disorder (HCC)    Depression    GERD (gastroesophageal reflux disease)    Palpitations    PVC (premature ventricular contraction)     Past Surgical History:  Procedure Laterality Date   APPENDECTOMY     TOTAL ABDOMINAL HYSTERECTOMY      Family Psychiatric History: Please see initial evaluation for full details. I have reviewed the history. No updates at this time.     Family History:  Family History  Problem Relation Age of Onset   Depression Mother    Multiple sclerosis Mother    Schizophrenia Maternal Grandmother    Heart disease Maternal Grandmother        aortic valve replacement    Social History:  Social History   Socioeconomic History   Marital status: Divorced    Spouse name: Not on file   Number of children: Not on file   Years of education: Not on file   Highest education level: Not on file  Occupational History   Not on file  Tobacco Use    Smoking status: Former    Current packs/day: 0.00    Types: Cigarettes    Quit date: 03/31/2014    Years since quitting: 9.8   Smokeless tobacco: Never  Vaping Use   Vaping status: Every Day  Substance and Sexual Activity   Alcohol use: No   Drug use: No   Sexual activity: Yes    Partners: Male    Birth control/protection: Surgical  Other Topics Concern   Not on file  Social History Narrative   Not on file   Social Drivers of Health   Financial Resource Strain: Not on file  Food Insecurity: No Food Insecurity (01/09/2024)  Hunger Vital Sign    Worried About Running Out of Food in the Last Year: Never true    Ran Out of Food in the Last Year: Never true  Transportation Needs: No Transportation Needs (01/09/2024)   PRAPARE - Administrator, Civil Service (Medical): No    Lack of Transportation (Non-Medical): No  Physical Activity: Not on file  Stress: Not on file  Social Connections: Socially Integrated (01/09/2024)   Social Connection and Isolation Panel    Frequency of Communication with Friends and Family: Twice a week    Frequency of Social Gatherings with Friends and Family: Twice a week    Attends Religious Services: More than 4 times per year    Active Member of [employer] or Organizations: Patient declined    Attends Banker Meetings: 1 to 4 times per year    Marital Status: Married    Allergies:  Allergies  Allergen Reactions   Erythromycin Hives   Tetracyclines & Related Hives    Metabolic Disorder Labs: Lab Results  Component Value Date   HGBA1C 5.0 01/11/2024   MPG 96.8 01/11/2024   No results found for: PROLACTIN Lab Results  Component Value Date   CHOL 230 (H) 01/13/2024   TRIG 120 01/13/2024   HDL 58 01/13/2024   CHOLHDL 4.0 01/13/2024   VLDL 24 01/13/2024   LDLCALC 148 (H) 01/13/2024   LDLCALC 218 (H) 01/11/2024   Lab Results  Component Value Date   TSH 2.284 01/11/2024   TSH 1.110 11/04/2023    Therapeutic Level  Labs: No results found for: LITHIUM No results found for: VALPROATE No results found for: CBMZ  Current Medications: Current Outpatient Medications  Medication Sig Dispense Refill   buPROPion  (WELLBUTRIN  XL) 300 MG 24 hr tablet Take 1 tablet (300 mg total) by mouth daily. 30 tablet 0   busPIRone  (BUSPAR ) 10 MG tablet Take 1 tablet (10 mg total) by mouth 3 (three) times daily. 90 tablet 0   cholecalciferol  (CHOLECALCIFEROL ) 25 MCG tablet Take 5 tablets (5,000 Units total) by mouth daily. 30 tablet 0   ferrous sulfate  325 (65 FE) MG tablet Take 1 tablet (325 mg total) by mouth daily with breakfast. 30 tablet 3   FLUoxetine  (PROZAC ) 40 MG capsule Take 1 capsule (40 mg total) by mouth daily. 30 capsule 3   gabapentin  (NEURONTIN ) 400 MG capsule Take 1 capsule (400 mg total) by mouth 2 (two) times daily. 60 capsule 0   hydrOXYzine  (ATARAX ) 25 MG tablet Take 1 tablet (25 mg total) by mouth 3 (three) times daily as needed for anxiety. 30 tablet 0   metoprolol  succinate (TOPROL -XL) 50 MG 24 hr tablet Take 1 tablet (50 mg total) by mouth daily. Take with or immediately following a meal. 30 tablet 0   pantoprazole  (PROTONIX ) 40 MG tablet Take 1 tablet (40 mg total) by mouth at bedtime for 14 days. (Patient not taking: Reported on 01/19/2024) 14 tablet 0   risperiDONE  (RISPERDAL ) 1 MG tablet Take 1 tablet (1 mg total) by mouth at bedtime. 30 tablet 0   traZODone  (DESYREL ) 50 MG tablet Take 1 tablet (50 mg total) by mouth at bedtime as needed for sleep. 30 tablet 0   No current facility-administered medications for this visit.     Musculoskeletal: Strength & Muscle Tone: within normal limits Gait & Station: normal Patient leans: N/A  Psychiatric Specialty Exam: Review of Systems  Psychiatric/Behavioral:  Positive for decreased concentration, dysphoric mood and sleep disturbance. Negative for  agitation, behavioral problems, confusion, hallucinations, self-injury and suicidal ideas. The patient  is nervous/anxious. The patient is not hyperactive.   All other systems reviewed and are negative.   Blood pressure 101/63, pulse 75, temperature (!) 97.4 F (36.3 C), temperature source Temporal, height 5' 4 (1.626 m), weight 222 lb 6.4 oz (100.9 kg).Body mass index is 38.17 kg/m.  General Appearance: Well Groomed  Eye Contact:  Good  Speech:  Clear and Coherent  Volume:  Normal  Mood:  Depressed  Affect:  Appropriate, Congruent, and down  Thought Process:  Coherent  Orientation:  Full (Time, Place, and Person)  Thought Content: Logical   Suicidal Thoughts:  No  Homicidal Thoughts:  No  Memory:  Immediate;   Good  Judgement:  Good  Insight:  Good  Psychomotor Activity:  Normal  Concentration:  Concentration: Good and Attention Span: Good  Recall:  Good  Fund of Knowledge: Good  Language: Good  Akathisia:  No  Handed:  Right  AIMS (if indicated): not done  Assets:  Communication Skills Desire for Improvement  ADL's:  Intact  Cognition: WNL  Sleep:  Fair   Screenings: AUDIT    Flowsheet Row [facility]  Alcohol Use Disorder Identification Test Final Score (AUDIT) 0   GAD-7    Flowsheet Row Office Visit from 01/19/2024 in [facility] Office Visit from 11/04/2023 in [facility] Office Visit from 08/12/2023 in [facility] Office Visit from 06/23/2023 in [facility] Office Visit from 04/25/2023 in [facility]  Total GAD-7 Score 21 21 21 21 18    PHQ2-9    Flowsheet Row Office Visit from 01/19/2024 in [facility] Office Visit from 11/04/2023 in [facility] Office Visit from 08/12/2023 in [facility] Office Visit from 06/23/2023 in [facility] Office Visit from  04/25/2023 in [facility]  PHQ-2 Total Score 5 2 3 3 3   PHQ-9 Total Score 24 18 11 13 11    Flowsheet Row Office Visit from 01/19/2024 in [facility] Most recent reading at 01/19/2024  6:49 PM Admission (Discharged) [facility] Most recent reading at 01/09/2024 10:29 PM ED from 01/09/2024 in [facility] Most recent reading at 01/09/2024 10:40 AM  C-SSRS RISK CATEGORY Error: Q7 should not be populated when Q6 is No Moderate Risk Moderate Risk     Assessment and Plan:  Tracie James is a 53 y.o. year old female with a history of depression, history of PVC, r/o seizure, who presents for follow up appointment for below.   1. Severe episode of recurrent major depressive disorder, without psychotic features (HCC) 2. Anxiety She was originally on bupropion  300 mg daily, fluoxetine  80 mg daily, lurasidone  60 mg daily, lorazepam  1 mg prn at the initial visit. She has been on bupropion  450 mg daily since 06/2016 She reports significant worsening in depressive symptoms and anxiety with SI in the context of experiencing pain/dental procedures.  She was also nonadherent to prescribe medication secondary to nausea related to recent antibiotics use.  There has been a couple of  medication change, including lowering the both dose of fluoxetine ,  bupropion , and adding risperidone .  She agrees to uptitrate bupropion  up to original dose to optimize  treatment for depression given she had significant benefit from this uptitration in the past.  Discussed in details regarding the potential risk of seizure, and she agrees to pursue this.  Noted that she has not had any episode of seizure without antiepileptics in the last few years, and her neurologist did not adjust this medication. Will have caution given possible interaction with fluoxetine  and bupropion . Will  discontinue risperidone  given the potential risk of orthostatic hypotension, and reported gait imbalances.  Will consider uptitration of fluoxetine  after the next visit.  However, only a few medication change will be made at this time.  Will continue BuSpar  for anxiety.  Noted that she reports significant benefit from gabapentin  for hot flashes.  Given this medication can be also beneficial for anxiety, will maintain on the current dose at this time.  She will greatly benefit from [facility]; will make referral.   Noted that although she was diagnosed with bipolar disorder requiring the hospital, she denies any hypomanic/manic symptoms except 2 days of shopping to feel better in the past.  It is also noted that she did not experience any hallucinations, but she states that it was more of her internal thoughts.  Her clinical course is consistent with depression, although this will be monitored closely.   3. Insomnia, unspecified type Overall stable.  Will continue trazodone  as needed for insomnia.   4. High risk medication use There is a concern of adverse reaction of gait disturbances, orthostatic hypotension from Risperdal .  Will discontinue this medication.  Will consider discontinuation of gabapentin  if she continues to experience gait disturbances despite this intervention, as this could be attributable to this medication.  She also agrees that this Clinical research associate will communicate with her primary care given she is also on metoprolol .   # LFT abnormality  According to the chart review, she has LFT abnormalities, although it appears to be peaked out.  Will alert her primary care for further evaluation.      Plan Continue fluoxetine  40 mg daily  Continue bupropion  300 mg daily Discontinue risperidone   (new med) Continue gabapentin  400 mg at night (new med) Continue Buspar  10 mg three times a day  Continue lorazepam  1 mg as needed for anxiety - she rarely takes this medication Continue Trazodone  25-50 mg at  night as needed for insomnia Referral to [facility] Next appointment - 7/11 at 9 am, video  The paperwork for job accomodation was filled out to support she remains out of work for the next six months. It will be faxed by the staff.  Discharged medication- fluoxetine  40 mg daily, bupropion  300 mg daily, buspirone  10 mg TID, risperidone  1 mg at night,  gabapentin  400 mg BID, hydroxyzine  25 mg three times a day, trazodone  50 mg at night as needed for insomnia  - sleep study was not conclusive for sleep apnea in July 2023    Past trials of medication: Cymbalta, Zoloft, Prozac , bupropion , Lithium (sick), lamotrigine  (weight gain), Depakote (spending money), Latuda , Abilify  (did not work), quetiapine (weight gain, somnolence). Geodon  (PVC occurred), Vraylar    The patient demonstrates the following risk factors for suicide: Chronic risk factors for suicide include: psychiatric disorder of depression and previous suicide attempts of overdosing medication. Acute risk factors for suicide include: family or marital conflict. Protective factors for this patient include: responsibility to others (children, family), coping skills and hope for the future. Patient is future oriented and agrees to come for next appointment. Considering these factors, the overall suicide risk at this point  appears to be low. She is future oriented, and is amenable to treatment. Patient is appropriate for outpatient follow up.   Addendum After an extensive chart review, including the neurology notes after visit yesterday, it was deemed appropriate to continue the current dose of bupropion  to avoid any increased risk of seizure, even though she has not experienced any seizures while on this medication. A voice message was left, and a message was also sent through MyChart. She was advised to consider a follow-up with Neurology for further evaluation, as it may help inform future treatment plans.  Collaboration of Care: Collaboration of Care:  Other reviewed notes in Epic, collaboration with her primary care, Cannady Jolene, NP  Patient/Guardian was advised Release of Information must be obtained prior to any record release in order to collaborate their care with an outside provider. Patient/Guardian was advised if they have not already done so to contact the registration department to sign all necessary forms in order for us  to release information regarding their care.   Consent: Patient/Guardian gives verbal consent for treatment and assignment of benefits for services provided during this visit. Patient/Guardian expressed understanding and agreed to proceed.    Katheren Sleet, MD 01/19/2024, 6:50 PM

## 2024-01-20 ENCOUNTER — Encounter: Payer: Self-pay | Admitting: Psychiatry

## 2024-01-22 NOTE — Progress Notes (Signed)
 Virtual Visit via Video Note  I connected with Tracie James on 01/27/24 at  9:00 AM EDT by a video enabled telemedicine application and verified that I am speaking with the correct person using two identifiers.  Location: Patient: work Provider: home office Persons participated in the visit- patient, provider    I discussed the limitations of evaluation and management by telemedicine and the availability of in person appointments. The patient expressed understanding and agreed to proceed.    I discussed the assessment and treatment plan with the patient. The patient was provided an opportunity to ask questions and all were answered. The patient agreed with the plan and demonstrated an understanding of the instructions.   The patient was advised to call back or seek an in-person evaluation if the symptoms worsen or if the condition fails to improve as anticipated.   Katheren Sleet, MD    [facility] MD/PA/NP OP Progress Note  01/27/2024 12:31 PM Tracie James  MRN:  [id number]  Chief Complaint:  Chief Complaint  Patient presents with   Follow-up   HPI:  This is a follow-up appointment for depression, anxiety and insomnia.  She states that she is currently at work.  Although she initially had a rough few days, it has been getting better.  She did approved for FMLA, and she is planning to use it if there is any other episode.  She states that her mood tends to flip back and forth.  Although she feels fine, she is ready to explode and snap in a minute.  She talks about an episode of her feeling mad and mad when she had issues at the house, although it subsided after movement of her boyfriend commented random things.  She denies any episodic mood swing.  She feels anxious, jumpy, and restless.  She also reports loss of her stepmother.  She raised her since she was 53 year old.  She was like another mother.  She sleeps up to 6 hours.  She feels rested most of the time.  Although she feels  depressed, it has been consistent and she denies any worsening.  She denies change in appetite.  She denies SI, HI, hallucinations.  She denies any need to adjust her medication at this time, although she will be open to do so if it is recommended.  She agrees with the plans as outlined below.   Employment: Designer, industrial/product Support: boyfriend of ten years Household: boyfriend of 11 years in 2022 Marital status: divorced Number of children: 2 daughters, oldest is 35 year old, will move to [place]     Substance use   Tobacco Alcohol Other substances/  Current  denies denies Vape THC a few times per day- since she was recommended by her daughter  Past        Past Treatment           Visit Diagnosis:    ICD-10-CM   1. Severe episode of recurrent major depressive disorder, without psychotic features (HCC)  F33.2     2. Anxiety  F41.9     3. Insomnia, unspecified type  G47.00     4. High risk medication use  Z79.899       Past Psychiatric History: Please see initial evaluation for full details. I have reviewed the history. No updates at this time.     Past Medical History:  Past Medical History:  Diagnosis Date   Anxiety    Asthma    Bipolar 2 disorder (HCC)    Depression  GERD (gastroesophageal reflux disease)    Palpitations    PVC (premature ventricular contraction)     Past Surgical History:  Procedure Laterality Date   APPENDECTOMY     TOTAL ABDOMINAL HYSTERECTOMY      Family Psychiatric History: Please see initial evaluation for full details. I have reviewed the history. No updates at this time.     Family History:  Family History  Problem Relation Age of Onset   Depression Mother    Multiple sclerosis Mother    Schizophrenia Maternal Grandmother    Heart disease Maternal Grandmother        aortic valve replacement    Social History:  Social History   Socioeconomic History   Marital status: Divorced    Spouse name: Not on file   Number of children: Not on file    Years of education: Not on file   Highest education level: Not on file  Occupational History   Not on file  Tobacco Use   Smoking status: Former    Current packs/day: 0.00    Types: Cigarettes    Quit date: 03/31/2014    Years since quitting: 9.8   Smokeless tobacco: Never  Vaping Use   Vaping status: Every Day  Substance and Sexual Activity   Alcohol use: No   Drug use: No   Sexual activity: Yes    Partners: Male    Birth control/protection: Surgical  Other Topics Concern   Not on file  Social History Narrative   Not on file   Social Drivers of Health   Financial Resource Strain: Not on file  Food Insecurity: No Food Insecurity (01/09/2024)   Hunger Vital Sign    Worried About Running Out of Food in the Last Year: Never true    Ran Out of Food in the Last Year: Never true  Transportation Needs: No Transportation Needs (01/09/2024)   PRAPARE - Administrator, Civil Service (Medical): No    Lack of Transportation (Non-Medical): No  Physical Activity: Not on file  Stress: Not on file  Social Connections: Socially Integrated (01/09/2024)   Social Connection and Isolation Panel    Frequency of Communication with Friends and Family: Twice a week    Frequency of Social Gatherings with Friends and Family: Twice a week    Attends Religious Services: More than 4 times per year    Active Member of [employer] or Organizations: Patient declined    Attends Banker Meetings: 1 to 4 times per year    Marital Status: Married    Allergies:  Allergies  Allergen Reactions   Erythromycin Hives   Tetracyclines & Related Hives    Metabolic Disorder Labs: Lab Results  Component Value Date   HGBA1C 5.0 01/11/2024   MPG 96.8 01/11/2024   No results found for: PROLACTIN Lab Results  Component Value Date   CHOL 230 (H) 01/13/2024   TRIG 120 01/13/2024   HDL 58 01/13/2024   CHOLHDL 4.0 01/13/2024   VLDL 24 01/13/2024   LDLCALC 148 (H) 01/13/2024   LDLCALC  218 (H) 01/11/2024   Lab Results  Component Value Date   TSH 2.284 01/11/2024   TSH 1.110 11/04/2023    Therapeutic Level Labs: No results found for: LITHIUM No results found for: VALPROATE No results found for: CBMZ  Current Medications: Current Outpatient Medications  Medication Sig Dispense Refill   [START ON 02/14/2024] buPROPion  (WELLBUTRIN  XL) 300 MG 24 hr tablet Take 1 tablet (300 mg total)  by mouth daily. 30 tablet 3   [START ON 02/14/2024] busPIRone  (BUSPAR ) 10 MG tablet Take 1 tablet (10 mg total) by mouth 3 (three) times daily. 90 tablet 0   cholecalciferol  (CHOLECALCIFEROL ) 25 MCG tablet Take 5 tablets (5,000 Units total) by mouth daily. 30 tablet 0   ferrous sulfate  325 (65 FE) MG tablet Take 1 tablet (325 mg total) by mouth daily with breakfast. 30 tablet 3   FLUoxetine  (PROZAC ) 40 MG capsule Take 1 capsule (40 mg total) by mouth daily. 30 capsule 3   furosemide  (LASIX ) 20 MG tablet Take 1 tablet (20 mg total) by mouth daily. 30 tablet 3   [START ON 02/22/2024] gabapentin  (NEURONTIN ) 300 MG capsule Take 1 capsule (300 mg total) by mouth at bedtime. 30 capsule 0   hydrOXYzine  (ATARAX ) 25 MG tablet Take 1 tablet (25 mg total) by mouth 3 (three) times daily as needed for anxiety. 30 tablet 0   metoprolol  succinate (TOPROL -XL) 50 MG 24 hr tablet Take 1 tablet (50 mg total) by mouth daily. Take with or immediately following a meal. 30 tablet 0   pantoprazole  (PROTONIX ) 40 MG tablet Take 1 tablet (40 mg total) by mouth at bedtime for 14 days. (Patient not taking: Reported on 01/19/2024) 14 tablet 0   [START ON 02/13/2024] traZODone  (DESYREL ) 50 MG tablet Take 1 tablet (50 mg total) by mouth at bedtime as needed for sleep. 30 tablet 0   No current facility-administered medications for this visit.     Musculoskeletal: Strength & Muscle Tone: N/A Gait & Station: N/A Patient leans: N/A  Psychiatric Specialty Exam: Review of Systems  Psychiatric/Behavioral:  Positive for  decreased concentration, dysphoric mood and sleep disturbance. Negative for agitation, behavioral problems, confusion, hallucinations, self-injury and suicidal ideas. The patient is nervous/anxious. The patient is not hyperactive.   All other systems reviewed and are negative.   There were no vitals taken for this visit.There is no height or weight on file to calculate BMI.  General Appearance: Well Groomed  Eye Contact:  Good  Speech:  Clear and Coherent  Volume:  Normal  Mood:  fine  Affect:  Appropriate, Congruent, and calm, slightly down  Thought Process:  Coherent  Orientation:  Full (Time, Place, and Person)  Thought Content: Logical   Suicidal Thoughts:  No  Homicidal Thoughts:  No  Memory:  Immediate;   Good  Judgement:  Good  Insight:  Good  Psychomotor Activity:  Normal  Concentration:  Concentration: Good and Attention Span: Good  Recall:  Good  Fund of Knowledge: Good  Language: Good  Akathisia:  No  Handed:  Right  AIMS (if indicated): not done  Assets:  Communication Skills Desire for Improvement  ADL's:  Intact  Cognition: WNL  Sleep:  Fair   Screenings: AUDIT    Flowsheet Row [facility]  Alcohol Use Disorder Identification Test Final Score (AUDIT) 0   GAD-7    Flowsheet Row Office Visit from 01/19/2024 in [facility] Office Visit from 11/04/2023 in [facility] Office Visit from 08/12/2023 in [facility] Office Visit from 06/23/2023 in [facility] Office Visit from 04/25/2023 in [facility]  Total GAD-7 Score 21 21 21 21 18    PHQ2-9    Flowsheet Row Office Visit from 01/19/2024 in [facility] Office Visit from 11/04/2023 in [facility]  [facility] Practice Office Visit from 08/12/2023 in [facility]  [facility] Practice Office Visit from 06/23/2023 in [facility] Office Visit from 04/25/2023 in [facility]  PHQ-2 Total Score 5 2 3 3 3   PHQ-9 Total Score 24 18 11 13 11    Flowsheet Row Office Visit from 01/19/2024 in [facility] Most recent reading at 01/19/2024  6:49 PM Admission (Discharged) [facility] Most recent reading at 01/09/2024 10:29 PM ED from 01/09/2024 in [facility] Most recent reading at 01/09/2024 10:40 AM  C-SSRS RISK CATEGORY Error: Q7 should not be populated when Q6 is No Moderate Risk Moderate Risk     Assessment and Plan:  Tracie James is a 53 y.o. year old female with a history of depression, history of PVC, r/o seizure, who presents for follow up appointment for below.   1. Severe episode of recurrent major depressive disorder, without psychotic features (HCC) 2. Anxiety She was originally on bupropion  300 mg daily, fluoxetine  80 mg daily, lurasidone  60 mg daily, lorazepam  1 mg prn at the initial visit. She has been on bupropion  450 mg daily since 06/2016 She reports a mild exacerbation of anxiety symptoms, accompanied by moment-to-moment mood fluctuations in the context of loss of her step mother, while depressive symptoms are relatively stable. Symptoms have become sufficiently manageable to allow a return to work while the [employer] application remains in process.  Although she may benefit from adjunctive treatment for depression, concern remains regarding a possible seizure disorder for which she has not yet completed evaluation. She has expressed willingness to reconnect with her neurologist to further assess this issue. In the interim, she will continue her current medication regimen, particularly as she prepares to initiate participation in [facility].  Noted that risperdal  was discontinued/gabapentin  was  reduced due to concern of gait disturbances, and there has been improvement since this change.  Will continue fluoxetine  to target depression and anxiety.  Will continue bupropion  adjunctive treatment for depression while monitoring medication interaction with fluoxetine .  She is aware of increased risk of seizure.  Will continue BuSpar  for anxiety.  Will continue gabapentin  for anxiety, and hot flashes.   Although the patient received a diagnosis of bipolar disorder during her recent admission, she denies any significant history of hypomanic or manic episodes, with the exception of a brief two-day period of shopping to elevate mood. She also denies experiencing hallucinations, clarifying that her experiences were more aligned with internal thoughts rather than perceptual disturbances. Her overall clinical presentation is more consistent with depressive symptoms, and ongoing monitoring is recommended to further assess diagnostic clarity.   3. Insomnia, unspecified type Overall improving.  Will continue trazodone  as needed for insomnia.   4. High risk medication use As outlined above, she will reconnect with her neurologist for evaluation of seizure disorder.    # LFT abnormality  According to the chart review, she has LFT abnormalities, although it appears to be peaked out.  Primary care was notified of this, and she will have a follow up.   # THC use The patient has been using THC a few times daily, since her daughter recommended it. She reports improvements in mood and mental clarity. However, since being informed of potential risks associated with continued use, she denies any difficulty with abstinence, and has agreed to discontinue use.      Plan Continue fluoxetine  40 mg daily  Continue bupropion  300 mg daily Continue gabapentin  300 mg at night (possible gait disturbance from higher dose Continue Buspar  10 mg three times a day  Continue lorazepam  1 mg as needed for anxiety - she rarely  takes this medication Continue Trazodone  25-50 mg at night as needed for insomnia Next appointment - 8/22 at 10 am, video- waitlist for sooner appointment    Discharged medication- fluoxetine  40 mg daily, bupropion  300 mg daily, buspirone  10 mg TID, risperidone  1 mg at night,  gabapentin  400 mg BID, hydroxyzine  25 mg three times a day, trazodone  50 mg at night as needed for insomnia   - sleep study was not conclusive for sleep apnea in July 2023    Past trials of medication: sertraline, fluoxetine , bupropion , duloxetine,, Lithium (sick), lamotrigine  (weight gain), Depakote (spending money), Latuda , Abilify  (did not work), quetiapine (weight gain, somnolence). Geodon  (PVC occurred), Vraylar    The patient demonstrates the following risk factors for suicide: Chronic risk factors for suicide include: psychiatric disorder of depression and previous suicide attempts of overdosing medication. Acute risk factors for suicide include: family or marital conflict. Protective factors for this patient include: responsibility to others (children, family), coping skills and hope for the future. Patient is future oriented and agrees to come for next appointment. Considering these factors, the overall suicide risk at this point appears to be low. She is future oriented, and is amenable to treatment. Patient is appropriate for outpatient follow up.  Collaboration of Care: Collaboration of Care: Other reviewed notes in Epic  Patient/Guardian was advised Release of Information must be obtained prior to any record release in order to collaborate their care with an outside provider. Patient/Guardian was advised if they have not already done so to contact the registration department to sign all necessary forms in order for us  to release information regarding their care.   Consent: Patient/Guardian gives verbal consent for treatment and assignment of benefits for services provided during this visit. Patient/Guardian  expressed understanding and agreed to proceed.    Katheren Sleet, MD 01/27/2024, 12:31 PM

## 2024-01-23 ENCOUNTER — Telehealth (HOSPITAL_COMMUNITY): Payer: Self-pay | Admitting: Professional

## 2024-01-23 MED ORDER — GABAPENTIN 300 MG PO CAPS: 300.0000 mg | ORAL_CAPSULE | Freq: Every day | ORAL | 0 refills | Status: DC

## 2024-01-23 NOTE — Telephone Encounter (Signed)
 Called her in response to [employer].   Her boyfriend mentioned that her gait is still unsteady despite discontinuation of risperidone . She agreed to a dose reduction and expressed understanding about the importance of reaching out to neurology for further evaluation of possible seizure activity. For now, she will remain on the reduced dose of bupropion .  Reduce gabapentin  to 300 mg at night. Continue bupropion  300 mg daily, along with her other prescribed medications.

## 2024-01-23 NOTE — Telephone Encounter (Signed)
 See call log

## 2024-01-24 ENCOUNTER — Telehealth: Payer: Self-pay | Admitting: Psychiatry

## 2024-01-24 ENCOUNTER — Other Ambulatory Visit: Payer: Self-pay | Admitting: Psychiatry

## 2024-01-24 ENCOUNTER — Telehealth: Payer: Self-pay

## 2024-01-24 ENCOUNTER — Ambulatory Visit: Admitting: Pulmonary Disease

## 2024-01-24 NOTE — Telephone Encounter (Signed)
 Noted.   Appt has been canceled.  Nothing further needed.

## 2024-01-24 NOTE — Telephone Encounter (Signed)
 Patient is considered low risk as she does not have a smoking history.  The nodule is very very small.  This can be followed up by her primary care provider and referred to our lung nodule clinic as needed.  I will convey this to her primary care provider.  She may cancel the appointment.

## 2024-01-24 NOTE — Telephone Encounter (Signed)
 Copied from CRM [id number]. Topic: Appointments - Appointment Cancel/Reschedule >> Jan 23, 2024  3:18 PM Rilla B wrote: Patient called to cancel appt. States the hospital booked her appt wrong. She states she needs a psychiatrist, there's nothing wrong with her lungs, she says she breaths just fine.   Insisted on canceling appt (Dr Lenda 7/08)she thanked me and was gone.  Appt notes says she has lung noodle and I was NOT going to tell her that. I think patient needs a phone call to discuss why she was referred to pulmonary.   ----------------------------------------------------------------------- From previous Reason for Contact - Medical Advice: Reason for CRM:

## 2024-01-24 NOTE — Telephone Encounter (Signed)
 FMLA paperwork faxed and put in scan box to be scanned in patient's chart

## 2024-01-24 NOTE — Telephone Encounter (Signed)
 Per CT ABDOMEN AND PELVIS WITH CONTRAST results- 3. 4 mm left lower lobe pulmonary nodule. No follow-up needed if patient is low-risk. Non-contrast chest CT can be considered in 12 months if patient is high-risk. This recommendation follows the consensus statement: Guidelines for Management of Incidental Pulmonary Nodules Detected on CT Images: From the Fleischner Society  Dr. Tamea does the patient need to follow up?

## 2024-01-26 ENCOUNTER — Encounter: Payer: Self-pay | Admitting: Nurse Practitioner

## 2024-01-26 DIAGNOSIS — D696 Thrombocytopenia, unspecified: Secondary | ICD-10-CM

## 2024-01-26 DIAGNOSIS — R7989 Other specified abnormal findings of blood chemistry: Secondary | ICD-10-CM

## 2024-01-26 DIAGNOSIS — M25472 Effusion, left ankle: Secondary | ICD-10-CM

## 2024-01-27 ENCOUNTER — Telehealth (INDEPENDENT_AMBULATORY_CARE_PROVIDER_SITE_OTHER): Admitting: Psychiatry

## 2024-01-27 ENCOUNTER — Telehealth: Admitting: Psychiatry

## 2024-01-27 ENCOUNTER — Encounter: Payer: Self-pay | Admitting: Psychiatry

## 2024-01-27 DIAGNOSIS — F419 Anxiety disorder, unspecified: Secondary | ICD-10-CM | POA: Diagnosis not present

## 2024-01-27 DIAGNOSIS — G4700 Insomnia, unspecified: Secondary | ICD-10-CM

## 2024-01-27 DIAGNOSIS — Z79899 Other long term (current) drug therapy: Secondary | ICD-10-CM | POA: Diagnosis not present

## 2024-01-27 DIAGNOSIS — F332 Major depressive disorder, recurrent severe without psychotic features: Secondary | ICD-10-CM

## 2024-01-27 MED ORDER — TRAZODONE HCL 50 MG PO TABS: 50.0000 mg | ORAL_TABLET | Freq: Every evening | ORAL | 0 refills | Status: DC | PRN

## 2024-01-27 MED ORDER — BUPROPION HCL ER (XL) 300 MG PO TB24: 300.0000 mg | ORAL_TABLET | Freq: Every day | ORAL | 3 refills | Status: DC

## 2024-01-27 MED ORDER — FUROSEMIDE 20 MG PO TABS: 20.0000 mg | ORAL_TABLET | Freq: Every day | ORAL | 3 refills | Status: DC

## 2024-01-27 MED ORDER — GABAPENTIN 300 MG PO CAPS: 300.0000 mg | ORAL_CAPSULE | Freq: Every day | ORAL | 0 refills | Status: DC

## 2024-01-27 MED ORDER — BUSPIRONE HCL 10 MG PO TABS: 10.0000 mg | ORAL_TABLET | Freq: Three times a day (TID) | ORAL | 0 refills | Status: DC

## 2024-01-27 NOTE — Addendum Note (Signed)
 Addended by: Nadalee Neiswender T on: 01/27/2024 08:12 AM   Modules accepted: Orders

## 2024-01-30 ENCOUNTER — Telehealth: Payer: Self-pay

## 2024-01-30 NOTE — Telephone Encounter (Signed)
 left message asking for a call back to the office.

## 2024-01-30 NOTE — Telephone Encounter (Signed)
 requesting medication refill for the hydroxyzine  pt was last seen on 7-11 next appt 8-22

## 2024-01-30 NOTE — Telephone Encounter (Signed)
 Could you ask her if she needs this refill?

## 2024-02-01 ENCOUNTER — Other Ambulatory Visit: Payer: Self-pay | Admitting: Psychiatry

## 2024-02-01 MED ORDER — HYDROXYZINE HCL 25 MG PO TABS
25.0000 mg | ORAL_TABLET | Freq: Two times a day (BID) | ORAL | 0 refills | Status: AC | PRN
Start: 2024-02-01 — End: 2024-03-02

## 2024-02-15 ENCOUNTER — Encounter: Payer: Self-pay | Admitting: Psychiatry

## 2024-02-15 ENCOUNTER — Telehealth: Admitting: Psychiatry

## 2024-02-15 DIAGNOSIS — G4700 Insomnia, unspecified: Secondary | ICD-10-CM

## 2024-02-15 DIAGNOSIS — F331 Major depressive disorder, recurrent, moderate: Secondary | ICD-10-CM

## 2024-02-15 DIAGNOSIS — F419 Anxiety disorder, unspecified: Secondary | ICD-10-CM | POA: Diagnosis not present

## 2024-02-15 NOTE — Patient Instructions (Signed)
 Continue fluoxetine  40 mg daily  Continue bupropion  300 mg daily Continue gabapentin  300 mg at night  Continue Buspar  10 mg three times a day  Continue hydroxyzine  25 mg twice a day as needed for anxiety Continue lorazepam  1 mg as needed for anxiety  Continue Trazodone  25-50 mg at night as needed for insomnia Next appointment - 8/22 at 10 am

## 2024-02-15 NOTE — Progress Notes (Signed)
 Virtual Visit via Video Note  I connected with Tracie James on 02/15/24 at 11:30 AM EDT by a video enabled telemedicine application and verified that I am speaking with the correct person using two identifiers.  Location: Patient: work Provider: home office Persons participated in the visit- patient, provider    I discussed the limitations of evaluation and management by telemedicine and the availability of in person appointments. The patient expressed understanding and agreed to proceed.   I discussed the assessment and treatment plan with the patient. The patient was provided an opportunity to ask questions and all were answered. The patient agreed with the plan and demonstrated an understanding of the instructions.   The patient was advised to call back or seek an in-person evaluation if the symptoms worsen or if the condition fails to improve as anticipated.    Katheren Sleet, MD    [facility] MD/PA/NP OP Progress Note  02/15/2024 11:56 AM Bailie Christenbury  MRN:  [id number]  Chief Complaint:  Chief Complaint  Patient presents with   Follow-up   HPI:  This is a follow-up appointment for depression, anxiety and insomnia.  She states that the work has been busy.  She has been working since 8th. She works from 10-14 hours.  It has been manageable as she has been feeling better.  She has not spoken with her ex-husband since the loss of her stepmother, who died from pancreatitis.  She states that it has been okay.  She has been communicating with her daughter.  She asked if she wants to come for Christmas.  She thinks her depression is good.  Her sleep has been okay, and that did not require taking higher dose of trazodone .  She thinks her anxiety has been also better.  She has not taken lorazepam  or hydroxyzine .  She denies SI, HI, hallucinations.  Although she feels fatigued, she attributes this to work.  She feels comfortable to stay on the current medication regimen at this time.    Employment: Designer, industrial/product Support: boyfriend of ten years Household: boyfriend of 11 years in 2022 Marital status: divorced Number of children: 2 daughters, oldest is 89 year old, will move to [place]     Substance use   Tobacco Alcohol Other substances/  Current  denies denies Denies since July 2025  Past      Vape THC a few times per day- since she was recommended by her daughter  Past Treatment           Visit Diagnosis:    ICD-10-CM   1. MDD (major depressive disorder), recurrent episode, moderate (HCC)  F33.1     2. Anxiety  F41.9     3. Insomnia, unspecified type  G47.00       Past Psychiatric History: Please see initial evaluation for full details. I have reviewed the history. No updates at this time.     Past Medical History:  Past Medical History:  Diagnosis Date   Anxiety    Asthma    Bipolar 2 disorder (HCC)    Depression    GERD (gastroesophageal reflux disease)    Palpitations    PVC (premature ventricular contraction)     Past Surgical History:  Procedure Laterality Date   APPENDECTOMY     TOTAL ABDOMINAL HYSTERECTOMY      Family Psychiatric History: Please see initial evaluation for full details. I have reviewed the history. No updates at this time.     Family History:  Family History  Problem  Relation Age of Onset   Depression Mother    Multiple sclerosis Mother    Schizophrenia Maternal Grandmother    Heart disease Maternal Grandmother        aortic valve replacement    Social History:  Social History   Socioeconomic History   Marital status: Divorced    Spouse name: Not on file   Number of children: Not on file   Years of education: Not on file   Highest education level: Not on file  Occupational History   Not on file  Tobacco Use   Smoking status: Former    Current packs/day: 0.00    Types: Cigarettes    Quit date: 03/31/2014    Years since quitting: 9.8   Smokeless tobacco: Never  Vaping Use   Vaping status: Every Day   Substance and Sexual Activity   Alcohol use: No   Drug use: No   Sexual activity: Yes    Partners: Male    Birth control/protection: Surgical  Other Topics Concern   Not on file  Social History Narrative   Not on file   Social Drivers of Health   Financial Resource Strain: Not on file  Food Insecurity: No Food Insecurity (01/09/2024)   Hunger Vital Sign    Worried About Running Out of Food in the Last Year: Never true    Ran Out of Food in the Last Year: Never true  Transportation Needs: No Transportation Needs (01/09/2024)   PRAPARE - Administrator, Civil Service (Medical): No    Lack of Transportation (Non-Medical): No  Physical Activity: Not on file  Stress: Not on file  Social Connections: Socially Integrated (01/09/2024)   Social Connection and Isolation Panel    Frequency of Communication with Friends and Family: Twice a week    Frequency of Social Gatherings with Friends and Family: Twice a week    Attends Religious Services: More than 4 times per year    Active Member of [employer] or Organizations: Patient declined    Attends Banker Meetings: 1 to 4 times per year    Marital Status: Married    Allergies:  Allergies  Allergen Reactions   Erythromycin Hives   Tetracyclines & Related Hives    Metabolic Disorder Labs: Lab Results  Component Value Date   HGBA1C 5.0 01/11/2024   MPG 96.8 01/11/2024   No results found for: PROLACTIN Lab Results  Component Value Date   CHOL 230 (H) 01/13/2024   TRIG 120 01/13/2024   HDL 58 01/13/2024   CHOLHDL 4.0 01/13/2024   VLDL 24 01/13/2024   LDLCALC 148 (H) 01/13/2024   LDLCALC 218 (H) 01/11/2024   Lab Results  Component Value Date   TSH 2.284 01/11/2024   TSH 1.110 11/04/2023    Therapeutic Level Labs: No results found for: LITHIUM No results found for: VALPROATE No results found for: CBMZ  Current Medications: Current Outpatient Medications  Medication Sig Dispense Refill    buPROPion  (WELLBUTRIN  XL) 300 MG 24 hr tablet Take 1 tablet (300 mg total) by mouth daily. 30 tablet 3   busPIRone  (BUSPAR ) 10 MG tablet Take 1 tablet (10 mg total) by mouth 3 (three) times daily. 90 tablet 0   cholecalciferol  (CHOLECALCIFEROL ) 25 MCG tablet Take 5 tablets (5,000 Units total) by mouth daily. 30 tablet 0   ferrous sulfate  325 (65 FE) MG tablet Take 1 tablet (325 mg total) by mouth daily with breakfast. 30 tablet 3   FLUoxetine  (PROZAC ) 40  MG capsule Take 1 capsule (40 mg total) by mouth daily. 30 capsule 3   furosemide  (LASIX ) 20 MG tablet Take 1 tablet (20 mg total) by mouth daily. 30 tablet 3   [START ON 02/22/2024] gabapentin  (NEURONTIN ) 300 MG capsule Take 1 capsule (300 mg total) by mouth at bedtime. 30 capsule 0   hydrOXYzine  (ATARAX ) 25 MG tablet Take 1 tablet (25 mg total) by mouth 2 (two) times daily as needed for anxiety. 60 tablet 0   metoprolol  succinate (TOPROL -XL) 50 MG 24 hr tablet Take 1 tablet (50 mg total) by mouth daily. Take with or immediately following a meal. 30 tablet 0   pantoprazole  (PROTONIX ) 40 MG tablet Take 1 tablet (40 mg total) by mouth at bedtime for 14 days. (Patient not taking: Reported on 01/19/2024) 14 tablet 0   traZODone  (DESYREL ) 50 MG tablet Take 1 tablet (50 mg total) by mouth at bedtime as needed for sleep. 30 tablet 0   No current facility-administered medications for this visit.     Musculoskeletal: Strength & Muscle Tone: N/A Gait & Station: N/A Patient leans: N/A  Psychiatric Specialty Exam: Review of Systems  Psychiatric/Behavioral:  Negative for agitation, behavioral problems, confusion, decreased concentration, dysphoric mood, hallucinations, self-injury, sleep disturbance and suicidal ideas. The patient is not nervous/anxious and is not hyperactive.   All other systems reviewed and are negative.   There were no vitals taken for this visit.There is no height or weight on file to calculate BMI.  General Appearance: Well Groomed   Eye Contact:  Good  Speech:  Clear and Coherent  Volume:  Normal  Mood:  good  Affect:  Appropriate, Congruent, and bright  Thought Process:  Coherent  Orientation:  Full (Time, Place, and Person)  Thought Content: Logical   Suicidal Thoughts:  No  Homicidal Thoughts:  No  Memory:  Immediate;   Good  Judgement:  Good  Insight:  Good  Psychomotor Activity:  Normal  Concentration:  Concentration: Good and Attention Span: Good  Recall:  Good  Fund of Knowledge: Good  Language: Good  Akathisia:  No  Handed:  Right  AIMS (if indicated): not done  Assets:  Communication Skills Desire for Improvement  ADL's:  Intact  Cognition: WNL  Sleep:  Good   Screenings: AUDIT    Flowsheet Row [facility]  Alcohol Use Disorder Identification Test Final Score (AUDIT) 0   GAD-7    Flowsheet Row Office Visit from 01/19/2024 in [facility] Office Visit from 11/04/2023 in [facility] Office Visit from 08/12/2023 in [facility] Office Visit from 06/23/2023 in [facility] Office Visit from 04/25/2023 in [facility]  Total GAD-7 Score 21 21 21 21 18    PHQ2-9    Flowsheet Row Office Visit from 01/19/2024 in [facility] Office Visit from 11/04/2023 in [facility] Office Visit from 08/12/2023 in [facility] Office Visit from 06/23/2023 in [facility] Office Visit from 04/25/2023 in [facility]  PHQ-2 Total Score 5 2 3 3 3   PHQ-9 Total Score 24 18 11 13 11    Flowsheet Row Office Visit from 01/19/2024 in [facility] Most recent reading at 01/19/2024  6:49 PM [facility] ([facility] Most  recent reading at 01/09/2024 10:29 PM ED from 01/09/2024 in [facility] Most recent reading at 01/09/2024 10:40 AM  C-SSRS RISK CATEGORY Error: Q7 should not be populated when Q6 is No Moderate Risk Moderate Risk     Assessment and Plan:  Tracie James is a 53 y.o. year old female with a history of depression, history of PVC, r/o seizure, who presents for follow up appointment for below.   1. MDD (major depressive disorder), recurrent episode, moderate (HCC) 2. Anxiety She was originally on bupropion  300 mg daily, fluoxetine  80 mg daily, lurasidone  60 mg daily, lorazepam  1 mg prn at the initial visit. She has been on bupropion  450 mg daily since 06/2016. [facility] admission in 12/2023 with SI with thought to shoot herself in the head, OD medication. The exam is notable for brighter affect, and she reports steady improvement in her mood symptoms.  Although she recently suffered the loss of her stepmother, who suffered from pancreatitis, she denies much concern about the loss.  She has returned to work, while [employer] application was admitted to allow her if any relapse in her mood symptoms.  She will be also reaching out to the staff for PHP.  Will maintain on the current medication regimen.  Will continue fluoxetine  to target depression and anxiety.  Will continue bupropion  as adjunctive treatment for depression, while monitoring any medication interaction.  She is aware of increased risk of seizure.  Will continue BuSpar  for anxiety, and gabapentin  for anxiety and hot flashes.  While she has as needed hydroxyzine  and lorazepam , she has not needed either of the medication lately.   Noted that although the patient received a diagnosis of bipolar disorder during her recent admission, she denies any significant history of hypomanic or manic episodes, with the exception of a brief two-day period of shopping to elevate mood. She also denies  experiencing hallucinations, clarifying that her experiences were more aligned with internal thoughts rather than perceptual disturbances. Her overall clinical presentation is more consistent with depressive symptoms, and ongoing monitoring is recommended to further assess diagnostic clarity.  3. Insomnia, unspecified type - sleep study was not conclusive for sleep apnea in July 2023  Overall improving.  Will continue trazodone  as needed for insomnia.    4. High risk medication use There is a concern of history of seizure.  She will have an appointment in January and is on the waiting list to see a neurologist.    # LFT abnormality  According to the chart review, she has LFT abnormalities, although it appears to be peaked out.  Primary care was notified of this, and she will have a follow up.    # THC use She has been abstinent from [facility] use.  Will continue motivational interview.      Plan Continue fluoxetine  40 mg daily  Continue bupropion  300 mg daily Continue gabapentin  300 mg at night (possible gait disturbance from higher dose Continue Buspar  10 mg three times a day  Continue hydroxyzine  25 mg twice a day as needed for anxiety- she declined a refill Continue lorazepam  1 mg as needed for anxiety - she rarely takes this medication Continue Trazodone  25-50 mg at night as needed for insomnia Next appointment - 8/22 at 10 am, video- waitlist for sooner appointment   Past trials of medication: sertraline, fluoxetine , bupropion , duloxetine,, Lithium (sick), lamotrigine  (weight gain), Depakote (spending money), Latuda , Abilify  (did not work), quetiapine (weight gain, somnolence). Geodon  (PVC occurred), Vraylar   The patient demonstrates the following risk factors for suicide: Chronic risk factors for suicide include: psychiatric disorder of depression and previous suicide attempts of overdosing medication. Acute risk factors for suicide include: family or marital conflict. Protective factors  for this patient include: responsibility to others (children, family), coping skills and hope for the future. Patient is future oriented and agrees to come for next appointment. Considering these factors, the overall suicide risk at this point appears to be low. She is future oriented, and is amenable to treatment. Patient is appropriate for outpatient follow up.  Collaboration of Care: Collaboration of Care: Other reviewed notes in Epic  Patient/Guardian was advised Release of Information must be obtained prior to any record release in order to collaborate their care with an outside provider. Patient/Guardian was advised if they have not already done so to contact the registration department to sign all necessary forms in order for us  to release information regarding their care.   Consent: Patient/Guardian gives verbal consent for treatment and assignment of benefits for services provided during this visit. Patient/Guardian expressed understanding and agreed to proceed.    Katheren Sleet, MD 02/15/2024, 11:56 AM

## 2024-02-17 ENCOUNTER — Other Ambulatory Visit: Payer: Self-pay | Admitting: Psychiatry

## 2024-02-21 ENCOUNTER — Telehealth (HOSPITAL_COMMUNITY): Payer: Self-pay | Admitting: Psychiatry

## 2024-02-21 NOTE — Telephone Encounter (Signed)
 D: Returned phone call to pt.  Pt left vm stating she had spoken to [facility] back in July about PHP but she wanted to check with her employer first.  According to pt, she has spoken to the employer and can attend but wanted more information.  A:  Case Manager answered all of pt's questions and scheduled CCA for 02-28-24 @ 1pm.  Her email is [email or web address].  Will inform the PHP team.  R:  Pt receptive.

## 2024-02-27 ENCOUNTER — Other Ambulatory Visit: Payer: Self-pay | Admitting: Psychiatry

## 2024-02-28 ENCOUNTER — Telehealth (HOSPITAL_COMMUNITY): Payer: Self-pay | Admitting: Professional

## 2024-02-28 ENCOUNTER — Other Ambulatory Visit (HOSPITAL_COMMUNITY)

## 2024-02-28 NOTE — Telephone Encounter (Signed)
 See call log

## 2024-03-01 NOTE — Progress Notes (Unsigned)
 Virtual Visit via Video Note  I connected with Tracie James on 03/09/24 at 10:00 AM EDT by a video enabled telemedicine application and verified that I am speaking with the correct person using two identifiers.  Location: Patient: car Provider: home office Persons participated in the visit- patient, provider    I discussed the limitations of evaluation and management by telemedicine and the availability of in person appointments. The patient expressed understanding and agreed to proceed.  I discussed the assessment and treatment plan with the patient. The patient was provided an opportunity to ask questions and all were answered. The patient agreed with the plan and demonstrated an understanding of the instructions.   The patient was advised to call back or seek an in-person evaluation if the symptoms worsen or if the condition fails to improve as anticipated.  Katheren Sleet, MD    [facility] MD/PA/NP OP Progress Note  03/09/2024 10:29 AM Tracie James  MRN:  [id number]  Chief Complaint:  Chief Complaint  Patient presents with   Follow-up   HPI:  This is a follow-up appointment for depression, anxiety, insomnia.  She states that things at work is no better, and no worse.  She is struggling with concentration.  She tends to get distracted.  She attributes this to learning new things, although she did not have any issues at the previous work.  Her mind is busy, thinking about how much number before she is able to get out of the work.  She tends to feel anxious for about an hour after get back home.  She denies any concern on days she does not work.  She continues to have a good relationship with her significant other.  She communicates with Maurilio.  Although she feels far away, it is a good to know that she will contact her.  She has slight difficulty in sleep due to hot flashes.  She agrees to discuss this with her primary care provider.  She denies change in appetite.  She has occasional down  mood, although she denies much concern.  She denies SI, HI, hallucinations.  She agrees with the plans as outlined below.   Employment: Designer, industrial/product Support: boyfriend of ten years Household: boyfriend of 11 years in 2022 Marital status: divorced Number of children: 2 daughters, oldest is 75 year old, will move to [place]     Substance use   Tobacco Alcohol Other substances/  Current  denies denies Denies since July 2025  Past      Vape THC a few times per day- since she was recommended by her daughter  Past Treatment           Visit Diagnosis:    ICD-10-CM   1. MDD (major depressive disorder), recurrent episode, mild (HCC)  F33.0     2. Anxiety disorder, unspecified type  F41.9     3. Insomnia, unspecified type  G47.00       Past Psychiatric History: Please see initial evaluation for full details. I have reviewed the history. No updates at this time.     Past Medical History:  Past Medical History:  Diagnosis Date   Anxiety    Asthma    Bipolar 2 disorder (HCC)    Depression    GERD (gastroesophageal reflux disease)    Palpitations    PVC (premature ventricular contraction)     Past Surgical History:  Procedure Laterality Date   APPENDECTOMY     TOTAL ABDOMINAL HYSTERECTOMY      Family  Psychiatric History: Please see initial evaluation for full details. I have reviewed the history. No updates at this time.     Family History:  Family History  Problem Relation Age of Onset   Depression Mother    Multiple sclerosis Mother    Schizophrenia Maternal Grandmother    Heart disease Maternal Grandmother        aortic valve replacement    Social History:  Social History   Socioeconomic History   Marital status: Divorced    Spouse name: Not on file   Number of children: Not on file   Years of education: Not on file   Highest education level: Not on file  Occupational History   Not on file  Tobacco Use   Smoking status: Former    Current packs/day: 0.00    Types:  Cigarettes    Quit date: 03/31/2014    Years since quitting: 9.9   Smokeless tobacco: Never  Vaping Use   Vaping status: Every Day  Substance and Sexual Activity   Alcohol use: No   Drug use: No   Sexual activity: Yes    Partners: Male    Birth control/protection: Surgical  Other Topics Concern   Not on file  Social History Narrative   Not on file   Social Drivers of Health   Financial Resource Strain: Not on file  Food Insecurity: No Food Insecurity (01/09/2024)   Hunger Vital Sign    Worried About Running Out of Food in the Last Year: Never true    Ran Out of Food in the Last Year: Never true  Transportation Needs: No Transportation Needs (01/09/2024)   PRAPARE - Administrator, Civil Service (Medical): No    Lack of Transportation (Non-Medical): No  Physical Activity: Not on file  Stress: Not on file  Social Connections: Socially Integrated (01/09/2024)   Social Connection and Isolation Panel    Frequency of Communication with Friends and Family: Twice a week    Frequency of Social Gatherings with Friends and Family: Twice a week    Attends Religious Services: More than 4 times per year    Active Member of [employer] or Organizations: Patient declined    Attends Banker Meetings: 1 to 4 times per year    Marital Status: Married    Allergies:  Allergies  Allergen Reactions   Erythromycin Hives   Tetracyclines & Related Hives    Metabolic Disorder Labs: Lab Results  Component Value Date   HGBA1C 5.0 01/11/2024   MPG 96.8 01/11/2024   No results found for: PROLACTIN Lab Results  Component Value Date   CHOL 230 (H) 01/13/2024   TRIG 120 01/13/2024   HDL 58 01/13/2024   CHOLHDL 4.0 01/13/2024   VLDL 24 01/13/2024   LDLCALC 148 (H) 01/13/2024   LDLCALC 218 (H) 01/11/2024   Lab Results  Component Value Date   TSH 2.284 01/11/2024   TSH 1.110 11/04/2023    Therapeutic Level Labs: No results found for: LITHIUM No results found for:  VALPROATE No results found for: CBMZ  Current Medications: Current Outpatient Medications  Medication Sig Dispense Refill   buPROPion  (WELLBUTRIN  XL) 300 MG 24 hr tablet Take 1 tablet (300 mg total) by mouth daily. 30 tablet 3   busPIRone  (BUSPAR ) 10 MG tablet Take 1 tablet (10 mg total) by mouth 3 (three) times daily. 90 tablet 0   cholecalciferol  (CHOLECALCIFEROL ) 25 MCG tablet Take 5 tablets (5,000 Units total) by mouth daily. 30  tablet 0   ferrous sulfate  325 (65 FE) MG tablet Take 1 tablet (325 mg total) by mouth daily with breakfast. 30 tablet 3   FLUoxetine  (PROZAC ) 40 MG capsule Take 1 capsule (40 mg total) by mouth daily. 30 capsule 3   furosemide  (LASIX ) 20 MG tablet Take 1 tablet (20 mg total) by mouth daily. 30 tablet 3   [START ON 03/30/2024] gabapentin  (NEURONTIN ) 300 MG capsule Take 1 capsule (300 mg total) by mouth at bedtime. 30 capsule 1   metoprolol  succinate (TOPROL -XL) 50 MG 24 hr tablet Take 1 tablet (50 mg total) by mouth daily. Take with or immediately following a meal. 30 tablet 0   pantoprazole  (PROTONIX ) 40 MG tablet Take 1 tablet (40 mg total) by mouth at bedtime for 14 days. (Patient not taking: Reported on 01/19/2024) 14 tablet 0   traZODone  (DESYREL ) 50 MG tablet Take 1 tablet (50 mg total) by mouth at bedtime as needed for sleep. 30 tablet 0   No current facility-administered medications for this visit.     Musculoskeletal: Strength & Muscle Tone: N/A Gait & Station: N/A Patient leans: N/A  Psychiatric Specialty Exam: Review of Systems  Psychiatric/Behavioral:  Positive for dysphoric mood. Negative for agitation, behavioral problems, confusion, decreased concentration, hallucinations, self-injury, sleep disturbance and suicidal ideas. The patient is nervous/anxious. The patient is not hyperactive.   All other systems reviewed and are negative.   There were no vitals taken for this visit.There is no height or weight on file to calculate BMI.  General  Appearance: Well Groomed  Eye Contact:  Good  Speech:  Clear and Coherent  Volume:  Normal  Mood:  Anxious  Affect:  Appropriate, Congruent, and calm  Thought Process:  Coherent  Orientation:  Full (Time, Place, and Person)  Thought Content: Logical   Suicidal Thoughts:  No  Homicidal Thoughts:  No  Memory:  Immediate;   Good  Judgement:  Good  Insight:  Good  Psychomotor Activity:  Normal  Concentration:  Concentration: Good and Attention Span: Good  Recall:  Good  Fund of Knowledge: Good  Language: Good  Akathisia:  No  Handed:  Right  AIMS (if indicated): not done  Assets:  Communication Skills Desire for Improvement  ADL's:  Intact  Cognition: WNL  Sleep:  Good   Screenings: AUDIT    Flowsheet Row [facility]  Alcohol Use Disorder Identification Test Final Score (AUDIT) 0   GAD-7    Flowsheet Row Office Visit from 01/19/2024 in [facility] Office Visit from 11/04/2023 in [facility] Office Visit from 08/12/2023 in [facility] Office Visit from 06/23/2023 in [facility] Office Visit from 04/25/2023 in [facility]  Total GAD-7 Score 21 21 21 21 18    PHQ2-9    Flowsheet Row Office Visit from 01/19/2024 in [facility] Office Visit from 11/04/2023 in [facility] Office Visit from 08/12/2023 in [facility] Office Visit from 06/23/2023 in [facility] Office Visit from 04/25/2023 in [facility]  PHQ-2 Total Score 5 2 3 3 3   PHQ-9 Total Score 24 18 11 13 11    Flowsheet Row Office Visit from 01/19/2024 in [facility] Most recent reading at 01/19/2024  6:49 PM Admission (Discharged) [facility] Most recent reading at 01/09/2024 10:29 PM ED from 01/09/2024 in [facility] at [facility] Most recent reading at 01/09/2024 10:40 AM  C-SSRS RISK CATEGORY Error: Q7 should not be populated when Q6 is No Moderate Risk Moderate Risk     Assessment and Plan:  Tracie James is a 53 y.o. year old female with a history of depression, history of PVC, r/o seizure, who presents for follow up appointment for below.   1. MDD (major depressive disorder), recurrent episode, mild (HCC) 2. Anxiety disorder, unspecified type She was originally on bupropion  300 mg daily, fluoxetine  80 mg daily, lurasidone  60 mg daily, lorazepam  1 mg prn at the initial visit. She has been on bupropion  450 mg daily since 06/2016. [facility] admission in 12/2023 with SI with thought to shoot herself in the head, OD medication. Although the patient received a diagnosis of bipolar disorder during her recent admission, she denies any significant history of hypomanic or manic episodes, with the exception of a brief two-day period of shopping to elevate mood. She also denies experiencing hallucinations, clarifying that her experiences were more aligned with internal thoughts rather than perceptual disturbances. Her overall clinical presentation is more consistent with depressive symptoms, and ongoing monitoring is recommended to further assess diagnostic clarity.  She continues to demonstrate brighter affect, and she reports overall improvement in mood symptoms.  Although she reports anxiety and difficulty in concentration related to work, she prefers to stay on the current medication regimen at this time given it has been overall improving.  Will continue current dose of fluoxetine  to target depression and anxiety.  Will continue bupropion  as adjunctive treatment for depression, while monitoring any medication interaction.  She is aware of increased risk of seizure.  Will  continue BuSpar  for anxiety.  Will continue gabapentin  for anxiety and hot flashes.  Will have hydroxyzine  and lorazepam  as needed available for anxiety, which she has been able to limit its use.  She is in the process of starting PHP or IOP.  FMLA paperwork was submitted to allow this as well.   3. Insomnia, unspecified type - sleep study was not conclusive for sleep apnea in July 2023   Overall stable.  Will continue current dose of trazodone  as needed for insomnia.    4. High risk medication use There is a concern of history of seizure.  She will have an appointment in January and is on the waiting list to see a neurologist.    # LFT abnormality  According to the chart review, she has LFT abnormalities, although it appears to be peaked out.  Primary care was notified of this, and she will have a follow up.    # THC use She has been abstinent from [facility] use.  Will continue motivational interview.      Plan Continue fluoxetine  40 mg daily  Continue bupropion  300 mg daily Continue gabapentin  300 mg at night (possible gait disturbance from higher dose Continue Buspar  10 mg three times a day  Continue hydroxyzine  25 mg twice a day as needed for anxiety- she declined a refill Continue lorazepam  1 mg as needed for anxiety - she rarely takes this medication Continue Trazodone  25-50 mg at night as needed for insomnia Next appointment - 10/17 at 10 30, video- waitlist for sooner appointment - she will reconnect with Ms. Richerd Ling for therapy  Past trials of medication: sertraline, fluoxetine , bupropion , duloxetine,, Lithium (sick), lamotrigine  (weight gain), Depakote (spending money), Latuda ,  Abilify  (did not work), quetiapine (weight gain, somnolence). Geodon  (PVC occurred), Vraylar    The patient demonstrates the following risk factors for suicide: Chronic risk factors for suicide include: psychiatric disorder of depression and previous suicide attempts of overdosing medication. Acute  risk factors for suicide include: family or marital conflict. Protective factors for this patient include: responsibility to others (children, family), coping skills and hope for the future. Patient is future oriented and agrees to come for next appointment. Considering these factors, the overall suicide risk at this point appears to be low. She is future oriented, and is amenable to treatment. Patient is appropriate for outpatient follow up.  Collaboration of Care: Collaboration of Care: Other reviewed notes in Epic  Patient/Guardian was advised Release of Information must be obtained prior to any record release in order to collaborate their care with an outside provider. Patient/Guardian was advised if they have not already done so to contact the registration department to sign all necessary forms in order for us  to release information regarding their care.   Consent: Patient/Guardian gives verbal consent for treatment and assignment of benefits for services provided during this visit. Patient/Guardian expressed understanding and agreed to proceed.    Katheren Sleet, MD 03/09/2024, 10:29 AM

## 2024-03-09 ENCOUNTER — Telehealth (INDEPENDENT_AMBULATORY_CARE_PROVIDER_SITE_OTHER): Admitting: Psychiatry

## 2024-03-09 ENCOUNTER — Encounter: Payer: Self-pay | Admitting: Psychiatry

## 2024-03-09 DIAGNOSIS — F330 Major depressive disorder, recurrent, mild: Secondary | ICD-10-CM | POA: Diagnosis not present

## 2024-03-09 DIAGNOSIS — G4700 Insomnia, unspecified: Secondary | ICD-10-CM | POA: Diagnosis not present

## 2024-03-09 DIAGNOSIS — F419 Anxiety disorder, unspecified: Secondary | ICD-10-CM

## 2024-03-09 MED ORDER — GABAPENTIN 300 MG PO CAPS: 300.0000 mg | ORAL_CAPSULE | Freq: Every day | ORAL | 1 refills | Status: DC

## 2024-03-09 NOTE — Patient Instructions (Signed)
 Continue fluoxetine  40 mg daily  Continue bupropion  300 mg daily Continue gabapentin  300 mg at night  Continue Buspar  10 mg three times a day  Continue hydroxyzine  25 mg twice a day as needed for anxiety Continue lorazepam  1 mg as needed for anxiety  Continue Trazodone  25-50 mg at night as needed for insomnia Next appointment - 10/17 at 10 30

## 2024-03-14 ENCOUNTER — Telehealth (INDEPENDENT_AMBULATORY_CARE_PROVIDER_SITE_OTHER): Admitting: Nurse Practitioner

## 2024-03-14 ENCOUNTER — Other Ambulatory Visit: Payer: Self-pay | Admitting: Nurse Practitioner

## 2024-03-14 ENCOUNTER — Encounter: Payer: Self-pay | Admitting: Nurse Practitioner

## 2024-03-14 DIAGNOSIS — N951 Menopausal and female climacteric states: Secondary | ICD-10-CM | POA: Insufficient documentation

## 2024-03-14 DIAGNOSIS — E7800 Pure hypercholesterolemia, unspecified: Secondary | ICD-10-CM

## 2024-03-14 DIAGNOSIS — E559 Vitamin D deficiency, unspecified: Secondary | ICD-10-CM

## 2024-03-14 DIAGNOSIS — R7989 Other specified abnormal findings of blood chemistry: Secondary | ICD-10-CM

## 2024-03-14 DIAGNOSIS — F331 Major depressive disorder, recurrent, moderate: Secondary | ICD-10-CM

## 2024-03-14 DIAGNOSIS — D696 Thrombocytopenia, unspecified: Secondary | ICD-10-CM

## 2024-03-14 DIAGNOSIS — D508 Other iron deficiency anemias: Secondary | ICD-10-CM

## 2024-03-14 DIAGNOSIS — G40909 Epilepsy, unspecified, not intractable, without status epilepticus: Secondary | ICD-10-CM

## 2024-03-14 MED ORDER — NAPROXEN 500 MG PO TABS: 500.0000 mg | ORAL_TABLET | Freq: Two times a day (BID) | ORAL | 1 refills | Status: DC

## 2024-03-14 NOTE — Assessment & Plan Note (Signed)
 Ongoing issues, is more complex case as is on multiple mental health medications and Gabapentin , can not increase dose on Gabapentin  because 400 MG made her more off balance.  She also has history of seizures.  No history of breast or uterine CA + no history of DVT or bleeding disorders.  At this time will get her into menopause specialist with GYN for further recommendations and to see if hormone therapy would be recommended.  Discussed plan with patient at length, she agrees with this plan.  Will get labs outpatient, full labs to check liver and has physical in September.

## 2024-03-14 NOTE — Patient Instructions (Signed)
 Menopause: What to Know Menopause is the time in your life when your menstrual periods stop. It marks the end of your ability to get pregnant. It can be defined as not having a period for 12 months without another medical cause. The time when you start to move into menopause is called perimenopause. It often happens between ages 67-55. It can last for many years. During perimenopause, hormone levels change in your body. This can cause symptoms and affect your health. Menopause may make you more likely to have: Bones that are weak and break more easily. Depression. This is when you feel sad or hopeless. Arteries that harden and get narrow. These can cause heart attacks and strokes. What are the causes? In most cases, menopause is a natural change to your body and hormone levels that happens as you get older. But in some cases, it may be caused by changes that aren't natural. These include: Surgery to take out both ovaries. Side effects from some medicines. What increases the risk? You're more likely to go through menopause early if: You have an abnormal growth (tumor) of the pituitary gland in your brain. You have a disease that affects your ovaries. You've had certain treatments for cancer. These include: Chemotherapy. Hormone therapy. Radiation therapy on the area between your hips (pelvis). You smoke a lot or drink a lot of alcohol. Other people in your family have gone through menopause early. You're very thin. What are the signs or symptoms? You may have: Hot flashes. Irregular periods. Night sweats. Changes in how you feel about sex. You may: Have less of a sex drive. Feel more discomfort around your sexuality. Vaginal dryness and thinning of the vaginal walls. This may make it hurt to have sex. Skin changes, such as: Dry skin. New wrinkles. Headaches. Other symptoms may include: Trouble sleeping. Mood swings. Memory problems. Weight gain. Hair growth on your face and  chest. Bladder infections or trouble peeing. How is this diagnosed? You may be diagnosed based on: Your medical history. An exam. Your age. Your history of menstrual periods. Your symptoms. Hormone tests. How is this treated? In some cases, no treatment is needed. Talk with your health care provider about if you should get treated. Treatments may include: Menopausal hormone therapy (MHT). Medicines to treat certain symptoms. Acupuncture. Vitamin or herbal supplements. Before you start treatment, let your provider know if you or anyone in your family has or has had: Heart disease. Breast cancer. Blood clots. Diabetes. Osteoporosis. Follow these instructions at home: Eating and drinking  Eat a balanced diet. It should include: Fresh fruits and vegetables. Whole grains. Lean protein. Low-fat dairy. Eat lots of foods that have calcium and vitamin D in them. These can help keep your bones healthy. Foods and drinks that are rich in calcium include: Yogurt and low-fat milk. Beans. Almonds. Sardines. Broccoli and kale. To help prevent hot flashes, stay away from: Alcohol. Drinks with caffeine in them. Spicy foods. Lifestyle Do not smoke, vape, or use nicotine or tobacco. Get 7-8 hours of sleep each night. If you have hot flashes, you may want to: Dress in layers. Avoid things that may trigger hot flashes, like warm places or stress. Take slow, deep breaths when a hot flash starts. Keep a fan in your home and office. Find ways to manage stress. You may want to try: Deep breathing. Meditation. Writing in a journal. Ask your provider about going to group therapy. Therapy can help you get support from others who are going  through menopause. General instructions  Talk with your provider before you take any herbal supplements. Keep track of your symptoms. Track: When they start. How often you have them. How long they last. Use vaginal lubricants or moisturizers. These  can help with: Vaginal dryness. Comfort during sex. Contact a health care provider if: You're older than 55 and still get periods. You have pain during sex. You haven't had a period for 12 months and then start to bleed from your vagina. It hurts to pee. You get very bad headaches. Get help right away if: You're very depressed. You have a lot of bleeding from your vagina. Your heart is beating too fast. You have very bad belly pain or indigestion that doesn't go away with medicines. This information is not intended to replace advice given to you by your health care provider. Make sure you discuss any questions you have with your health care provider. Document Revised: 03/10/2023 Document Reviewed: 03/10/2023 Elsevier Patient Education  2024 [employer].

## 2024-03-14 NOTE — Progress Notes (Signed)
 LMP  (LMP Unknown)    Subjective:    Patient ID: Tracie James, female    DOB: 02/10/1971, 53 y.o.   MRN: [id number]  HPI: Tracie James is a 53 y.o. female  Chief Complaint  Patient presents with   Menopause   Virtual Visit via Video Note  I connected with Tracie James on 03/14/24 at  4:20 PM EDT by a video enabled telemedicine application and verified that I am speaking with the correct person using two identifiers.  Location: Patient: home Provider: work   I discussed the limitations of evaluation and management by telemedicine and the availability of in person appointments. The patient expressed understanding and agreed to proceed.  I discussed the assessment and treatment plan with the patient. The patient was provided an opportunity to ask questions and all were answered. The patient agreed with the plan and demonstrated an understanding of the instructions.   The patient was advised to call back or seek an in-person evaluation if the symptoms worsen or if the condition fails to improve as anticipated.  I provided 25 minutes of non-face-to-face time during this encounter.   Erica Osuna T Chany Woolworth, NP   MENOPAUSAL SYMPTOMS Hot flashes have been increasing and miserable. Is up every 20 minutes putting on clothes or taking off clothes, makes her very irritable. Has not had a menstrual cycle since 2012. Psychiatry reduced her Gabapentin  to 300 MG due to some reports of imbalance by her husband while on the 400 MG dosing. LFTs recently elevated on labs . At time she was taking lots of Motrin and Tylenol  for discomfort to tooth. Is scheduled to see GI on 08/02/24.  Has history of palpitations, last cardiology visit was 06/28/22. Gravida/Para: 3/2 Duration: uncontrolled Symptom severity: moderate Hot flashes: yes Night sweats: yes Sleep disturbances: yes Vaginal dryness: no Dyspareunia:no Decreased libido: no Emotional lability: yes Stress incontinence: no Previous  HRT/pharmacotherapy: no Hysterectomy: no GYN surgery: none Absolute Contraindications to Hormonal Therapy:     Undiagnosed vaginal bleeding: no    Breast cancer: no    Endometrial cancer: no    Coronary disease: no    Cerebrovascular disease: no    Venous thromboembolic disease: no   Relevant past medical, surgical, family and social history reviewed and updated as indicated. Interim medical history since our last visit reviewed. Allergies and medications reviewed and updated.  Review of Systems  Constitutional:  Negative for activity change, appetite change, diaphoresis, fatigue and fever.  Respiratory:  Negative for cough, chest tightness, shortness of breath and wheezing.   Cardiovascular:  Negative for chest pain, palpitations and leg swelling.  Gastrointestinal: Negative.   Neurological: Negative.   Psychiatric/Behavioral:  Positive for sleep disturbance. Negative for decreased concentration, self-injury and suicidal ideas. The patient is nervous/anxious.     Per HPI unless specifically indicated above     Objective:    LMP  (LMP Unknown)   Wt Readings from Last 3 Encounters:  01/09/24 200 lb (90.7 kg)  11/17/23 217 lb (98.4 kg)  11/04/23 212 lb (96.2 kg)    Physical Exam Vitals and nursing note reviewed.  Constitutional:      General: She is awake. She is not in acute distress.    Appearance: She is well-developed and well-groomed. She is obese. She is not ill-appearing or toxic-appearing.  HENT:     Head: Normocephalic.     Right Ear: Hearing normal.     Left Ear: Hearing normal.  Eyes:     General: Lids are  normal.        Right eye: No discharge.        Left eye: No discharge.     Conjunctiva/sclera: Conjunctivae normal.  Pulmonary:     Effort: Pulmonary effort is normal. No accessory muscle usage or respiratory distress.  Musculoskeletal:     Cervical back: Normal range of motion.  Neurological:     Mental Status: She is alert and oriented to person,  place, and time.  Psychiatric:        Attention and Perception: Attention normal.        Mood and Affect: Mood normal.        Behavior: Behavior normal. Behavior is cooperative.        Thought Content: Thought content normal.        Judgment: Judgment normal.     Results for orders placed or performed during the hospital encounter of 01/09/24  Comprehensive metabolic panel   Collection Time: 01/09/24 10:59 AM  Result Value Ref Range   Sodium 136 135 - 145 mmol/L   Potassium 3.8 3.5 - 5.1 mmol/L   Chloride 101 98 - 111 mmol/L   CO2 19 (L) 22 - 32 mmol/L   Glucose, Bld 117 (H) 70 - 99 mg/dL   BUN 14 6 - 20 mg/dL   Creatinine, Ser 8.91 (H) 0.44 - 1.00 mg/dL   Calcium 9.8 8.9 - 89.6 mg/dL   Total Protein 7.8 6.5 - 8.1 g/dL   Albumin 4.3 3.5 - 5.0 g/dL   AST 888 (H) 15 - 41 U/L   ALT 152 (H) 0 - 44 U/L   Alkaline Phosphatase 85 38 - 126 U/L   Total Bilirubin 1.2 0.0 - 1.2 mg/dL   GFR, Estimated >39 >39 mL/min   Anion gap 16 (H) 5 - 15  Ethanol   Collection Time: 01/09/24 10:59 AM  Result Value Ref Range   Alcohol, Ethyl (B) <15 <15 mg/dL  cbc   Collection Time: 01/09/24 10:59 AM  Result Value Ref Range   WBC 11.4 (H) 4.0 - 10.5 K/uL   RBC 5.24 (H) 3.87 - 5.11 MIL/uL   Hemoglobin 14.2 12.0 - 15.0 g/dL   HCT 55.4 63.9 - 53.9 %   MCV 84.9 80.0 - 100.0 fL   MCH 27.1 26.0 - 34.0 pg   MCHC 31.9 30.0 - 36.0 g/dL   RDW 84.7 88.4 - 84.4 %   Platelets 348 150 - 400 K/uL   nRBC 0.0 0.0 - 0.2 %  Urine Drug Screen, Qualitative   Collection Time: 01/09/24 10:59 AM  Result Value Ref Range   Tricyclic, Ur Screen NONE DETECTED NONE DETECTED   Amphetamines, Ur Screen NONE DETECTED NONE DETECTED   MDMA (Ecstasy)Ur Screen NONE DETECTED NONE DETECTED   Cocaine Metabolite,Ur [place] NONE DETECTED NONE DETECTED   Opiate, Ur Screen NONE DETECTED NONE DETECTED   Phencyclidine (PCP) Ur S NONE DETECTED NONE DETECTED   Cannabinoid 50 Ng, Ur [place] POSITIVE (A) NONE DETECTED   Barbiturates, Ur Screen  NONE DETECTED NONE DETECTED   Benzodiazepine, Ur Scrn NONE DETECTED NONE DETECTED   Methadone Scn, Ur NONE DETECTED NONE DETECTED  Lipase, blood   Collection Time: 01/09/24 10:59 AM  Result Value Ref Range   Lipase 39 11 - 51 U/L  Urinalysis, Routine w reflex microscopic -Urine, Clean Catch   Collection Time: 01/09/24 10:59 AM  Result Value Ref Range   Color, Urine YELLOW (A) YELLOW   APPearance HAZY (A) CLEAR   Specific Gravity,  Urine 1.016 1.005 - 1.030   pH 5.0 5.0 - 8.0   Glucose, UA NEGATIVE NEGATIVE mg/dL   Hgb urine dipstick NEGATIVE NEGATIVE   Bilirubin Urine NEGATIVE NEGATIVE   Ketones, ur 20 (A) NEGATIVE mg/dL   Protein, ur NEGATIVE NEGATIVE mg/dL   Nitrite NEGATIVE NEGATIVE   Leukocytes,Ua NEGATIVE NEGATIVE  Pregnancy, urine   Collection Time: 01/09/24 10:59 AM  Result Value Ref Range   Preg Test, Ur NEGATIVE NEGATIVE  Hepatitis panel, acute   Collection Time: 01/09/24 10:59 AM  Result Value Ref Range   Hepatitis B Surface Ag NON REACTIVE NON REACTIVE   HCV Ab NON REACTIVE NON REACTIVE   Hep A IgM NON REACTIVE NON REACTIVE   Hep B C IgM NON REACTIVE NON REACTIVE      Assessment & Plan:   Problem List Items Addressed This Visit       Cardiovascular and Mediastinum   Hot flashes due to menopause - Primary   Ongoing issues, is more complex case as is on multiple mental health medications and Gabapentin , can not increase dose on Gabapentin  because 400 MG made her more off balance.  She also has history of seizures.  No history of breast or uterine CA + no history of DVT or bleeding disorders.  At this time will get her into menopause specialist with GYN for further recommendations and to see if hormone therapy would be recommended.  Discussed plan with patient at length, she agrees with this plan.  Will get labs outpatient, full labs to check liver and has physical in September.      Relevant Orders   Ambulatory referral to Gynecology     Follow up plan: Return  for as scheduled in September.

## 2024-03-15 NOTE — Progress Notes (Signed)
 Appt scheduled

## 2024-03-29 ENCOUNTER — Other Ambulatory Visit

## 2024-03-29 DIAGNOSIS — D508 Other iron deficiency anemias: Secondary | ICD-10-CM

## 2024-03-29 DIAGNOSIS — E7800 Pure hypercholesterolemia, unspecified: Secondary | ICD-10-CM

## 2024-03-29 DIAGNOSIS — R7989 Other specified abnormal findings of blood chemistry: Secondary | ICD-10-CM

## 2024-03-29 DIAGNOSIS — G40909 Epilepsy, unspecified, not intractable, without status epilepticus: Secondary | ICD-10-CM

## 2024-03-29 DIAGNOSIS — E559 Vitamin D deficiency, unspecified: Secondary | ICD-10-CM

## 2024-03-30 ENCOUNTER — Ambulatory Visit: Payer: Self-pay | Admitting: Nurse Practitioner

## 2024-03-30 LAB — COMPREHENSIVE METABOLIC PANEL WITH GFR
ALT: 71 IU/L — ABNORMAL HIGH (ref 0–32)
AST: 43 IU/L — ABNORMAL HIGH (ref 0–40)
Albumin: 4.4 g/dL (ref 3.8–4.9)
Alkaline Phosphatase: 86 IU/L (ref 44–121)
BUN/Creatinine Ratio: 12 (ref 9–23)
BUN: 16 mg/dL (ref 6–24)
Bilirubin Total: 0.3 mg/dL (ref 0.0–1.2)
CO2: 22 mmol/L (ref 20–29)
Calcium: 9.4 mg/dL (ref 8.7–10.2)
Chloride: 101 mmol/L (ref 96–106)
Creatinine, Ser: 1.35 mg/dL — ABNORMAL HIGH (ref 0.57–1.00)
Globulin, Total: 2 g/dL (ref 1.5–4.5)
Glucose: 88 mg/dL (ref 70–99)
Potassium: 4.2 mmol/L (ref 3.5–5.2)
Sodium: 138 mmol/L (ref 134–144)
Total Protein: 6.4 g/dL (ref 6.0–8.5)
eGFR: 46 mL/min/1.73 — ABNORMAL LOW (ref >59)

## 2024-03-30 LAB — CBC WITH DIFFERENTIAL/PLATELET
Basophils Absolute: 0.1 x10E3/uL (ref 0.0–0.2)
Basos: 1 %
EOS (ABSOLUTE): 0.3 x10E3/uL (ref 0.0–0.4)
Eos: 5 %
Hematocrit: 37.5 % (ref 34.0–46.6)
Hemoglobin: 11.8 g/dL (ref 11.1–15.9)
Immature Grans (Abs): 0 x10E3/uL (ref 0.0–0.1)
Immature Granulocytes: 0 %
Lymphocytes Absolute: 1.7 x10E3/uL (ref 0.7–3.1)
Lymphs: 29 %
MCH: 28 pg (ref 26.6–33.0)
MCHC: 31.5 g/dL (ref 31.5–35.7)
MCV: 89 fL (ref 79–97)
Monocytes Absolute: 0.4 x10E3/uL (ref 0.1–0.9)
Monocytes: 6 %
Neutrophils Absolute: 3.4 x10E3/uL (ref 1.4–7.0)
Neutrophils: 59 %
Platelets: 229 x10E3/uL (ref 150–450)
RBC: 4.21 x10E6/uL (ref 3.77–5.28)
RDW: 13.3 % (ref 11.7–15.4)
WBC: 5.9 x10E3/uL (ref 3.4–10.8)

## 2024-03-30 LAB — HEPATITIS B SURFACE ANTIGEN: Hepatitis B Surface Ag: NEGATIVE

## 2024-03-30 LAB — LIPID PANEL W/O CHOL/HDL RATIO
Cholesterol, Total: 223 mg/dL — ABNORMAL HIGH (ref 100–199)
HDL: 75 mg/dL (ref >39)
LDL Chol Calc (NIH): 136 mg/dL — ABNORMAL HIGH (ref 0–99)
Triglycerides: 69 mg/dL (ref 0–149)
VLDL Cholesterol Cal: 12 mg/dL (ref 5–40)

## 2024-03-30 LAB — VITAMIN D 25 HYDROXY (VIT D DEFICIENCY, FRACTURES): Vit D, 25-Hydroxy: 86.5 ng/mL (ref 30.0–100.0)

## 2024-03-30 LAB — SEDIMENTATION RATE: Sed Rate: 25 mm/h (ref 0–40)

## 2024-03-30 LAB — FERRITIN: Ferritin: 38 ng/mL (ref 15–150)

## 2024-03-30 LAB — TSH: TSH: 1.73 u[IU]/mL (ref 0.450–4.500)

## 2024-03-30 LAB — FOLATE: Folate: 7.7 ng/mL (ref >3.0)

## 2024-03-30 LAB — HEPATITIS B SURFACE ANTIBODY, QUANTITATIVE: Hepatitis B Surf Ab Quant: 92.1 m[IU]/mL

## 2024-03-30 LAB — HEPATITIS C ANTIBODY: Hep C Virus Ab: NONREACTIVE

## 2024-03-30 LAB — IRON: Iron: 44 ug/dL (ref 27–159)

## 2024-03-30 LAB — C-REACTIVE PROTEIN: CRP: 4 mg/L (ref 0–10)

## 2024-03-30 NOTE — Progress Notes (Signed)
 Contacted via MyChart  Good afternoon Tracie James, your labs have returned: - Lipid panel continues to show elevations, ensure heavy focus on healthy diet changes and regular exercise. - Kidney function is showing Chronic Kidney Disease stage 3a still present, ensure good water intake daily.  Liver function is showing mild elevations, but lower than last check.  I would avoid alcohol and Tylenol  at this time. Hepatitis labs all negative. - Iron  level normal, but low normal.  Please ensure you are taking iron  supplement daily.  Remainder of labs are stable.  Any questions? Keep being amazing!!  Thank you for allowing me to participate in your care.  I appreciate you. Kindest regards, Jaxon Flatt

## 2024-04-06 ENCOUNTER — Other Ambulatory Visit: Payer: Self-pay | Admitting: Psychiatry

## 2024-04-08 NOTE — Patient Instructions (Signed)
 Please call to schedule your mammogram and/or bone density: [facility]  Address: [address], [place], [place] [address] Phone: [phone]  [facility] [address] [place],  [place]  [address] Phone: [phone]    Be Involved in Caring For Your Health:  Taking Medications When medications are taken as directed, they can greatly improve your health. But if they are not taken as prescribed, they may not work. In some cases, not taking them correctly can be harmful. To help ensure your treatment remains effective and safe, understand your medications and how to take them. Bring your medications to each visit for review by your provider.  Your lab results, notes, and after visit summary will be available on My Chart. We strongly encourage you to use this feature. If lab results are abnormal the clinic will contact you with the appropriate steps. If the clinic does not contact you assume the results are satisfactory. You can always view your results on My Chart. If you have questions regarding your health or results, please contact the clinic during office hours. You can also ask questions on My Chart.  We at [facility] are grateful that you chose [place] to provide your care. We strive to provide evidence-based and compassionate care and are always looking for feedback. If you get a survey from the clinic please complete this so we can hear your opinions.  Healthy Eating, Adult Healthy eating may help you get and keep a healthy body weight, reduce the risk of chronic disease, and live a long and productive life. It is important to follow a healthy eating pattern. Your nutritional and calorie needs should be met mainly by different nutrient-rich foods. What are tips for following this plan? Reading food labels Read labels and choose the following: Reduced or low sodium products. Juices with 100%  fruit juice. Foods with low saturated fats (<3 g per serving) and high polyunsaturated and monounsaturated fats. Foods with whole grains, such as whole wheat, cracked wheat, brown rice, and wild rice. Whole grains that are fortified with folic acid. This is recommended for females who are pregnant or who want to become pregnant. Read labels and do not eat or drink the following: Foods or drinks with added sugars. These include foods that contain brown sugar, corn sweetener, corn syrup, dextrose, fructose, glucose, high-fructose corn syrup, honey, invert sugar, lactose, malt syrup, maltose, molasses, raw sugar, sucrose, trehalose, or turbinado sugar. Limit your intake of added sugars to less than 10% of your total daily calories. Do not eat more than the following amounts of added sugar per day: 6 teaspoons (25 g) for females. 9 teaspoons (38 g) for males. Foods that contain processed or refined starches and grains. Refined grain products, such as white flour, degermed cornmeal, white bread, and white rice. Shopping Choose nutrient-rich snacks, such as vegetables, whole fruits, and nuts. Avoid high-calorie and high-sugar snacks, such as potato chips, fruit snacks, and candy. Use oil-based dressings and spreads on foods instead of solid fats such as butter, margarine, sour cream, or cream cheese. Limit pre-made sauces, mixes, and "instant" products such as flavored rice, instant noodles, and ready-made pasta. Try more plant-protein sources, such as tofu, tempeh, black beans, edamame, lentils, nuts, and seeds. Explore eating plans such as the Mediterranean diet or vegetarian diet. Try heart-healthy dips made with beans and healthy fats like hummus and guacamole. Vegetables go great with these. Cooking Use oil  to saut or stir-fry foods instead of solid fats such as butter, margarine, or lard. Try baking, boiling, grilling, or broiling instead of frying. Remove the fatty part of meats before  cooking. Steam vegetables in water or broth. Meal planning  At meals, imagine dividing your plate into fourths: One-half of your plate is fruits and vegetables. One-fourth of your plate is whole grains. One-fourth of your plate is protein, especially lean meats, poultry, eggs, tofu, beans, or nuts. Include low-fat dairy as part of your daily diet. Lifestyle Choose healthy options in all settings, including home, work, school, restaurants, or stores. Prepare your food safely: Wash your hands after handling raw meats. Where you prepare food, keep surfaces clean by regularly washing with hot, soapy water. Keep raw meats separate from ready-to-eat foods, such as fruits and vegetables. Cook seafood, meat, poultry, and eggs to the recommended temperature. Get a food thermometer. Store foods at safe temperatures. In general: Keep cold foods at 58F (4.4C) or below. Keep hot foods at 158F (60C) or above. Keep your freezer at [facility] (-17.8C) or below. Foods are not safe to eat if they have been between the temperatures of 40-158F (4.4-60C) for more than 2 hours. What foods should I eat? Fruits Aim to eat 1-2 cups of fresh, canned (in natural juice), or frozen fruits each day. One cup of fruit equals 1 small apple, 1 large banana, 8 large strawberries, 1 cup (237 g) canned fruit,  cup (82 g) dried fruit, or 1 cup (240 mL) 100% juice. Vegetables Aim to eat 2-4 cups of fresh and frozen vegetables each day, including different varieties and colors. One cup of vegetables equals 1 cup (91 g) broccoli or cauliflower florets, 2 medium carrots, 2 cups (150 g) raw, leafy greens, 1 large tomato, 1 large bell pepper, 1 large sweet potato, or 1 medium white potato. Grains Aim to eat 5-10 ounce-equivalents of whole grains each day. Examples of 1 ounce-equivalent of grains include 1 slice of bread, 1 cup (40 g) ready-to-eat cereal, 3 cups (24 g) popcorn, or  cup (93 g) cooked rice. Meats and other  proteins Try to eat 5-7 ounce-equivalents of protein each day. Examples of 1 ounce-equivalent of protein include 1 egg,  oz nuts (12 almonds, 24 pistachios, or 7 walnut halves), 1/4 cup (90 g) cooked beans, 6 tablespoons (90 g) hummus or 1 tablespoon (16 g) peanut butter. A cut of meat or fish that is the size of a deck of cards is about 3-4 ounce-equivalents (85 g). Of the protein you eat each week, try to have at least 8 sounce (227 g) of seafood. This is about 2 servings per week. This includes salmon, trout, herring, sardines, and anchovies. Dairy Aim to eat 3 cup-equivalents of fat-free or low-fat dairy each day. Examples of 1 cup-equivalent of dairy include 1 cup (240 mL) milk, 8 ounces (250 g) yogurt, 1 ounces (44 g) natural cheese, or 1 cup (240 mL) fortified soy milk. Fats and oils Aim for about 5 teaspoons (21 g) of fats and oils per day. Choose monounsaturated fats, such as canola and olive oils, mayonnaise made with olive oil or avocado oil, avocados, peanut butter, and most nuts, or polyunsaturated fats, such as sunflower, corn, and soybean oils, walnuts, pine nuts, sesame seeds, sunflower seeds, and flaxseed. Beverages Aim for 6 eight-ounce glasses of water per day. Limit coffee to 3-5 eight-ounce cups per day. Limit caffeinated beverages that have added calories, such as soda and energy drinks. If you drink  alcohol: Limit how much you have to: 0-1 drink a day if you are female. 0-2 drinks a day if you are female. Know how much alcohol is in your drink. In the [place] one drink is one 12 oz bottle of beer (355 mL), one 5 oz glass of wine (148 mL), or one 1 oz glass of hard liquor (44 mL). Seasoning and other foods Try not to add too much salt to your food. Try using herbs and spices instead of salt. Try not to add sugar to food. This information is based on [place] nutrition guidelines. To learn more, visit [employer]. Exact amounts may vary. You may need different amounts. This  information is not intended to replace advice given to you by your health care provider. Make sure you discuss any questions you have with your health care provider. Document Revised: 04/05/2022 Document Reviewed: 04/05/2022 Elsevier Patient Education  2024 [employer].

## 2024-04-11 ENCOUNTER — Ambulatory Visit (INDEPENDENT_AMBULATORY_CARE_PROVIDER_SITE_OTHER): Admitting: Nurse Practitioner

## 2024-04-11 ENCOUNTER — Encounter: Payer: Self-pay | Admitting: Nurse Practitioner

## 2024-04-11 VITALS — BP 100/66 | HR 77 | Temp 98.1°F | Resp 15 | Ht 64.02 in | Wt 213.0 lb

## 2024-04-11 DIAGNOSIS — Z6841 Body Mass Index (BMI) 40.0 and over, adult: Secondary | ICD-10-CM

## 2024-04-11 DIAGNOSIS — N951 Menopausal and female climacteric states: Secondary | ICD-10-CM | POA: Diagnosis not present

## 2024-04-11 DIAGNOSIS — F419 Anxiety disorder, unspecified: Secondary | ICD-10-CM

## 2024-04-11 DIAGNOSIS — Z0000 Encounter for general adult medical examination without abnormal findings: Secondary | ICD-10-CM

## 2024-04-11 DIAGNOSIS — F331 Major depressive disorder, recurrent, moderate: Secondary | ICD-10-CM

## 2024-04-11 DIAGNOSIS — G40909 Epilepsy, unspecified, not intractable, without status epilepticus: Secondary | ICD-10-CM | POA: Diagnosis not present

## 2024-04-11 DIAGNOSIS — Z1231 Encounter for screening mammogram for malignant neoplasm of breast: Secondary | ICD-10-CM

## 2024-04-11 DIAGNOSIS — D696 Thrombocytopenia, unspecified: Secondary | ICD-10-CM

## 2024-04-11 DIAGNOSIS — F411 Generalized anxiety disorder: Secondary | ICD-10-CM

## 2024-04-11 DIAGNOSIS — E66813 Obesity, class 3: Secondary | ICD-10-CM

## 2024-04-11 DIAGNOSIS — Z23 Encounter for immunization: Secondary | ICD-10-CM

## 2024-04-11 MED ORDER — GABAPENTIN 300 MG PO CAPS: 300.0000 mg | ORAL_CAPSULE | Freq: Every day | ORAL | 3 refills | Status: DC

## 2024-04-11 NOTE — Assessment & Plan Note (Signed)
 Refer to depression plan of care.

## 2024-04-11 NOTE — Progress Notes (Signed)
 BP 100/66 (BP Location: Left Arm, Patient Position: Sitting, Cuff Size: Large)   Pulse 77   Temp 98.1 F (36.7 C) (Oral)   Resp 15   Ht 5' 4.02 (1.626 m)   Wt 213 lb (96.6 kg)   LMP  (LMP Unknown)   SpO2 98%   BMI 36.54 kg/m    Subjective:    Patient ID: Tracie James, female    DOB: 1970-11-12, 53 y.o.   MRN: [id number]  HPI: Tracie James is a 53 y.o. female presenting on 04/11/2024 for comprehensive medical examination. Current medical complaints include:none  She currently lives with: significant other Menopausal Symptoms: hot flashes  DEPRESSION & SEIZURE DISORDER Last saw Dr. Vickey on the 22nd, no changes made. For seizure disorder follows withe neurology, last saw 01/06/22.  Has not had any seizures since that time. Taking iron  supplement daily for deficiency, recent labs were improved. Mood status: stable Satisfied with current treatment?: yes Symptom severity: moderate  Duration of current treatment : chronic Side effects: no Medication compliance: good compliance Psychotherapy/counseling: yes in the past Depressed mood: yes Anxious mood: yes Anhedonia: yes Significant weight loss or gain: no Insomnia: none Fatigue: no Feelings of worthlessness or guilt: yes Impaired concentration/indecisiveness: no Suicidal ideations: no Hopelessness: no Crying spells: no    01/19/2024    6:49 PM 11/04/2023    1:55 PM 08/12/2023    2:32 PM 06/23/2023    8:40 AM 04/25/2023    9:57 AM  Depression screen PHQ 2/9  Decreased Interest  1 2 2 2   Down, Depressed, Hopeless  1 1 1 1   PHQ - 2 Score  2 3 3 3   Altered sleeping  2 0 2 0  Tired, decreased energy  3 2 2 1   Change in appetite  2 0 2 3  Feeling bad or failure about yourself   2 2 3 2   Trouble concentrating  3 1 0 1  Moving slowly or fidgety/restless  3 2 0 1  Suicidal thoughts  1 1 1  0  PHQ-9 Score  18 11 13 11   Difficult doing work/chores  Somewhat difficult Somewhat difficult Somewhat difficult Somewhat difficult      Information is confidential and restricted. Go to Review Flowsheets to unlock data.      01/19/2024    6:50 PM 11/04/2023    1:54 PM 08/12/2023    2:33 PM 06/23/2023    8:41 AM  GAD 7 : Generalized Anxiety Score  Nervous, Anxious, on Edge  3 3 3   Control/stop worrying  3 3 3   Worry too much - different things  3 3 3   Trouble relaxing  3 3 3   Restless  3 3 3   Easily annoyed or irritable  3 3 3   Afraid - awful might happen  3 3 3   Total GAD 7 Score  21 21 21   Anxiety Difficulty  Very difficult Somewhat difficult Somewhat difficult     Information is confidential and restricted. Go to Review Flowsheets to unlock data.      07/09/2021    6:55 AM 10/26/2021    4:19 PM 11/16/2021    3:44 PM 04/25/2023    9:57 AM 08/12/2023    2:32 PM  Fall Risk  Falls in the past year?  1 1 1  0  Was there an injury with Fall?  1 0 0 0  Fall Risk Category Calculator  2 1 1  0  Fall Risk Category (Retired)  Moderate  Low     (  RETIRED) Patient Fall Risk Level Low fall risk  Moderate fall risk  Low fall risk     Patient at Risk for Falls Due to  History of fall(s) History of fall(s) No Fall Risks No Fall Risks  Fall risk Follow up  Falls evaluation completed  Falls evaluation completed  Falls evaluation completed Falls evaluation completed     Data saved with a previous flowsheet row definition    Functional Status Survey: Is the patient deaf or have difficulty hearing?: No Does the patient have difficulty seeing, even when wearing glasses/contacts?: No Does the patient have difficulty concentrating, remembering, or making decisions?: No Does the patient have difficulty walking or climbing stairs?: No Does the patient have difficulty dressing or bathing?: No Does the patient have difficulty doing errands alone such as visiting a doctor's office or shopping?: No   Past Medical History:  Past Medical History:  Diagnosis Date   Anxiety    Asthma    Bipolar 2 disorder (HCC)    Depression    GERD  (gastroesophageal reflux disease)    Palpitations    PVC (premature ventricular contraction)     Surgical History:  Past Surgical History:  Procedure Laterality Date   APPENDECTOMY     TOTAL ABDOMINAL HYSTERECTOMY      Medications:  Current Outpatient Medications on File Prior to Visit  Medication Sig   buPROPion  (WELLBUTRIN  XL) 300 MG 24 hr tablet Take 1 tablet (300 mg total) by mouth daily.   busPIRone  (BUSPAR ) 10 MG tablet Take 1 tablet (10 mg total) by mouth 3 (three) times daily.   cholecalciferol  (CHOLECALCIFEROL ) 25 MCG tablet Take 5 tablets (5,000 Units total) by mouth daily.   ferrous sulfate  325 (65 FE) MG tablet Take 1 tablet (325 mg total) by mouth daily with breakfast.   FLUoxetine  (PROZAC ) 40 MG capsule Take 1 capsule (40 mg total) by mouth daily.   furosemide  (LASIX ) 20 MG tablet Take 1 tablet (20 mg total) by mouth daily.   hydrOXYzine  (ATARAX ) 25 MG tablet Take 1 tablet (25 mg total) by mouth daily as needed for anxiety.   LORazepam  (ATIVAN ) 1 MG tablet Take 1 mg by mouth every 8 (eight) hours as needed for anxiety.   metoprolol  succinate (TOPROL -XL) 50 MG 24 hr tablet Take 1 tablet (50 mg total) by mouth daily. Take with or immediately following a meal.   naproxen  (NAPROSYN ) 500 MG tablet Take 1 tablet (500 mg total) by mouth 2 (two) times daily with a meal.   ondansetron  (ZOFRAN ) 4 MG tablet Take 4 mg by mouth every 8 (eight) hours as needed for nausea or vomiting.   pantoprazole  (PROTONIX ) 40 MG tablet Take 1 tablet (40 mg total) by mouth at bedtime for 14 days.   traZODone  (DESYREL ) 50 MG tablet Take 1 tablet (50 mg total) by mouth at bedtime as needed for sleep.   No current facility-administered medications on file prior to visit.    Allergies:  Allergies  Allergen Reactions   Erythromycin Hives   Tetracyclines & Related Hives    Social History:  Social History   Socioeconomic History   Marital status: Divorced    Spouse name: Not on file   Number  of children: Not on file   Years of education: Not on file   Highest education level: Not on file  Occupational History   Not on file  Tobacco Use   Smoking status: Former    Current packs/day: 0.00    Types: Cigarettes  Quit date: 03/31/2014    Years since quitting: 10.0   Smokeless tobacco: Never  Vaping Use   Vaping status: Every Day  Substance and Sexual Activity   Alcohol use: No   Drug use: No   Sexual activity: Yes    Partners: Male    Birth control/protection: Surgical  Other Topics Concern   Not on file  Social History Narrative   Not on file   Social Drivers of Health   Financial Resource Strain: Not on file  Food Insecurity: No Food Insecurity (01/09/2024)   Hunger Vital Sign    Worried About Running Out of Food in the Last Year: Never true    Ran Out of Food in the Last Year: Never true  Transportation Needs: No Transportation Needs (01/09/2024)   PRAPARE - Administrator, Civil Service (Medical): No    Lack of Transportation (Non-Medical): No  Physical Activity: Not on file  Stress: Not on file  Social Connections: Socially Integrated (01/09/2024)   Social Connection and Isolation Panel    Frequency of Communication with Friends and Family: Twice a week    Frequency of Social Gatherings with Friends and Family: Twice a week    Attends Religious Services: More than 4 times per year    Active Member of [employer] or Organizations: Patient declined    Attends Banker Meetings: 1 to 4 times per year    Marital Status: Married  Catering manager Violence: Not At Risk (01/09/2024)   Humiliation, Afraid, Rape, and Kick questionnaire    Fear of Current or Ex-Partner: No    Emotionally Abused: No    Physically Abused: No    Sexually Abused: No   Social History   Tobacco Use  Smoking Status Former   Current packs/day: 0.00   Types: Cigarettes   Quit date: 03/31/2014   Years since quitting: 10.0  Smokeless Tobacco Never   Social History    Substance and Sexual Activity  Alcohol Use No    Family History:  Family History  Problem Relation Age of Onset   Depression Mother    Multiple sclerosis Mother    Schizophrenia Maternal Grandmother    Heart disease Maternal Grandmother        aortic valve replacement    Past medical history, surgical history, medications, allergies, family history and social history reviewed with patient today and changes made to appropriate areas of the chart.   ROS All other ROS negative except what is listed above and in the HPI.      Objective:    BP 100/66 (BP Location: Left Arm, Patient Position: Sitting, Cuff Size: Large)   Pulse 77   Temp 98.1 F (36.7 C) (Oral)   Resp 15   Ht 5' 4.02 (1.626 m)   Wt 213 lb (96.6 kg)   LMP  (LMP Unknown)   SpO2 98%   BMI 36.54 kg/m   Wt Readings from Last 3 Encounters:  04/11/24 213 lb (96.6 kg)  01/09/24 200 lb (90.7 kg)  11/17/23 217 lb (98.4 kg)    Physical Exam Vitals and nursing note reviewed. Exam conducted with a chaperone present.  Constitutional:      General: She is awake. She is not in acute distress.    Appearance: She is well-developed and well-groomed. She is obese. She is not ill-appearing or toxic-appearing.  HENT:     Head: Normocephalic and atraumatic.     Right Ear: Hearing, tympanic membrane, ear canal and external  ear normal. No drainage.     Left Ear: Hearing, tympanic membrane, ear canal and external ear normal. No drainage.     Nose: Nose normal.     Right Sinus: No maxillary sinus tenderness or frontal sinus tenderness.     Left Sinus: No maxillary sinus tenderness or frontal sinus tenderness.     Mouth/Throat:     Mouth: Mucous membranes are moist.     Pharynx: Oropharynx is clear. Uvula midline. No pharyngeal swelling, oropharyngeal exudate or posterior oropharyngeal erythema.  Eyes:     General: Lids are normal.        Right eye: No discharge.        Left eye: No discharge.     Extraocular Movements:  Extraocular movements intact.     Conjunctiva/sclera: Conjunctivae normal.     Pupils: Pupils are equal, round, and reactive to light.     Visual Fields: Right eye visual fields normal and left eye visual fields normal.  Neck:     Thyroid: No thyromegaly.     Vascular: No carotid bruit.     Trachea: Trachea normal.  Cardiovascular:     Rate and Rhythm: Normal rate and regular rhythm.     Heart sounds: Normal heart sounds. No murmur heard.    No gallop.  Pulmonary:     Effort: Pulmonary effort is normal. No accessory muscle usage or respiratory distress.     Breath sounds: Normal breath sounds.  Chest:  Breasts:    Right: Normal.     Left: Normal.  Abdominal:     General: Bowel sounds are normal.     Palpations: Abdomen is soft. There is no hepatomegaly or splenomegaly.     Tenderness: There is no abdominal tenderness.  Musculoskeletal:        General: Normal range of motion.     Cervical back: Normal range of motion and neck supple.     Right lower leg: No edema.     Left lower leg: No edema.  Lymphadenopathy:     Head:     Right side of head: No submental, submandibular, tonsillar, preauricular or posterior auricular adenopathy.     Left side of head: No submental, submandibular, tonsillar, preauricular or posterior auricular adenopathy.     Cervical: No cervical adenopathy.     Upper Body:     Right upper body: No supraclavicular, axillary or pectoral adenopathy.     Left upper body: No supraclavicular, axillary or pectoral adenopathy.  Skin:    General: Skin is warm and dry.     Capillary Refill: Capillary refill takes less than 2 seconds.     Findings: No rash.  Neurological:     Mental Status: She is alert and oriented to person, place, and time.     Gait: Gait is intact.     Deep Tendon Reflexes: Reflexes are normal and symmetric.     Reflex Scores:      Brachioradialis reflexes are 2+ on the right side and 2+ on the left side.      Patellar reflexes are 2+ on the  right side and 2+ on the left side. Psychiatric:        Attention and Perception: Attention normal.        Mood and Affect: Mood normal.        Speech: Speech normal.        Behavior: Behavior normal. Behavior is cooperative.        Thought Content: Thought content normal.  Judgment: Judgment normal.     Results for orders placed or performed in visit on 03/29/24  C-reactive protein   Collection Time: 03/29/24  1:56 PM  Result Value Ref Range   CRP 4 0 - 10 mg/L  Sedimentation rate   Collection Time: 03/29/24  1:56 PM  Result Value Ref Range   Sed Rate 25 0 - 40 mm/hr  Hepatitis C antibody   Collection Time: 03/29/24  1:56 PM  Result Value Ref Range   Hep C Virus Ab Non Reactive Non Reactive  Hepatitis B surface antibody,quantitative   Collection Time: 03/29/24  1:56 PM  Result Value Ref Range   Hepatitis B Surf Ab Quant 92.1 Immunity>10 mIU/mL  Hepatitis B surface antigen   Collection Time: 03/29/24  1:56 PM  Result Value Ref Range   Hepatitis B Surface Ag Negative Negative  Iron    Collection Time: 03/29/24  1:56 PM  Result Value Ref Range   Iron  44 27 - 159 ug/dL  Folate   Collection Time: 03/29/24  1:56 PM  Result Value Ref Range   Folate 7.7 >3.0 ng/mL  Ferritin   Collection Time: 03/29/24  1:56 PM  Result Value Ref Range   Ferritin 38 15 - 150 ng/mL  VITAMIN D  25 Hydroxy (Vit-D Deficiency, Fractures)   Collection Time: 03/29/24  1:56 PM  Result Value Ref Range   Vit D, 25-Hydroxy 86.5 30.0 - 100.0 ng/mL  TSH   Collection Time: 03/29/24  1:56 PM  Result Value Ref Range   TSH 1.730 0.450 - 4.500 uIU/mL  Lipid Panel w/o Chol/HDL Ratio   Collection Time: 03/29/24  1:56 PM  Result Value Ref Range   Cholesterol, Total 223 (H) 100 - 199 mg/dL   Triglycerides 69 0 - 149 mg/dL   HDL 75 >60 mg/dL   VLDL Cholesterol Cal 12 5 - 40 mg/dL   LDL Chol Calc (NIH) 863 (H) 0 - 99 mg/dL  Comprehensive metabolic panel with GFR   Collection Time: 03/29/24  1:56 PM   Result Value Ref Range   Glucose 88 70 - 99 mg/dL   BUN 16 6 - 24 mg/dL   Creatinine, Ser 8.64 (H) 0.57 - 1.00 mg/dL   eGFR 46 (L) >40 fO/fpw/8.26   BUN/Creatinine Ratio 12 9 - 23   Sodium 138 134 - 144 mmol/L   Potassium 4.2 3.5 - 5.2 mmol/L   Chloride 101 96 - 106 mmol/L   CO2 22 20 - 29 mmol/L   Calcium 9.4 8.7 - 10.2 mg/dL   Total Protein 6.4 6.0 - 8.5 g/dL   Albumin 4.4 3.8 - 4.9 g/dL   Globulin, Total 2.0 1.5 - 4.5 g/dL   Bilirubin Total 0.3 0.0 - 1.2 mg/dL   Alkaline Phosphatase 86 44 - 121 IU/L   AST 43 (H) 0 - 40 IU/L   ALT 71 (H) 0 - 32 IU/L  CBC with Differential/Platelet   Collection Time: 03/29/24  1:56 PM  Result Value Ref Range   WBC 5.9 3.4 - 10.8 x10E3/uL   RBC 4.21 3.77 - 5.28 x10E6/uL   Hemoglobin 11.8 11.1 - 15.9 g/dL   Hematocrit 62.4 65.9 - 46.6 %   MCV 89 79 - 97 fL   MCH 28.0 26.6 - 33.0 pg   MCHC 31.5 31.5 - 35.7 g/dL   RDW 86.6 88.2 - 84.5 %   Platelets 229 150 - 450 x10E3/uL   Neutrophils 59 Not Estab. %   Lymphs 29 Not Estab. %  Monocytes 6 Not Estab. %   Eos 5 Not Estab. %   Basos 1 Not Estab. %   Neutrophils Absolute 3.4 1.4 - 7.0 x10E3/uL   Lymphocytes Absolute 1.7 0.7 - 3.1 x10E3/uL   Monocytes Absolute 0.4 0.1 - 0.9 x10E3/uL   EOS (ABSOLUTE) 0.3 0.0 - 0.4 x10E3/uL   Basophils Absolute 0.1 0.0 - 0.2 x10E3/uL   Immature Granulocytes 0 Not Estab. %   Immature Grans (Abs) 0.0 0.0 - 0.1 x10E3/uL      Assessment & Plan:   Problem List Items Addressed This Visit       Cardiovascular and Mediastinum   Hot flashes due to menopause   Ongoing issues, currently is doing well with Gabapentin , continue this and adjust as needed.  Could not take higher doses as made her tired.        Nervous and Auditory   Seizure disorder [facility]) - Primary   Initial in 2009 and returned in 2023 -- no further seizures since May 2023.  She questions if related to stressors.  Took herself off Lamictal  in past and has been stable.  Continue collaboration with  neurology as needed. Labs up to date.      Relevant Medications   gabapentin  (NEURONTIN ) 300 MG capsule     Other   Obesity   BMI 36.54.  Recommended eating smaller high protein, low fat meals more frequently and exercising 30 mins a day 5 times a week with a goal of 10-15lb weight loss in the next 3 months. Patient voiced their understanding and motivation to adhere to these recommendations.        Major depressive disorder, recurrent episode, moderate (HCC)   Chronic, followed by psychiatry.  Will continue current medication regimen as prescribed by psychiatry and continue to review their notes.        GAD (generalized anxiety disorder)   Refer to depression plan of care.      Other Visit Diagnoses       Encounter for screening mammogram for malignant neoplasm of breast       Mammogram ordered and instructed how to schedule.   Relevant Orders   MM 3D SCREENING MAMMOGRAM BILATERAL BREAST     Encounter for annual physical exam       Annual physical today with labs and health maintenance reviewed, discussed with patient.        Follow up plan: Return in about 6 months (around 10/09/2024) for Depression, ANXIETY, Seizures.   LABORATORY TESTING:  - Pap smear: does not have uterus -- 2012  IMMUNIZATIONS:   - Tdap: Tetanus vaccination status reviewed: last tetanus booster within 10 years. - Influenza: gets a work - Pneumovax: Not applicable - Prevnar: gets at work - COVID: Up to date - HPV: Not applicable - Shingrix vaccine: Refused  SCREENING: -Mammogram: Ordered today  - Colonoscopy: Up to date Cologuard 05/23/24 next - Bone Density: Not applicable  -Hearing Test: Not applicable  -Spirometry: Not applicable   PATIENT COUNSELING:   Advised to take 1 mg of folate supplement per day if capable of pregnancy.   Sexuality: Discussed sexually transmitted diseases, partner selection, use of condoms, avoidance of unintended pregnancy  and contraceptive alternatives.    Advised to avoid cigarette smoking.  I discussed with the patient that most people either abstain from alcohol or drink within safe limits (<=14/week and <=4 drinks/occasion for males, <=7/weeks and <= 3 drinks/occasion for females) and that the risk for alcohol disorders and other health effects rises  proportionally with the number of drinks per week and how often a drinker exceeds daily limits.  Discussed cessation/primary prevention of drug use and availability of treatment for abuse.   Diet: Encouraged to adjust caloric intake to maintain  or achieve ideal body weight, to reduce intake of dietary saturated fat and total fat, to limit sodium intake by avoiding high sodium foods and not adding table salt, and to maintain adequate dietary potassium and calcium preferably from fresh fruits, vegetables, and low-fat dairy products.    Stressed the importance of regular exercise  Injury prevention: Discussed safety belts, safety helmets, smoke detector, smoking near bedding or upholstery.   Dental health: Discussed importance of regular tooth brushing, flossing, and dental visits.    NEXT PREVENTATIVE PHYSICAL DUE IN 1 YEAR. Return in about 6 months (around 10/09/2024) for Depression, ANXIETY, Seizures.

## 2024-04-11 NOTE — Assessment & Plan Note (Signed)
 Ongoing issues, currently is doing well with Gabapentin , continue this and adjust as needed.  Could not take higher doses as made her tired.

## 2024-04-11 NOTE — Assessment & Plan Note (Signed)
 Initial in 2009 and returned in 2023 -- no further seizures since May 2023.  She questions if related to stressors.  Took herself off Lamictal  in past and has been stable.  Continue collaboration with neurology as needed. Labs up to date.

## 2024-04-11 NOTE — Assessment & Plan Note (Signed)
 Chronic, followed by psychiatry.  Will continue current medication regimen as prescribed by psychiatry and continue to review their notes.

## 2024-04-11 NOTE — Assessment & Plan Note (Signed)
 BMI 36.54.  Recommended eating smaller high protein, low fat meals more frequently and exercising 30 mins a day 5 times a week with a goal of 10-15lb weight loss in the next 3 months. Patient voiced their understanding and motivation to adhere to these recommendations.

## 2024-04-16 ENCOUNTER — Encounter: Payer: Self-pay | Admitting: Nurse Practitioner

## 2024-04-21 NOTE — Progress Notes (Signed)
 Virtual Visit via Video Note  I connected with Tracie James on 05/04/24 at 10:30 AM EDT by a video enabled telemedicine application and verified that I am speaking with the correct person using two identifiers.  Location: Patient: work Provider: home office Persons participated in the visit- patient, provider    I discussed the limitations of evaluation and management by telemedicine and the availability of in person appointments. The patient expressed understanding and agreed to proceed.    I discussed the assessment and treatment plan with the patient. The patient was provided an opportunity to ask questions and all were answered. The patient agreed with the plan and demonstrated an understanding of the instructions.   The patient was advised to call back or seek an in-person evaluation if the symptoms worsen or if the condition fails to improve as anticipated.    Tracie Sleet, MD    [facility] MD/PA/NP OP Progress Note  05/04/2024 12:00 PM Tracie James  MRN:  [id number]  Chief Complaint:  Chief Complaint  Patient presents with   Follow-up   HPI:  This is a follow-up appointment for depression, anxiety and insomnia.  She states that she has been doing good, and she feels like she is at where she should be.  She states that the work has been much better as she has been learning more.  She reports good support at work.  Her oldest daughter is working with her and that it is nice to see her there.  Her youngest daughter is looking for a job.  She is concerned that her daughter might have been made to take care of her step mother, who has cancer.  She has been trying to help her find the way.  She has been feeling tired, which she attributes to shortage of staff.  She has been working from 2 AM through 2 PM.  She has been able to get some rest when she does not work, although she has not been able to do much otherwise.  She denies feeling depressed or anxiety.  She has a good appetite.   She sleeps well.  She denies SI, HI, hallucinations.  She denies gait disturbances.  She has not taken hydroxyzine  or lorazepam  anymore.  She was able to see Ms. Perkins, and feels comfortable to continue the therapy.  She agrees with the plans as outlined below.   Employment: Designer, industrial/product Support: boyfriend of ten years Household: boyfriend of 11 years in 2022 Marital status: divorced Number of children: 2 daughters, oldest is 65 year old, will move to [place]   Substance use   Tobacco Alcohol Other substances/  Current  denies denies Denies since July 2025  Past      Vape THC a few times per day- since she was recommended by her daughter  Past Treatment             Visit Diagnosis:    ICD-10-CM   1. MDD (major depressive disorder), recurrent, in partial remission  F33.41     2. GAD (generalized anxiety disorder)  F41.1     3. Insomnia, unspecified type  G47.00       Past Psychiatric History: Please see initial evaluation for full details. I have reviewed the history. No updates at this time.     Past Medical History:  Past Medical History:  Diagnosis Date   Anxiety    Asthma    Bipolar 2 disorder (HCC)    Depression    GERD (gastroesophageal reflux disease)  Palpitations    PVC (premature ventricular contraction)     Past Surgical History:  Procedure Laterality Date   APPENDECTOMY     TOTAL ABDOMINAL HYSTERECTOMY      Family Psychiatric History: Please see initial evaluation for full details. I have reviewed the history. No updates at this time.     Family History:  Family History  Problem Relation Age of Onset   Depression Mother    Multiple sclerosis Mother    Schizophrenia Maternal Grandmother    Heart disease Maternal Grandmother        aortic valve replacement    Social History:  Social History   Socioeconomic History   Marital status: Divorced    Spouse name: Not on file   Number of children: Not on file   Years of education: Not on file   Highest  education level: Not on file  Occupational History   Not on file  Tobacco Use   Smoking status: Former    Current packs/day: 0.00    Types: Cigarettes    Quit date: 03/31/2014    Years since quitting: 10.1   Smokeless tobacco: Never  Vaping Use   Vaping status: Every Day  Substance and Sexual Activity   Alcohol use: No   Drug use: No   Sexual activity: Yes    Partners: Male    Birth control/protection: Surgical  Other Topics Concern   Not on file  Social History Narrative   Not on file   Social Drivers of Health   Financial Resource Strain: Not on file  Food Insecurity: No Food Insecurity (01/09/2024)   Hunger Vital Sign    Worried About Running Out of Food in the Last Year: Never true    Ran Out of Food in the Last Year: Never true  Transportation Needs: No Transportation Needs (01/09/2024)   PRAPARE - Administrator, Civil Service (Medical): No    Lack of Transportation (Non-Medical): No  Physical Activity: Not on file  Stress: Not on file  Social Connections: Socially Integrated (01/09/2024)   Social Connection and Isolation Panel    Frequency of Communication with Friends and Family: Twice a week    Frequency of Social Gatherings with Friends and Family: Twice a week    Attends Religious Services: More than 4 times per year    Active Member of [employer] or Organizations: Patient declined    Attends Banker Meetings: 1 to 4 times per year    Marital Status: Married    Allergies:  Allergies  Allergen Reactions   Erythromycin Hives   Tetracyclines & Related Hives    Metabolic Disorder Labs: Lab Results  Component Value Date   HGBA1C 5.0 01/11/2024   MPG 96.8 01/11/2024   No results found for: PROLACTIN Lab Results  Component Value Date   CHOL 223 (H) 03/29/2024   TRIG 69 03/29/2024   HDL 75 03/29/2024   CHOLHDL 4.0 01/13/2024   VLDL 24 01/13/2024   LDLCALC 136 (H) 03/29/2024   LDLCALC 148 (H) 01/13/2024   Lab Results  Component  Value Date   TSH 1.730 03/29/2024   TSH 2.284 01/11/2024    Therapeutic Level Labs: No results found for: LITHIUM No results found for: VALPROATE No results found for: CBMZ  Current Medications: Current Outpatient Medications  Medication Sig Dispense Refill   buPROPion  (WELLBUTRIN  XL) 300 MG 24 hr tablet Take 1 tablet (300 mg total) by mouth daily. 30 tablet 3   busPIRone  (BUSPAR )  10 MG tablet Take 1 tablet (10 mg total) by mouth 3 (three) times daily. 90 tablet 1   cholecalciferol  (CHOLECALCIFEROL ) 25 MCG tablet Take 5 tablets (5,000 Units total) by mouth daily. 30 tablet 0   ferrous sulfate  325 (65 FE) MG tablet Take 1 tablet (325 mg total) by mouth daily with breakfast. 30 tablet 3   [START ON 05/14/2024] FLUoxetine  (PROZAC ) 40 MG capsule Take 1 capsule (40 mg total) by mouth daily. 30 capsule 3   furosemide  (LASIX ) 20 MG tablet Take 1 tablet (20 mg total) by mouth daily. 30 tablet 3   gabapentin  (NEURONTIN ) 300 MG capsule Take 1 capsule (300 mg total) by mouth at bedtime. 90 capsule 3   hydrOXYzine  (ATARAX ) 25 MG tablet Take 1 tablet (25 mg total) by mouth daily as needed for anxiety. 30 tablet 1   LORazepam  (ATIVAN ) 1 MG tablet Take 1 mg by mouth every 8 (eight) hours as needed for anxiety.     metoprolol  succinate (TOPROL -XL) 50 MG 24 hr tablet Take 1 tablet (50 mg total) by mouth daily. Take with or immediately following a meal. 30 tablet 0   naproxen  (NAPROSYN ) 500 MG tablet Take 1 tablet (500 mg total) by mouth 2 (two) times daily with a meal. 180 tablet 1   ondansetron  (ZOFRAN ) 4 MG tablet Take 4 mg by mouth every 8 (eight) hours as needed for nausea or vomiting.     pantoprazole  (PROTONIX ) 40 MG tablet Take 1 tablet (40 mg total) by mouth at bedtime. 90 tablet 3   traZODone  (DESYREL ) 50 MG tablet Take 1 tablet (50 mg total) by mouth at bedtime as needed for sleep. 30 tablet 3   No current facility-administered medications for this visit.     Musculoskeletal: Strength  & Muscle Tone: N/A Gait & Station: N/A Patient leans: N/A  Psychiatric Specialty Exam: Review of Systems  Psychiatric/Behavioral:  Negative for agitation, behavioral problems, confusion, decreased concentration, dysphoric mood, hallucinations, self-injury, sleep disturbance and suicidal ideas. The patient is not nervous/anxious and is not hyperactive.   All other systems reviewed and are negative.   There were no vitals taken for this visit.There is no height or weight on file to calculate BMI.  General Appearance: Well Groomed  Eye Contact:  Good  Speech:  Clear and Coherent  Volume:  Normal  Mood:  good  Affect:  Appropriate, Congruent, and Full Range  Thought Process:  Coherent  Orientation:  Full (Time, Place, and Person)  Thought Content: Logical   Suicidal Thoughts:  No  Homicidal Thoughts:  No  Memory:  Immediate;   Good  Judgement:  Good  Insight:  Good  Psychomotor Activity:  Normal  Concentration:  Concentration: Good and Attention Span: Good  Recall:  Good  Fund of Knowledge: Good  Language: Good  Akathisia:  No  Handed:  Right  AIMS (if indicated): not done  Assets:  Communication Skills Desire for Improvement  ADL's:  Intact  Cognition: WNL  Sleep:  Good   Screenings: AUDIT    Flowsheet Row [facility]  Alcohol Use Disorder Identification Test Final Score (AUDIT) 0   GAD-7    Flowsheet Row Counselor from 04/24/2024 in [facility] Office Visit from 01/19/2024 in [facility] Office Visit from 11/04/2023 in [facility] Office Visit from 08/12/2023 in [facility] Office Visit from 06/23/2023 in [facility]  Total GAD-7 Score 21 21 21 21 21    PHQ2-9    Flowsheet Row Counselor from 04/24/2024 in [facility] Office Visit from 01/19/2024 in [facility] Office Visit from 11/04/2023 in [facility] Office Visit from 08/12/2023 in [facility] Office Visit from 06/23/2023 in [facility]  PHQ-2 Total Score 5 5 2 3 3   PHQ-9 Total Score 17 24 18 11 13    Flowsheet Row Counselor from 04/24/2024 in [facility] Office Visit from 01/19/2024 in [facility] Admission (Discharged[facility]  C-SSRS RISK CATEGORY Error: Q3, 4, or 5 should not be populated when Q2 is No Error: Q7 should not be populated when Q6 is No Moderate Risk     Assessment and Plan:  Tracie James is a 53 y.o. year old female with a history of depression, history of PVC, r/o seizure, who presents for follow up appointment for below.   1. MDD (major depressive disorder), recurrent, in partial remission 2. GAD (generalized anxiety disorder) She was originally on bupropion  300 mg daily, fluoxetine  80 mg daily, lurasidone  60 mg daily, lorazepam  1 mg prn at the initial visit. She has been on bupropion  450 mg daily since 06/2016. [facility] admission in 12/2023 with SI with thought to shoot herself in the head, OD medication. Although the patient received a diagnosis of bipolar disorder during her recent admission, she denies any significant history of hypomanic or manic episodes, with the exception of a brief two-day period of shopping to elevate mood. She also denies experiencing hallucinations, clarifying that her experiences were more aligned with internal thoughts rather than perceptual disturbances. Her overall clinical presentation is more consistent with depressive symptoms, and ongoing monitoring is recommended to further assess diagnostic clarity. There has been steady improvement in depressive symptoms  and anxiety since the last visit, although she reports some fatigue related to work overload/shortage of staff.  She feels more comfortable at the work, and feels good now that her oldest daughter works at the same place.  She continues to have close communication with her youngest daughter.  Will continue current medication regimen.  Will continue current dose of fluoxetine  to target depression and anxiety.  Will continue bupropion  as adjunctive treatment for depression.  She is aware of the risk of seizure.  Will continue BuSpar  for anxiety.  Will continue gabapentin  for anxiety and hot flashes.  It is noted that although hydroxyzine  and lorazepam  is available as needed for anxiety, she has not had the need to use this medication.   3. Insomnia, unspecified type - sleep study was not conclusive for sleep apnea in July 2023    Stable.  Will continue current dose of trazodone  as needed for insomnia.    4. High risk medication use There is a concern of history of seizure.  She will have an appointment in January and is on the waiting list to see a neurologist.    # LFT abnormality  According to the chart review, she has LFT abnormalities, although it appears to be peaked out.  Primary care was notified of this, and she will have a follow up.    # THC use She has been abstinent from [facility] use.  Will continue motivational interview.      Plan Continue fluoxetine  40 mg daily  Continue  bupropion  300 mg daily Continue gabapentin  300 mg at night (possible gait disturbance from higher dose Continue Buspar  10 mg three times a day  Continue hydroxyzine  25 mg twice a day as needed for anxiety- she declined a refill Continue lorazepam  1 mg as needed for anxiety - she rarely takes this medication Continue Trazodone  25-50 mg at night as needed for insomnia Next appointment - 12/19 at 9 30 for 30 mins, video - she sees Ms. Perkins for therapy   Past trials of medication: sertraline, fluoxetine , bupropion ,  duloxetine,, Lithium (sick), lamotrigine  (weight gain), Depakote (spending money), Latuda , Abilify  (did not work), quetiapine (weight gain, somnolence). Geodon  (PVC occurred), Vraylar    The patient demonstrates the following risk factors for suicide: Chronic risk factors for suicide include: psychiatric disorder of depression and previous suicide attempts of overdosing medication. Acute risk factors for suicide include: family or marital conflict. Protective factors for this patient include: responsibility to others (children, family), coping skills and hope for the future. Patient is future oriented and agrees to come for next appointment. Considering these factors, the overall suicide risk at this point appears to be low. She is future oriented, and is amenable to treatment. Patient is appropriate for outpatient follow up.    Collaboration of Care: Collaboration of Care: Other reviewed notes in Epic  Patient/Guardian was advised Release of Information must be obtained prior to any record release in order to collaborate their care with an outside provider. Patient/Guardian was advised if they have not already done so to contact the registration department to sign all necessary forms in order for us  to release information regarding their care.   Consent: Patient/Guardian gives verbal consent for treatment and assignment of benefits for services provided during this visit. Patient/Guardian expressed understanding and agreed to proceed.    Tracie Sleet, MD 05/04/2024, 12:00 PM

## 2024-04-24 ENCOUNTER — Ambulatory Visit (INDEPENDENT_AMBULATORY_CARE_PROVIDER_SITE_OTHER): Admitting: Licensed Clinical Social Worker

## 2024-04-24 DIAGNOSIS — F411 Generalized anxiety disorder: Secondary | ICD-10-CM

## 2024-04-24 DIAGNOSIS — F331 Major depressive disorder, recurrent, moderate: Secondary | ICD-10-CM

## 2024-04-24 NOTE — Progress Notes (Signed)
 Comprehensive Clinical Assessment (CCA) Note  04/24/2024 Tracie James [id number]  Chief Complaint:  Chief Complaint  Patient presents with   Anxiety   Depression   Visit Diagnosis: MDD (major depressive disorder), recurrent episode, moderate (HCC)  GAD (generalized anxiety disorder)  DIAGNOSTIC CRITERIA FOR Major Depressive Disorder  (DSM-5-TR):  A. Major Depressive Episode  Timeframe: Tracie James has experienced five (or more) symptoms during a two-week period, representing a change in functioning.  Core Symptom (at least one):   [x]  Depressed mood  [x]  Loss of interest/pleasure.  Additional Symptoms (at least three or four, depending on core symptoms):   [] Significant weight change or appetite change.       [x] Insomnia or hypersomnia. [] Psychomotor agitation or retardation. [x] Fatigue or loss of energy. [x] Feelings of worthlessness or excessive guilt. [x] Diminished ability to think or concentrate. [x] Recurrent thoughts of death or suicidal ideation/behavior.   B. Exclusionary Criteria Symptoms are due to a substance or medical condition.   [x]  No [] Yes  Symptoms are better explained by other psychotic disorders. [x]  No  [] Yes  History of manic or hypomanic episodes.   [x]  No  [] Yes  C. Clinical Significance Symptoms cause significant distress or impairment in functioning. [x]  Yes  D. Final Diagnosis DSM-5 criteria met for Major Depressive Disorder. [x]  Yes []  No  Episode Specifiers:  []  Single Episode  [x]  Recurrent Episode  Severity Specifiers:  [] Mild  [x] Moderate  [] Severe  []  with psychotic features []  in partial remission  [] in full remission  Other Specifiers:   [x]  With anxious distress  []  With mixed features []  With melancholic features   []  With atypical features  []  With mood congruent psychotic features   []  With mood incongruent psychotic features  []  With catatonia []  With peripartum onset  []  With seasonal pattern     DIAGNOSIS OF  GENERALIZED ANXIETY DISORDER (DSM-5-TR):  Based on clinical interview, [name]  meets diagnostic criteria for Generalized Anxiety Disorder.  [x]  Excessive anxiety and worry: Occurring more days than not for at least 6 months, about a number of events or activities (e.g., work, school, performance).  [x]  Difficult to control the worry  B. Associated symptoms: The anxiety and worry are associated with three (or more) of the following symptoms (only one is required for children), present for more days than not for the past 6 months:  [x]  Restlessness or feeling keyed up or on edge [x]  Being easily fatigued [x]  Difficulty concentrating or mind going blank [x]  Irritability [x]  Muscles tension [x] Sleep disturbance (difficulty falling or staying asleep, or restless, unsatisfying sleep)   C. Functional impact: [x]  The anxiety, worry, or physical symptoms cause clinically significant distress or impairment in social, occupational, or or other important areas of functioning.  D. Exclusion criteria: [x]  The disturbance is not due to the physiological effects of a substance or another medical condition. [x]  The disturbance is not better explained by another mental disorder.     CCA Biopsychosocial Intake/Chief Complaint:  Tracie James is a 53 year old female who presents alone to establish care with therapist, referred by psychiatrist, Dr Vickey.  Current Symptoms/Problems: Pt identifies sxs to include but not limited depressed mood,  anhedonia, fatigue, hx suicidal thoughts without plan, anxiety, Financial Extravagance, Irritable Mood, and Panic Symptoms.   Patient Reported Schizophrenia/Schizoaffective Diagnosis in Past: No   Strengths: No data recorded Preferences: No data recorded Abilities: Lab tech, computers, cooking   Type of Services Patient Feels are Needed: Individual Therapy  and Medication Management (Dr. Vickey)  Initial Clinical Notes/Concerns: Symptoms  started around her 20's after her mother moved, symptoms occur daily, symptoms are moderate    Mental Health Symptoms Depression:  Irritability; Tearfulness; Change in energy/activity; Difficulty Concentrating; Fatigue; Hopelessness; Sleep (too much or little); Worthlessness   Duration of Depressive symptoms: Greater than two weeks   Mania:  N/A   Anxiety:   Tension; Irritability; Worrying; Difficulty concentrating; Fatigue; Restlessness; Sleep   Psychosis:  None   Duration of Psychotic symptoms: No data recorded  Trauma:  N/A   Obsessions:  N/A   Compulsions:  N/A   Inattention:  N/A   Hyperactivity/Impulsivity:  N/A   Oppositional/Defiant Behaviors:  N/A   Emotional Irregularity:  N/A   Other Mood/Personality Symptoms:  None     Mental Status Exam Appearance and self-care  Stature:  Average   Weight:  Overweight   Clothing:  Casual   Grooming:  Normal   Cosmetic use:  Age appropriate   Posture/gait:  Normal   Motor activity:  Not Remarkable   Sensorium  Attention:  Normal   Concentration:  Normal   Orientation:  X5   Recall/memory:  Normal   Affect and Mood  Affect:  Appropriate   Mood:  Depressed   Relating  Eye contact:  Normal   Facial expression:  Responsive   Attitude toward examiner:  Cooperative   Thought and Language  Speech flow: Normal   Thought content:  Appropriate to Mood and Circumstances   Preoccupation:  None   Hallucinations:  None   Organization:  No data recorded  [employer] of Knowledge:  Average   Intelligence:  Average   Abstraction:  Normal   Judgement:  Normal   Reality Testing:  Adequate   Insight:  Good   Decision Making:  Normal   Social Functioning  Social Maturity:  Isolates   Social Judgement:  Normal   Stress  Stressors:  Family conflict; Grief/losses; Financial   Coping Ability:  Normal   Skill Deficits:  Activities of daily living   Supports:  Friends/Service  system     Religion: Religion/Spirituality Are You A Religious Person?: No How Might This Affect Treatment?: No impact  Leisure/Recreation: Leisure / Recreation Do You Have Hobbies?: Yes Leisure and Hobbies: scrapbooking, and making cards  Exercise/Diet: Exercise/Diet Do You Exercise?: No Have You Gained or Lost A Significant Amount of Weight in the Past Six Months?: No Do You Follow a Special Diet?: No Do You Have Any Trouble Sleeping?: No   CCA Employment/Education Employment/Work Situation: Employment / Work Situation Employment Situation: Employed 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?: No  Education: Education Last Grade Completed: 12 Name of High School: [employer]  Did Garment/textile technologist From [employer]?: Yes Did Theme park manager?: Yes What Type of College Degree Do you Have?: The patient notes she went to [employer] in Medical Technology Did You Attend Graduate School?: No What Was Your Major?: Medical Did You Have Any Special Interests In School?: Science  Did You Have An Individualized Education Program (IIEP): No  Did You Have Any Difficulty At School?: Yes Were Any Medications Ever Prescribed For These Difficulties?: No Patient's Education Has Been Impacted by Current Illness: No   CCA Family/Childhood History Family and Relationship History: Family history Marital status: Long term relationship Long term relationship, how long?: In a relationship with her boyfriend. What types of issues is patient dealing  with in the relationship?: The patient notes ongoing difficulty with her ex-husband Are you sexually active?: Yes What is your sexual orientation?: Heterosexual  Has your sexual activity been affected by drugs, alcohol, medication, or emotional stress?: None reported Does patient have children?: Yes How many children?: 2 How is patient's relationship with their children?: The patient notes she has 2 girls. The patient notes,  Pretty good relationship currently with the girls.  Childhood History:  Childhood History By whom was/is the patient raised?: Mother Additional childhood history information: Spent summers with father. Reports she has 2 brothers one biological and one stepbrother. Shares she does not speak with her biological. Reports she recently got close to her stepbrother in the past 10 years. He lives in [place] . Description of patient's relationship with caregiver when they were a child: Mother: typical relationship with mother,   Father:  good relationship. Patient's description of current relationship with people who raised him/her: Reports both her mother and step mother are deceased. Shares a strained relationship with her father. How were you disciplined when you got in trouble as a child/adolescent?: Spanked  Does patient have siblings?: Yes Number of Siblings: 2 Description of patient's current relationship with siblings: The patient notes she has a younger brother. The patient notes,  We are not speaking. States she has a step brother. Did patient suffer any verbal/emotional/physical/sexual abuse as a child?: No Has patient ever been sexually abused/assaulted/raped as an adolescent or adult?: No Witnessed domestic violence?: No Has patient been affected by domestic violence as an adult?: No  Child/Adolescent Assessment:     CCA Substance Use Alcohol/Drug Use: Alcohol / Drug Use Pain Medications: See MAR Prescriptions: See MAR Over the Counter: See MAR History  of alcohol / drug use?: No history of alcohol / drug abuse Longest period of sobriety (when/how long): NA      ASAM's:  Six Dimensions of Multidimensional Assessment  Dimension 1:  Acute Intoxication and/or Withdrawal Potential:      Dimension 2:  Biomedical Conditions and Complications:      Dimension 3:  Emotional, Behavioral, or Cognitive Conditions and Complications:     Dimension 4:  Readiness to Change:     Dimension 5:  Relapse, Continued use, or Continued Problem Potential:     Dimension 6:  Recovery/Living Environment:     ASAM Severity Score:    ASAM Recommended Level of Treatment:     Substance use Disorder (SUD)    Recommendations for Services/Supports/Treatments: Recommendations for Services/Supports/Treatments Recommendations For Services/Supports/Treatments: Individual Therapy, Medication Management  DSM5 Diagnoses: Patient Active Problem List   Diagnosis Date Noted   Hot flashes due to menopause 03/14/2024   GAD (generalized anxiety disorder) 01/09/2024   Iron  deficiency anemia 08/08/2023   Onychomycosis 04/25/2023   Seizure disorder (HCC) 10/26/2021   Fibromyalgia 08/11/2021   Medication monitoring encounter 09/08/2020   Chronic pain syndrome 05/09/2018   Major depressive disorder, recurrent episode, moderate (HCC) 05/28/2016   Obesity 05/27/2015   Palpitations 05/26/2015   Insomnia 05/26/2015   Vitamin D  deficiency 05/26/2015   History of Present Illness:   Tracie James is a 54 year old female who presents alone  to establish care with therapist, referred by psychiatrist, Dr Vickey. Pt identifies sxs to include but not limited depressed mood, anhedonia, fatigue, hx suicidal thoughts without plan, anxiety, Financial Extravagance, Irritable Mood, and Panic Symptoms. Per her chart, Past Psychiatric History includes a previous diagnosis of Outpatient bipolar II disorder since 73. Past admission following SI in [place]  for 3 days in 2010 and a previous  suicide attempt overdosing on pills 15 years ago (did not seek medical care). Per Chart, she endorses a history of impulsive purchases around $1,000 in 1990. She denies decreased need for sleep, euphoria, talkativeness. Pt was oriented times 5. Pt was cooperative and engaged. Pt denies SI/HI/AVH.     She reports in June through September reports hopelessness and worthless ness. Reports she still feels these feelings but they're not as string at this moment. Reports she identified she would take her life in the woods. Reports the last thought of this was at the beginning of September. Denies a plan. Positive factors included her family who live with her and in the house behind her.      **Stressors** On January 4th, 2020, the patient reported that her daughter was kidnapped by her ex-husband. Following this event, she was court-mandated to attend therapy. She shared that she never got her daughter back and had not spoken to her in 2.5 years due to her ex-husband's refusal to allow communication. She last spoke to her daughter when she turned 36, and they reunited in May 2025. The patient remains in contact with her oldest daughter.   On June 25, she underwent a dental surgical procedure. She reported that she did not eat or take her medications for 1.5 weeks. During the second week, she went to the emergency department and was admitted for care. Additionally, her stepmother passed away unexpectedly at the beginning of July.  For the past five years, she has been tearful, distressed, and grieving due to the situation with her daughter. She feels conflicted and confused because of her ex-husband and her daughter's family. The holidays have been especially anxiety-provoking due to the uncertainty surrounding their relationship. Reports she is very close to her family and they got me though the holiday last year and I didn't even know it until it was over.   The patient also reported that her relationships  with her brother, father, and deceased stepmother were ruined because they chose to believe information from other parties involved in previous custody disputes.   Reports she has been working over 60 hours a week to try and save for her daughter's plane ticket. Reports this is an added stressor.   Goals: Addressing grief related to her relationship with her daughter.   Patient Centered Plan: Patient is on the following Treatment Plan(s):  Depression   Referrals to Alternative Service(s): Referred to Alternative Service(s):   Place:   Date:   Time:    Referred to Alternative Service(s):   Place:   Date:   Time:    Referred to Alternative Service(s):   Place:   Date:   Time:    Referred to Alternative Service(s):   Place:   Date:   Time:      Collaboration of Care: AEB psychiatrist can access notes and cln. Will review psychiatrists' notes. Check in with the patient and will see LCSW per availability. Patient agreed with treatment recommendations.   Patient/Guardian was advised Release of Information must be obtained prior to any record release in order to collaborate their care with  an outside provider. Patient/Guardian was advised if they have not already done so to contact the registration department to sign all necessary forms in order for us  to release information regarding their care.   Consent: Patient/Guardian gives verbal consent for treatment and assignment of benefits for services provided during this visit. Patient/Guardian expressed understanding and agreed to proceed.   [name], LCSW

## 2024-05-03 ENCOUNTER — Encounter: Payer: Self-pay | Admitting: Nurse Practitioner

## 2024-05-03 MED ORDER — PANTOPRAZOLE SODIUM 40 MG PO TBEC: 40.0000 mg | DELAYED_RELEASE_TABLET | Freq: Every evening | ORAL | 3 refills | Status: DC

## 2024-05-04 ENCOUNTER — Telehealth (INDEPENDENT_AMBULATORY_CARE_PROVIDER_SITE_OTHER): Admitting: Psychiatry

## 2024-05-04 ENCOUNTER — Encounter: Payer: Self-pay | Admitting: Psychiatry

## 2024-05-04 DIAGNOSIS — F3341 Major depressive disorder, recurrent, in partial remission: Secondary | ICD-10-CM | POA: Diagnosis not present

## 2024-05-04 DIAGNOSIS — G4700 Insomnia, unspecified: Secondary | ICD-10-CM

## 2024-05-04 DIAGNOSIS — F411 Generalized anxiety disorder: Secondary | ICD-10-CM | POA: Diagnosis not present

## 2024-05-04 MED ORDER — FLUOXETINE HCL 40 MG PO CAPS: 40.0000 mg | ORAL_CAPSULE | Freq: Every day | ORAL | 3 refills | Status: DC

## 2024-05-04 NOTE — Patient Instructions (Signed)
 Continue fluoxetine  40 mg daily  Continue bupropion  300 mg daily Continue gabapentin  300 mg at night Continue Buspar  10 mg three times a day  Continue hydroxyzine  25 mg twice a day as needed for anxiety Continue lorazepam  1 mg as needed for anxiety  Continue Trazodone  25-50 mg at night as needed for insomnia Next appointment - 12/19 at 9 30

## 2024-05-16 ENCOUNTER — Ambulatory Visit: Admitting: Licensed Clinical Social Worker

## 2024-05-16 DIAGNOSIS — F3341 Major depressive disorder, recurrent, in partial remission: Secondary | ICD-10-CM

## 2024-05-16 DIAGNOSIS — G4700 Insomnia, unspecified: Secondary | ICD-10-CM

## 2024-05-16 DIAGNOSIS — F411 Generalized anxiety disorder: Secondary | ICD-10-CM

## 2024-05-16 NOTE — Progress Notes (Signed)
 THERAPIST PROGRESS NOTE  Session Time: 2:06pm-2:58pm  Participation Level: Active  Behavioral Response: CasualAlertEuthymic  Type of Therapy: Individual Therapy  Treatment Goals addressed:  Active     OP Depression     LTG: Reduce frequency, intensity, and duration of depression symptoms so that daily functioning is improved     Start:  04/24/24    Expected End:  07/25/24         LTG: Increase coping skills to manage depression and improve ability to perform daily activities     Start:  04/24/24    Expected End:  07/25/24         STG: Verble will identify cognitive patterns and beliefs that support depression     Start:  04/24/24    Expected End:  07/25/24         LTG: Address grief related to her relationship with her daughter.      Start:  04/24/24    Expected End:  07/25/24         Work with Karianna to identify the major components of a recent episode of depression: physical symptoms, major thoughts and images, and major behaviors they experienced     Start:  04/24/24         Coping Skills     Start:  04/24/24       Will work with the pt using CBT/DBT techniques to help the pt verbalize an understanding of the cognitive, physiological, and behavioral components of depression and its treatment. This will be done by using worksheets, interactive activities, CBT/ABC thought logs, modeling, homework, role playing and journaling. Will work with pt to learn and implement coping skills that result in a reduction of depression and improve daily functioning per pt self-report 3 out of 5 documented sessions.       Educate patient on: Stages of grief     Start:  04/24/24               ProgressTowards Goals: Initial  Interventions: CBT, Supportive, and Reframing  Summary: Tracie James is a 53 year old female who presents with sxs to include but not limited depressed mood, anhedonia, fatigue, hx suicidal thoughts without plan, anxiety, Financial Extravagance,  Irritable Mood, and Panic Symptoms. Per her chart, Past Psychiatric History includes a previous diagnosis of Outpatient bipolar II disorder since 17. Past admission following SI in [place]  for 3 days in 2010 and a previous suicide attempt overdosing on pills 15 years ago (did not seek medical care). Per Chart, she endorses a history of impulsive purchases around $1,000 in 1990. She denies decreased need for sleep, euphoria, talkativeness. Pt was oriented times 5. Pt was cooperative and engaged. Pt denies SI/HI/AVH.     Patient utilized therapeutic space to process changing in hours at work reporting she feels overwhelmed as her 1 day off often is consumed by parents and chores.  However, patient notes heat sleep has improved as she is getting 6+ hours of sleep at night.  Patient became tearful reflecting on recent experiences with grief triggered by her relationships with her children to which she feels out of control.  Patient reflected on frustration related to communication with her daughter which often leads her to ruminate on the worst case scenario and feeling she is on edge anytime they communicate.  Patient reports the root of her anxiety is often times playing out the custody case over and over and her memory.  Patient also identifies increased anxiety around rehearsing conversations with others.  Patient also identifies the impact of unresolved grief related to her mother's death identifying frequent unwanted thoughts.  Patient reports within her childhood emotional vulnerability was not modeled and therefore she was led to learn to suppress her feelings.  Patient also feels judged based on her experiences of expressing grief and others telling her to stop crying.  Clinician worked with patient to identify common negative beliefs affiliated with these feelings and her experiences with others with patient identifying she often believes I cannot trust others.  Clinician introduced patient to  treatment modality option of EMDR to work with patient on addressing negative cognitions related to experiences with others.  Clinician also provided brief psychoeducation on her window of tolerance, flight, fight, freeze trauma responses.  Patient will continue to research EMDR and assess if it is a treatment modality to which she would find effective.  Suicidal/Homicidal: Nowithout intent/plan  Therapist Response: Clinician utilized active reflection to create a safe space for patient to process recent life experiences. Clinician assessed for current symptoms, stressors, safety since last session.  Worked with patient through grief work and identifying negative cognitions related to unresolved grief and feeling misplaced shame related to her experience of grief.  Worked with patient to reframe negative cognitions about her trusting others through introduction of EMDR as a treatment modality.  Plan: Return again in 2 weeks.  Diagnosis: MDD (major depressive disorder), recurrent, in partial remission  GAD (generalized anxiety disorder)  Insomnia, unspecified type   Collaboration of Care: AEB psychiatrist can access notes and cln. Will review psychiatrists' notes. Check in with the patient and will see LCSW per availability. Patient agreed with treatment recommendations.   Patient/Guardian was advised Release of Information must be obtained prior to any record release in order to collaborate their care with an outside provider. Patient/Guardian was advised if they have not already done so to contact the registration department to sign all necessary forms in order for us  to release information regarding their care.   Consent: Patient/Guardian gives verbal consent for treatment and assignment of benefits for services provided during this visit. Patient/Guardian expressed understanding and agreed to proceed.   Tracie KATHEE Husband, LCSW 05/16/2024

## 2024-05-20 ENCOUNTER — Other Ambulatory Visit: Payer: Self-pay | Admitting: Nurse Practitioner

## 2024-05-22 NOTE — Telephone Encounter (Signed)
 Requested Prescriptions  Pending Prescriptions Disp Refills   furosemide  (LASIX ) 20 MG tablet [Pharmacy Med Name: FUROSEMIDE  20MG  TABLETS] 90 tablet 1    Sig: TAKE 1 TABLET(20 MG) BY MOUTH DAILY     Cardiovascular:  Diuretics - Loop Failed - 05/22/2024 11:20 AM      Failed - Cr in normal range and within 180 days    Creatinine, Ser  Date Value Ref Range Status  03/29/2024 1.35 (H) 0.57 - 1.00 mg/dL Final         Failed - Mg Level in normal range and within 180 days    No results found for: MG       Passed - K in normal range and within 180 days    Potassium  Date Value Ref Range Status  03/29/2024 4.2 3.5 - 5.2 mmol/L Final         Passed - Ca in normal range and within 180 days    Calcium  Date Value Ref Range Status  03/29/2024 9.4 8.7 - 10.2 mg/dL Final         Passed - Na in normal range and within 180 days    Sodium  Date Value Ref Range Status  03/29/2024 138 134 - 144 mmol/L Final         Passed - Cl in normal range and within 180 days    Chloride  Date Value Ref Range Status  03/29/2024 101 96 - 106 mmol/L Final         Passed - Last BP in normal range    BP Readings from Last 1 Encounters:  04/11/24 100/66         Passed - Valid encounter within last 6 months    Recent Outpatient Visits           1 month ago Seizure disorder (HCC)   [place] [facility] [place], Melanie T, NP   2 months ago Hot flashes due to menopause   [place] [facility] [place], Melanie T, NP   6 months ago Seizure disorder [facility])   [facility] [place], Melanie DASEN, NP

## 2024-05-23 ENCOUNTER — Other Ambulatory Visit: Payer: Self-pay | Admitting: Nurse Practitioner

## 2024-05-23 DIAGNOSIS — Z1211 Encounter for screening for malignant neoplasm of colon: Secondary | ICD-10-CM

## 2024-05-30 ENCOUNTER — Telehealth: Payer: Self-pay

## 2024-05-30 ENCOUNTER — Other Ambulatory Visit: Payer: Self-pay | Admitting: Psychiatry

## 2024-05-30 ENCOUNTER — Ambulatory Visit: Admitting: Licensed Clinical Social Worker

## 2024-05-30 ENCOUNTER — Other Ambulatory Visit: Payer: Self-pay | Admitting: Cardiology

## 2024-05-30 DIAGNOSIS — F411 Generalized anxiety disorder: Secondary | ICD-10-CM

## 2024-05-30 DIAGNOSIS — G4700 Insomnia, unspecified: Secondary | ICD-10-CM | POA: Diagnosis not present

## 2024-05-30 DIAGNOSIS — F3341 Major depressive disorder, recurrent, in partial remission: Secondary | ICD-10-CM | POA: Diagnosis not present

## 2024-05-30 MED ORDER — HYDROXYZINE HCL 25 MG PO TABS: 25.0000 mg | ORAL_TABLET | Freq: Every day | ORAL | 3 refills | Status: AC | PRN

## 2024-05-30 NOTE — Telephone Encounter (Signed)
 Ordered

## 2024-05-30 NOTE — Telephone Encounter (Signed)
 Request for a refill of  hydrOXYzine  (ATARAX ) 25 MG tablet     Last visit 05-04-24 Next visit 07-06-24    Preferred Pharmacies   [id number] - [place], [place] - [address] Phone: [phone]  Fax: [phone]

## 2024-05-30 NOTE — Progress Notes (Signed)
 THERAPIST PROGRESS NOTE  Session Time: 2:07pm-2:55pm  Participation Level: Active  Behavioral Response: CasualAlertEuthymic  Type of Therapy: Individual Therapy  Treatment Goals addressed:  Active     OP Depression     LTG: Reduce frequency, intensity, and duration of depression symptoms so that daily functioning is improved     Start:  04/24/24    Expected End:  07/25/24         LTG: Increase coping skills to manage depression and improve ability to perform daily activities     Start:  04/24/24    Expected End:  07/25/24         STG: Tracie James will identify cognitive patterns and beliefs that support depression     Start:  04/24/24    Expected End:  07/25/24         LTG: Address grief related to her relationship with her daughter.      Start:  04/24/24    Expected End:  07/25/24         Work with Tracie James to identify the major components of a recent episode of depression: physical symptoms, major thoughts and images, and major behaviors they experienced     Start:  04/24/24         Coping Skills     Start:  04/24/24       Will work with the pt using CBT/DBT techniques to help the pt verbalize an understanding of the cognitive, physiological, and behavioral components of depression and its treatment. This will be done by using worksheets, interactive activities, CBT/ABC thought logs, modeling, homework, role playing and journaling. Will work with pt to learn and implement coping skills that result in a reduction of depression and improve daily functioning per pt self-report 3 out of 5 documented sessions.       Educate patient on: Stages of grief     Start:  04/24/24               ProgressTowards Goals: Progressing  Interventions: Solution Focused, Supportive, and Other: Grief Work, Assertive communication   Summary: Tracie James is a 53 year old female who presents with sxs to include but not limited depressed mood, anhedonia, fatigue, hx suicidal  thoughts without plan, anxiety, Financial Extravagance, Irritable Mood, and Panic Symptoms. Per her chart, Past Psychiatric History includes a previous diagnosis of Outpatient bipolar II disorder since 89. Past admission following SI in [place]  for 3 days in 2010 and a previous suicide attempt overdosing on pills 15 years ago (did not seek medical care). Per Chart, she endorses a history of impulsive purchases around $1,000 in 1990. She denies decreased need for sleep, euphoria, talkativeness. Pt was oriented times 5. Pt was cooperative and engaged. Pt denies SI/HI/AVH.    Patient reflected on unresolved grief related to her recent daughter's birthday and reaching out to receive very limited responses.  Patient reflected on expectations for her relationship with her daughter and ways in which she is engaging in personalization in an effort to make sense of current dynamics.  Clinician worked with patient to challenge these thinking traps and explore use of assertive communication to address the unknown.  Reflected on controllable factors.  For homework, patient was asked to construct a no send letter to send to her daughter.  Suicidal/Homicidal: Nowithout intent/plan  Therapist Response: Clinician utilized active reflection to create a safe space for patient to process recent life experiences. Clinician assessed for current symptoms, stressors, safety since last session.  Worked with patient processing feelings  of grief related to the relationship with her daughter.  Challenged thinking traps that have contributed to blame and shame.  Plan: Return again in 2 weeks.  Diagnosis: MDD (major depressive disorder), recurrent, in partial remission  GAD (generalized anxiety disorder)  Insomnia, unspecified type   Collaboration of Care: : AEB psychiatrist can access notes and cln. Will review psychiatrists' notes. Check in with the patient and will see LCSW per availability. Patient agreed with treatment  recommendations.   Patient/Guardian was advised Release of Information must be obtained prior to any record release in order to collaborate their care with an outside provider. Patient/Guardian was advised if they have not already done so to contact the registration department to sign all necessary forms in order for us  to release information regarding their care.   Consent: Patient/Guardian gives verbal consent for treatment and assignment of benefits for services provided during this visit. Patient/Guardian expressed understanding and agreed to proceed.   Tracie James Husband, LCSW 05/30/2024

## 2024-05-31 ENCOUNTER — Telehealth: Payer: Self-pay

## 2024-05-31 NOTE — Telephone Encounter (Signed)
 disregard

## 2024-06-01 ENCOUNTER — Other Ambulatory Visit: Payer: Self-pay | Admitting: Cardiology

## 2024-06-01 ENCOUNTER — Telehealth: Payer: Self-pay | Admitting: Nurse Practitioner

## 2024-06-01 MED ORDER — METOPROLOL SUCCINATE ER 50 MG PO TB24: 50.0000 mg | ORAL_TABLET | Freq: Every day | ORAL | 1 refills | Status: AC

## 2024-06-01 MED ORDER — NAPROXEN 500 MG PO TABS: 500.0000 mg | ORAL_TABLET | Freq: Two times a day (BID) | ORAL | 1 refills | Status: AC

## 2024-06-01 MED ORDER — FUROSEMIDE 20 MG PO TABS: 20.0000 mg | ORAL_TABLET | Freq: Every day | ORAL | 1 refills | Status: AC

## 2024-06-01 NOTE — Telephone Encounter (Signed)
 Refills sent in

## 2024-06-04 ENCOUNTER — Telehealth: Payer: Self-pay

## 2024-06-04 ENCOUNTER — Other Ambulatory Visit: Payer: Self-pay | Admitting: Psychiatry

## 2024-06-04 MED ORDER — BUPROPION HCL ER (XL) 300 MG PO TB24: 300.0000 mg | ORAL_TABLET | Freq: Every day | ORAL | 3 refills | Status: DC

## 2024-06-04 NOTE — Telephone Encounter (Signed)
 Medication refill - Fax from pt's [facility] for a new Bupropion  XL, last orderd 02/14/24 + 3 refills and pt does not return until 07/06/24

## 2024-06-04 NOTE — Telephone Encounter (Signed)
 Ordered

## 2024-06-05 ENCOUNTER — Other Ambulatory Visit: Payer: Self-pay | Admitting: Psychiatry

## 2024-06-05 MED ORDER — BUPROPION HCL ER (XL) 300 MG PO TB24: 300.0000 mg | ORAL_TABLET | Freq: Every day | ORAL | 1 refills | Status: AC

## 2024-06-05 MED ORDER — TRAZODONE HCL 50 MG PO TABS: 50.0000 mg | ORAL_TABLET | Freq: Every evening | ORAL | 1 refills | Status: AC | PRN

## 2024-06-05 MED ORDER — FLUOXETINE HCL 40 MG PO CAPS: 40.0000 mg | ORAL_CAPSULE | Freq: Every day | ORAL | 1 refills | Status: AC

## 2024-06-05 MED ORDER — BUSPIRONE HCL 10 MG PO TABS: 10.0000 mg | ORAL_TABLET | Freq: Three times a day (TID) | ORAL | 0 refills | Status: AC

## 2024-06-05 NOTE — Telephone Encounter (Signed)
 Ordered at least 90 days of Fluoxetine , buspar , bupropion  trazodone  to [facility]. It seems like gabapentin  was ordered by her primary care, so I will defer the refill to them.

## 2024-06-05 NOTE — Telephone Encounter (Signed)
 Medication problem - Call with patient, after she left a message she was having to change her primary pharmacy again due to her insurance. Patient requested all new orders for her medications to now be sent to [facility] home delivery and requested Dr.Hisada send in all these new orders so they would be delivered to her prior to running out.  Agreed to send request to Dr. Hisada for all new orders to now be sent to [facility].

## 2024-06-06 ENCOUNTER — Telehealth: Payer: Self-pay | Admitting: Nurse Practitioner

## 2024-06-07 MED ORDER — PANTOPRAZOLE SODIUM 40 MG PO TBEC: 40.0000 mg | DELAYED_RELEASE_TABLET | Freq: Every evening | ORAL | 3 refills | Status: DC

## 2024-06-07 NOTE — Telephone Encounter (Signed)
 Refill sent to local pharmacy recently. Can we resend to mail order please?

## 2024-06-12 NOTE — Progress Notes (Unsigned)
 THERAPIST PROGRESS NOTE  Session Time: ***  Participation Level: Active  Behavioral Response: CasualAlertDepressed and Tearful  Type of Therapy: Individual Therapy  Treatment Goals addressed:  Active     OP Depression     LTG: Reduce frequency, intensity, and duration of depression symptoms so that daily functioning is improved     Start:  04/24/24    Expected End:  07/25/24         LTG: Increase coping skills to manage depression and improve ability to perform daily activities     Start:  04/24/24    Expected End:  07/25/24         STG: Mersadez will identify cognitive patterns and beliefs that support depression     Start:  04/24/24    Expected End:  07/25/24         LTG: Address grief related to her relationship with her daughter.      Start:  04/24/24    Expected End:  07/25/24         Work with Shakendra to identify the major components of a recent episode of depression: physical symptoms, major thoughts and images, and major behaviors they experienced     Start:  04/24/24         Coping Skills     Start:  04/24/24       Will work with the pt using CBT/DBT techniques to help the pt verbalize an understanding of the cognitive, physiological, and behavioral components of depression and its treatment. This will be done by using worksheets, interactive activities, CBT/ABC thought logs, modeling, homework, role playing and journaling. Will work with pt to learn and implement coping skills that result in a reduction of depression and improve daily functioning per pt self-report 3 out of 5 documented sessions.       Educate patient on: Stages of grief     Start:  04/24/24               ProgressTowards Goals: Progressing  Interventions: {CHL AMB BH Type of Intervention:21022753}  Summary:  Kriti Maciborski is a 53 year old female who presents with sxs to include but not limited depressed mood, anhedonia, fatigue, hx suicidal thoughts without plan, anxiety,  Financial Extravagance, Irritable Mood, and Panic Symptoms. Per her chart, Past Psychiatric History includes a previous diagnosis of Outpatient bipolar II disorder since 68. Past admission following SI in [place]  for 3 days in 2010 and a previous suicide attempt overdosing on pills 15 years ago (did not seek medical care). Per Chart, she endorses a history of impulsive purchases around $1,000 in 1990. She denies decreased need for sleep, euphoria, talkativeness. Pt was oriented times 5. Pt was cooperative and engaged. Pt denies SI/HI/AVH.    Suicidal/Homicidal: Nowithout intent/plan  Therapist Response:  Clinician utilized active reflection to create a safe space for patient to process recent life experiences. Clinician assessed for current symptoms, stressors, safety since last session.    Plan: Return again in 2 weeks.  Diagnosis: MDD (major depressive disorder), recurrent, in partial remission  GAD (generalized anxiety disorder)  Insomnia, unspecified type   Collaboration of Care: AEB psychiatrist can access notes and cln. Will review psychiatrists' notes. Check in with the patient and will see LCSW per availability. Patient agreed with treatment recommendations.   Patient/Guardian was advised Release of Information must be obtained prior to any record release in order to collaborate their care with an outside provider. Patient/Guardian was advised if they have not already done so to  contact the registration department to sign all necessary forms in order for us  to release information regarding their care.   Consent: Patient/Guardian gives verbal consent for treatment and assignment of benefits for services provided during this visit. Patient/Guardian expressed understanding and agreed to proceed.   [name], LCSW 06/12/2024

## 2024-06-13 ENCOUNTER — Encounter: Payer: Self-pay | Admitting: Licensed Clinical Social Worker

## 2024-06-13 ENCOUNTER — Ambulatory Visit: Admitting: Licensed Clinical Social Worker

## 2024-06-13 DIAGNOSIS — F411 Generalized anxiety disorder: Secondary | ICD-10-CM

## 2024-06-13 DIAGNOSIS — F3341 Major depressive disorder, recurrent, in partial remission: Secondary | ICD-10-CM

## 2024-06-13 DIAGNOSIS — G4700 Insomnia, unspecified: Secondary | ICD-10-CM

## 2024-06-13 NOTE — Progress Notes (Signed)
 THERAPIST PROGRESS NOTE  Session Time: 2:05 PM - 2:50 PM  Participation Level: Active  Behavioral Response: CasualAlertNegative, Tearful, hopeless  Type of Therapy: Individual Therapy  Treatment Goals addressed:  Active     OP Depression     LTG: Reduce frequency, intensity, and duration of depression symptoms so that daily functioning is improved     Start:  04/24/24    Expected End:  07/25/24         LTG: Increase coping skills to manage depression and improve ability to perform daily activities     Start:  04/24/24    Expected End:  07/25/24         STG: Tracie James will identify cognitive patterns and beliefs that support depression     Start:  04/24/24    Expected End:  07/25/24         LTG: Address grief related to her relationship with her daughter.      Start:  04/24/24    Expected End:  07/25/24         Work with [name] to identify the major components of a recent episode of depression: physical symptoms, major thoughts and images, and major behaviors they experienced     Start:  04/24/24         Coping Skills     Start:  04/24/24       Will work with the pt using CBT/DBT techniques to help the pt verbalize an understanding of the cognitive, physiological, and behavioral components of depression and its treatment. This will be done by using worksheets, interactive activities, CBT/ABC thought logs, modeling, homework, role playing and journaling. Will work with pt to learn and implement coping skills that result in a reduction of depression and improve daily functioning per pt self-report 3 out of 5 documented sessions.       Educate patient on: Stages of grief     Start:  04/24/24               ProgressTowards Goals: Progressing  Interventions: CBT, Supportive, and Reframing  Summary: Tracie James is a 53 year old female who presents with sxs to include but not limited depressed mood, anhedonia, fatigue, hx suicidal thoughts without plan,  anxiety, Financial Extravagance, Irritable Mood, and Panic Symptoms. Per her chart, Past Psychiatric History includes a previous diagnosis of Outpatient bipolar II disorder since 25. Past admission following SI in [place]  for 3 days in 2010 and a previous suicide attempt overdosing on pills 15 years ago (did not seek medical care). Per Chart, she endorses a history of impulsive purchases around $1,000 in 1990. She denies decreased need for sleep, euphoria, talkativeness. Pt was oriented times 5. Pt was cooperative and engaged. Pt denies SI/HI/AVH.     A staff member of outpatient MH in training was present by the name of Lauraine Ferrari.   Explored patients anger and hurt in recent dynamics with her daughter.   The patient reflected on barriers to progressing in her grief, particularly her fixation on the uncertainty surrounding why events have unfolded as they have. The clinician worked with the patient to shift her perspective, helping her accept that she may never fully understand her daughter's choices. We discussed factors within her control and explored ways for her to move forward despite her unresolved grief. The clinician assessed the negative belief system the patient was navigating, which was contributing to her difficulties in processing her relationship with her daughter. The patient identified an overarching negative belief: I am  not a good mother. The clinician then assisted the patient in evaluating evidence both for and against this belief.  For homework, patient will be sent CBT homework's to work through reframing to get the belief systems related to her role as a mother.  Suicidal/Homicidal: Nowithout intent/plan  Therapist Response: Clinician utilized active reflection to create a safe space for patient to process recent life experiences. Clinician assessed for current symptoms, stressors, safety since last session.  Utilized CBT to help patient redirect focused on controllable factors  and reframing negative cognitions about herself.  Plan: Return again in 2 weeks.  Diagnosis: MDD (major depressive disorder), recurrent, in partial remission  GAD (generalized anxiety disorder)  Insomnia, unspecified type  Collaboration of Care: AEB psychiatrist can access notes and cln. Will review psychiatrists' notes. Check in with the patient and will see LCSW per availability. Patient agreed with treatment recommendations.   Patient/Guardian was advised Release of Information must be obtained prior to any record release in order to collaborate their care with an outside provider. Patient/Guardian was advised if they have not already done so to contact the registration department to sign all necessary forms in order for us  to release information regarding their care.   Consent: Patient/Guardian gives verbal consent for treatment and assignment of benefits for services provided during this visit. Patient/Guardian expressed understanding and agreed to proceed.   Tracie KATHEE Husband, LCSW 06/13/2024

## 2024-06-18 ENCOUNTER — Encounter: Payer: Self-pay | Admitting: Nurse Practitioner

## 2024-06-18 ENCOUNTER — Ambulatory Visit
Admission: RE | Admit: 2024-06-18 | Discharge: 2024-06-18 | Disposition: A | Source: Ambulatory Visit | Attending: Nurse Practitioner | Admitting: Nurse Practitioner

## 2024-06-18 ENCOUNTER — Telehealth: Payer: Self-pay | Admitting: Nurse Practitioner

## 2024-06-18 DIAGNOSIS — Z1231 Encounter for screening mammogram for malignant neoplasm of breast: Secondary | ICD-10-CM

## 2024-06-18 NOTE — Telephone Encounter (Signed)
 Not due for refill. Will close encounter.

## 2024-06-18 NOTE — Telephone Encounter (Signed)
 [facility] New Prescription request urgent  gabapentin  (NEURONTIN ) 300 MG capsule 300 mg, Daily at bedtime

## 2024-06-19 ENCOUNTER — Other Ambulatory Visit: Payer: Self-pay | Admitting: Psychiatry

## 2024-06-19 LAB — COLOGUARD: COLOGUARD: NEGATIVE

## 2024-06-20 ENCOUNTER — Ambulatory Visit: Payer: Self-pay | Admitting: Nurse Practitioner

## 2024-06-20 ENCOUNTER — Ambulatory Visit: Admitting: Licensed Clinical Social Worker

## 2024-06-20 DIAGNOSIS — F3341 Major depressive disorder, recurrent, in partial remission: Secondary | ICD-10-CM | POA: Diagnosis not present

## 2024-06-20 DIAGNOSIS — F411 Generalized anxiety disorder: Secondary | ICD-10-CM

## 2024-06-20 DIAGNOSIS — G4700 Insomnia, unspecified: Secondary | ICD-10-CM | POA: Diagnosis not present

## 2024-06-20 NOTE — Progress Notes (Signed)
 THERAPIST PROGRESS NOTE  Session Time: 2:06pm-2:54pm  Participation Level: Active  Behavioral Response: CasualAlertEuthymic   Type of Therapy: Individual Therapy  Treatment Goals addressed:  Active     OP Depression     LTG: Reduce frequency, intensity, and duration of depression symptoms so that daily functioning is improved     Start:  04/24/24    Expected End:  07/25/24         LTG: Increase coping skills to manage depression and improve ability to perform daily activities     Start:  04/24/24    Expected End:  07/25/24         STG: Tracie James will identify cognitive patterns and beliefs that support depression     Start:  04/24/24    Expected End:  07/25/24         LTG: Address grief related to her relationship with her daughter.      Start:  04/24/24    Expected End:  07/25/24         Work with Tracie James to identify the major components of a recent episode of depression: physical symptoms, major thoughts and images, and major behaviors they experienced     Start:  04/24/24         Coping Skills     Start:  04/24/24       Will work with the pt using CBT/DBT techniques to help the pt verbalize an understanding of the cognitive, physiological, and behavioral components of depression and its treatment. This will be done by using worksheets, interactive activities, CBT/ABC thought logs, modeling, homework, role playing and journaling. Will work with pt to learn and implement coping skills that result in a reduction of depression and improve daily functioning per pt self-report 3 out of 5 documented sessions.       Educate patient on: Stages of grief     Start:  04/24/24               ProgressTowards Goals: Progressing  Interventions: CBT, Supportive, and Reframing  Summary: Tracie James is a 53 year old female who presents with sxs to include but not limited depressed mood, anhedonia, fatigue, hx suicidal thoughts without plan, anxiety, Financial  Extravagance, Irritable Mood, and Panic Symptoms. Per her chart, Past Psychiatric History includes a previous diagnosis of Outpatient bipolar II disorder since 43. Past admission following SI in [place]  for 3 days in 2010 and a previous suicide attempt overdosing on pills 15 years ago (did not seek medical care). Per Chart, she endorses a history of impulsive purchases around $1,000 in 1990. She denies decreased need for sleep, euphoria, talkativeness. Pt was oriented times 5. Pt was cooperative and engaged. Pt denies SI/HI/AVH.     The patient attended the session and reported an improvement in mood, highlighting her restful holiday break which she spent time with family and engaged in crafting as healthy outlets. However, she mentioned that she has been having trouble sleeping due to hip pain from arthritis, which has also affected her ability to walk. Despite this, the patient noted that after receiving treatment, she has experienced significant improvements in pain management.  During the session, she reflected on her efforts to reframe negative perspectives and focus on current situations that bring her joy. She acknowledged improvements in her self-care practices.  Suicidal/Homicidal: Nowithout intent/plan  Therapist Response: Clinician utilized active reflection to create a safe space for patient to process recent life experiences. Clinician assessed for current symptoms, stressors, safety since last session.  Reflected on patient's abilities to refocus negative belief systems and identify controllable factors such as self-care.  Plan: Return again in 2 weeks.  Diagnosis: MDD (major depressive disorder), recurrent, in partial remission  GAD (generalized anxiety disorder)  Insomnia, unspecified type   Collaboration of Care: AEB psychiatrist can access notes and cln. Will review psychiatrists' notes. Check in with the patient and will see LCSW per availability. Patient agreed with treatment  recommendations.   Patient/Guardian was advised Release of Information must be obtained prior to any record release in order to collaborate their care with an outside provider. Patient/Guardian was advised if they have not already done so to contact the registration department to sign all necessary forms in order for us  to release information regarding their care.   Consent: Patient/Guardian gives verbal consent for treatment and assignment of benefits for services provided during this visit. Patient/Guardian expressed understanding and agreed to proceed.   [name], LCSW 06/20/2024

## 2024-06-20 NOTE — Progress Notes (Signed)
 Contacted via MyChart  Negative Cologuard. Great news!! Repeat in 3 years.

## 2024-06-21 ENCOUNTER — Ambulatory Visit: Admitting: Licensed Clinical Social Worker

## 2024-06-22 ENCOUNTER — Ambulatory Visit: Payer: Self-pay | Admitting: Nurse Practitioner

## 2024-06-22 NOTE — Progress Notes (Signed)
 Contacted via MyChart   Normal mammogram, may repeat in one year:)

## 2024-06-25 ENCOUNTER — Encounter: Payer: Self-pay | Admitting: Nurse Practitioner

## 2024-06-25 MED ORDER — GABAPENTIN 300 MG PO CAPS: 300.0000 mg | ORAL_CAPSULE | Freq: Every day | ORAL | 3 refills | Status: AC

## 2024-06-25 MED ORDER — PANTOPRAZOLE SODIUM 40 MG PO TBEC: 40.0000 mg | DELAYED_RELEASE_TABLET | Freq: Every evening | ORAL | 3 refills | Status: AC

## 2024-07-01 NOTE — Progress Notes (Unsigned)
 Virtual Visit via Video Note  I connected with Tracie James on 07/06/2024 at  9:30 AM EST by a video enabled telemedicine application and verified that I am speaking with the correct person using two identifiers.  Location: Patient: home Provider: home office Persons participated in the visit- patient, provider    I discussed the limitations of evaluation and management by telemedicine and the availability of in person appointments. The patient expressed understanding and agreed to proceed.     I discussed the assessment and treatment plan with the patient. The patient was provided an opportunity to ask questions and all were answered. The patient agreed with the plan and demonstrated an understanding of the instructions.   The patient was advised to call back or seek an in-person evaluation if the symptoms worsen or if the condition fails to improve as anticipated.   Katheren Sleet, MD    [facility] MD/PA/NP OP Progress Note  07/06/2024 10:47 AM Tracie James  MRN:  [id number]  Chief Complaint:  Chief Complaint  Patient presents with   Follow-up   HPI:  - chart reviewed. She was seen by Dr. Lane  SEIZURE-LIKE ACTIVITY/ ANXIETY/ DEPRESSION  -Stable. -Patient last seen in office July 2023. Patient is here per Psychiatry. Psychiatry would like to confirm if patient has a seizure disorder and have had extended EEG testing. Last known seizure like spell was on 12/27/2021.  Has not taken Lamictal  in two years.  -Reviewed routine EEG from 12/27/2021, normal.  -Reviewed 3 hour extended EEG from 01/06/2022, normal.  -Discussed with patient that there is around an 80% chance that patient does not have epilepsy given previous normal routine and extended EEGs, as well as patient has been seizure free for many years without taking antiepileptics.   This is a follow-up appointment for depression, anxiety and insomnia.  She states that she has not spoken with Maurilio since May, and has not  received any response in email in the last few months.  She shares that her older daughter had a communication with Maurilio, and was told why don't you call your dad.  She took it that Maurilio does not want to speak with her.  She states that she went to [place]  to took both Hanna and Emma in 2016 when Marty had a big fight with her step mother. She did not want to return since then.  Although she advised Chiquita to call her father on Father's Day, she has not done so. Maurilio reportedly told her that she was not allowed to come on Christmas.  She states that she has days of crying, although she may feel okay on some days.  It happens every year around Christmas.  She has to keep herself busy.  She listens to music very loud as otherwise her thought wants to talk and she cannot concentrate.  She feels anxious.  She feels desperate although she denies SI.  She denies hallucinations.  She denies issues with gait disturbances.  She sleeps up to 6 hours.  She agrees with the plans as outlined.  Employment: designer, industrial/product Support: boyfriend of ten years Household: boyfriend of 11 years in 2022 Marital status: divorced Number of children: 2 daughters, oldest is 26 year old, will move to [place]   Substance use   Tobacco Alcohol Other substances/  Current  denies denies Denies since July 2025  Past      Vape THC a few times per day- since she was recommended by her daughter  Past Treatment  Visit Diagnosis:    ICD-10-CM   1. MDD (major depressive disorder), recurrent episode, mild  F33.0     2. GAD (generalized anxiety disorder)  F41.1     3. Insomnia, unspecified type  G47.00       Past Psychiatric History: Please see initial evaluation for full details. I have reviewed the history. No updates at this time.     Past Medical History:  Past Medical History:  Diagnosis Date   Anxiety    Asthma    Bipolar 2 disorder (HCC)    Depression    GERD (gastroesophageal reflux disease)    Palpitations    PVC  (premature ventricular contraction)     Past Surgical History:  Procedure Laterality Date   APPENDECTOMY     TOTAL ABDOMINAL HYSTERECTOMY      Family Psychiatric History: Please see initial evaluation for full details. I have reviewed the history. No updates at this time.     Family History:  Family History  Problem Relation Age of Onset   Depression Mother    Multiple sclerosis Mother    Schizophrenia Maternal Grandmother    Heart disease Maternal Grandmother        aortic valve replacement    Social History:  Social History   Socioeconomic History   Marital status: Divorced    Spouse name: Not on file   Number of children: Not on file   Years of education: Not on file   Highest education level: Not on file  Occupational History   Not on file  Tobacco Use   Smoking status: Former    Current packs/day: 0.00    Types: Cigarettes    Quit date: 03/31/2014    Years since quitting: 10.2   Smokeless tobacco: Never  Vaping Use   Vaping status: Every Day  Substance and Sexual Activity   Alcohol use: No   Drug use: No   Sexual activity: Yes    Partners: Male    Birth control/protection: Surgical  Other Topics Concern   Not on file  Social History Narrative   Not on file   Social Drivers of Health   Tobacco Use: Medium Risk (07/06/2024)   Patient History    Smoking Tobacco Use: Former    Smokeless Tobacco Use: Never    Passive Exposure: Not on Actuary Strain: Not on file  Food Insecurity: No Food Insecurity (01/09/2024)   Epic    Worried About Radiation Protection Practitioner of Food in the Last Year: Never true    Ran Out of Food in the Last Year: Never true  Transportation Needs: No Transportation Needs (01/09/2024)   Epic    Lack of Transportation (Medical): No    Lack of Transportation (Non-Medical): No  Physical Activity: Not on file  Stress: Not on file  Social Connections: Socially Integrated (01/09/2024)   Social Connection and Isolation Panel     Frequency of Communication with Friends and Family: Twice a week    Frequency of Social Gatherings with Friends and Family: Twice a week    Attends Religious Services: More than 4 times per year    Active Member of Clubs or Organizations: Patient declined    Attends Banker Meetings: 1 to 4 times per year    Marital Status: Married  Depression (PHQ2-9): High Risk (04/24/2024)   Depression (PHQ2-9)    PHQ-2 Score: 17  Alcohol Screen: Low Risk (01/09/2024)   Alcohol Screen    Last Alcohol Screening Score (  AUDIT): 0  Housing: Unknown (05/15/2024)   Received from [facility]   Epic    Unable to Pay for Housing in the Last Year: Not on file    Number of Times Moved in the Last Year: Not on file    At any time in the past 12 months, were you homeless or living in a shelter (including now)?: No  Utilities: Not At Risk (01/09/2024)   Epic    Threatened with loss of utilities: No  Health Literacy: Not on file    Allergies: Allergies[1]  Metabolic Disorder Labs: Lab Results  Component Value Date   HGBA1C 5.0 01/11/2024   MPG 96.8 01/11/2024   No results found for: PROLACTIN Lab Results  Component Value Date   CHOL 223 (H) 03/29/2024   TRIG 69 03/29/2024   HDL 75 03/29/2024   CHOLHDL 4.0 01/13/2024   VLDL 24 01/13/2024   LDLCALC 136 (H) 03/29/2024   LDLCALC 148 (H) 01/13/2024   Lab Results  Component Value Date   TSH 1.730 03/29/2024   TSH 2.284 01/11/2024    Therapeutic Level Labs: No results found for: LITHIUM No results found for: VALPROATE No results found for: CBMZ  Current Medications: Current Outpatient Medications  Medication Sig Dispense Refill   busPIRone  (BUSPAR ) 15 MG tablet Take 1 tablet (15 mg total) by mouth 3 (three) times daily. 270 tablet 0   buPROPion  (WELLBUTRIN  XL) 300 MG 24 hr tablet Take 1 tablet (300 mg total) by mouth daily. 90 tablet 1   busPIRone  (BUSPAR ) 10 MG tablet Take 1 tablet (10 mg total) by mouth 3  (three) times daily. (Patient not taking: Reported on 07/06/2024) 270 tablet 0   cholecalciferol  (CHOLECALCIFEROL ) 25 MCG tablet Take 5 tablets (5,000 Units total) by mouth daily. 30 tablet 0   ferrous sulfate  325 (65 FE) MG tablet Take 1 tablet (325 mg total) by mouth daily with breakfast. 30 tablet 3   FLUoxetine  (PROZAC ) 40 MG capsule Take 1 capsule (40 mg total) by mouth daily. 90 capsule 1   furosemide  (LASIX ) 20 MG tablet Take 1 tablet (20 mg total) by mouth daily. 90 tablet 1   gabapentin  (NEURONTIN ) 300 MG capsule Take 1 capsule (300 mg total) by mouth at bedtime. 90 capsule 3   hydrOXYzine  (ATARAX ) 25 MG tablet Take 1 tablet (25 mg total) by mouth daily as needed for anxiety. 30 tablet 3   LORazepam  (ATIVAN ) 1 MG tablet Take 1 mg by mouth every 8 (eight) hours as needed for anxiety.     metoprolol  succinate (TOPROL -XL) 50 MG 24 hr tablet Take 1 tablet (50 mg total) by mouth daily. Take with or immediately following a meal. 90 tablet 1   naproxen  (NAPROSYN ) 500 MG tablet Take 1 tablet (500 mg total) by mouth 2 (two) times daily with a meal. 180 tablet 1   ondansetron  (ZOFRAN ) 4 MG tablet Take 4 mg by mouth every 8 (eight) hours as needed for nausea or vomiting.     pantoprazole  (PROTONIX ) 40 MG tablet Take 1 tablet (40 mg total) by mouth at bedtime. 90 tablet 3   traZODone  (DESYREL ) 50 MG tablet Take 1 tablet (50 mg total) by mouth at bedtime as needed for sleep. 90 tablet 1   No current facility-administered medications for this visit.     Musculoskeletal: Strength & Muscle Tone: N/A Gait & Station: N/A Patient leans: N/A  Psychiatric Specialty Exam: Review of Systems  Psychiatric/Behavioral:  Positive for decreased concentration and dysphoric  mood. Negative for agitation, behavioral problems, confusion, hallucinations, self-injury, sleep disturbance and suicidal ideas. The patient is nervous/anxious. The patient is not hyperactive.   All other systems reviewed and are negative.    There were no vitals taken for this visit.There is no height or weight on file to calculate BMI.  General Appearance: Well Groomed  Eye Contact:  Good  Speech:  Clear and Coherent  Volume:  Normal  Mood:  not good  Affect:  Appropriate, Congruent, and Tearful  Thought Process:  Coherent  Orientation:  Full (Time, Place, and Person)  Thought Content: Logical   Suicidal Thoughts:  No  Homicidal Thoughts:  No  Memory:  Immediate;   Good  Judgement:  Good  Insight:  Good  Psychomotor Activity:  Normal  Concentration:  Concentration: Good and Attention Span: Good  Recall:  Good  Fund of Knowledge: Good  Language: Good  Akathisia:  No  Handed:  Right  AIMS (if indicated): not done  Assets:  Communication Skills Desire for Improvement  ADL's:  Intact  Cognition: WNL  Sleep:  Fair   Screenings: AUDIT    Flowsheet Row [facility]  Alcohol Use Disorder Identification Test Final Score (AUDIT) 0   GAD-7    Flowsheet Row Counselor from 04/24/2024 in [facility] Office Visit from 01/19/2024 in [facility] Office Visit from 11/04/2023 in [facility] Office Visit from 08/12/2023 in [facility] Visit from 06/23/2023 in [facility]  Total GAD-7 Score 21 21 21 21 21    PHQ2-9    Flowsheet Row Counselor from 04/24/2024 in [facility] Office Visit from 01[facility]19/2024 in [facility] Office Visit from 11/04/2023 in [facility] Office Visit from 08/12/2023 in [facility] Office Visit from 06/23/2023 in [facility]  PHQ-2 Total Score 5 5 2 3 3   PHQ-9 Total Score 17 24 18 11 13    Flowsheet Row Counselor from 04/24/2024 in  [facility] Office Visit from 01/19/2024 in [facility] Admission (Discharged[facility]  C-SSRS RISK CATEGORY Error: Q3, 4, or 5 should not be populated when Q2 is No Error: Q7 should not be populated when Q6 is No Moderate Risk     Assessment and Plan:  Tracie James is a 53 y.o. female with a history of depression, history of PVC, r/o seizure (seen by Dr. Lane 05/2024  80% chance that patient does not have epilepsy given previous normal routine and extended EEGs), who presents for follow up appointment for below.   1. MDD (major depressive disorder), recurrent, mild 2. GAD (generalized anxiety disorder) She was originally on bupropion  300 mg daily, fluoxetine  80 mg daily, lurasidone  60 mg daily, lorazepam  1 mg prn at the initial visit. She has been on bupropion  450 mg daily since 06/2016. [facility] admission in 12/2023 with SI with thought to shoot herself in the head, OD medication. Although the patient received a diagnosis of bipolar disorder during her recent admission, she denies any significant history of hypomanic or manic episodes, with the exception of a brief two-day period of shopping to elevate mood. She also denies experiencing hallucinations, clarifying that her experiences were more aligned with internal thoughts rather than perceptual  disturbances. Her overall clinical presentation is more consistent with depressive symptoms, and ongoing monitoring is recommended to further assess diagnostic clarity.  The exam is notable for tearful affect.  She reports worsening in depressive symptoms and anxiety in the context of not hearing back from her daughter.  This has been affecting her work.  Will uptitrate BuSpar  to optimize treatment for anxiety given it has been highly beneficial.  Will continue fluoxetine  to target depression and anxiety, along with bupropion   as adjunctive treatment for depression.  Will not plan to uptitrate at this time given her history of possible seizure.  Will continue gabapentin  for anxiety and hot flashes. It is noted that although hydroxyzine  and lorazepam  is available as needed for anxiety, she has not had the need to use this medication.   # Insomnia - sleep study was not conclusive for sleep apnea in July 2023    Overall stable.  Will continue current dose of trazodone  as needed for insomnia.    # LFT abnormality  According to the chart review, she has LFT abnormalities, although it appears to be peaked out.  Primary care was notified of this, and she will have a follow up.    # THC use She has been abstinent from [facility] use.  Will continue motivational interview.      Plan - optum Continue fluoxetine  40 mg daily  Continue bupropion  300 mg daily Continue gabapentin  300 mg at night (possible gait disturbance from higher dose Increase Buspar  15 mg three times a day  Continue hydroxyzine  25 mg twice a day as needed for anxiety- she declined a refill Continue lorazepam  1 mg as needed for anxiety - she rarely takes this medication Continue Trazodone  25-50 mg at night as needed for insomnia Next appointment - 2/6 at 9 am, video - she sees Ms. Perkins for therapy   Past trials of medication: sertraline, fluoxetine , bupropion , duloxetine,, Lithium (sick), lamotrigine  (weight gain), Depakote (spending money), Latuda , Abilify  (did not work), quetiapine (weight gain, somnolence). Geodon  (PVC occurred), Vraylar    The patient demonstrates the following risk factors for suicide: Chronic risk factors for suicide include: psychiatric disorder of depression and previous suicide attempts of overdosing medication. Acute risk factors for suicide include: family or marital conflict. Protective factors for this patient include: responsibility to others (children, family), coping skills and hope for the future. Patient is future oriented and  agrees to come for next appointment. Considering these factors, the overall suicide risk at this point appears to be low. She is future oriented, and is amenable to treatment. Patient is appropriate for outpatient follow up.    Collaboration of Care: Collaboration of Care: Other reviewed notes in Epic  Patient/Guardian was advised Release of Information must be obtained prior to any record release in order to collaborate their care with an outside provider. Patient/Guardian was advised if they have not already done so to contact the registration department to sign all necessary forms in order for us  to release information regarding their care.   Consent: Patient/Guardian gives verbal consent for treatment and assignment of benefits for services provided during this visit. Patient/Guardian expressed understanding and agreed to proceed.    Katheren Sleet, MD 07/06/2024, 10:47 AM     [1]  Allergies Allergen Reactions   Erythromycin Hives   Tetracyclines & Related Hives

## 2024-07-06 ENCOUNTER — Encounter: Payer: Self-pay | Admitting: Psychiatry

## 2024-07-06 ENCOUNTER — Telehealth: Admitting: Psychiatry

## 2024-07-06 DIAGNOSIS — F330 Major depressive disorder, recurrent, mild: Secondary | ICD-10-CM | POA: Diagnosis not present

## 2024-07-06 DIAGNOSIS — Z79899 Other long term (current) drug therapy: Secondary | ICD-10-CM

## 2024-07-06 DIAGNOSIS — F411 Generalized anxiety disorder: Secondary | ICD-10-CM | POA: Diagnosis not present

## 2024-07-06 DIAGNOSIS — G4700 Insomnia, unspecified: Secondary | ICD-10-CM | POA: Diagnosis not present

## 2024-07-06 MED ORDER — BUSPIRONE HCL 15 MG PO TABS: 15.0000 mg | ORAL_TABLET | Freq: Three times a day (TID) | ORAL | 0 refills | Status: DC

## 2024-07-06 NOTE — Patient Instructions (Signed)
 Continue fluoxetine  40 mg daily  Continue bupropion  300 mg daily Continue gabapentin  300 mg at night  Increase Buspar  15 mg three times a day  Continue hydroxyzine  25 mg twice a day as needed for anxiety Continue lorazepam  1 mg as needed for anxiety  Continue Trazodone  25-50 mg at night as needed for insomnia Next appointment - 2/6 at 9 am

## 2024-07-09 ENCOUNTER — Ambulatory Visit: Admitting: Licensed Clinical Social Worker

## 2024-07-09 DIAGNOSIS — G4700 Insomnia, unspecified: Secondary | ICD-10-CM

## 2024-07-09 DIAGNOSIS — F411 Generalized anxiety disorder: Secondary | ICD-10-CM | POA: Diagnosis not present

## 2024-07-09 DIAGNOSIS — F3341 Major depressive disorder, recurrent, in partial remission: Secondary | ICD-10-CM

## 2024-07-09 NOTE — Progress Notes (Signed)
 "  THERAPIST PROGRESS NOTE  Virtual Visit via Video Note  I connected with Tracie James on 07/09/2024 at  3:00 PM EST by a video enabled telemedicine application and verified that I am speaking with the correct person using two identifiers.  Location: Patient: Home Address on file Provider: Providers Address   I discussed the limitations of evaluation and management by telemedicine and the availability of in person appointments. The patient expressed understanding and agreed to proceed.   I discussed the assessment and treatment plan with the patient. The patient was provided an opportunity to ask questions and all were answered. The patient agreed with the plan and demonstrated an understanding of the instructions.   The patient was advised to call back or seek an in-person evaluation if the symptoms worsen or if the condition fails to improve as anticipated.  I provided 55 minutes of non-face-to-face time during this encounter.   [name], LCSW   Session Time: 3-3:55pm  Participation Level: Active  Behavioral Response: CasualAlertEuthymic  Type of Therapy: Individual Therapy  Treatment Goals addressed:  Active     OP Depression     LTG: Reduce frequency, intensity, and duration of depression symptoms so that daily functioning is improved     Start:  04/24/24    Expected End:  07/25/24         LTG: Increase coping skills to manage depression and improve ability to perform daily activities     Start:  04/24/24    Expected End:  07/25/24         STG: Clotine will identify cognitive patterns and beliefs that support depression     Start:  04/24/24    Expected End:  07/25/24         LTG: Address grief related to her relationship with her daughter.      Start:  04/24/24    Expected End:  07/25/24         Work with Genea to identify the major components of a recent episode of depression: physical symptoms, major thoughts and images, and major behaviors  they experienced     Start:  04/24/24         Coping Skills     Start:  04/24/24       Will work with the pt using CBT/DBT techniques to help the pt verbalize an understanding of the cognitive, physiological, and behavioral components of depression and its treatment. This will be done by using worksheets, interactive activities, CBT/ABC thought logs, modeling, homework, role playing and journaling. Will work with pt to learn and implement coping skills that result in a reduction of depression and improve daily functioning per pt self-report 3 out of 5 documented sessions.       Educate patient on: Stages of grief     Start:  04/24/24               ProgressTowards Goals: Progressing  Interventions: Supportive and Other: Psycoeducation, reframing  Summary:  Tracie James is a 53 year old female who presents with sxs to include but not limited depressed mood, anhedonia, fatigue, hx suicidal thoughts without plan, anxiety, Financial Extravagance, Irritable Mood, and Panic Symptoms. Per her chart, Past Psychiatric History includes a previous diagnosis of Outpatient bipolar II disorder since 10. Past admission following SI in [place]  for 3 days in 2010 and a previous suicide attempt overdosing on pills 15 years ago (did not seek medical care). Per Chart, she endorses a history of impulsive purchases around $1,000  in 1990. She denies decreased need for sleep, euphoria, talkativeness. Pt was oriented times 5. Pt was cooperative and engaged. Pt denies SI/HI/AVH.     Reflected on anger related to her relationship with her daughter. Shares she hates the holidays because it reminds her of who is not here.   Reflected on grief related to the first christmas without her step mother and worries about her father.   Psychiatrist increased Buspar  due to depressive symptoms and concentration. Reports she has to listen to loud music due to loud anxious thoughts or scenarios.   Reflected on  patient's attachment style with patient reporting she feels she is avoidant dismissive and avoidant fearful.  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  Suicidal/Homicidal: Nowithout intent/plan  Therapist Response: Clinician utilized active reflection to create a safe space for patient to process recent life experiences. Clinician assessed for current symptoms, stressors, safety since last session.  Clinician provided psychoeducation on attachment styles in an effort for patient to understand her responses to her personal relationships when faced with feelings of rejection.  Explored patient's relationship with herself with patient reporting low self-esteem.  Assisted patient in identifying a goal to address improvement with self-esteem and future sessions.  Plan: Return again in 2 weeks.  Diagnosis: MDD (major depressive disorder), recurrent, in partial remission  GAD (generalized anxiety disorder)  Insomnia, unspecified type   Collaboration of Care: AEB psychiatrist can access notes and cln. Will review psychiatrists' notes. Check in with the patient and will see LCSW per availability. Patient agreed with treatment recommendations.   Patient/Guardian was advised Release of Information must be obtained prior to any record release in order to collaborate their care with an outside provider. Patient/Guardian was advised if they have not already done so to contact the registration department to sign all necessary forms in order for us  to release information regarding their care.   Consent: Patient/Guardian gives verbal consent for treatment and assignment of benefits for services provided during this visit. Patient/Guardian expressed understanding and agreed to proceed.   [name], LCSW 07/09/2024  "

## 2024-07-10 ENCOUNTER — Ambulatory Visit: Admitting: Licensed Clinical Social Worker

## 2024-07-13 ENCOUNTER — Encounter: Payer: Self-pay | Admitting: Nurse Practitioner

## 2024-07-16 MED ORDER — ONDANSETRON HCL 4 MG PO TABS: 4.0000 mg | ORAL_TABLET | Freq: Three times a day (TID) | ORAL | 5 refills | Status: AC | PRN

## 2024-07-20 ENCOUNTER — Encounter: Payer: Self-pay | Admitting: Nurse Practitioner

## 2024-07-20 ENCOUNTER — Telehealth: Admitting: Nurse Practitioner

## 2024-07-20 DIAGNOSIS — R413 Other amnesia: Secondary | ICD-10-CM | POA: Diagnosis not present

## 2024-07-20 NOTE — Patient Instructions (Signed)
 Menopause: What to Know Menopause is the time in your life when your menstrual periods stop. It marks the end of your ability to get pregnant. It can be defined as not having a period for 12 months without another medical cause. The time when you start to move into menopause is called perimenopause. It often happens between ages 67-55. It can last for many years. During perimenopause, hormone levels change in your body. This can cause symptoms and affect your health. Menopause may make you more likely to have: Bones that are weak and break more easily. Depression. This is when you feel sad or hopeless. Arteries that harden and get narrow. These can cause heart attacks and strokes. What are the causes? In most cases, menopause is a natural change to your body and hormone levels that happens as you get older. But in some cases, it may be caused by changes that aren't natural. These include: Surgery to take out both ovaries. Side effects from some medicines. What increases the risk? You're more likely to go through menopause early if: You have an abnormal growth (tumor) of the pituitary gland in your brain. You have a disease that affects your ovaries. You've had certain treatments for cancer. These include: Chemotherapy. Hormone therapy. Radiation therapy on the area between your hips (pelvis). You smoke a lot or drink a lot of alcohol. Other people in your family have gone through menopause early. You're very thin. What are the signs or symptoms? You may have: Hot flashes. Irregular periods. Night sweats. Changes in how you feel about sex. You may: Have less of a sex drive. Feel more discomfort around your sexuality. Vaginal dryness and thinning of the vaginal walls. This may make it hurt to have sex. Skin changes, such as: Dry skin. New wrinkles. Headaches. Other symptoms may include: Trouble sleeping. Mood swings. Memory problems. Weight gain. Hair growth on your face and  chest. Bladder infections or trouble peeing. How is this diagnosed? You may be diagnosed based on: Your medical history. An exam. Your age. Your history of menstrual periods. Your symptoms. Hormone tests. How is this treated? In some cases, no treatment is needed. Talk with your health care provider about if you should get treated. Treatments may include: Menopausal hormone therapy (MHT). Medicines to treat certain symptoms. Acupuncture. Vitamin or herbal supplements. Before you start treatment, let your provider know if you or anyone in your family has or has had: Heart disease. Breast cancer. Blood clots. Diabetes. Osteoporosis. Follow these instructions at home: Eating and drinking  Eat a balanced diet. It should include: Fresh fruits and vegetables. Whole grains. Lean protein. Low-fat dairy. Eat lots of foods that have calcium and vitamin D in them. These can help keep your bones healthy. Foods and drinks that are rich in calcium include: Yogurt and low-fat milk. Beans. Almonds. Sardines. Broccoli and kale. To help prevent hot flashes, stay away from: Alcohol. Drinks with caffeine in them. Spicy foods. Lifestyle Do not smoke, vape, or use nicotine or tobacco. Get 7-8 hours of sleep each night. If you have hot flashes, you may want to: Dress in layers. Avoid things that may trigger hot flashes, like warm places or stress. Take slow, deep breaths when a hot flash starts. Keep a fan in your home and office. Find ways to manage stress. You may want to try: Deep breathing. Meditation. Writing in a journal. Ask your provider about going to group therapy. Therapy can help you get support from others who are going  through menopause. General instructions  Talk with your provider before you take any herbal supplements. Keep track of your symptoms. Track: When they start. How often you have them. How long they last. Use vaginal lubricants or moisturizers. These  can help with: Vaginal dryness. Comfort during sex. Contact a health care provider if: You're older than 55 and still get periods. You have pain during sex. You haven't had a period for 12 months and then start to bleed from your vagina. It hurts to pee. You get very bad headaches. Get help right away if: You're very depressed. You have a lot of bleeding from your vagina. Your heart is beating too fast. You have very bad belly pain or indigestion that doesn't go away with medicines. This information is not intended to replace advice given to you by your health care provider. Make sure you discuss any questions you have with your health care provider. Document Revised: 03/10/2023 Document Reviewed: 03/10/2023 Elsevier Patient Education  2024 [employer].

## 2024-07-20 NOTE — Assessment & Plan Note (Signed)
 For 6 months with worsening symptoms. No other red flag symptoms. Is have menopausal changes, ?if related to this. Will check labs outpatient, discussed with patient: CBC, CMP, TSH, B12, FSH/LH, Estrogen, Progesterone, RPR, and A1c.  Will determine next steps after all labs have returned.  If labs reassuring will consider imaging, MRI, to further assess OR possibly sleep study.

## 2024-07-20 NOTE — Progress Notes (Signed)
 "  LMP  (LMP Unknown)    Subjective:    Patient ID: Tracie James, female    DOB: 1971-05-25, 54 y.o.   MRN: [id number]  HPI: Tracie James is a 54 y.o. female  Chief Complaint  Patient presents with   Memory Loss    Patient states she has been noticing short term memory issues for the last 6 months. States she is starting to be concerned as this has not improved.    Virtual Visit via Video Note  I connected with Kynsli Hoganson on 07/20/2024 at  3:20 PM EST by a video enabled telemedicine application and verified that I am speaking with the correct person using two identifiers.  Location: Patient: home Provider: work   I discussed the limitations of evaluation and management by telemedicine and the availability of in person appointments. The patient expressed understanding and agreed to proceed.  I discussed the assessment and treatment plan with the patient. The patient was provided an opportunity to ask questions and all were answered. The patient agreed with the plan and demonstrated an understanding of the instructions.   The patient was advised to call back or seek an in-person evaluation if the symptoms worsen or if the condition fails to improve as anticipated.  I provided 25 minutes of non-face-to-face time during this encounter.   Malu Pellegrini T Logon [employer], NP   MEMORY CHANGES Has noticed these ongoing over the past 6 months, this is not improving. Recently was driving in car and having argument in head with self about what she had appointment for. Is forgetting things often. Cannot what she went into room for. No episodes of getting lost or forgetting where she was going, but forgets why she was going places. No changes in routine at home. Is forgetting conversations, not remembering what people told her. These changes are a lot more than she used to.    Does not have menstrual cycles, had hysterectomy in 2012 at [facility]. She does have hair loss, hot flashes, and other  symptoms of menopause. Follows with psychiatry for mood, last visit on 07/06/24.  She did see neurology in past for history of seizures, last visit 01/06/22. Recent or current viral symptoms: no History of vasovagal episodes: no Nausea: no Vomiting: no Tinnitus: no Hearing loss: no Aural fullness: no Headache: no Unsteady gait: no Postural instability: no Diplopia, dysarthria, dysphagia or weakness: no Related to exertion: no Pallor: no Diaphoresis: no Dyspnea: no Chest pain: no      04/24/2024    2:02 PM 01/19/2024    6:49 PM 11/04/2023    1:55 PM 08/12/2023    2:32 PM 06/23/2023    8:40 AM  Depression screen PHQ 2/9  Decreased Interest   1 2 2   Down, Depressed, Hopeless   1 1 1   PHQ - 2 Score   2 3 3   Altered sleeping   2 0 2  Tired, decreased energy   3 2 2   Change in appetite   2 0 2  Feeling bad or failure about yourself    2 2 3   Trouble concentrating   3 1 0  Moving slowly or fidgety/restless   3 2 0  Suicidal thoughts   1 1 1   PHQ-9 Score   18  11  13    Difficult doing work/chores   Somewhat difficult Somewhat difficult Somewhat difficult     Information is confidential and restricted. Go to Review Flowsheets to unlock data.   Data saved with a  previous flowsheet row definition       04/24/2024    2:03 PM 01/19/2024    6:50 PM 11/04/2023    1:54 PM 08/12/2023    2:33 PM  GAD 7 : Generalized Anxiety Score  Nervous, Anxious, on Edge   3 3  Control/stop worrying   3 3  Worry too much - different things   3 3  Trouble relaxing   3 3  Restless   3 3  Easily annoyed or irritable   3 3  Afraid - awful might happen   3 3  Total GAD 7 Score   21 21  Anxiety Difficulty   Very difficult Somewhat difficult     Information is confidential and restricted. Go to Review Flowsheets to unlock data.        07/20/2024    3:45 PM  6CIT Screen  What Year? 0 points  What month? 0 points  What time? 0 points  Count back from 20 0 points  Months in reverse 0 points  Repeat  phrase 2 points  Total Score 2 points   Relevant past medical, surgical, family and social history reviewed and updated as indicated. Interim medical history since our last visit reviewed. Allergies and medications reviewed and updated.  Review of Systems  Constitutional:  Positive for fatigue. Negative for activity change, appetite change, diaphoresis and fever.  Respiratory:  Negative for cough, chest tightness, shortness of breath and wheezing.   Cardiovascular:  Negative for chest pain, palpitations and leg swelling.  Gastrointestinal: Negative.   Endocrine: Negative.   Neurological:  Negative for dizziness, tremors, seizures, facial asymmetry, speech difficulty, weakness, light-headedness and headaches.  Psychiatric/Behavioral:  Positive for decreased concentration and sleep disturbance. Negative for self-injury and suicidal ideas. The patient is nervous/anxious.     Per HPI unless specifically indicated above     Objective:    LMP  (LMP Unknown)   Wt Readings from Last 3 Encounters:  04/11/24 213 lb (96.6 kg)  01/09/24 200 lb (90.7 kg)  11/17/23 217 lb (98.4 kg)    Physical Exam Vitals and nursing note reviewed.  Constitutional:      General: She is awake. She is not in acute distress.    Appearance: She is well-developed and well-groomed. She is obese. She is not ill-appearing or toxic-appearing.  HENT:     Head: Normocephalic.     Right Ear: Hearing normal.     Left Ear: Hearing normal.  Eyes:     General: Lids are normal.        Right eye: No discharge.        Left eye: No discharge.     Conjunctiva/sclera: Conjunctivae normal.  Pulmonary:     Effort: Pulmonary effort is normal. No accessory muscle usage or respiratory distress.  Musculoskeletal:     Cervical back: Normal range of motion.  Neurological:     Mental Status: She is alert and oriented to person, place, and time.  Psychiatric:        Attention and Perception: Attention normal.        Mood and  Affect: Mood normal.        Behavior: Behavior normal. Behavior is cooperative.        Thought Content: Thought content normal.        Judgment: Judgment normal.     Results for orders placed or performed in visit on 05/23/24  Cologuard   Collection Time: 06/12/24  6:53 PM  Result Value Ref  Range   COLOGUARD Negative Negative      Assessment & Plan:   Problem List Items Addressed This Visit       Other   Memory changes - Primary   For 6 months with worsening symptoms. No other red flag symptoms. Is have menopausal changes, ?if related to this. Will check labs outpatient, discussed with patient: CBC, CMP, TSH, B12, FSH/LH, Estrogen, Progesterone, RPR, and A1c.  Will determine next steps after all labs have returned.  If labs reassuring will consider imaging, MRI, to further assess OR possibly sleep study.      Relevant Orders   CBC with Differential/Platelet   Comprehensive metabolic panel with GFR   TSH   Vitamin B12   FSH/LH   Estrogens, total   Progesterone   RPR w/reflex to TrepSure   HgB A1c     Follow up plan: Return for plan to follow-up after return of all labs and determine next steps.      "

## 2024-07-24 ENCOUNTER — Other Ambulatory Visit

## 2024-07-25 ENCOUNTER — Ambulatory Visit: Payer: Self-pay | Admitting: Nurse Practitioner

## 2024-07-25 ENCOUNTER — Ambulatory Visit (INDEPENDENT_AMBULATORY_CARE_PROVIDER_SITE_OTHER): Admitting: Licensed Clinical Social Worker

## 2024-07-25 DIAGNOSIS — F3341 Major depressive disorder, recurrent, in partial remission: Secondary | ICD-10-CM

## 2024-07-25 DIAGNOSIS — F411 Generalized anxiety disorder: Secondary | ICD-10-CM | POA: Diagnosis not present

## 2024-07-25 DIAGNOSIS — G4700 Insomnia, unspecified: Secondary | ICD-10-CM

## 2024-07-25 LAB — COMPREHENSIVE METABOLIC PANEL WITH GFR
ALT: 42 IU/L — ABNORMAL HIGH (ref 0–32)
AST: 28 IU/L (ref 0–40)
Albumin: 4.4 g/dL (ref 3.8–4.9)
Alkaline Phosphatase: 99 IU/L (ref 49–135)
BUN/Creatinine Ratio: 15 (ref 9–23)
BUN: 16 mg/dL (ref 6–24)
Bilirubin Total: 0.3 mg/dL (ref 0.0–1.2)
CO2: 22 mmol/L (ref 20–29)
Calcium: 10 mg/dL (ref 8.7–10.2)
Chloride: 99 mmol/L (ref 96–106)
Creatinine, Ser: 1.06 mg/dL — ABNORMAL HIGH (ref 0.57–1.00)
Globulin, Total: 2.2 g/dL (ref 1.5–4.5)
Glucose: 83 mg/dL (ref 70–99)
Potassium: 4.2 mmol/L (ref 3.5–5.2)
Sodium: 138 mmol/L (ref 134–144)
Total Protein: 6.6 g/dL (ref 6.0–8.5)
eGFR: 62 mL/min/1.73

## 2024-07-25 LAB — CBC WITH DIFFERENTIAL/PLATELET
Basophils Absolute: 0 x10E3/uL (ref 0.0–0.2)
Basos: 0 %
EOS (ABSOLUTE): 0 x10E3/uL (ref 0.0–0.4)
Eos: 0 %
Hematocrit: 38.9 % (ref 34.0–46.6)
Hemoglobin: 12.9 g/dL (ref 11.1–15.9)
Immature Grans (Abs): 0 x10E3/uL (ref 0.0–0.1)
Immature Granulocytes: 0 %
Lymphocytes Absolute: 1.5 x10E3/uL (ref 0.7–3.1)
Lymphs: 13 %
MCH: 30.4 pg (ref 26.6–33.0)
MCHC: 33.2 g/dL (ref 31.5–35.7)
MCV: 92 fL (ref 79–97)
Monocytes Absolute: 0.5 x10E3/uL (ref 0.1–0.9)
Monocytes: 4 %
Neutrophils Absolute: 9.2 x10E3/uL — ABNORMAL HIGH (ref 1.4–7.0)
Neutrophils: 83 %
Platelets: 248 x10E3/uL (ref 150–450)
RBC: 4.25 x10E6/uL (ref 3.77–5.28)
RDW: 13.7 % (ref 11.7–15.4)
WBC: 11.3 x10E3/uL — ABNORMAL HIGH (ref 3.4–10.8)

## 2024-07-25 LAB — ESTROGENS, TOTAL: Estrogen: 138 pg/mL (ref 40–244)

## 2024-07-25 LAB — HEMOGLOBIN A1C
Est. average glucose Bld gHb Est-mCnc: 111 mg/dL
Hgb A1c MFr Bld: 5.5 % (ref 4.8–5.6)

## 2024-07-25 LAB — PROGESTERONE: Progesterone: 0.3 ng/mL

## 2024-07-25 LAB — VITAMIN B12: Vitamin B-12: >2000 pg/mL — ABNORMAL HIGH (ref 232–1245)

## 2024-07-25 LAB — RPR W/REFLEX TO TREPSURE: RPR: NONREACTIVE

## 2024-07-25 LAB — TREPONEMAL ANTIBODIES, TPPA: Treponemal Antibodies, TPPA: NONREACTIVE

## 2024-07-25 LAB — TSH: TSH: 1.29 u[IU]/mL (ref 0.450–4.500)

## 2024-07-25 LAB — FSH/LH
FSH: 87 m[IU]/mL
LH: 52.4 m[IU]/mL

## 2024-07-25 NOTE — Progress Notes (Signed)
 RPR nonreactive

## 2024-07-25 NOTE — Progress Notes (Signed)
 Contacted via MyChart  Good afternoon Tracie James, your labs are starting to return: - CBC is showing some mild elevation in white blood cell count and neutrophils. Have you been sick recently? - Kidney function, creatinine and eGFR, remains normal. Liver function tests are improving, only mild elevation in ALT. Great news!! - B12 level is a bit too high, if taking supplement cut back to every other day. - Hormone tests show you are most likely post menopausal, however as we discussed those hormone shifts and symptoms can still go on for a bit. - Diabetes and thyroid testing are normal. - Waiting on a couple more labs. Any questions? Keep being stellar!!  Thank you for allowing me to participate in your care.  I appreciate you. Kindest regards, Quantae Martel

## 2024-07-25 NOTE — Progress Notes (Signed)
 "  THERAPIST PROGRESS NOTE  Session Time: 3:04pm-3:54pm  Participation Level: Active   Behavioral Response: CasualAlertEuthymic   Type of Therapy: Individual Therapy   Treatment Goals addressed:  Active       OP Depression       LTG: Reduce frequency, intensity, and duration of depression symptoms so that daily functioning is improved       Start:  04/24/24    Expected End:  07/25/24           LTG: Increase coping skills to manage depression and improve ability to perform daily activities       Start:  04/24/24    Expected End:  07/25/24           STG: [name] will identify cognitive patterns and beliefs that support depression       Start:  04/24/24    Expected End:  07/25/24           LTG: Address grief related to her relationship with her daughter.        Start:  04/24/24    Expected End:  07/25/24           Work with Monquie to identify the major components of a recent episode of depression: physical symptoms, major thoughts and images, and major behaviors they experienced       Start:  04/24/24           Coping Skills       Start:  04/24/24       Will work with the pt using CBT/DBT techniques to help the pt verbalize an understanding of the cognitive, physiological, and behavioral components of depression and its treatment. This will be done by using worksheets, interactive activities, CBT/ABC thought logs, modeling, homework, role playing and journaling. Will work with pt to learn and implement coping skills that result in a reduction of depression and improve daily functioning per pt self-report 3 out of 5 documented sessions.          Educate patient on: Stages of grief       Start:  04/24/24                    ProgressTowards Goals: Progressing   Interventions: Supportive and Other: Psycoeducation, reframing   Summary:  Tracie James is a 54 year old female who presents with sxs to include but not limited depressed mood, anhedonia, fatigue, hx  suicidal thoughts without plan, anxiety, Financial Extravagance, Irritable Mood, and Panic Symptoms. Per her chart, Past Psychiatric History includes a previous diagnosis of Outpatient bipolar II disorder since 40. Past admission following SI in [place]  for 3 days in 2010 and a previous suicide attempt overdosing on pills 15 years ago (did not seek medical care). Per Chart, she endorses a history of impulsive purchases around $1,000 in 1990. She denies decreased need for sleep, euphoria, talkativeness. Pt was oriented times 5. Pt was cooperative and engaged. Pt denies SI/HI/AVH.     The patient used the therapeutic session to address feelings of being overwhelmed after the holiday season. She reported that she has not been able to catch up on sleep due to changes in her work schedule. The patient mentioned that her medication was increased, which has helped reduce her racing thoughts, but this change has also resulted in increased fatigue.  The patient reflected on feelings of misplaced guilt concerning her current dynamics with her daughter. She examined factors within her control and explored strategies to challenge her guilt-related thoughts.  Additionally, the patient reflected on feelings of grief associated with her mother's death, which were intensified during the holiday season.  Suicidal/Homicidal: Nowithout intent/plan   Therapist Response: Clinician utilized active reflection to create a safe space for patient to process recent life experiences. Clinician assessed for current symptoms, stressors, safety since last session.  Clinician worked with patient to reframe his place thoughts about guilt related to her relationship with her daughter identifying she cannot control others behaviors.  Worked with patient to them explore ways in which she can improve self-care and address more opportunities for rest.   Plan: Return again in 2 weeks.   Diagnosis: MDD (major depressive disorder), recurrent, in  partial remission   GAD (generalized anxiety disorder)   Insomnia, unspecified type  Patient/Guardian was advised Release of Information must be obtained prior to any record release in order to collaborate their care with an outside provider. Patient/Guardian was advised if they have not already done so to contact the registration department to sign all necessary forms in order for us  to release information regarding their care.   Consent: Patient/Guardian gives verbal consent for treatment and assignment of benefits for services provided during this visit. Patient/Guardian expressed understanding and agreed to proceed.   [name], LCSW 07/25/2024  "

## 2024-08-07 NOTE — Progress Notes (Unsigned)
 "  THERAPIST PROGRESS NOTE  Session Time: 2-2:56pm  Participation Level: Active   Behavioral Response: CasualAlertEuthymic   Type of Therapy: Individual Therapy   Participation Level: Active  Behavioral Response: CasualAlertNegative, Hopeless, and Tearful  Type of Therapy: Individual Therapy  Treatment Goals addressed:  Active     OP Depression     LTG: Reduce frequency, intensity, and duration of depression symptoms so that daily functioning is improved (Not Progressing)     Start:  04/24/24    Expected End:  07/25/24         LTG: Increase coping skills to manage depression and improve ability to perform daily activities (Not Progressing)     Start:  04/24/24    Expected End:  07/25/24         STG: Tracie James will identify cognitive patterns and beliefs that support depression (Not Progressing)     Start:  04/24/24    Expected End:  07/25/24         LTG: Address grief related to her relationship with her daughter.  (Not Progressing)     Start:  04/24/24    Expected End:  07/25/24         Work with Tracie James to identify the major components of a recent episode of depression: physical symptoms, major thoughts and images, and major behaviors they experienced     Start:  04/24/24         Coping Skills     Start:  04/24/24       Will work with the pt using CBT/DBT techniques to help the pt verbalize an understanding of the cognitive, physiological, and behavioral components of depression and its treatment. This will be done by using worksheets, interactive activities, CBT/ABC thought logs, modeling, homework, role playing and journaling. Will work with pt to learn and implement coping skills that result in a reduction of depression and improve daily functioning per pt self-report 3 out of 5 documented sessions.       Educate patient on: Stages of grief     Start:  04/24/24               ProgressTowards Goals: Progressing  Interventions: Supportive and Other:  Safety planning  Summary:  Tracie James is a 54 year old female who presents with sxs to include but not limited depressed mood, anhedonia, fatigue, hx suicidal thoughts without plan, anxiety, Financial Extravagance, Irritable Mood, and Panic Symptoms. Per her chart, Past Psychiatric History includes a previous diagnosis of Outpatient bipolar II disorder since 44. Past admission following SI in [place]  for 3 days in 2010 and a previous suicide attempt overdosing on pills 15 years ago (did not seek medical care). Per Chart, she endorses a history of impulsive purchases around $1,000 in 1990. She denies decreased need for sleep, euphoria, talkativeness. Pt was oriented times 5. Pt was cooperative and engaged. Pt denies SI/HI/AVH.   Cln utilized the first half of session to review patients progress. See progress notes documented above. The patient reports SI today stating she thought  what could I do to go? Denies plan or intent. Reports she has had these thoughts daily. Shares concerns she is starting to mess up at work due to difficulties concentrating. Reports she has not seen Dr Vickey in a while. States when she had her last appointment, she was reporting SI, which is why her Buspar  was increased. Pt agreed to a referral to IOP.   The clinician readministered the PHQ-9 and GAD-7 assessments. The patient's anxiety scores  decreased from 21 to 18, and depression scores also increased from 17 to 25. The patient shares maybe the medication has helped her mood. Reports she engaging in self care.  As far as sleep patient reports she is not getting deep sleeping as she waked fatigued, irritable, cannot shut off her thoughts, drifts off but knows she is not all the way asleep. Reports trazodone  is not effective.   Reports chronic emptiness from childhood but is unsure of the cause citing she had a fulfilling childhood. Reports she was born into and married into a military family. Reports she moved around a  lot and did not have opportunities to connect. Through exploration identified isolation as a coping skills she leanred from childhood due to her moves.  Clinician and patient will continue to explore this further in future sessions.   Completed with: Tracie James Date: 08/08/2024 2:43 PM   Patient expressed: Suicidal ideation  Increased risk due to: Prior attempts, Feelings of hopelessness and/or worthlessness, Recent stressful life event, Feelings of depression and/or anhedonia, and Recent bereavement or loss of a relationship  Mitigating factors include: Sense of responsibility to family Employed Living with another person, especially a relative Positive therapeutic relationship   Warning Signs discussed with patient: 1) Quietness 2) Tearful 3) Isolate  Coping Strategies:  1) Arts and Crafts  2) Nails and Tan 3) Go for a Drive with Early   People to help and assist with distraction:  1) Early Adams [phone] 2) Chiquita [place] [phone] 3) Bernarda Croissant [phone]  Professionals Available:  Agency:  [facility]   Emergency: Please call 911  Suicide Hotline 1-800-273-TALK 905-695-7968) Agency:  [facility]  Clinician: Evalene James   Reasons for living mentioned by the patient: Patient struggled but states her souse and daughters would hurt. States watching her great niece brings her joy. Cln. and pt completed a safety plan. Pt was informed of IOP and PHP outpatient groups. Pt accepted stating, she is interested in a higher level of care. Cln. provided information for the suicide hotline, [facility] and [facility]. Pt. reports he/she will seek out help from one of the resources or seek out support by a contact listed on her safety plan before attempting his/her plan. Pt was provided a copy of the safety plan and this has been uploaded to her file on [facility].  Summary: Patient agreed to a referral to IOP group. Reviewed  App for suicidal ideations   Phone Number to National Suicide Hotline provided to patient.    Suicidal/Homicidal: Nowithout intent/plan   Therapist Response: Clinician utilized active reflection to create a safe space for patient to process recent life experiences. Clinician assessed for current symptoms, stressors, safety since last session.   Worked with patient to them explore ways in which she can improve self-care and address more opportunities for rest.  Safety planned with the patient based on reports of suicidal ideations.  Introduced patient to resources and referrals to address safety concerns.  Readministered PHQ-9 and GAD-7 to assess for progress due to treatment plan review.  Reflected on contributing factors to heightened symptoms.    Plan: Return again in 1 weeks.   Diagnosis: MDD (major depressive disorder), recurrent, in partial remission   GAD (generalized anxiety disorder)   Insomnia, unspecified type    Collaboration of Care: [facility] psychiatrist can access notes and cln. Will review psychiatrists' notes. Check in with the patient and will see LCSW per availability. Patient  agreed with treatment recommendations.   Patient/Guardian was advised Release of Information must be obtained prior to any record release in order to collaborate their care with an outside provider. Patient/Guardian was advised if they have not already done so to contact the registration department to sign all necessary forms in order for us  to release information regarding their care.   Consent: Patient/Guardian gives verbal consent for treatment and assignment of benefits for services provided during this visit. Patient/Guardian expressed understanding and agreed to proceed.   Tracie KATHEE Husband, LCSW 08/07/2024  "

## 2024-08-08 ENCOUNTER — Ambulatory Visit: Admitting: Licensed Clinical Social Worker

## 2024-08-08 ENCOUNTER — Telehealth (HOSPITAL_COMMUNITY): Payer: Self-pay | Admitting: Psychiatry

## 2024-08-08 DIAGNOSIS — F411 Generalized anxiety disorder: Secondary | ICD-10-CM | POA: Diagnosis not present

## 2024-08-08 DIAGNOSIS — G4700 Insomnia, unspecified: Secondary | ICD-10-CM

## 2024-08-08 DIAGNOSIS — F3341 Major depressive disorder, recurrent, in partial remission: Secondary | ICD-10-CM | POA: Diagnosis not present

## 2024-08-08 NOTE — Telephone Encounter (Signed)
 D:  Evalene Husband, LCSW referred pt to virtual MH-IOP.  A:  Placed call to patient but there was no answer.  Left vm requesting pt to call the MH-IOP case mgr back.  Informed Kristina.

## 2024-08-09 ENCOUNTER — Telehealth (HOSPITAL_COMMUNITY): Payer: Self-pay | Admitting: Psychiatry

## 2024-08-09 NOTE — Telephone Encounter (Signed)
 D:  Patient returned the MH-IOP Case Manager's phone call re: group.  A:  Oriented pt.  Patient states she will f/u with her insurance company ([employer]) because she was previously told by them that they don't pay for virtual groups.  Encouraged pt to reach back out to [employer] to inquire.  If they have any further questions/concerns, can I give them your phone #?  Informed pt she could give them the case manager's phone number.  Recommended pt to give them a call and then call the cm back so she can provide her with a start date.  Inform Evalene Husband, LCSW.  R:  Pt receptive.

## 2024-08-15 NOTE — Progress Notes (Unsigned)
 "  THERAPIST PROGRESS NOTE  Session Time: 2:45-3:45pm  Participation Level: Active  Behavioral Response: CasualAlertDepressed  Type of Therapy: Individual Therapy  Treatment Goals addressed:  Active     OP Depression     LTG: Reduce frequency, intensity, and duration of depression symptoms so that daily functioning is improved (Not Progressing)     Start:  04/24/24    Expected End:  07/25/24         LTG: Increase coping skills to manage depression and improve ability to perform daily activities (Not Progressing)     Start:  04/24/24    Expected End:  07/25/24         STG: Tracie James will identify cognitive patterns and beliefs that support depression (Not Progressing)     Start:  04/24/24    Expected End:  07/25/24         LTG: Address grief related to her relationship with her daughter.  (Not Progressing)     Start:  04/24/24    Expected End:  07/25/24         Work with Tracie James to identify the major components of a recent episode of depression: physical symptoms, major thoughts and images, and major behaviors they experienced     Start:  04/24/24         Coping Skills     Start:  04/24/24       Will work with the pt using CBT/DBT techniques to help the pt verbalize an understanding of the cognitive, physiological, and behavioral components of depression and its treatment. This will be done by using worksheets, interactive activities, CBT/ABC thought logs, modeling, homework, role playing and journaling. Will work with pt to learn and implement coping skills that result in a reduction of depression and improve daily functioning per pt self-report 3 out of 5 documented sessions.       Educate patient on: Stages of grief     Start:  04/24/24              Progress Towards Goals: Progressing   Interventions: CBT reframing, supportive   Summary:  Tracie James is a 54 year old female who presents with sxs to include but not limited depressed mood, anhedonia,  fatigue, hx suicidal thoughts without plan, anxiety, Financial Extravagance, Irritable Mood, and Panic Symptoms. Per her chart, Past Psychiatric History includes a previous diagnosis of Outpatient bipolar II disorder since 80. Past admission following SI in [place]  for 3 days in 2010 and a previous suicide attempt overdosing on pills 15 years ago (did not seek medical care). Per Chart, she endorses a history of impulsive purchases around $1,000 in 1990. She denies decreased need for sleep, euphoria, talkativeness. Pt was oriented times 5. Pt was cooperative and engaged. Pt denies SI/HI/AVH.   The patient shares her mental health has shown significant improvement, as she has not experienced any suicidal thoughts since her last appointment. She reports that she stopped crying endlessly three days prior to today's session. The patient attributes her improved mental health to better sleep and self-care, particularly through hobbies like cooking and increased social interaction.   However, she also reports ongoing barriers to sleep, including chronic hip pain and the need to babysit a younger child who sleeps in her bed. The patient mentions that her racing thoughts ceased three days ago but cannot identify a specific trigger for them. She expressed feelings of emptiness, stating, I have always felt empty, and recognized moments of relief through her hobbies, though she describes these  activities as Band-Aids. She believes her feelings of emptiness stem from low self-esteem and a lack of purpose.   Reflecting on her childhood, the patient recalls worrying that other kids were making fun of her, despite having no real evidence of this. She also realizes, during discussions with the clinician, that she rarely received praise and felt her parents did not foster a healthy relationship with her.  In the next session, the clinician plans to educate the patient about attachment styles.  Suicidal/Homicidal  Assessment: The patient expresses no current intent or plan.  Therapist Response: The clinician used active reflection to create a safe environment for the patient to process recent life experiences. They assessed her current symptoms, stressors, and safety since the last session. Together, we explored the root causes of the patient's negative internal dialogue through brief inner child work and revisited generational patterns, discussing how generational trauma may have impacted her parents' ability to support her growing up.  Reflected on connections between negative thoughts and heavy feelings as a result of childhood experiences.  Plan: Return again in 1 weeks.   Diagnosis: MDD (major depressive disorder), recurrent, in partial remission   GAD (generalized anxiety disorder)   Insomnia, unspecified type     Collaboration of Care: AEB psychiatrist can access notes and cln. Will review psychiatrists' notes. Check in with the patient and will see LCSW per availability. Patient agreed with treatment recommendations.   Patient/Guardian was advised Release of Information must be obtained prior to any record release in order to collaborate their care with an outside provider. Patient/Guardian was advised if they have not already done so to contact the registration department to sign all necessary forms in order for us  to release information regarding their care.   Consent: Patient/Guardian gives verbal consent for treatment and assignment of benefits for services provided during this visit. Patient/Guardian expressed understanding and agreed to proceed.   [name], LCSW 08/15/2024  "

## 2024-08-16 ENCOUNTER — Ambulatory Visit: Admitting: Licensed Clinical Social Worker

## 2024-08-16 DIAGNOSIS — G4700 Insomnia, unspecified: Secondary | ICD-10-CM

## 2024-08-16 DIAGNOSIS — F411 Generalized anxiety disorder: Secondary | ICD-10-CM | POA: Diagnosis not present

## 2024-08-16 DIAGNOSIS — F3341 Major depressive disorder, recurrent, in partial remission: Secondary | ICD-10-CM | POA: Diagnosis not present

## 2024-08-23 ENCOUNTER — Ambulatory Visit: Admitting: Licensed Clinical Social Worker

## 2024-08-23 DIAGNOSIS — F3341 Major depressive disorder, recurrent, in partial remission: Secondary | ICD-10-CM

## 2024-08-23 DIAGNOSIS — F411 Generalized anxiety disorder: Secondary | ICD-10-CM | POA: Diagnosis not present

## 2024-08-23 DIAGNOSIS — G4700 Insomnia, unspecified: Secondary | ICD-10-CM | POA: Diagnosis not present
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
## 2024-08-24 ENCOUNTER — Encounter: Payer: Self-pay | Admitting: Psychiatry

## 2024-08-24 ENCOUNTER — Telehealth: Admitting: Psychiatry

## 2024-08-24 DIAGNOSIS — G4700 Insomnia, unspecified: Secondary | ICD-10-CM

## 2024-08-24 DIAGNOSIS — F331 Major depressive disorder, recurrent, moderate: Secondary | ICD-10-CM

## 2024-08-24 DIAGNOSIS — F411 Generalized anxiety disorder: Secondary | ICD-10-CM

## 2024-08-24 MED ORDER — LORAZEPAM 1 MG PO TABS: 1.0000 mg | ORAL_TABLET | Freq: Every day | ORAL | 1 refills | Status: AC | PRN

## 2024-08-24 MED ORDER — BUSPIRONE HCL 15 MG PO TABS: 15.0000 mg | ORAL_TABLET | Freq: Three times a day (TID) | ORAL | 0 refills | Status: AC

## 2024-08-24 MED ORDER — BREXPIPRAZOLE 0.25 MG PO TABS: 0.2500 mg | ORAL_TABLET | Freq: Every day | ORAL | 0 refills | Status: AC

## 2024-08-24 NOTE — Patient Instructions (Signed)
 Continue fluoxetine  40 mg daily  Continue bupropion  300 mg daily Start Rexulti  0.25 mg at night  Continue Buspar  15 mg three times a day  Continue hydroxyzine  25 mg twice a day as needed for anxiety Continue lorazepam  1 mg as needed for anxiety  Continue Trazodone  25-50 mg at night as needed for insomnia Next appointment - 4/6 at 3:30

## 2024-09-06 ENCOUNTER — Ambulatory Visit: Admitting: Licensed Clinical Social Worker

## 2024-09-20 ENCOUNTER — Ambulatory Visit: Admitting: Licensed Clinical Social Worker

## 2024-10-04 ENCOUNTER — Ambulatory Visit: Admitting: Licensed Clinical Social Worker

## 2024-10-08 ENCOUNTER — Ambulatory Visit: Admitting: Nurse Practitioner

## 2024-10-22 ENCOUNTER — Telehealth: Admitting: Psychiatry
